# Patient Record
Sex: Female | Born: 1955 | Race: White | Hispanic: No | Marital: Single | Smoking: Never smoker
Health system: Southern US, Community
[De-identification: ages and names within clinical notes are randomized; demographics above are authoritative.]

## PROBLEM LIST (undated history)

## (undated) DIAGNOSIS — E119 Type 2 diabetes mellitus without complications: Secondary | ICD-10-CM

## (undated) DIAGNOSIS — N200 Calculus of kidney: Secondary | ICD-10-CM

## (undated) DIAGNOSIS — K219 Gastro-esophageal reflux disease without esophagitis: Secondary | ICD-10-CM

## (undated) DIAGNOSIS — R251 Tremor, unspecified: Secondary | ICD-10-CM

## (undated) DIAGNOSIS — R413 Other amnesia: Secondary | ICD-10-CM

## (undated) DIAGNOSIS — M810 Age-related osteoporosis without current pathological fracture: Secondary | ICD-10-CM

## (undated) DIAGNOSIS — M1990 Unspecified osteoarthritis, unspecified site: Secondary | ICD-10-CM

## (undated) DIAGNOSIS — K7460 Unspecified cirrhosis of liver: Secondary | ICD-10-CM

## (undated) DIAGNOSIS — T7840XA Allergy, unspecified, initial encounter: Secondary | ICD-10-CM

## (undated) DIAGNOSIS — J449 Chronic obstructive pulmonary disease, unspecified: Secondary | ICD-10-CM

## (undated) DIAGNOSIS — G309 Alzheimer's disease, unspecified: Principal | ICD-10-CM

## (undated) DIAGNOSIS — F0280 Dementia in other diseases classified elsewhere without behavioral disturbance: Principal | ICD-10-CM

## (undated) DIAGNOSIS — R0683 Snoring: Secondary | ICD-10-CM

## (undated) DIAGNOSIS — I639 Cerebral infarction, unspecified: Secondary | ICD-10-CM

## (undated) DIAGNOSIS — Z87442 Personal history of urinary calculi: Secondary | ICD-10-CM

## (undated) DIAGNOSIS — M722 Plantar fascial fibromatosis: Secondary | ICD-10-CM

## (undated) DIAGNOSIS — E049 Nontoxic goiter, unspecified: Secondary | ICD-10-CM

## (undated) DIAGNOSIS — I10 Essential (primary) hypertension: Secondary | ICD-10-CM

## (undated) DIAGNOSIS — R42 Dizziness and giddiness: Secondary | ICD-10-CM

## (undated) DIAGNOSIS — F0150 Vascular dementia without behavioral disturbance: Secondary | ICD-10-CM

## (undated) DIAGNOSIS — G4719 Other hypersomnia: Secondary | ICD-10-CM

## (undated) HISTORY — DX: Dementia in other diseases classified elsewhere without behavioral disturbance: F02.80

## (undated) HISTORY — DX: Vascular dementia without behavioral disturbance: F01.50

## (undated) HISTORY — DX: Age-related osteoporosis without current pathological fracture: M81.0

## (undated) HISTORY — DX: Allergy, unspecified, initial encounter: T78.40XA

## (undated) HISTORY — PX: BLADDER SURGERY: SHX569

## (undated) HISTORY — DX: Other amnesia: R41.3

## (undated) HISTORY — DX: Alzheimer's disease, unspecified: G30.9

## (undated) HISTORY — PX: APPENDECTOMY: SHX54

## (undated) HISTORY — DX: Plantar fascial fibromatosis: M72.2

## (undated) HISTORY — DX: Nontoxic goiter, unspecified: E04.9

## (undated) HISTORY — DX: Unspecified osteoarthritis, unspecified site: M19.90

## (undated) HISTORY — PX: CHOLECYSTECTOMY: SHX55

## (undated) HISTORY — PX: TOTAL ABDOMINAL HYSTERECTOMY: SHX209

## (undated) HISTORY — DX: Other hypersomnia: G47.19

## (undated) HISTORY — PX: FOOT SURGERY: SHX648

## (undated) HISTORY — DX: Gastro-esophageal reflux disease without esophagitis: K21.9

## (undated) HISTORY — DX: Dizziness and giddiness: R42

## (undated) HISTORY — DX: Snoring: R06.83

## (undated) HISTORY — DX: Tremor, unspecified: R25.1

---

## 1998-01-16 ENCOUNTER — Encounter: Admission: RE | Admit: 1998-01-16 | Discharge: 1998-01-16 | Payer: Self-pay | Admitting: Family Medicine

## 1998-01-23 ENCOUNTER — Encounter: Admission: RE | Admit: 1998-01-23 | Discharge: 1998-01-23 | Payer: Self-pay | Admitting: Family Medicine

## 1998-04-03 ENCOUNTER — Other Ambulatory Visit: Admission: RE | Admit: 1998-04-03 | Discharge: 1998-04-03 | Payer: Self-pay | Admitting: Family Medicine

## 1998-04-03 ENCOUNTER — Encounter: Admission: RE | Admit: 1998-04-03 | Discharge: 1998-04-03 | Payer: Self-pay | Admitting: Family Medicine

## 1999-05-21 ENCOUNTER — Encounter: Admission: RE | Admit: 1999-05-21 | Discharge: 1999-05-21 | Payer: Self-pay | Admitting: Sports Medicine

## 1999-05-22 ENCOUNTER — Ambulatory Visit (HOSPITAL_COMMUNITY): Admission: RE | Admit: 1999-05-22 | Discharge: 1999-05-22 | Payer: Self-pay | Admitting: Family Medicine

## 1999-06-04 ENCOUNTER — Encounter: Admission: RE | Admit: 1999-06-04 | Discharge: 1999-06-04 | Payer: Self-pay | Admitting: Family Medicine

## 1999-07-03 ENCOUNTER — Encounter: Admission: RE | Admit: 1999-07-03 | Discharge: 1999-07-03 | Payer: Self-pay | Admitting: Sports Medicine

## 1999-07-03 ENCOUNTER — Encounter: Payer: Self-pay | Admitting: Sports Medicine

## 1999-07-12 ENCOUNTER — Encounter: Payer: Self-pay | Admitting: Sports Medicine

## 1999-07-12 ENCOUNTER — Encounter: Admission: RE | Admit: 1999-07-12 | Discharge: 1999-07-12 | Payer: Self-pay | Admitting: Sports Medicine

## 1999-07-30 ENCOUNTER — Encounter: Admission: RE | Admit: 1999-07-30 | Discharge: 1999-07-30 | Payer: Self-pay | Admitting: Family Medicine

## 1999-09-17 ENCOUNTER — Encounter: Admission: RE | Admit: 1999-09-17 | Discharge: 1999-09-17 | Payer: Self-pay | Admitting: Sports Medicine

## 1999-10-08 ENCOUNTER — Encounter: Admission: RE | Admit: 1999-10-08 | Discharge: 1999-10-08 | Payer: Self-pay | Admitting: Family Medicine

## 1999-10-08 ENCOUNTER — Other Ambulatory Visit: Admission: RE | Admit: 1999-10-08 | Discharge: 1999-10-08 | Payer: Self-pay | Admitting: Family Medicine

## 1999-10-15 ENCOUNTER — Encounter: Admission: RE | Admit: 1999-10-15 | Discharge: 1999-10-15 | Payer: Self-pay | Admitting: Sports Medicine

## 2000-01-27 ENCOUNTER — Emergency Department (HOSPITAL_COMMUNITY): Admission: EM | Admit: 2000-01-27 | Discharge: 2000-01-27 | Payer: Self-pay | Admitting: Emergency Medicine

## 2000-01-27 ENCOUNTER — Encounter: Payer: Self-pay | Admitting: Emergency Medicine

## 2000-01-28 ENCOUNTER — Encounter: Admission: RE | Admit: 2000-01-28 | Discharge: 2000-01-28 | Payer: Self-pay | Admitting: Family Medicine

## 2000-01-28 ENCOUNTER — Ambulatory Visit: Admission: RE | Admit: 2000-01-28 | Discharge: 2000-01-28 | Payer: Self-pay | Admitting: Family Medicine

## 2000-03-03 ENCOUNTER — Encounter: Admission: RE | Admit: 2000-03-03 | Discharge: 2000-03-03 | Payer: Self-pay | Admitting: Family Medicine

## 2000-03-10 ENCOUNTER — Encounter: Admission: RE | Admit: 2000-03-10 | Discharge: 2000-03-10 | Payer: Self-pay | Admitting: Family Medicine

## 2000-03-10 ENCOUNTER — Encounter: Payer: Self-pay | Admitting: Family Medicine

## 2000-04-25 ENCOUNTER — Emergency Department (HOSPITAL_COMMUNITY): Admission: EM | Admit: 2000-04-25 | Discharge: 2000-04-25 | Payer: Self-pay | Admitting: Emergency Medicine

## 2000-07-24 ENCOUNTER — Encounter: Admission: RE | Admit: 2000-07-24 | Discharge: 2000-07-24 | Payer: Self-pay | Admitting: Family Medicine

## 2000-08-23 ENCOUNTER — Emergency Department (HOSPITAL_COMMUNITY): Admission: EM | Admit: 2000-08-23 | Discharge: 2000-08-23 | Payer: Self-pay | Admitting: Emergency Medicine

## 2000-08-25 ENCOUNTER — Encounter: Admission: RE | Admit: 2000-08-25 | Discharge: 2000-08-25 | Payer: Self-pay | Admitting: Sports Medicine

## 2000-09-08 ENCOUNTER — Encounter: Admission: RE | Admit: 2000-09-08 | Discharge: 2000-09-08 | Payer: Self-pay | Admitting: *Deleted

## 2000-09-08 ENCOUNTER — Encounter: Admission: RE | Admit: 2000-09-08 | Discharge: 2000-09-08 | Payer: Self-pay | Admitting: Sports Medicine

## 2001-01-01 ENCOUNTER — Encounter: Admission: RE | Admit: 2001-01-01 | Discharge: 2001-01-01 | Payer: Self-pay | Admitting: Family Medicine

## 2001-02-10 ENCOUNTER — Encounter: Payer: Self-pay | Admitting: General Surgery

## 2001-02-10 ENCOUNTER — Encounter: Admission: RE | Admit: 2001-02-10 | Discharge: 2001-02-10 | Payer: Self-pay | Admitting: General Surgery

## 2001-02-23 ENCOUNTER — Encounter: Payer: Self-pay | Admitting: Family Medicine

## 2001-02-23 ENCOUNTER — Encounter: Admission: RE | Admit: 2001-02-23 | Discharge: 2001-02-23 | Payer: Self-pay | Admitting: Sports Medicine

## 2001-03-01 ENCOUNTER — Encounter: Payer: Self-pay | Admitting: Family Medicine

## 2001-03-01 ENCOUNTER — Encounter: Admission: RE | Admit: 2001-03-01 | Discharge: 2001-03-01 | Payer: Self-pay | Admitting: Family Medicine

## 2001-03-20 ENCOUNTER — Emergency Department (HOSPITAL_COMMUNITY): Admission: EM | Admit: 2001-03-20 | Discharge: 2001-03-20 | Payer: Self-pay | Admitting: *Deleted

## 2001-04-21 ENCOUNTER — Emergency Department (HOSPITAL_COMMUNITY): Admission: EM | Admit: 2001-04-21 | Discharge: 2001-04-21 | Payer: Self-pay | Admitting: *Deleted

## 2001-08-10 ENCOUNTER — Encounter: Admission: RE | Admit: 2001-08-10 | Discharge: 2001-08-10 | Payer: Self-pay | Admitting: Family Medicine

## 2001-09-15 ENCOUNTER — Emergency Department (HOSPITAL_COMMUNITY): Admission: EM | Admit: 2001-09-15 | Discharge: 2001-09-16 | Payer: Self-pay | Admitting: Emergency Medicine

## 2001-09-16 ENCOUNTER — Encounter: Payer: Self-pay | Admitting: Emergency Medicine

## 2001-10-13 ENCOUNTER — Emergency Department (HOSPITAL_COMMUNITY): Admission: EM | Admit: 2001-10-13 | Discharge: 2001-10-13 | Payer: Self-pay | Admitting: Emergency Medicine

## 2001-10-13 ENCOUNTER — Encounter: Payer: Self-pay | Admitting: Emergency Medicine

## 2002-04-20 ENCOUNTER — Emergency Department (HOSPITAL_COMMUNITY): Admission: EM | Admit: 2002-04-20 | Discharge: 2002-04-20 | Payer: Self-pay | Admitting: *Deleted

## 2002-04-20 ENCOUNTER — Encounter: Payer: Self-pay | Admitting: *Deleted

## 2002-04-20 ENCOUNTER — Ambulatory Visit (HOSPITAL_COMMUNITY): Admission: RE | Admit: 2002-04-20 | Discharge: 2002-04-20 | Payer: Self-pay | Admitting: *Deleted

## 2003-01-17 ENCOUNTER — Encounter: Admission: RE | Admit: 2003-01-17 | Discharge: 2003-01-17 | Payer: Self-pay | Admitting: Family Medicine

## 2003-05-18 ENCOUNTER — Encounter: Admission: RE | Admit: 2003-05-18 | Discharge: 2003-05-18 | Payer: Self-pay | Admitting: Family Medicine

## 2003-05-19 ENCOUNTER — Emergency Department (HOSPITAL_COMMUNITY): Admission: EM | Admit: 2003-05-19 | Discharge: 2003-05-20 | Payer: Self-pay | Admitting: Emergency Medicine

## 2004-03-04 ENCOUNTER — Inpatient Hospital Stay (HOSPITAL_COMMUNITY): Admission: EM | Admit: 2004-03-04 | Discharge: 2004-03-06 | Payer: Self-pay | Admitting: Emergency Medicine

## 2004-03-04 ENCOUNTER — Ambulatory Visit: Payer: Self-pay | Admitting: Family Medicine

## 2004-09-20 ENCOUNTER — Inpatient Hospital Stay (HOSPITAL_COMMUNITY): Admission: RE | Admit: 2004-09-20 | Discharge: 2004-09-22 | Payer: Self-pay | Admitting: Gynecology

## 2004-11-19 ENCOUNTER — Ambulatory Visit: Payer: Self-pay | Admitting: Family Medicine

## 2004-12-02 ENCOUNTER — Encounter: Admission: RE | Admit: 2004-12-02 | Discharge: 2004-12-02 | Payer: Self-pay | Admitting: Family Medicine

## 2005-03-18 ENCOUNTER — Emergency Department (HOSPITAL_COMMUNITY): Admission: EM | Admit: 2005-03-18 | Discharge: 2005-03-18 | Payer: Self-pay | Admitting: Emergency Medicine

## 2005-03-26 ENCOUNTER — Encounter (INDEPENDENT_AMBULATORY_CARE_PROVIDER_SITE_OTHER): Payer: Self-pay | Admitting: *Deleted

## 2005-03-26 LAB — CONVERTED CEMR LAB

## 2005-04-10 ENCOUNTER — Ambulatory Visit: Payer: Self-pay | Admitting: Family Medicine

## 2005-08-14 ENCOUNTER — Ambulatory Visit: Payer: Self-pay | Admitting: Family Medicine

## 2005-08-17 ENCOUNTER — Emergency Department (HOSPITAL_COMMUNITY): Admission: EM | Admit: 2005-08-17 | Discharge: 2005-08-17 | Payer: Self-pay | Admitting: Emergency Medicine

## 2005-08-18 ENCOUNTER — Encounter: Admission: RE | Admit: 2005-08-18 | Discharge: 2005-08-18 | Payer: Self-pay | Admitting: Family Medicine

## 2005-08-29 ENCOUNTER — Encounter: Admission: RE | Admit: 2005-08-29 | Discharge: 2005-08-29 | Payer: Self-pay | Admitting: General Surgery

## 2005-09-05 ENCOUNTER — Encounter: Admission: RE | Admit: 2005-09-05 | Discharge: 2005-09-05 | Payer: Self-pay | Admitting: General Surgery

## 2005-09-29 ENCOUNTER — Ambulatory Visit (HOSPITAL_COMMUNITY): Admission: RE | Admit: 2005-09-29 | Discharge: 2005-09-30 | Payer: Self-pay | Admitting: General Surgery

## 2005-09-29 ENCOUNTER — Encounter (INDEPENDENT_AMBULATORY_CARE_PROVIDER_SITE_OTHER): Payer: Self-pay | Admitting: *Deleted

## 2005-11-27 ENCOUNTER — Ambulatory Visit: Payer: Self-pay | Admitting: Family Medicine

## 2005-12-09 ENCOUNTER — Encounter: Admission: RE | Admit: 2005-12-09 | Discharge: 2005-12-12 | Payer: Self-pay | Admitting: Family Medicine

## 2006-06-25 ENCOUNTER — Ambulatory Visit: Payer: Self-pay | Admitting: Family Medicine

## 2006-09-17 DIAGNOSIS — E010 Iodine-deficiency related diffuse (endemic) goiter: Secondary | ICD-10-CM | POA: Insufficient documentation

## 2006-09-17 DIAGNOSIS — K219 Gastro-esophageal reflux disease without esophagitis: Secondary | ICD-10-CM | POA: Insufficient documentation

## 2006-09-17 DIAGNOSIS — E049 Nontoxic goiter, unspecified: Secondary | ICD-10-CM | POA: Insufficient documentation

## 2006-09-18 ENCOUNTER — Encounter (INDEPENDENT_AMBULATORY_CARE_PROVIDER_SITE_OTHER): Payer: Self-pay | Admitting: *Deleted

## 2007-01-01 ENCOUNTER — Encounter: Payer: Self-pay | Admitting: Family Medicine

## 2007-01-01 ENCOUNTER — Ambulatory Visit: Payer: Self-pay | Admitting: Family Medicine

## 2007-07-01 ENCOUNTER — Telehealth: Payer: Self-pay | Admitting: Family Medicine

## 2007-11-26 ENCOUNTER — Encounter: Payer: Self-pay | Admitting: Family Medicine

## 2007-11-26 ENCOUNTER — Ambulatory Visit: Payer: Self-pay | Admitting: Family Medicine

## 2007-11-26 LAB — CONVERTED CEMR LAB
Chloride: 105 meq/L (ref 96–112)
Creatinine, Ser: 0.73 mg/dL (ref 0.40–1.20)

## 2009-02-13 ENCOUNTER — Encounter: Payer: Self-pay | Admitting: Family Medicine

## 2009-04-03 ENCOUNTER — Encounter (INDEPENDENT_AMBULATORY_CARE_PROVIDER_SITE_OTHER): Payer: Self-pay | Admitting: *Deleted

## 2009-08-10 ENCOUNTER — Encounter: Payer: Self-pay | Admitting: Family Medicine

## 2009-08-10 ENCOUNTER — Ambulatory Visit: Payer: Self-pay | Admitting: Family Medicine

## 2009-08-10 LAB — CONVERTED CEMR LAB
ALT: 14 units/L (ref 0–35)
AST: 14 units/L (ref 0–37)
Albumin: 4.3 g/dL (ref 3.5–5.2)
BUN: 14 mg/dL (ref 6–23)
Calcium: 8.8 mg/dL (ref 8.4–10.5)
Chloride: 103 meq/L (ref 96–112)
Potassium: 4 meq/L (ref 3.5–5.3)
Sodium: 141 meq/L (ref 135–145)
Total Protein: 7.4 g/dL (ref 6.0–8.3)

## 2009-08-13 ENCOUNTER — Encounter (INDEPENDENT_AMBULATORY_CARE_PROVIDER_SITE_OTHER): Payer: Self-pay | Admitting: *Deleted

## 2009-08-13 ENCOUNTER — Encounter: Payer: Self-pay | Admitting: Family Medicine

## 2009-11-26 ENCOUNTER — Encounter: Payer: Self-pay | Admitting: Family Medicine

## 2010-01-02 ENCOUNTER — Telehealth: Payer: Self-pay | Admitting: Family Medicine

## 2010-02-17 ENCOUNTER — Emergency Department (HOSPITAL_COMMUNITY): Admission: EM | Admit: 2010-02-17 | Discharge: 2010-02-17 | Payer: Self-pay | Admitting: Emergency Medicine

## 2010-02-25 ENCOUNTER — Telehealth: Payer: Self-pay | Admitting: *Deleted

## 2010-03-04 ENCOUNTER — Telehealth: Payer: Self-pay | Admitting: Family Medicine

## 2010-03-07 ENCOUNTER — Telehealth: Payer: Self-pay | Admitting: Family Medicine

## 2010-04-26 ENCOUNTER — Encounter: Payer: Self-pay | Admitting: *Deleted

## 2010-04-26 ENCOUNTER — Ambulatory Visit: Payer: Self-pay | Admitting: Family Medicine

## 2010-04-26 DIAGNOSIS — G894 Chronic pain syndrome: Secondary | ICD-10-CM | POA: Insufficient documentation

## 2010-04-26 LAB — CONVERTED CEMR LAB
Glucose, Urine, Semiquant: NEGATIVE
Nitrite: NEGATIVE
Protein, U semiquant: NEGATIVE
Urobilinogen, UA: 0.2
WBC Urine, dipstick: NEGATIVE

## 2010-05-07 ENCOUNTER — Encounter: Payer: Self-pay | Admitting: Family Medicine

## 2010-05-07 LAB — CONVERTED CEMR LAB
OCCULT 1: NEGATIVE
OCCULT 2: NEGATIVE
OCCULT 3: NEGATIVE

## 2010-05-17 ENCOUNTER — Ambulatory Visit: Payer: Self-pay | Admitting: Family Medicine

## 2010-05-17 DIAGNOSIS — M47816 Spondylosis without myelopathy or radiculopathy, lumbar region: Secondary | ICD-10-CM | POA: Insufficient documentation

## 2010-05-17 DIAGNOSIS — N952 Postmenopausal atrophic vaginitis: Secondary | ICD-10-CM | POA: Insufficient documentation

## 2010-06-04 ENCOUNTER — Encounter: Payer: Self-pay | Admitting: Family Medicine

## 2010-06-04 ENCOUNTER — Encounter: Admission: RE | Admit: 2010-06-04 | Discharge: 2010-06-04 | Payer: Self-pay | Admitting: Family Medicine

## 2010-06-10 ENCOUNTER — Encounter: Payer: Self-pay | Admitting: Family Medicine

## 2010-06-10 DIAGNOSIS — N812 Incomplete uterovaginal prolapse: Secondary | ICD-10-CM | POA: Insufficient documentation

## 2010-06-10 DIAGNOSIS — N312 Flaccid neuropathic bladder, not elsewhere classified: Secondary | ICD-10-CM | POA: Insufficient documentation

## 2010-06-10 HISTORY — DX: Incomplete uterovaginal prolapse: N81.2

## 2010-06-11 ENCOUNTER — Encounter: Payer: Self-pay | Admitting: Family Medicine

## 2010-08-13 ENCOUNTER — Telehealth: Payer: Self-pay | Admitting: Family Medicine

## 2010-08-20 NOTE — Progress Notes (Signed)
Summary: refill  Phone Note Refill Request Call back at Home Phone [phone] Message from:  Patient  Refills Requested: Medication #1:  NEURONTIN 800 MG TABS Take 1 tablet by mouth three times a day [BMN] [facility] [address] [facility] [place]  Initial call taken by: De Nurse,  January 02, 2010 8:46 AM Caller: Patient Initial call taken by: De Nurse,  January 02, 2010 8:45 AM    Prescriptions: NEURONTIN 800 MG TABS (GABAPENTIN) Take 1 tablet by mouth three times a day  #90 x 6   Entered and Authorized by:   Luretha Murphy NP   Signed by:   Luretha Murphy NP on 01/02/2010   Method used:   Electronically to        [facility]  [address] [address] [phone][facility] (retail)       [address]       [place], [place]  [address]       Ph: [phone] or [phone]       Fax: [phone]   RxID:   [id number]

## 2010-08-20 NOTE — Letter (Signed)
Summary: Generic Letter  [facility]  [address]   [place], [place] [address]   Phone: [phone]  Fax: [phone]    06/11/2010  DENESE MENTINK [address] [place], [place]  [address]  Dear Ms. Heron,  Your bone density scan showed thinning of your hip bone that is considered osteopenia.  We will need to follow this every 2 years.  Please make sure you are taking a calcium and vitamin D supplement, two times per day.  Continue to walk for exerecise.         Sincerely,   Luretha Murphy NP  Appended Document: Generic Letter letter mailed

## 2010-08-20 NOTE — Letter (Signed)
Summary: Generic Letter/referral follow up  [facility]  [address]   [place], [place] [address]   Phone: [phone]  Fax: [phone]    11/26/2009  [name] [address] [place], [place]  [address]  Dear Ms. Vanmaanen,   I was reviewing my referrals and noted that you did not go for the colonoscopy visit.  This is to inform you that I beleive this is an important screening procedure and is recommended at age 52. This test is to look for early colon cancer.  If you chose not to have the procedure then me must at least check your stool for blood every year.  We will discuss at your next visit.        Sincerely,   Luretha Murphy NP  Appended Document: Generic Letter/referral follow up mailed.

## 2010-08-20 NOTE — Assessment & Plan Note (Signed)
Summary: bladder problem,tcb   Vital Signs:  Patient profile:   55 year old female Weight:      215 pounds Temp:     97.8 degrees F oral Pulse rate:   90 / minute Pulse rhythm:   regular BP sitting:   143 / 89  (left arm) Cuff size:   large  Vitals Entered By: Arlyss Repress CMA, (April 26, 2010 4:21 PM)   History of Present Illness: [name] feels that her bladder has fallen again, she had 2 repairs for cystocele.  She describes pain and feeling of pressure.  She is unable to have sex. (TAH with BSO, never on hormone replacement)  She also talks about "chronic pain' that has been a problem for years, we have treated wtih gabapentin and tramadol.  She does not feel that they are effective and would like something else.  Her back hurts, her head hurts, her arms hurt, her legs hurt.  Mostly at the end of a work day.  Her sleep patterns are interrupted.  Bowels move once a week and it is difficult.  Habits & Providers  Alcohol-Tobacco-Diet     Tobacco Status: never  Current Medications (verified): 1)  Flexeril 10 Mg  Tabs (Cyclobenzaprine Hcl) .... Three Times A Day As Needed 2)  Neurontin 800 Mg Tabs (Gabapentin) .... Take 1 Tablet By Mouth Three Times A Day 3)  Omeprazole 40 Mg Cpdr (Omeprazole) .... Take One Tablet Daily At Bedtime 4)  Buffered Aspirin 325 Mg  Tabs (Aspirin Buf(Cacarb-Mgcarb-Mgo)) .... Daily 5)  Tramadol Hcl 50 Mg Tabs (Tramadol Hcl) .... Two Tabs Two Times A Day As Needed Pain 6)  Meloxicam 15 Mg Tabs (Meloxicam) .... One Daily As Needed (Take in Bursts So When The Pain Is Bad Take For 3-5 Days Then Take A Break) 7)  Polyethylene Glycol 3350  Powd (Polyethylene Glycol 3350) .Marland KitchenMarland KitchenMarland Kitchen 17 Grams in Water Daily To Prevent Constipation, Qs  Allergies (verified): No Known Drug Allergies  Review of Systems      See HPI General:  Complains of malaise and sleep disorder; denies fatigue and fever. CV:  Denies chest pain or discomfort and swelling of feet. GI:  Complains of  constipation, gas, and indigestion. GU:  Complains of dysuria. MS:  Denies joint pain, low back pain, and mid back pain.  Physical Exam  General:  Anxious, red in the face, talking rapidly Lungs:  normal respiratory effort and normal breath sounds.   Heart:  normal rate, regular rhythm, and no murmur.   Abdomen:  soft and non-tender.   Genitalia:  recessed pelvic floow, thin friable tissue, able to palpate bladder through vaginal os. Msk:  normal ROM, no joint tenderness, no joint swelling, and no joint warmth.   Skin:  flushed face Psych:  moderately anxious.     Impression & Recommendations:  Problem # 1:  DYSURIA (ICD-788.1) normal UA, 2 cystocele repairs consider pessary, tissue is very thin and weak, do not think she can undergo another surgery Orders: Urinalysis-[facility] (00000) [facility]- Est  Level 4 ([address])  Problem # 2:  CHRONIC PAIN SYNDROME (ICD-338.4) increase tramadol, add NSAID in bursts, add acetaminophen Orders: [facility]- Est  Level 4 ([address])  Problem # 3:  CONSTIPATION (ICD-564.00)  Her updated medication list for this problem includes:    Polyethylene Glycol 3350 Powd (Polyethylene glycol 3350) .Marland KitchenMarland KitchenMarland KitchenMarland Kitchen 17 grams in water daily to prevent constipation, qs  Orders: [facility]- Est  Level 4 ([address])  Problem # 4:  GASTROESOPHAGEAL REFLUX, NO  ESOPHAGITIS (ICD-530.81) Have been having problems with insurance company covering PPI, call and recived PA for one year. Her updated medication list for this problem includes:    Omeprazole 40 Mg Cpdr (Omeprazole) .Marland Kitchen... Take one tablet daily at bedtime  Orders: [facility]- Est  Level 4 ([address])  Complete Medication List: 1)  Flexeril 10 Mg Tabs (Cyclobenzaprine hcl) .... Three times a day as needed 2)  Neurontin 800 Mg Tabs (Gabapentin) .... Take 1 tablet by mouth three times a day 3)  Omeprazole 40 Mg Cpdr (Omeprazole) .... Take one tablet daily at bedtime 4)  Buffered Aspirin 325 Mg Tabs (Aspirin buf(cacarb-mgcarb-mgo)) .... Daily 5)  Tramadol  Hcl 50 Mg Tabs (Tramadol hcl) .... Two tabs two times a day as needed pain 6)  Meloxicam 15 Mg Tabs (Meloxicam) .... One daily as needed (take in bursts so when the pain is bad take for 3-5 days then take a break) 7)  Polyethylene Glycol 3350 Powd (Polyethylene glycol 3350) .Marland KitchenMarland KitchenMarland Kitchen 17 grams in water daily to prevent constipation, qs  Patient Instructions: 1)  Take tyenol 2 tabs with 2 tabs of tramadol two times a day  2)  Take meloxicam for arthritis in bursts 3)  Pessary=GYN tell them you had(surgery twice for bladder prolapse and interested in a pessary as you feel your bladder falling again) 4)  Polyethylene glycol for consitpation 5)  clotrimazole for rash in skin folds 6)  Mammogram and stool slides for blood are due 7)  Call insurance company about colonoscopy payment Prescriptions: POLYETHYLENE GLYCOL 3350  POWD (POLYETHYLENE GLYCOL 3350) 17 grams in water daily to prevent constipation, QS Brand medically necessary #1 x 3   Entered and Authorized by:   Luretha Murphy NP   Signed by:   Luretha Murphy NP on 04/26/2010   Method used:   Print then Give to Patient   RxID:   [id number] NEURONTIN 800 MG TABS (GABAPENTIN) Take 1 tablet by mouth three times a day Brand medically necessary #90 x 6   Entered and Authorized by:   Luretha Murphy NP   Signed by:   Luretha Murphy NP on 04/26/2010   Method used:   Print then Give to Patient   RxID:   [id number] FLEXERIL 10 MG  TABS (CYCLOBENZAPRINE HCL) three times a day as needed Brand medically necessary #90 x 3   Entered and Authorized by:   Luretha Murphy NP   Signed by:   Luretha Murphy NP on 04/26/2010   Method used:   Print then Give to Patient   RxID:   [id number] OMEPRAZOLE 40 MG CPDR (OMEPRAZOLE) Take one tablet daily at bedtime Brand medically necessary #30 x 6   Entered and Authorized by:   Luretha Murphy NP   Signed by:   Luretha Murphy NP on 04/26/2010   Method used:   Print then Give to Patient   RxID:   [id number] MELOXICAM 15 MG  TABS (MELOXICAM) one daily as needed (take in bursts so when the pain is bad take for 3-5 days then take a break) Brand medically necessary #30 x 3   Entered and Authorized by:   Luretha Murphy NP   Signed by:   Luretha Murphy NP on 04/26/2010   Method used:   Print then Give to Patient   RxID:   [id number] TRAMADOL HCL 50 MG TABS (TRAMADOL HCL) two tabs two times a day as needed pain Brand medically necessary #120 x 3   Entered and Authorized by:   Luretha Murphy  NP   Signed by:   Luretha Murphy NP on 04/26/2010   Method used:   Print then Give to Patient   RxID:   [id number]    Prevention & Chronic Care Immunizations   Influenza vaccine: Not documented   Influenza vaccine deferral: Refused  (08/10/2009)    Tetanus booster: 12/20/2002: Done.    Pneumococcal vaccine: Not documented  Colorectal Screening   Hemoccult: Not documented   Hemoccult action/deferral: Not indicated  (08/10/2009)    Colonoscopy: Not documented   Colonoscopy action/deferral: GI referral  (08/10/2009)  Other Screening   Pap smear: Done.  (03/26/2005)   Pap smear action/deferral: Not indicated S/P hysterectomy  (02/13/2009)    Mammogram: Done.  (11/23/2004)   Mammogram action/deferral: Ordered  (08/10/2009)   Smoking status: never  (04/26/2010)  Lipids   Total Cholesterol: Not documented   LDL: Not documented   LDL Direct: 103  (08/10/2009)   HDL: Not documented   Triglycerides: Not documented  Laboratory Results   Urine Tests  Date/Time Received: April 26, 2010 4:24 PM  Date/Time Reported: April 26, 2010 4:44 PM   Routine Urinalysis   Color: yellow Appearance: Clear Glucose: negative   (Normal Range: Negative) Bilirubin: small;  reflex ictotest = negative   (Normal Range: Negative) Ketone: trace (5)   (Normal Range: Negative) Spec. Gravity: >=1.030   (Normal Range: 1.003-1.035) Blood: trace-intact   (Normal Range: Negative) pH: 5.5   (Normal Range: 5.0-8.0) Protein: negative    (Normal Range: Negative) Urobilinogen: 0.2   (Normal Range: 0-1) Nitrite: negative   (Normal Range: Negative) Leukocyte Esterace: negative   (Normal Range: Negative)  Urine Microscopic WBC/HPF: 4-8 Bacteria/HPF: 2+ Mucous/HPF: 3+ Epithelial/HPF: 10-20 Crystals/HPF: calcium oxalate    Comments: ...............test performed by......Marland KitchenBonnie A. [place], MLS (ASCP)cm

## 2010-08-20 NOTE — Progress Notes (Signed)
Summary: re: meds/ts  Phone Note Call from Patient Call back at [facility] Phone [phone]   Caller: Patient Summary of Call: having problems with Omeprizole getting PA   [facility] [place] Initial call taken by: De Nurse,  February 25, 2010 4:29 PM  Follow-up for Phone Call        called pt advised pt to call her insurance co. and find out, which meds would be covered through her plan and then call us back. pt agreed and will do that.   Follow-up by: Arlyss Repress CMA,,  February 25, 2010 4:43 PM

## 2010-08-20 NOTE — Progress Notes (Signed)
Summary: phn msg  Phone Note Call from Patient Call back at [phone]   Caller: Patient Summary of Call: pt states that her ins co stated that the doctor has to call to precertify in order for them to approve  the omeprozol (pt is having a very hard time getting this medicine) pls call [phone]  Initial call taken by: De Nurse,  March 07, 2010 1:59 PM  Follow-up for Phone Call        called [employer]. they require a prior auth on all PPIs. the prior auth form is in pcp chart box Follow-up by: Golden Circle RN,  March 07, 2010 4:49 PM  Additional Follow-up for Phone Call Additional follow up Details #1::        I'll pass this on to [place]. I believe the high dose requires a history of of gastric ulcer which I didn't see. The 20 mg dose is available without an rx Additional Follow-up by: Zachery Dauer MD,  March 07, 2010 6:03 PM     Appended Document: phn msg I have resent the PA form in 3 times and have lowered the dosage to 20 mg as indicated for the treatment of GERD.  If her insurance still refuses she can purchase OTC or switch to an H2 blocker that is less costly.

## 2010-08-20 NOTE — Assessment & Plan Note (Signed)
Summary: cpe,df   Vital Signs:  Patient profile:   55 year old female Height:      65 inches Weight:      228 pounds BMI:     38.08 Temp:     97.8 degrees F oral Pulse rate:   84 / minute BP sitting:   130 / 80  (right arm) Cuff size:   large  Vitals Entered By: Tessie Fass CMA (August 10, 2009 2:50 PM) CC: complete physical Is Patient Diabetic? No   CC:  complete physical.  History of Present Illness: Has not been in for over one year.  Still having ongoing chronic pain involving back, legs and feet.  Feels that the neurotin is helping her to cope with the pain but not take it away.  Often so uncomfortable that it takes several hours for her to fall asleep.  She is still working full time, wonders how long she can keep it up.  Gets real tired, worries about diabetes.  Feels as if her hearing is poor, cannot hear well out of her right ear and her ear is tender.  Has bad indigestion, after eating and at night, has not been taking her PPI.  Discussed prevention PCMH areas.  Habits & Providers  Alcohol-Tobacco-Diet     Tobacco Status: never  Current Medications (verified): 1)  Flexeril 10 Mg  Tabs (Cyclobenzaprine Hcl) .... Three Times A Day As Needed 2)  Neurontin 800 Mg Tabs (Gabapentin) .... Take 1 Tablet By Mouth Three Times A Day 3)  Omeprazole 20 Mg Cpdr (Omeprazole) .... Take 1 Capsule By Mouth Once A Day 4)  Buffered Aspirin 325 Mg  Tabs (Aspirin Buf(Cacarb-Mgcarb-Mgo)) .... Daily 5)  Tramadol Hcl 50 Mg Tabs (Tramadol Hcl) .... One To Two Tabs Two Times A Day As Needed Pain  Allergies (verified): No Known Drug Allergies  Social History: Smoking Status:  never  Review of Systems General:  Denies fatigue, malaise, and sleep disorder. ENT:  Complains of earache. CV:  Denies chest pain or discomfort. GI:  Denies constipation. GU:  Denies discharge, dysuria, and urinary frequency. MS:  Complains of joint pain, low back pain, and cramps.  Physical  Exam  General:  Alert, obese Ears:  Right canal tender with a little crusting, normal TM.  Audiometer: heard all frequencies except 4000 on the left on 25db Mouth:  poor dentition, missing teeth, ones there are in poor condition Lungs:  normal respiratory effort and normal breath sounds.   Heart:  normal rate and regular rhythm.   Abdomen:  soft and normal bowel sounds.   Msk:  normal ROM.   Extremities:  1+ left pedal edema and 1+ right pedal edema.   Callouses at the fifth mt heads bilaterally Psych:  normally interactive.     Impression & Recommendations:  Problem # 1:  SCREENING, COLON CANCER (ICD-V76.51)  Orders: Gastroenterology Referral (GI)  Problem # 2:  SCREENING OTHER&UNSPEC CARDIOVASCULAR CONDITIONS (ICD-V81.2)  Orders: Direct LDL-[facility] ([address]) [facility]- Est  Level 4 ([address])  Problem # 3:  FATIGUE (ICD-780.79) Check hepatic, renal, and glucose.   Orders: Comp Met-[facility] [phone]) TSH-[facility] ([address]) [facility]- Est  Level 4 ([address])  Problem # 4:  PAIN, GENERALIZED (ICD-780.9)  Add tramadol to gabapentin and flexeril  Orders: [facility]- Est  Level 4 ([address])  Problem # 5:  GASTROESOPHAGEAL REFLUX, NO ESOPHAGITIS (ICD-530.81)  Her updated medication list for this problem includes:    Omeprazole 20 Mg Cpdr (Omeprazole) .Marland Kitchen... Take 1 capsule by mouth once  a day  Problem # 6:  OBESITY, NOS (ICD-278.00) Discussed exercise.  Complete Medication List: 1)  Flexeril 10 Mg Tabs (Cyclobenzaprine hcl) .... Three times a day as needed 2)  Neurontin 800 Mg Tabs (Gabapentin) .... Take 1 tablet by mouth three times a day 3)  Omeprazole 20 Mg Cpdr (Omeprazole) .... Take 1 capsule by mouth once a day 4)  Buffered Aspirin 325 Mg Tabs (Aspirin buf(cacarb-mgcarb-mgo)) .... Daily 5)  Tramadol Hcl 50 Mg Tabs (Tramadol hcl) .... One to two tabs two times a day as needed pain  Patient Instructions: 1)  Podiatry for foot pain 2)  Tramadol for generalized aches and pains, may use with  Tylenol or Ibuprofen. 3)  Consider audiology for hearing loss 4)  We will call you with your GI referral 5)  Please make your apt for your mammogram Prescriptions: OMEPRAZOLE 20 MG CPDR (OMEPRAZOLE) Take 1 capsule by mouth once a day Brand medically necessary #30 x 11   Entered and Authorized by:   Luretha Murphy NP   Signed by:   Luretha Murphy NP on 08/10/2009   Method used:   Print then Give to Patient   RxID:   [id number] NEURONTIN 800 MG TABS (GABAPENTIN) Take 1 tablet by mouth three times a day Brand medically necessary #90 x 3   Entered and Authorized by:   Luretha Murphy NP   Signed by:   Luretha Murphy NP on 08/10/2009   Method used:   Print then Give to Patient   RxID:   [id number] FLEXERIL 10 MG  TABS (CYCLOBENZAPRINE HCL) three times a day as needed Brand medically necessary #90 x 4   Entered and Authorized by:   Luretha Murphy NP   Signed by:   Luretha Murphy NP on 08/10/2009   Method used:   Print then Give to Patient   RxID:   [id number] TRAMADOL HCL 50 MG TABS (TRAMADOL HCL) one to two tabs two times a day as needed pain Brand medically necessary #120 x 3   Entered and Authorized by:   Luretha Murphy NP   Signed by:   Luretha Murphy NP on 08/10/2009   Method used:   Print then Give to Patient   RxID:   [id number]    Prevention & Chronic Care Immunizations   Influenza vaccine: Not documented   Influenza vaccine deferral: Refused  (08/10/2009)    Tetanus booster: 12/20/2002: Done.    Pneumococcal vaccine: Not documented  Colorectal Screening   Hemoccult: Not documented   Hemoccult action/deferral: Not indicated  (08/10/2009)    Colonoscopy: Not documented   Colonoscopy action/deferral: GI referral  (08/10/2009)  Other Screening   Pap smear: Done.  (03/26/2005)   Pap smear action/deferral: Not indicated S/P hysterectomy  (02/13/2009)    Mammogram: Done.  (11/23/2004)   Mammogram action/deferral: Ordered  (08/10/2009)   Smoking status: never   (08/10/2009)  Lipids   Total Cholesterol: Not documented   LDL: Not documented   LDL Direct: Not documented   HDL: Not documented   Triglycerides: Not documented

## 2010-08-20 NOTE — Letter (Signed)
Summary: Previsit letter  [facility]  [address] [place], [place] [address]   Phone: [phone]  Fax: [phone]       08/13/2009 MRN: [id number]  [name] [address] [place], [place]  [address]  Dear Ms. Mangano,  Welcome to the Gastroenterology Division at [facility]    You are scheduled to see a nurse for your pre-procedure visit on 08-22-09 at 4:30p.m. on the 3rd floor at [facility], [address]  We ask that you try to arrive at our office 15 minutes prior to your appointment time to allow for check-in.  Your nurse visit will consist of discussing your medical and surgical history, your immediate family medical history, and your medications.    Please bring a complete list of all your medications or, if you prefer, bring the medication bottles and we will list them.  We will need to be aware of both prescribed and over the counter drugs.  We will need to know exact dosage information as well.  If you are on blood thinners (Coumadin, Plavix, Aggrenox, Ticlid, etc.) please call our office today/prior to your appointment, as we need to consult with your physician about holding your medication.   Please be prepared to read and sign documents such as consent forms, a financial agreement, and acknowledgement forms.  If necessary, and with your consent, a friend or relative is welcome to sit-in on the nurse visit with you.  Please bring your insurance card so that we may make a copy of it.  If your insurance requires a referral to see a specialist, please bring your referral form from your primary care physician.  No co-pay is required for this nurse visit.     If you cannot keep your appointment, please call [phone] to cancel or reschedule prior to your appointment date.  This allows [place] the opportunity to schedule an appointment for another patient in need of care.    Thank you for choosing [facility] for your medical needs.  We  appreciate the opportunity to care for you.  Please visit [place] at our website  to learn more about our practice.                     Sincerely.                                                                                                                   The Gastroenterology Division

## 2010-08-20 NOTE — Miscellaneous (Signed)
  Clinical Lists Changes  Problems: Removed problem of CONSTIPATION (ICD-564.00) Removed problem of CEREBROVASCULAR ACCIDENT, HX OF (ICD-V12.50) Added new problem of ATONY OF BLADDER (ICD-596.4) Added new problem of UTEROVAGINAL PROLAPSE, INCOMPLETE (ICD-618.2) Removed problem of POSTMENOPAUSAL STATUS (ICD-V49.81) Removed problem of TOBACCO USER (ICD-305.1) Medications: Added new medication of URECHOLINE 25 MG TABS (BETHANECHOL CHLORIDE) three times a day per uro Added new medication of VESICARE 5 MG TABS (SOLIFENACIN SUCCINATE) daily per uro Observations: Added new observation of PAST MED HX: Pap-CIN1,  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, never on HRT 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 ([facility]) Fatty liver found on GU work-up 05/2010  (06/10/2010 11:22) Added new observation of PAST SURG HX: Appendectomy -, 1991 Bladder tuck - 09/18/2004, BMI 32 - 12/20/2002,  Cholecystectomy - 11/27/2005,  CT - Head negative 10/03 - 03/11/2004,  Echo 9/98 nl -, ,  TAH with oopherectomy - 11/19/2003,  TSH nl - 02/19/2004,  Venous dopplers 11/00 normal - (06/10/2010 11:22)      Past Medical History:    Pap-CIN1,     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

## 2010-08-20 NOTE — Miscellaneous (Signed)
Summary: PA for Omeprazole/ts  Clinical Lists Changes called pt's Ins.[id number]. approved by [place]. #2010-04-26 BRIX. can send RX to pharmacy.Arlyss Repress CMA,  April 26, 2010 5:04 PM

## 2010-08-20 NOTE — Letter (Signed)
Summary: Generic Letter  [facility]  [address]   [place], [place] [address]   Phone: [phone]  Fax: [phone]    08/13/2009  LARRISSA STIVERS [address] [place], [place]  [address]  Dear Ms. Canizalez,   All of your labs are normal.  Your LDL is 103, that is fine for you, just watch your diet and keep walking.  It was nice to see you.   Sincerely,   Luretha Murphy NP  Appended Document: Generic Letter mailed.

## 2010-08-20 NOTE — Assessment & Plan Note (Signed)
Summary: breast pain,tcb   Vital Signs:  Patient profile:   55 year old female Height:      65 inches Weight:      212 pounds BMI:     35.41 Temp:     98.0 degrees F oral Pulse rate:   73 / minute BP sitting:   148 / 76  (left arm) Cuff size:   large  Vitals Entered By: Tessie Fass CMA (May 17, 2010 3:26 PM) Is Patient Diabetic? No   History of Present Illness: Left breast pain, more in ribs that in the tissue of her breast.  She called for her screening mammogram and when asked if she was having problems they told her to return.  She went to see a urologist in [place], and had a cysto and imaging.  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  He also felt that her tissue was very thin and friable. She feels that is she does not have something for the pain that occurs during the day when she is upright and working that she will need to apply for disability.  She has some hydrocodone from when she broke her arm and had been taking one in the morning recently and finds that it helps her get through her day.  Current Medications (verified): 1)  Flexeril 10 Mg  Tabs (Cyclobenzaprine Hcl) .... Three Times A Day As Needed 2)  Neurontin 800 Mg Tabs (Gabapentin) .... Take 1 Tablet By Mouth Three Times A Day 3)  Omeprazole 40 Mg Cpdr (Omeprazole) .... Take One Tablet Daily At Bedtime 4)  Buffered Aspirin 325 Mg  Tabs (Aspirin Buf(Cacarb-Mgcarb-Mgo)) .... Daily 5)  Meloxicam 15 Mg Tabs (Meloxicam) .... One Daily As Needed (Take in Bursts So When The Pain Is Bad Take For 3-5 Days Then Take A Break) 6)  Polyethylene Glycol 3350  Powd (Polyethylene Glycol 3350) .Marland KitchenMarland KitchenMarland Kitchen 17 Grams in Water Daily To Prevent Constipation, Qs 7)  Hydrocodone-Acetaminophen 10-500 Mg Tabs (Hydrocodone-Acetaminophen) .... One Three Times A Day As Needed Pain 8)  Premarin 0.625 Mg/gm Crea (Estrogens, Conjugated) .... 2 Gm Daily For 2 Weeks Then Twice Weekely, Qs  Allergies: No  Known Drug Allergies  Physical Exam  General:  Usuall appearing Breasts:  no masses, discharge, very tender under left breast on chest wall. Heart:  normal rate and regular rhythm.     Impression & Recommendations:  Problem # 1:  BACK PAIN (ICD-724.5) exercises, add one narcotic pill per day to get through her days, will not increase dose The following medications were removed from the medication list:    Tramadol Hcl 50 Mg Tabs (Tramadol hcl) .Marland Kitchen..Marland Kitchen Two tabs two times a day as needed pain Her updated medication list for this problem includes:    Flexeril 10 Mg Tabs (Cyclobenzaprine hcl) .Marland Kitchen... Three times a day as needed    Buffered Aspirin 325 Mg Tabs (Aspirin buf(cacarb-mgcarb-mgo)) .Marland Kitchen... Daily    Meloxicam 15 Mg Tabs (Meloxicam) ..... One daily as needed (take in bursts so when the pain is bad take for 3-5 days then take a break)    Hydrocodone-acetaminophen 10-500 Mg Tabs (Hydrocodone-acetaminophen) .Marland Kitchen... Daily  Orders: [facility]- Est Level  3 (16109)  Problem # 2:  VAGINITIS, ATROPHIC (ICD-627.3) trial of vaginal estrogen Her updated medication list for this problem includes:    Premarin 0.625 Mg/gm Crea (Estrogens, conjugated) .Marland Kitchen... 2 gm daily for 2 weeks then twice weekely, qs  Orders: [facility]- Est Level  3 (60454)  Problem # 3:  BREAST PAIN (ICD-611.71) way overdue for screening, ordered diagnositc but do nto believe that the reported pain is breast but rather chest wall. Orders: Mammogram (Mammogram) [facility]- Est Level  3 (16109)  Complete Medication List: 1)  Flexeril 10 Mg Tabs (Cyclobenzaprine hcl) .... Three times a day as needed 2)  Neurontin 800 Mg Tabs (Gabapentin) .... Take 1 tablet by mouth three times a day 3)  Omeprazole 40 Mg Cpdr (Omeprazole) .... Take one tablet daily at bedtime 4)  Buffered Aspirin 325 Mg Tabs (Aspirin buf(cacarb-mgcarb-mgo)) .... Daily 5)  Meloxicam 15 Mg Tabs (Meloxicam) .... One daily as needed (take in bursts so when the pain is bad take for  3-5 days then take a break) 6)  Polyethylene Glycol 3350 Powd (Polyethylene glycol 3350) .Marland KitchenMarland KitchenMarland Kitchen 17 grams in water daily to prevent constipation, qs 7)  Hydrocodone-acetaminophen 10-500 Mg Tabs (Hydrocodone-acetaminophen) .... Daily 8)  Premarin 0.625 Mg/gm Crea (Estrogens, conjugated) .... 2 gm daily for 2 weeks then twice weekely, qs  Other Orders: Dexa scan (Dexa scan)  Patient Instructions: 1)  You really need to begin an exercise program to stregthen your muscles in your back and abdomen.  Your legs are strong. 2)  Discussed and recommended PT, she does not feel that she could go to PT at this tiem with her work schedule and living in [place]. 3)  Trial of vaginal estrogens to stregthen tissue  Prescriptions: PREMARIN 0.625 MG/GM CREA (ESTROGENS, CONJUGATED) 2 GM daily for 2 weeks then twice weekely, QS Brand medically necessary #1 x 6   Entered and Authorized by:   Luretha Murphy NP   Signed by:   Luretha Murphy NP on 05/17/2010   Method used:   Print then Give to Patient   RxID:   [id number] HYDROCODONE-ACETAMINOPHEN 10-500 MG TABS (HYDROCODONE-ACETAMINOPHEN) one three times a day as needed pain Brand medically necessary #90 x 0   Entered and Authorized by:   Luretha Murphy NP   Signed by:   Luretha Murphy NP on 05/17/2010   Method used:   Print then Give to Patient   RxID:   [id number]    Orders Added: 1)  Mammogram [Mammogram] 2)  Dexa scan [Dexa scan] 3)  [facility]- Est Level  3 [29528]     Prevention & Chronic Care Immunizations   Influenza vaccine: Not documented   Influenza vaccine deferral: Refused  (08/10/2009)    Tetanus booster: 12/20/2002: Done.    Pneumococcal vaccine: Not documented  Colorectal Screening   Hemoccult: Not documented   Hemoccult action/deferral: Not indicated  (08/10/2009)    Colonoscopy: Not documented   Colonoscopy action/deferral: GI referral  (08/10/2009)  Other Screening   Pap smear: Done.  (03/26/2005)   Pap smear action/deferral:  Not indicated S/P hysterectomy  (02/13/2009)    Mammogram: Done.  (11/23/2004)   Mammogram action/deferral: Ordered  (08/10/2009)   Smoking status: never  (04/26/2010)  Lipids   Total Cholesterol: Not documented   LDL: Not documented   LDL Direct: 103  (08/10/2009)   HDL: Not documented   Triglycerides: Not documented

## 2010-08-20 NOTE — Progress Notes (Signed)
Summary: meds prob  Phone Note Call from Patient Call back at Home Phone [phone] Call back at [phone]   Caller: Patient Summary of Call: insurance won't cover the 20mg  they will only approve 10mg   or 40mg  - needs to [facility] in to [facility] [place] - would like 90 supply pls advise Initial call taken by: De Nurse,  March 04, 2010 1:37 PM  Follow-up for Phone Call        omeprazole. to pcp covering md Follow-up by: Golden Circle RN,  March 04, 2010 1:44 PM  Additional Follow-up for Phone Call Additional follow up Details #1::        pt returned call Additional Follow-up by: De Nurse,  March 04, 2010 4:03 PM    Additional Follow-up for Phone Call Additional follow up Details #2::    Let her know I sent the prescription for 40 mg omeprazole. They may ask for prior approval Follow-up by: Zachery Dauer MD,  March 06, 2010 12:58 PM  New/Updated Medications: OMEPRAZOLE 40 MG CPDR (OMEPRAZOLE) Take one tablet daily at bedtime Prescriptions: OMEPRAZOLE 40 MG CPDR (OMEPRAZOLE) Take one tablet daily at bedtime  #90 x 0   Entered and Authorized by:   Zachery Dauer MD   Signed by:   Zachery Dauer MD on 03/06/2010   Method used:   [facility]ronically to        [facility]  [address] [phone][facility] (retail)       [address]       [place], [place]  [address]       Ph: [phone] or [phone]       Fax: [phone]   RxID:   [id number]

## 2010-08-22 NOTE — Progress Notes (Signed)
  Phone Note Refill Request Call back at [phone]   Refills Requested: Medication #1:  HYDROCODONE-ACETAMINOPHEN 10-500 MG TABS daily Call patient when ready to pick up or has been faxed  Initial call taken by: Abundio Miu,  August 13, 2010 4:13 PM Caller: Patient  Follow-up for Phone Call        3 month supply writtten for and placed up from for the staff to call and patient to pick up.  Handwritten Follow-up by: Luretha Murphy NP,  August 14, 2010 12:30 PM    New/Updated Medications: HYDROCODONE-ACETAMINOPHEN 10-500 MG TABS (HYDROCODONE-ACETAMINOPHEN) 1/2 to one daily (3 month supply_ Prescriptions: HYDROCODONE-ACETAMINOPHEN 10-500 MG TABS (HYDROCODONE-ACETAMINOPHEN) 1/2 to one daily (3 month supply_  #90 x 0   Entered and Authorized by:   Luretha Murphy NP   Signed by:   Luretha Murphy NP on 08/14/2010   Method used:   Handwritten   RxID:   [id number]

## 2010-11-19 ENCOUNTER — Telehealth: Payer: Self-pay | Admitting: Family Medicine

## 2010-11-19 ENCOUNTER — Telehealth: Payer: Self-pay | Admitting: *Deleted

## 2010-11-19 MED ORDER — HYDROCODONE-ACETAMINOPHEN 10-500 MG PO TABS: 1.0000 | ORAL_TABLET | Freq: Every day | ORAL | Status: DC

## 2010-11-19 NOTE — Telephone Encounter (Signed)
Called and informed patient to pick up Rx at front desk.Busick, Rodena Medin

## 2010-11-19 NOTE — Telephone Encounter (Signed)
Will have staff call for patient to pick up.

## 2010-11-19 NOTE — Telephone Encounter (Signed)
Requesting rx for pain medication

## 2010-12-06 NOTE — Op Note (Signed)
[name], [name]                ACCOUNT NO.:  [id number]   MEDICAL RECORD NO.:  [id number]          PATIENT TYPE:  INP   LOCATION:  9399                          FACILITY:  [facility]   PHYSICIAN:  Ginger Carne, MD  DATE OF BIRTH:  03-06-1956   DATE OF PROCEDURE:  09/20/2004  DATE OF DISCHARGE:                                 OPERATIVE REPORT   PREOPERATIVE DIAGNOSES:  1.  Third degree cystocele.  2.  Third degree rectocele.   POSTOPERATIVE DIAGNOSES:  1.  Third degree cystocele.  2.  Third degree rectocele.   PROCEDURE:  Anterior and posterior colporrhaphy with posterior Pelvichol  graft.   SURGEON:  Ginger Carne, MD   ASSISTANT:  None.   COMPLICATIONS:  None immediate.   ESTIMATED BLOOD LOSS:  350 mL.   SPECIMENS:  None.   ANESTHESIA:  General.   OPERATIVE FINDINGS:  The patient demonstrates a visible third degree  cystocele, rectocele, with disruption of the perineum. The patient upon  having two Allis clamps placed on the vaginal cuff and the cuff reduced  still demonstrated evidence for a cystocele and rectocele.  There was no  evidence of cuff prolapse.  There was no evidence either for an enterocele,  either anteriorly or posteriorly.  There was good tissue for the pubovesical  cervical fascia to approximate midline to repair the cystocele; however, the  rectovaginal septum demonstrated poor tissue and a graft was necessary for  appropriate repair.  A rectal exam was performed at the end, and there was  no evidence for injury to the rectum.   OPERATIVE PROCEDURE:  Patient prepped and draped in the usual fashion and  placed in the lithotomy position.  Betadine solution used to antiseptic, and  the patient was catheterized prior to the procedure.  After adequate general  anesthesia, two Allis clamps were placed midline anteriorly over the vaginal  epithelium, starting about 1 cm from the external urethral meatus.  Marcaine  with epinephrine was injected  beneath the vaginal epithelium bilaterally  where the incision was made with a knife midline, and the pubovesical  cervical fascia was carefully dissected off said vaginal epithelium.  Using  a series of interrupted 2-0 Prolene sutures, the pubovesical cervical fascia  was approximated in the midline.  There was no evidence of a paravaginal  defect, and the cystocele was appropriately reduced.  The vaginal epithelium  was trimmed and closed with 2-0 Monocryl running interlocking suture.  Attention was then directed to the posterior repair.   The inverted triangle was made on the perineum with the base at the  fourchette, admitting two fingers.  The skin was then removed.  A midline  incision was made on the posterior vaginal epithelium from the fourchette up  to the cuff.  The rectum was dissected off the vaginal epithelium down to  the medial fascia of the levator musculature.  A series of 2-0 Prolene  interrupted sutures were then placed along either side to affix the  Pelvichol graft with the top of the graft affixed to the posterior aspect of  the  cuff vaginal epithelium (cephalad).  The graft was appropriately trimmed  and was snot under tension.  A standard perineorrhaphy was performed by  approximating the perineum with 2-0 Monocryl interrupted suture and the  distal portion the graft affixed with one Prolene suture.  Bleeding points  hemostatically checked.  Closure of the vaginal and posterior cuff was 2-0  Monocryl running interlocking suture and similar suture used for the  perineal skin closure.  Packing was utilized for hemostasis.  Urine was  clear.  The patient tolerated the procedure well and returned to the  postanesthesia recovery room in excellent condition.  Rectal exam revealed  no injury to the rectal mucosa.      SHB/MEDQ  D:  09/20/2004  T:  09/20/2004  Job:  [id number]

## 2010-12-06 NOTE — Discharge Summary (Signed)
NAME:  [name], [name]                          ACCOUNT NO.:  [id number]   MEDICAL RECORD NO.:  [id number]                   PATIENT TYPE:  INP   LOCATION:  5707                                 FACILITY:  [facility]   PHYSICIAN:  Leighton Roach McDiarmid, M.D.             DATE OF BIRTH:  13-Jan-1956   DATE OF ADMISSION:  03/04/2004  DATE OF DISCHARGE:  03/06/2004                                 DISCHARGE SUMMARY   DISCHARGE DIAGNOSES:  1. Viral gastroenteritis.  2. Dehydration.  3. Goiter.  4. Cutaneous candidiasis.  5. Gastroesophageal reflux disease.  6. History of cerebrovascular accident.  7. Status post recent total abdominal hysterectomy.   DISCHARGE MEDICATIONS:  1. Premarin 0.625 mg daily.  2. Nexium 40 mg daily.  3. Aspirin 81 mg daily.  4. Nystatin powder applied to hysterectomy scar b.i.d. x2 weeks.   SPECIAL INSTRUCTIONS:  The patient was educated on slowly advancing her diet  and encouraging fluids while continuing to have diarrhea.   DISPOSITION:  The patient was discharged home in stable condition.   FOLLOWUP:  Friday, August 19 at 1430 with Ms. Saxon at the [facility].  Telephone number [phone].   HISTORY OF PRESENT ILLNESS:  [name] is a 55 year old female who has a  history of remote CVA and a history of gallbladder disease who came to the  emergency department complaining of tachycardia, epigastric pain, and  persistent vomiting and diarrhea.   PHYSICAL EXAMINATION:  VITAL SIGNS:  She was afebrile with a temperature of  98.3.  GENERAL:  Was ill-appearing and uncomfortable.  She was tachycardic with a  heart rate of 120-124.  ABDOMEN:  Tender to palpation in epigastrium.  No rebound or guarding.   LABORATORIES:  Abdominal ultrasound that showed gallbladder filling with  sludge, slight wall thickening, minimal perigallbladder fluid.  Common bile  duct was dilated to 6.6 mm.  Fatty liver infiltration.  White count 19.1.  Sodium 138, potassium 4.4,  creatinine 0.9, AST 25, ALT 25, total bilirubin  0.6, alkaline phosphatase 115, lipase 29.  Urinalysis was significant for  proteinuria and specific gravity of 1.031.  Urine microscopic examination  was negative.  Urine pregnancy was negative.  Hemoccult-positive.   HOSPITAL COURSE:  PROBLEM 1 - DEHYDRATION:  The patient was given IV fluid  rehydration and sips of clears.  At the time of discharge her heart rate had  fallen to within normal limits and clinically she was well hydrated.   PROBLEM 2 - VIRAL GASTROENTERITIS:  Nausea and vomiting resolved.  Diarrhea  continued.  The patient was able to take reliable clear liquids without any  abdominal discomfort or nausea.  She was no longer requiring any antiemetics  at the time of discharge.  She was consulted on the importance of slowly  advancing a diet and maintaining high fluid intake while continuing to have  diarrhea.   PROBLEM 3 - TACHYCARDIA  WITH HISTORY OF GOITER:  This was concerning for  possible thyroid dysfunction.  TSH was checked and was 2.729, within normal  limits.  Recommend continuing to __________ as an outpatient.   PROBLEM 4 - FECAL OCCULT BLOOD POSITIVE:  Likely secondary to diarrhea.  Repeat studies negative.   PROBLEM 5 - CUTANEOUS CANDIDIASIS:  The patient had a pruritic erythematous  patch around her surgical abdominal incision from her total abdominal  hysterectomy.  Felt this was likely Candida.  Provided Nystatin powder to be  applied b.i.d. x2 weeks.   PROBLEM 6 - TACHYCARDIA:  Resolved with IV fluid hydration, likely secondary  to dehydration.   PROBLEM 7 - GASTROESOPHAGEAL REFLUX DISEASE:  Continue outpatient regimen of  Nexium.   PROBLEM 8 - HISTORY OF CEREBROVASCULAR ACCIDENT:  Continue outpatient risk  factor modification and aspirin.   PROBLEM 9 - GALLBLADDER DISEASE:  The patient's clinical course did not  appear to be consistent with acute cholecystitis.  Reassured by her normal   laboratories despite some biliary tract disease as noted on her abdominal  ultrasound.  When compared to previous studies appeared to be stable.  The  patient would benefit from following this closely as an outpatient.  Would  likely have a low threshold for surgical intervention if indicated.      Barney Drain, M.D.                       Etta Grandchild, M.D.    SS/MEDQ  D:  03/06/2004  T:  03/06/2004  Job:  [id number]   cc:   Deniece Portela A. Sheffield Slider, M.D.  Fax: [phone]

## 2010-12-06 NOTE — H&P (Signed)
[name], [name]                ACCOUNT NO.:  [id number]   MEDICAL RECORD NO.:  [id number]          PATIENT TYPE:  INP   LOCATION:  NA                            [facility]   PHYSICIAN:  Ginger Carne, MD  DATE OF BIRTH:  1956/04/22   DATE OF ADMISSION:  DATE OF DISCHARGE:                                HISTORY & PHYSICAL   ADMISSION DIAGNOSIS:  Cystocele, rectocele, and possible enterocele and/or  vaginal vault prolapse.   HISTORY OF PRESENT ILLNESS:  This patient is a 54 year old Caucasian female,  gravida 4, para 4-0-0-4, admitted because of pelvic pain following a total  abdominal hysterectomy, bilateral salpingo-oophorectomy, abdominal sacral  colpopexy, and paravaginal repair with a Burch colposuspension performed on  Dec 01, 2003.  The patient presented to the office approximately three weeks  ago with a two-day history of lower abdominal and back pain.  At the time of  evaluation she was noted to have a well-healed cuff with a high cystocele  with a questionable enterocele and/or partial vaginal vault prolapse and a  rectocele as well.  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  Apparently she had returned to work following her initial  operation for complete uterovaginal vault prolapse about three weeks after  surgery, despite recommendations to wait at least 6-8 weeks and to restrict  her weight bearing capacities.  Apparently this was not carried out by the  patient due to job restrictions.  The patient states that the pain has  worsened over time and she feels that management is necessary to continue  both her job and to not have a negative impact on her personal life as well.  The patient denies symptoms of stress incontinence, either as an overactive  bladder symptomatology or genuine urinary stress incontinence.   OB-GYN HISTORY:  The patient has had four normal vaginal deliveries in the  past and tubal ligation followed by  in May of 2005, total abdominal  hysterectomy, bilateral salpingo-oophorectomy, paravaginal repair, and  abdominal sacral colpopexy with a cystoscopy.   SOCIAL HISTORY:  The patient is a nonsmoker, does not use alcohol or illicit  drugs.   ALLERGIES:  None.   CURRENT MEDICATIONS:  1.  Nexium 40 mg.  2.  Premarin 1.25 mg.  3.  Percocet 5/325 1-2 every 4-6 hours as needed.   REVIEW OF SYSTEMS:  Negative.   PAST MEDICAL HISTORY:  1.  Menopausal syndrome.  2.  Gastroesophageal reflux disease.   PHYSICAL EXAMINATION:  GENERAL:  This is a pleasant female in no acute  distress.  VITAL SIGNS:  Blood pressure is 116/68, height 5 feet 4 inches, weight 219  pounds.  HEENT:  Grossly normal.  BREASTS:  Exam without masses, discharge, thickenings, or tenderness.  CHEST:  Clear to percussion and auscultation.  CARDIOVASCULAR:  Without murmurs, enlargements.  Regular rate and rhythm.  Extremities, lymphatics, skin, neurological, musculoskeletal systems all  within normal limits.  ABDOMEN:  Soft with a well-healed surgical incision site.  PELVIC:  External genitalia, vulva are normal.  The  patient demonstrates  evidence of either a posterior cystocele and/or a displacement cystocele,  which is located high at the cuff level.  The patient has a second degree  rectocele and there is a questionable enterocele as well.  Upon screening  and standing the patient does not appear to have an enterocele; however, it  is difficult to completely determine this due to the high cystocele.  RECTAL:  Exam is Hemoccult negative without masses as well.   LABORATORY DATA:  Urinalysis is normal.  S2/3/4 reflexes are normal.   IMPRESSION:  Displacement and/or posterior cystocele with the possibility of  an anterior enterocele masquerading as the cystocele and the  possibility of  a partial vaginal vault prolapse with a second degree rectocele.   PLAN:  The patient will be reevaluated in the operating room  with placement  of Allis clamps at the level of the vaginal cuff.  The vaginal vault will be  repositioned under general anesthesia to determine whether the cystocele is  reduced or not.  If it is reduced then attention will be directed to a  vaginal vault suspension.  On the other hand if after replacement of the  vault, cystocele is present, careful dissection vaginally will determine  whether this is a high cystocele and/or an enterocele.  The rectocele will  be repaired in a standard manner with the possible use of graft material.  The patient understands that if at any point it is difficult to determine  whether the patient can undergo safe repair vaginally and abdominal approach  will be carried out.  The patient had a __________Bard graft utilized for  the sacral colpopexy and as explained to the patient, it would be in her  best interest not to disrupt or disturb this portion of the procedure as  there is relatively good support of the vaginal cuff.  The risks of said  surgery were discussed in detail with the patient including injuries to  ureter, bowel, and bladder, possible conversion to an open procedure, as  well as vaginal bleeding.  Infection was also discussed with the patient and  the necessity for avoidance of heavy lifting at work and a full 6-8 week  recovery as well.      SHB/MEDQ  D:  09/18/2004  T:  09/18/2004  Job:  [id number]

## 2010-12-06 NOTE — Op Note (Signed)
NAMECARLINA, [name]                ACCOUNT NO.:  [id number]   MEDICAL RECORD NO.:  [id number]            PATIENT TYPE:   LOCATION:                                 FACILITY:   PHYSICIAN:  Sharlet Salina T. Hoxworth, M.D.  DATE OF BIRTH:   DATE OF PROCEDURE:  09/29/2005  DATE OF DISCHARGE:                                 OPERATIVE REPORT   PRE-AND-POSTOPERATIVE DIAGNOSIS:  Cholelithiasis and cholecystitis.   SURGICAL PROCEDURE:  Laparoscopic cholecystectomy with intraoperative  cholangiogram.   SURGEON:  Sharlet Salina T. Hoxworth, M.D.   ASSISTANT:  Rose Phi. Maple Hudson, M.D.   ANESTHESIA:  General.   BRIEF HISTORY:  [name] is a 55 year old female with a fairly long  history of worsening episodic postprandial epigastric abdominal pain.  Abdominal ultrasound has revealed poorly visualized gallbladder with  thickened walls, internal echogenicity, question sludge versus mass. 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 With this constellation of findings, we have  recommended proceeding with laparoscopic cholecystectomy with cholangiogram.  The procedure, indications of bleeding, infection of bile duct, and bile  duct injury were discussed and understood. The patient now brought to the  operating room for this procedure.   DESCRIPTION OF OPERATION:  The patient brought to the operating room and  placed in the supine position on the operating table and general  endotracheal anesthesia was induced. She received preoperative antibiotics.  The abdomen was widely sterilely prepped and draped. Correct patient and  procedure were verified. Local anesthesia was used to infiltrate the trocar  sites prior to the incisions. A 1-cm incision made at the umbilicus and  dissection carried down to the midline fascia which was sharply incised for  1 cm.  The peritoneum entered under direct vision through a mattress suture  of #0 Vicryl. The  Hasson trocar was placed and pneumoperitoneum established.   Under direct vision a 10-mm trocar was placed in the subxiphoid area, and  two 5-mm trocars in the right subcostal margin. There were significant  omental adhesions of the gallbladder which were carefully taken down with  blunt and cautery dissection; and the fundus exposed. It was quite thickened  and chronically inflamed, consistent with a porcelain gallbladder. The  fundus was able to be grasped and elevated up over the liver and further  fibrofatty adhesions were stripped down off the gallbladder until the  infundibulum could be retracted inferolaterally. The Calot's triangle was  then dissected; and the cystic duct identified relatively easily, and  dissected 360 degrees at the junction with the gallbladder. When the anatomy  was clear, the cystic duct was clipped at the gallbladder junction and an  operative cholangiogram obtained.   Through the cystic duct it showed good filling of normal common bile duct,  and intrahepatic ducts with free flow into the duodenum and no filling  defects. Following this the cholangiocath was removed. The cystic duct was  triply clipped proximally and divided. The cystic artery was doubly clipped  proximally, clipped distally, and divided. The gallbladder was dissected  free from its  bed using hook cautery. It was placed in an EndoCatch bag and  removed through the umbilical port site. The right upper quadrant was then  irrigated and complete hemostasis was obtained of the gallbladder bed.  Trocars were removed under direct vision. All CO2 was evacuated. The  mattress sutures  were secured at the umbilicus. Skin incisions were closed with interrupted  subcuticular 4-0 Monocryl and Steri-Strips.  Sponge, needle, and instrument  counts were correct. Dry dressings were applied; and the patient taken to  recovery in good condition.      Lorne Skeens. Hoxworth, M.D.  Electronically  Signed     BTH/MEDQ  D:  09/29/2005  T:  09/30/2005  Job:  [id number]

## 2010-12-06 NOTE — H&P (Signed)
NAME:  [name], [name]                          ACCOUNT NO.:  [id number]   MEDICAL RECORD NO.:  [id number]                   PATIENT TYPE:  INP   LOCATION:  5707                                 FACILITY:  [facility]   PHYSICIAN:  Ursula Beath, MD               DATE OF BIRTH:  1955/08/02   DATE OF ADMISSION:  03/04/2004  DATE OF DISCHARGE:                                HISTORY & PHYSICAL   CHIEF COMPLAINT:  Nausea and vomiting.   HISTORY OF PRESENT ILLNESS:  This is a 55 year old female with past medical  history significant for CVA and gallbladder disease who comes to the ED  after awaking early this morning with a racing heart, epigastric pain and  multiple episodes of emesis and diarrhea.  She has become incontinent of  stool, and she is still very nauseated and has had episodes of emesis in the  emergency department.  She denies hematochezia, but possibly had some blood  tinged emesis, but it is mostly food that she is vomiting.  She denies  shortness of breath.  About 9 weeks ago, she had gynecologic surgery, a TH  and bladder elevation without complications.  She is now voiding normally  with no postop fevers.  She reports poor p.o. intake, occasional dizziness,  racing heart, sweating, chills, all worse with movement.  No shortness of  breath.  No substernal chest pain or radiation.  No feeling of irregular  rhythm, just a racing pulse.   REVIEW OF SYSTEMS:  No change in energy, sleep, weight or fever prior to  admission.        CARDIOVASCULAR:  As in the HPI.  RESPIRATIONS:  No  shortness of breath.  GI:  Constipation for approximately a week until today  with the diarrhea.  SKIN:  No rashes or lesion noted.  NEUROLOGIC:  No  seizures, no syncope.  PSYCH:  No anxiety, no depression.  MUSCULOSKELETAL:  She does complain of leg cramps at night, and though she does complain of  night sweats and fleshing and a goiter.  ENT:  She does complain of  dysphagia that is a problem she  has had for years.  GU:  She denies dysuria  or urinary frequency.  HEMATOLOGY:  No easy bruising or bleeding.   PAST MEDICAL HISTORY:  1. Goiter.  2. Obesity.  3. Cerebrovascular accident 7 years ago.  4. Gastroesophageal reflux disease.  5. Generalized pain.  6. Gallbladder disease.   MEDICATIONS:  1. Premarin 0.625 mg since her hysterectomy.  2. Nexium 40 mg p.o. daily.  3. Aspirin 81 mg p.o. daily.   ALLERGIES:  No known drug allergies.   PROCEDURES:  A recent surgery previously mentioned at [facility].   FAMILY HISTORY:  Mother died in accident.  Father had MI's, first in his  12's.  Heart disease in sister.  Epilepsy in brother.  Also, gallbladder  disease  in her family and mother, two sisters and her daughter.   SOCIAL HISTORY:  She is divorced, lives with her boyfriend and her  boyfriend's 33 year old son.  She does have a daughter that lives in the  area, and she has three other sons that live in the area.  Denies alcohol,  drugs or tobacco use.   PHYSICAL EXAMINATION:  VITAL SIGNS:  Temperature 97.4-98.3, pulse rate 124-  120, blood pressure 133/86-120/74, respirations 20, saturating 96% on room  air.  GENERAL APPEARANCE:  She is ill-appearing and uncomfortable, but in no acute  distress.  She is alert and oriented.  HEENT:  Pupils equal, round and reactive to light.  Extraocular muscles  intact.  Sclerae are clear.  She has no erythema or exudate in her  oropharynx.  She does have poor dentition, however.  She has a nodular  goiter, but no lymphadenopathy.  LUNG:  Clear to auscultation bilaterally with no wheeze, rale or crackle  with good air movement.  CARDIOVASCULAR:  No murmurs, rubs or gallops noted.  She is mildly  tachycardic and has 2+ distal pulses.  MUSCULOSKELETAL:  No cyanosis, clubbing or edema.  ABDOMEN:  Soft, but tender to palpation, especially in the mid epigastric  region.  No hepatosplenomegaly.  SKIN:  Without rash or  lesion.  NEUROLOGICAL:  Cranial nerves II-XII are grossly intact.  RECTAL:  Sphincter tone is grossly intact.  She has heme positive stool.  No  gross blood.   LABORATORY TESTS:  Abdominal ultrasound performed in the emergency  department shows a gallbladder filled with sludge with slight wall  thickening, minimal peri-gallbladder fluid.  Cannot exclude early acute  cholecystitis.  Common bile duct was borderline dilated to 6.6 mm, and she  has a fatty liver.  Point of care:  Cardiac enzymes:  CK MB less than 1.0,  troponin I less than 0.05, myoglobin 79.9.  C-MET:  Sodium 138, potassium  4.4, chloride 107, bicarbonate 20, BUN 17, creatinine 0.9, glucose 133,  calcium 10.1, SGOT 25, SGPT 25, total bilirubin 0.6, alk-phos 115, total  protein 8.6, albumin 3.9, lipase 29.  Urinalysis:  Specific gravity 1.031,  negative for glucose, moderate bilirubin, 15 ketones, small blood, greater  than 300 protein, 0.2 urobilinogen, negative nitrite, small leukocyte  esterase, few epithelial cells.  She has __________ crystals, no Hyland  cath, 0-2 white blood cell count, 0-2 red blood cells, few bacteria and  mucous.  She has a urine HCG which is negative, and her stool is hemoccult  positive.  White count 19.1, hemoglobin 14.1, hematocrit 42.7, platelets  391,000, 92% neutrophils, 5% lymphocytes, 3% monocytes, 0 eos, 0 basos.   ASSESSMENT/PLAN:  This is a 55 year old female with a one day history of  nausea, vomiting and diarrhea.  1. Abdominal pain, likely gastroenteritis, but cannot rule out gallbladder     disease given her findings on her ultrasound, although her liver enzymes     and liver function tests are within normal limits.  Pancreatitis is also     within the differential.  However, her lipase was within normal limits.     We were concerned about dehydration, so we will admit her for IV fluids,     pain and nausea control.  She does have heme positive stool, but we    believe this is  likely secondary to her copious number of bowel movements     as she has no hematochezia or melena.  Will start her on Protonix,  Phenergan and Zofran for nausea control and also Dilaudid p.r.n. to     control her pain.  We will hold her Premarin, aspirin and Nexium for now.     If this appears to be looking more like gallbladder disease in the     morning, we will consult surgery.  2. Proteinuria and microscopic hematuria.  The patient does not report blood     in her urine.  But with abdominal pain, nausea and vomiting, one wonders     if this could be possibly secondary to a renal stone.  Will discuss with     the team possibly obtaining a CT scan in the morning.                                               Ursula Beath, MD   JT/MEDQ  D:  03/04/2004  T:  03/05/2004  Job:  [id number]

## 2010-12-06 NOTE — Discharge Summary (Signed)
NAMEELLEANNA, [name]                ACCOUNT NO.:  [id number]   MEDICAL RECORD NO.:  [id number]          PATIENT TYPE:  INP   LOCATION:  9310                          FACILITY:  [facility]   PHYSICIAN:  Ginger Carne, MD  DATE OF BIRTH:  1956-07-16   DATE OF ADMISSION:  09/20/2004  DATE OF DISCHARGE:  09/22/2004                                 DISCHARGE SUMMARY   DISCHARGE DIAGNOSES:  1.  Third degree pulsing cystocele.  2.  Third degree rectocele.   PROCEDURE:  Anterior and posterior colporrhaphy with posterior pelvico graft  placement and perineorrhaphy.   HOSPITAL COURSE:  This is a 55 year old Caucasian female who underwent the  aforementioned procedures on September 20, 2004.  The patient had a successful  abdominal sacropexy with total abdominal hysterectomy, bilateral salpingo-  oophorectomy, in May of 2005 for complete uterovaginal prolapse.  The  patient had a rectocele and cystocele, but was not repaired at the same  operative time to avoid infection.  She returned to the hospital for said  repair of her rectocele and cystocele.  Postoperative course was uneventful.  She was afebrile.  Hemoglobin was 9.4 postoperative from 12.3 preoperative.  The patient had her Foley catheter removed the day following surgery, but  had to be replaced secondary to an 800 mL residual urine volume.   The patient had minimal flow from her vagina and the vaginal packing was  removed the day after surgery.  She was passing flatus, but did not have a  bowel movement as of the time of discharge.   The patient was given instructions regarding avoiding constipation of the  use of Colace 100 mg twice a day.  In addition, she was also requested to  utilize Percocet 5/325 one to two every four to six hours and Augmentin 500  mg twice a day.  Foley catheter will be left in place and removed in three  days with a leg bag attachment.  In addition, the patient was advised to  contact the office for temperature  elevation of 100.4 degrees Fahrenheit,  avoid heavy lifting, increasing vaginal discharge, bleeding, or difficulty  with bowel movements.  The patient will return in three days for her  postoperative visit in addition to removal of Foley catheter.      SHB/MEDQ  D:  09/22/2004  T:  09/23/2004  Job:  [id number]

## 2010-12-13 ENCOUNTER — Encounter: Payer: Self-pay | Admitting: Family Medicine

## 2010-12-13 ENCOUNTER — Ambulatory Visit (INDEPENDENT_AMBULATORY_CARE_PROVIDER_SITE_OTHER): Payer: Managed Care, Other (non HMO) | Admitting: Family Medicine

## 2010-12-13 DIAGNOSIS — E049 Nontoxic goiter, unspecified: Secondary | ICD-10-CM

## 2010-12-13 DIAGNOSIS — R490 Dysphonia: Secondary | ICD-10-CM

## 2010-12-13 DIAGNOSIS — M549 Dorsalgia, unspecified: Secondary | ICD-10-CM

## 2010-12-13 MED ORDER — CYCLOBENZAPRINE HCL 10 MG PO TABS: 10.0000 mg | ORAL_TABLET | Freq: Three times a day (TID) | ORAL | Status: DC | PRN

## 2010-12-13 MED ORDER — ESTROGENS, CONJUGATED 0.625 MG/GM VA CREA: TOPICAL_CREAM | Freq: Every day | VAGINAL | Status: DC

## 2010-12-13 MED ORDER — GABAPENTIN 800 MG PO TABS: 800.0000 mg | ORAL_TABLET | Freq: Three times a day (TID) | ORAL | Status: DC

## 2010-12-13 MED ORDER — HYDROCODONE-ACETAMINOPHEN 10-500 MG PO TABS: 1.0000 | ORAL_TABLET | Freq: Every day | ORAL | Status: DC

## 2010-12-13 MED ORDER — OMEPRAZOLE 40 MG PO CPDR: 40.0000 mg | DELAYED_RELEASE_CAPSULE | Freq: Every day | ORAL | Status: DC

## 2010-12-13 NOTE — Patient Instructions (Signed)
We will contact your or the ENT office will regarding your apt/ Continue medications as listed TSH results

## 2010-12-13 NOTE — Assessment & Plan Note (Signed)
Developing a hoarse quality to her voice, goiter appears to be enlarging.  Recheck TSH but this has always been normal.  ENT to evaluate.

## 2010-12-13 NOTE — Assessment & Plan Note (Signed)
Continue hydrocodone/APAP one per day for work.  Has kept this steady.  Does not use on the weekends, keeps her in a job.

## 2010-12-13 NOTE — Progress Notes (Signed)
  Subjective:    [name], female    DOB: 31-Jan-1956, 55 y.o.   MRN: [id number]  HPI Increased hoariness of voice, intermittent sore throat, feeling of difficulty swallowing in the throat area.  Known goiter, she believes the right side is getting larger.  She has never smoked.  In the past was on synthroid for suppression of goiter but over the years was taken off, as she had normal thyroid function. Never had biopsy.  GERD treated for years with PPI.  Body pain and back pain treated with hydrocodone/APAP she uses one pill before going to work, she has been able to continue to work in [employer].  She does not take the pain medication on the weekends.    She has stopped her bladder medications as they did not make a difference.  Seen by urology in the past year.   Review of Systems  Constitutional: Negative for activity change and unexpected weight change.  HENT: Positive for sore throat, mouth sores, trouble swallowing and voice change.   Cardiovascular: Negative for chest pain.  Gastrointestinal: Positive for constipation.  Genitourinary: Negative for dysuria and urgency.  Musculoskeletal: Positive for back pain.       Objective:   Physical Exam  Constitutional:       Alert, obese middle aged female, hoarse quality to voice.  HENT:  Mouth/Throat: Oropharynx is clear and moist. No oropharyngeal exudate.       Very poor dentition + goiter, right side > left  Neck: No tracheal deviation present. Thyromegaly present.  Cardiovascular: Normal rate, regular rhythm and normal heart sounds.   Pulmonary/Chest: Effort normal and breath sounds normal. No stridor.  Lymphadenopathy:    She has no cervical adenopathy.          Assessment & Plan:

## 2010-12-17 ENCOUNTER — Telehealth: Payer: Self-pay | Admitting: *Deleted

## 2010-12-17 NOTE — Telephone Encounter (Signed)
Patient informed of appointment with [facility] ENT 12/31/2010 at 10:30.Levonne Carreras, Rodena Medin

## 2010-12-18 ENCOUNTER — Encounter: Payer: Self-pay | Admitting: Family Medicine

## 2011-01-07 ENCOUNTER — Other Ambulatory Visit: Payer: Self-pay | Admitting: Otolaryngology

## 2011-01-07 DIAGNOSIS — E079 Disorder of thyroid, unspecified: Secondary | ICD-10-CM

## 2011-01-08 ENCOUNTER — Telehealth: Payer: Self-pay | Admitting: Family Medicine

## 2011-01-08 MED ORDER — AMOXICILLIN 500 MG PO CAPS: 500.0000 mg | ORAL_CAPSULE | Freq: Three times a day (TID) | ORAL | Status: AC

## 2011-01-08 NOTE — Telephone Encounter (Signed)
Forward to Darl Pikes, does patient need to come in for office visit for this?

## 2011-01-08 NOTE — Telephone Encounter (Signed)
Pt is having swelling and pain in /gumsteeth that need to come out and she is wanting Darl Pikes to call in an antibiotic for her.  She said she has spoken to [place] about this at last visit.  Offered her to be seen and she only wanted Darl Pikes to call this in. [facility] [address] [place]

## 2011-01-08 NOTE — Telephone Encounter (Signed)
In a lot of pain, swelling of gums.  Had previous problem.  Called dentist and they want 250$ and they do not have it now.  Will prescribe amox for 2 weeks.

## 2011-08-06 ENCOUNTER — Ambulatory Visit (INDEPENDENT_AMBULATORY_CARE_PROVIDER_SITE_OTHER): Payer: 59 | Admitting: Family Medicine

## 2011-08-06 ENCOUNTER — Encounter: Payer: Self-pay | Admitting: Family Medicine

## 2011-08-06 DIAGNOSIS — M79609 Pain in unspecified limb: Secondary | ICD-10-CM

## 2011-08-06 DIAGNOSIS — M549 Dorsalgia, unspecified: Secondary | ICD-10-CM

## 2011-08-06 DIAGNOSIS — M79673 Pain in unspecified foot: Secondary | ICD-10-CM

## 2011-08-06 MED ORDER — LIDOCAINE 5 % EX PTCH: 2.0000 | MEDICATED_PATCH | Freq: Two times a day (BID) | CUTANEOUS | Status: DC

## 2011-08-06 MED ORDER — VENLAFAXINE HCL ER 37.5 MG PO CP24: 37.5000 mg | ORAL_CAPSULE | Freq: Every day | ORAL | Status: DC

## 2011-08-06 MED ORDER — GABAPENTIN 800 MG PO TABS: 800.0000 mg | ORAL_TABLET | Freq: Three times a day (TID) | ORAL | Status: DC

## 2011-08-06 MED ORDER — HYDROCODONE-ACETAMINOPHEN 10-500 MG PO TABS: 1.0000 | ORAL_TABLET | Freq: Every day | ORAL | Status: DC

## 2011-08-06 MED ORDER — OMEPRAZOLE 40 MG PO CPDR: 40.0000 mg | DELAYED_RELEASE_CAPSULE | Freq: Every day | ORAL | Status: DC

## 2011-08-06 MED ORDER — CYCLOBENZAPRINE HCL 10 MG PO TABS: 10.0000 mg | ORAL_TABLET | Freq: Three times a day (TID) | ORAL | Status: DC | PRN

## 2011-08-06 MED ORDER — POLYETHYLENE GLYCOL 3350 POWD: 17.0000 g | Freq: Every day | Status: DC

## 2011-08-06 NOTE — Patient Instructions (Signed)
It was good to meet you today  I am starting you on 2 new medications for you back  Use these as directed.  Come back to see me in 2-4 weeks Be sure to set up an appointment with Sports Medicine for your heel pain.  Call with any questions,  God Bless,  Doree Albee MD

## 2011-08-08 NOTE — Progress Notes (Signed)
  Subjective:    [name], female    DOB: Oct 19, 1955, 56 y.o.   MRN: [id number]  HPI Pt is here for new MD visit. Pt previously a pt of Luretha Murphy.   Back Pain: Pt states that she has had longstanding low back pain. This has been present for several years per pt.  Pt is currently on lor tab for chronic pain. 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 Pt states that she usually takes one tab before and after work for pain management. Pt denies any radicular sxs. No bowel or bladder anesthesia.  Pt states that she has not had any recent imaging lumbar back.   Heel Pain: Pt states that she has had heel pain for the last 2 weeks. Pain seems to be present on medial side of heel. Pain seems to be worse in the am and improves throughout the day, though pain seems to recur at the end of her work shift. Pt states that this has not happened before. No recent trauma to the area. Pt has been able to ambulate with pain, with mild-moderate improvement in pain with NSAIDs. Pt states that she has never had this type of heel/foot pain in the past.   Review of Systems See HPI, otherwise ROS negative    Objective:   Physical Exam  Musculoskeletal:       Right shoulder: She exhibits no swelling, no effusion and no crepitus.       Feet:   Gen: up in chair, NAD CV: RRR, no murmurs auscultated PULM: CTAB, no wheezes, rales, rhoncii ABD: S/NT/+ bowel sounds  EXT: 2+ peripheral pulses MSK: +TTP in upper and lower lumbar spine; R heel: + TTP along medial aspect aspect of heel, no pain along distribution of plantar fascia.     Assessment & Plan:

## 2011-08-14 DIAGNOSIS — M79673 Pain in unspecified foot: Secondary | ICD-10-CM | POA: Insufficient documentation

## 2011-08-14 NOTE — Assessment & Plan Note (Signed)
Started on effexor and lidoderm patch for this. Will otherwise continue with regimen.

## 2011-08-14 NOTE — Assessment & Plan Note (Signed)
Relatively broad differential for this including achilles tendinitis, pre achilles bursitis, plantar fasciitis, and tarsal tunnel syndrome. Exam not fully characteristic of plantar fasciitis. Overall case discussed with Dr. Jennette Kettle. Will formally refer to [facility] as pt may benefit from heel ultrasound. Discussed prn NSAID use in the interim.

## 2011-10-24 ENCOUNTER — Telehealth: Payer: Self-pay | Admitting: Family Medicine

## 2011-10-24 NOTE — Telephone Encounter (Signed)
Will forward to Dr Newton. 

## 2011-10-24 NOTE — Telephone Encounter (Signed)
Patient is calling for refill on Hydrocodone and Lidoderm.  She would like to come and pick both Rx's up.  Please call her when they are ready for pick.

## 2011-10-27 ENCOUNTER — Other Ambulatory Visit: Payer: Self-pay | Admitting: Family Medicine

## 2011-10-27 MED ORDER — LIDOCAINE 5 % EX PTCH: 2.0000 | MEDICATED_PATCH | Freq: Two times a day (BID) | CUTANEOUS | Status: DC

## 2011-10-27 NOTE — Telephone Encounter (Signed)
Pt will need appt for vicodin.

## 2012-08-25 ENCOUNTER — Emergency Department (HOSPITAL_COMMUNITY)
Admission: EM | Admit: 2012-08-25 | Discharge: 2012-08-26 | Disposition: A | Discharge status: Home / Self Care | Payer: BC Managed Care – PPO | Attending: Emergency Medicine | Admitting: Emergency Medicine

## 2012-08-25 ENCOUNTER — Encounter (HOSPITAL_COMMUNITY): Payer: Self-pay | Admitting: Emergency Medicine

## 2012-08-25 DIAGNOSIS — Z8673 Personal history of transient ischemic attack (TIA), and cerebral infarction without residual deficits: Secondary | ICD-10-CM | POA: Insufficient documentation

## 2012-08-25 DIAGNOSIS — N390 Urinary tract infection, site not specified: Secondary | ICD-10-CM

## 2012-08-25 DIAGNOSIS — Z79899 Other long term (current) drug therapy: Secondary | ICD-10-CM | POA: Insufficient documentation

## 2012-08-25 DIAGNOSIS — R109 Unspecified abdominal pain: Secondary | ICD-10-CM | POA: Insufficient documentation

## 2012-08-25 DIAGNOSIS — M545 Low back pain, unspecified: Secondary | ICD-10-CM | POA: Insufficient documentation

## 2012-08-25 DIAGNOSIS — Z9089 Acquired absence of other organs: Secondary | ICD-10-CM | POA: Insufficient documentation

## 2012-08-25 DIAGNOSIS — M549 Dorsalgia, unspecified: Secondary | ICD-10-CM

## 2012-08-25 DIAGNOSIS — R110 Nausea: Secondary | ICD-10-CM | POA: Insufficient documentation

## 2012-08-25 DIAGNOSIS — Z7982 Long term (current) use of aspirin: Secondary | ICD-10-CM | POA: Insufficient documentation

## 2012-08-25 DIAGNOSIS — Z87442 Personal history of urinary calculi: Secondary | ICD-10-CM | POA: Insufficient documentation

## 2012-08-25 DIAGNOSIS — Z9889 Other specified postprocedural states: Secondary | ICD-10-CM | POA: Insufficient documentation

## 2012-08-25 HISTORY — DX: Calculus of kidney: N20.0

## 2012-08-25 HISTORY — DX: Cerebral infarction, unspecified: I63.9

## 2012-08-25 LAB — CBC WITH DIFFERENTIAL/PLATELET
Eosinophils Absolute: 0.3 10*3/uL (ref 0.0–0.7)
Lymphocytes Relative: 30 % (ref 12–46)
Lymphs Abs: 3.4 10*3/uL (ref 0.7–4.0)
MCH: 31.2 pg (ref 26.0–34.0)
Neutrophils Relative %: 56 % (ref 43–77)
Platelets: 333 10*3/uL (ref 150–400)
RBC: 4.23 MIL/uL (ref 3.87–5.11)
WBC: 11.3 10*3/uL — ABNORMAL HIGH (ref 4.0–10.5)

## 2012-08-25 LAB — URINALYSIS, ROUTINE W REFLEX MICROSCOPIC
Hgb urine dipstick: NEGATIVE
Nitrite: NEGATIVE
Specific Gravity, Urine: 1.012 (ref 1.005–1.030)
Urobilinogen, UA: 0.2 mg/dL (ref 0.0–1.0)

## 2012-08-25 LAB — BASIC METABOLIC PANEL
GFR calc non Af Amer: >90 mL/min (ref >90)
Glucose, Bld: 99 mg/dL (ref 70–99)
Potassium: 3.7 mEq/L (ref 3.5–5.1)
Sodium: 141 mEq/L (ref 135–145)

## 2012-08-25 LAB — URINE MICROSCOPIC-ADD ON

## 2012-08-25 LAB — WET PREP, GENITAL
Clue Cells Wet Prep HPF POC: NONE SEEN
Yeast Wet Prep HPF POC: NONE SEEN

## 2012-08-25 MED ORDER — IOHEXOL 300 MG/ML  SOLN
50.0000 mL | Freq: Once | INTRAMUSCULAR | Status: AC | PRN
  Administered 2012-08-25: 50 mL via ORAL

## 2012-08-25 MED ORDER — IOHEXOL 300 MG/ML  SOLN: 50.0000 mL | Freq: Once | INTRAMUSCULAR | Status: DC | PRN

## 2012-08-25 NOTE — ED Notes (Signed)
Onset one week ago lower middle back pain radiating to left lower back and now past 3 days lower middle abdominal pain.  Pain currently 8/10 achy dull sharp with nausea.

## 2012-08-25 NOTE — ED Provider Notes (Signed)
History     CSN: [id number]  Arrival date & time 08/25/12  1654   First MD Initiated Contact with Patient 08/25/12 2111      Chief Complaint  Patient presents with  . Back Pain  . Abdominal Pain    (Consider location/radiation/quality/duration/timing/severity/associated sxs/prior treatment) HPI Comments: 57 y/o f h/o multiple pelvic surgeries for bladder prolapse p/w left low back pain x1 week and suprapubic cramping x4 days. Pain is intermittent. Associated with movement and lifting or setting objects down. Denies any radiation down legs. No incontinence. Normal BM's. No dysuria or hematuria. No numbness around groin. Chronic hand and feet numbness (on gabapentin). No fevers   Patient is a 57 y.o. female presenting with back pain and abdominal pain.  Back Pain  This is a new problem. The current episode started more than 1 week ago. Episode frequency: intermittently. The problem has been gradually worsening. The pain is associated with no known injury. Pain location: low left back. The quality of the pain is described as stabbing. The pain does not radiate. The pain is moderate. The symptoms are aggravated by bending, twisting and certain positions. Associated symptoms include abdominal pain. Pertinent negatives include no chest pain, no fever, no headaches, no dysuria and no pelvic pain.  Abdominal Pain The primary symptoms of the illness include abdominal pain and nausea. The primary symptoms of the illness do not include fever, shortness of breath, vomiting, diarrhea, dysuria, vaginal discharge or vaginal bleeding. The current episode started more than 2 days ago. The onset of the illness was gradual. The problem has been gradually worsening.  The abdominal pain began more than 2 days ago. The pain came on gradually. The abdominal pain has been gradually worsening since its onset. The abdominal pain is located in the suprapubic region. The abdominal pain does not radiate. The abdominal  pain is relieved by nothing.  Additional symptoms associated with the illness include back pain. Symptoms associated with the illness do not include chills or hematuria.    Past Medical History  Diagnosis Date  . Kidney stone   . Stroke     Past Surgical History  Procedure Date  . Bladder surgery   . Cholecystectomy     No family history on file.  History  Substance Use Topics  . Smoking status: Never Smoker   . Smokeless tobacco: Not on file  . Alcohol Use: No    OB History    Grav Para Term Preterm Abortions TAB SAB Ect Mult Living                  Review of Systems  Constitutional: Negative for fever and chills.  HENT: Negative for congestion and rhinorrhea.   Eyes: Negative for pain and visual disturbance.  Respiratory: Negative for cough and shortness of breath.   Cardiovascular: Negative for chest pain and leg swelling.  Gastrointestinal: Positive for nausea and abdominal pain. Negative for vomiting and diarrhea.  Genitourinary: Negative for dysuria, hematuria, flank pain, decreased urine volume, vaginal bleeding, vaginal discharge, difficulty urinating, vaginal pain and pelvic pain.  Musculoskeletal: Positive for back pain.  Skin: Negative for color change, rash and wound.  Neurological: Negative for dizziness and headaches.  All other systems reviewed and are negative.    Allergies  Review of patient's allergies indicates no known allergies.  Home Medications   Current Outpatient Rx  Name  Route  Sig  Dispense  Refill  . ASPIRIN EC 81 MG PO TBEC   Oral  Take 81 mg by mouth daily.         . CYCLOBENZAPRINE HCL 10 MG PO TABS   Oral   Take 10 mg by mouth 3 (three) times daily as needed. For muscle spasms         . DOCUSATE SODIUM 100 MG PO CAPS   Oral   Take 100 mg by mouth daily.         Marland Kitchen GABAPENTIN 800 MG PO TABS   Oral   Take 1 tablet (800 mg total) by mouth 3 (three) times daily.   90 tablet   6   . OMEPRAZOLE 40 MG PO CPDR    Oral   Take 1 capsule (40 mg total) by mouth at bedtime.   30 capsule   6   . METAMUCIL PO   Oral   Take 1 scoop by mouth daily.           BP 134/81  Pulse 84  Temp 97.9 F (36.6 C) (Oral)  Resp 16  SpO2 99%  Physical Exam  Nursing note and vitals reviewed. Constitutional: She is oriented to person, place, and time. She appears well-developed and well-nourished. No distress.  HENT:  Head: Normocephalic and atraumatic.  Eyes: Conjunctivae normal are normal. Right eye exhibits no discharge. Left eye exhibits no discharge.  Neck: No tracheal deviation present.  Cardiovascular: Normal rate, regular rhythm, normal heart sounds and intact distal pulses.   Pulmonary/Chest: Effort normal and breath sounds normal. No stridor. No respiratory distress. She has no wheezes. She has no rales.  Abdominal: Soft. She exhibits no distension. There is tenderness (mild suprapubic). There is no guarding.  Genitourinary: There is no rash, tenderness, lesion or injury on the right labia. There is no rash, tenderness, lesion or injury on the left labia. Cervix exhibits no motion tenderness and no friability. Discharge: moderate white discharge. No tenderness around the vagina.  Musculoskeletal: She exhibits no edema and no tenderness.       Cervical back: Normal.       Thoracic back: Normal.       Lumbar back: She exhibits tenderness (left lower back). She exhibits no bony tenderness and no deformity.       Arms:      Good pulses distally  Neurological: She is alert and oriented to person, place, and time.  Skin: Skin is warm and dry.  Psychiatric: She has a normal mood and affect. Her behavior is normal.    ED Course  Procedures (including critical care time)  Labs Reviewed  CBC WITH DIFFERENTIAL - Abnormal; Notable for the following:    WBC 11.3 (*)     Monocytes Absolute 1.3 (*)     All other components within normal limits  URINALYSIS, ROUTINE W REFLEX MICROSCOPIC - Abnormal; Notable  for the following:    APPearance HAZY (*)     Leukocytes, UA MODERATE (*)     All other components within normal limits  WET PREP, GENITAL - Abnormal; Notable for the following:    WBC, Wet Prep HPF POC MANY (*)     All other components within normal limits  URINE MICROSCOPIC-ADD ON - Abnormal; Notable for the following:    Squamous Epithelial / LPF FEW (*)     Bacteria, UA FEW (*)     All other components within normal limits  BASIC METABOLIC PANEL  GC/CHLAMYDIA PROBE AMP  URINE CULTURE   Ct Abdomen Pelvis W Contrast  08/26/2012  *RADIOLOGY REPORT*  Clinical  Data: Back pain and lower abdominal pain.  CT ABDOMEN AND PELVIS WITH CONTRAST  Technique:  Multidetector CT imaging of the abdomen and pelvis was performed following the standard protocol during bolus administration of intravenous contrast.  Contrast: OMNIPAQUE IOHEXOL 300 MG/ML  SOLN  Comparison: 08/29/2005  Findings: Linear atelectasis in the lung bases.  The diffuse low attenuation change throughout the liver consistent with diffuse fatty infiltration.  No focal liver lesions identified.  The gallbladder is surgically absent.  The pancreas, spleen, adrenal glands, abdominal aorta, and retroperitoneal lymph nodes are unremarkable.  10 mm cyst in the upper pole of the left kidney is stable.  No hydronephrosis or solid mass in either kidney.  No pyelocaliectasis or ureterectasis.  The stomach, small bowel, and colon are not abnormally distended.  The colon is stool- filled.  Contrast material flows through to the cecum consistent with no small bowel obstruction.  No free air or free fluid in the abdomen.  Pelvis:  The uterus appears to be surgically absent.  No abnormal adnexal masses.  Bladder wall is not thickened.  No free or loculated pelvic fluid collections.  The appendix is not identified.  No diverticulitis.  Degenerative changes in the lumbar spine with normal alignment.  IMPRESSION: Diffuse fatty infiltration of the liver.  No  acute process demonstrated in the abdomen or pelvis.   Original Report Authenticated By: Burman Nieves, M.D.      1. Abdominal pain   2. Back pain       MDM     56 /yo F p/w suprapubic pain and left low back pain. HDS, af. Exam as above  Uncertain source of pain. UA with possible UTI. CT pending.  Care of pt taken over by Dr. Norlene Campbell at about 01:15. Plan is to f/u CT scan results.   Doubt cauda equina, conus medullaris syndrome or other epidural compression syndrome - no saddle paresthesias, no bowel/bladder incontinence, no UMN signs on physical exam, inconsistent with history and physical, low risk, primary diagnosis much more likely Doubt abscess, epidural mass lesion, fracture, renal colic/infarct, AAA, aortic dissection, pna, pancreatitis, transverse myelitis, herpetic neuralgia, as inconsistent with history and physical, low risk, primary diagnosis much more likely   Do not believe MRI or other further imaging necessary at this time.  Labs and imaging reviewed by myself and considered in medical decision making if ordered. Imaging interpreted by radiology.   Discussed case with Dr. Rubin Payor who is in agreement with assessment and plan.    Stevie Kern, MD 08/26/12 [phone]

## 2012-08-26 ENCOUNTER — Emergency Department (HOSPITAL_COMMUNITY): Payer: BC Managed Care – PPO

## 2012-08-26 LAB — URINE CULTURE: Colony Count: 7000

## 2012-08-26 MED ORDER — OXYCODONE-ACETAMINOPHEN 5-325 MG PO TABS: 2.0000 | ORAL_TABLET | Freq: Once | ORAL | Status: DC

## 2012-08-26 MED ORDER — SULFAMETHOXAZOLE-TRIMETHOPRIM 800-160 MG PO TABS: 1.0000 | ORAL_TABLET | Freq: Two times a day (BID) | ORAL | Status: AC

## 2012-08-26 MED ORDER — IOHEXOL 300 MG/ML  SOLN
100.0000 mL | Freq: Once | INTRAMUSCULAR | Status: AC | PRN
  Administered 2012-08-26: 100 mL via INTRAVENOUS

## 2012-08-26 MED ORDER — OXYCODONE-ACETAMINOPHEN 5-325 MG PO TABS: 2.0000 | ORAL_TABLET | ORAL | Status: DC | PRN

## 2012-08-26 MED ORDER — SULFAMETHOXAZOLE-TMP DS 800-160 MG PO TABS
1.0000 | ORAL_TABLET | Freq: Once | ORAL | Status: AC
  Administered 2012-08-26: 1 via ORAL
  Filled 2012-08-26: qty 1

## 2012-08-26 MED ORDER — ONDANSETRON HCL 4 MG/2ML IJ SOLN
4.0000 mg | Freq: Once | INTRAMUSCULAR | Status: AC
  Administered 2012-08-26: 4 mg via INTRAVENOUS
  Filled 2012-08-26: qty 2

## 2012-08-26 MED ORDER — ONDANSETRON HCL 4 MG PO TABS: 4.0000 mg | ORAL_TABLET | Freq: Four times a day (QID) | ORAL | Status: DC

## 2012-08-26 NOTE — ED Provider Notes (Signed)
I saw and evaluated the patient, reviewed the resident's note and I agree with the findings and plan. Abdominal pain and back pain. Previous bladder tacking. Ct reassuring. D/c home  Juliet Rude. Rubin Payor, MD 08/26/12 1429

## 2012-08-26 NOTE — ED Provider Notes (Signed)
Seem from prior team. Patient with lower abdominal and back pain, history of bladder surgery. She had slight elevation in her white blood cell count. No fever, no vomiting. She has had nausea. Patient was pending CT scan. CT scan unremarkable. Will discharge home with pain and nausea medicine to followup with her primary care Dr.  Results for orders placed during the hospital encounter of 08/25/12  CBC WITH DIFFERENTIAL      Component Value Range   WBC 11.3 (*) 4.0 - 10.5 K/uL   RBC 4.23  3.87 - 5.11 MIL/uL   Hemoglobin 13.2  12.0 - 15.0 g/dL   HCT 16.1  09.6 - 04.5 %   MCV 91.5  78.0 - 100.0 fL   MCH 31.2  26.0 - 34.0 pg   MCHC 34.1  30.0 - 36.0 g/dL   RDW 40.9  81.1 - 91.4 %   Platelets 333  150 - 400 K/uL   Neutrophils Relative 56  43 - 77 %   Neutro Abs 6.3  1.7 - 7.7 K/uL   Lymphocytes Relative 30  12 - 46 %   Lymphs Abs 3.4  0.7 - 4.0 K/uL   Monocytes Relative 11  3 - 12 %   Monocytes Absolute 1.3 (*) 0.1 - 1.0 K/uL   Eosinophils Relative 3  0 - 5 %   Eosinophils Absolute 0.3  0.0 - 0.7 K/uL   Basophils Relative 0  0 - 1 %   Basophils Absolute 0.1  0.0 - 0.1 K/uL  BASIC METABOLIC PANEL      Component Value Range   Sodium 141  135 - 145 mEq/L   Potassium 3.7  3.5 - 5.1 mEq/L   Chloride 102  96 - 112 mEq/L   CO2 28  19 - 32 mEq/L   Glucose, Bld 99  70 - 99 mg/dL   BUN 14  6 - 23 mg/dL   Creatinine, Ser 7.82  0.50 - 1.10 mg/dL   Calcium 9.8  8.4 - 95.6 mg/dL   GFR calc non Af Amer >90  >90 mL/min   GFR calc Af Amer >90  >90 mL/min  URINALYSIS, ROUTINE W REFLEX MICROSCOPIC      Component Value Range   Color, Urine YELLOW  YELLOW   APPearance HAZY (*) CLEAR   Specific Gravity, Urine 1.012  1.005 - 1.030   pH 5.5  5.0 - 8.0   Glucose, UA NEGATIVE  NEGATIVE mg/dL   Hgb urine dipstick NEGATIVE  NEGATIVE   Bilirubin Urine NEGATIVE  NEGATIVE   Ketones, ur NEGATIVE  NEGATIVE mg/dL   Protein, ur NEGATIVE  NEGATIVE mg/dL   Urobilinogen, UA 0.2  0.0 - 1.0 mg/dL   Nitrite  NEGATIVE  NEGATIVE   Leukocytes, UA MODERATE (*) NEGATIVE  WET PREP, GENITAL      Component Value Range   Yeast Wet Prep HPF POC NONE SEEN  NONE SEEN   Trich, Wet Prep NONE SEEN  NONE SEEN   Clue Cells Wet Prep HPF POC NONE SEEN  NONE SEEN   WBC, Wet Prep HPF POC MANY (*) NONE SEEN  URINE MICROSCOPIC-ADD ON      Component Value Range   Squamous Epithelial / LPF FEW (*) RARE   WBC, UA 7-10  <3 WBC/hpf   RBC / HPF 0-2  <3 RBC/hpf   Bacteria, UA FEW (*) RARE   Ct Abdomen Pelvis W Contrast  08/26/2012  *RADIOLOGY REPORT*  Clinical Data: Back pain and lower abdominal pain.  CT ABDOMEN AND PELVIS WITH CONTRAST  Technique:  Multidetector CT imaging of the abdomen and pelvis was performed following the standard protocol during bolus administration of intravenous contrast.  Contrast: OMNIPAQUE IOHEXOL 300 MG/ML  SOLN  Comparison: 08/29/2005  Findings: Linear atelectasis in the lung bases.  The diffuse low attenuation change throughout the liver consistent with diffuse fatty infiltration.  No focal liver lesions identified.  The gallbladder is surgically absent.  The pancreas, spleen, adrenal glands, abdominal aorta, and retroperitoneal lymph nodes are unremarkable.  10 mm cyst in the upper pole of the left kidney is stable.  No hydronephrosis or solid mass in either kidney.  No pyelocaliectasis or ureterectasis.  The stomach, small bowel, and colon are not abnormally distended.  The colon is stool- filled.  Contrast material flows through to the cecum consistent with no small bowel obstruction.  No free air or free fluid in the abdomen.  Pelvis:  The uterus appears to be surgically absent.  No abnormal adnexal masses.  Bladder wall is not thickened.  No free or loculated pelvic fluid collections.  The appendix is not identified.  No diverticulitis.  Degenerative changes in the lumbar spine with normal alignment.  IMPRESSION: Diffuse fatty infiltration of the liver.  No acute process demonstrated in the  abdomen or pelvis.   Original Report Authenticated By: Burman Nieves, M.D.       Olivia Mackie, MD 08/26/12 [phone]

## 2012-08-27 LAB — GC/CHLAMYDIA PROBE AMP
CT Probe RNA: NEGATIVE
GC Probe RNA: NEGATIVE

## 2012-09-30 ENCOUNTER — Encounter: Payer: Self-pay | Admitting: Emergency Medicine

## 2012-09-30 ENCOUNTER — Ambulatory Visit (INDEPENDENT_AMBULATORY_CARE_PROVIDER_SITE_OTHER): Payer: BC Managed Care – PPO | Admitting: Emergency Medicine

## 2012-09-30 ENCOUNTER — Ambulatory Visit (HOSPITAL_COMMUNITY)
Admission: RE | Admit: 2012-09-30 | Discharge: 2012-09-30 | Disposition: A | Discharge status: Home / Self Care | Payer: BC Managed Care – PPO | Source: Ambulatory Visit | Attending: Family Medicine | Admitting: Family Medicine

## 2012-09-30 VITALS — BP 131/83 | HR 77 | Ht 65.0 in | Wt 202.0 lb

## 2012-09-30 DIAGNOSIS — M549 Dorsalgia, unspecified: Secondary | ICD-10-CM

## 2012-09-30 DIAGNOSIS — M25559 Pain in unspecified hip: Secondary | ICD-10-CM | POA: Insufficient documentation

## 2012-09-30 DIAGNOSIS — M545 Low back pain, unspecified: Secondary | ICD-10-CM | POA: Insufficient documentation

## 2012-09-30 MED ORDER — CYCLOBENZAPRINE HCL 10 MG PO TABS: 10.0000 mg | ORAL_TABLET | Freq: Three times a day (TID) | ORAL | Status: DC | PRN

## 2012-09-30 MED ORDER — MELOXICAM 15 MG PO TABS: 15.0000 mg | ORAL_TABLET | Freq: Every day | ORAL | Status: DC

## 2012-09-30 MED ORDER — TRAMADOL HCL 50 MG PO TABS: 50.0000 mg | ORAL_TABLET | Freq: Three times a day (TID) | ORAL | Status: DC | PRN

## 2012-09-30 MED ORDER — GABAPENTIN 800 MG PO TABS: 800.0000 mg | ORAL_TABLET | Freq: Three times a day (TID) | ORAL | Status: DC

## 2012-09-30 NOTE — Patient Instructions (Addendum)
It was nice to meet you! I'm sorry you have back pain. Take tylenol 1g 3 times daily. Take meloxicam daily.  DO NOT TAKE IBUPROFEN, ALEVE, OR ADVIL!! Use tramadol 1-2 tablets every 6-8 hours. Please do the back exercises once a day. Follow up in 2 weeks to go over labs and x-rays.

## 2012-09-30 NOTE — Assessment & Plan Note (Signed)
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  Will check lumbar films and right hip films.  Low back and SI joint exercises given.  Instructed to take tylenol 1g TID and meloxicam 15mg  daily.  Emphasized importance of not taking additional NSAIDs.  Tramadol given for pain as well.  Will check CMP given how much tylenol/ibuprfen she has been taking.  Will also check RF, although I expect this to be negative.  Discussed possibility of steroid injections in the future.  Follow up in 2 weeks to review labs and x-rays.

## 2012-09-30 NOTE — Progress Notes (Signed)
  Subjective:    [name], female    DOB: 1955-10-07, 57 y.o.   MRN: [id number]  HPI [name] is here for back pain.  She reports a long history of low back pain.  Also have some mid-back pain.  Recently the pain has been more persistent, no longer resolving with a heating pad, and seems to be spreading into her buttocks.  No bowel or bladder incontinence.  No saddle anesthesia.  No weakness or numbness.  No radicular symptoms.  Has been on lortab, percocet and lidoderm patches in the past with minimal improvement.  She has done PT previously, and is not willing to return.  Currently taking flexeril for leg cramps and gabapentin (which she says helps her deal with the pain, but does not take it away).  Pain is worse first thing in the morning when she gets up; better once she gets moving.  Taking tylenol 4-5 times a day as well as aleve 3-4 times a day.  Heating pad does help.  I have reviewed and updated the following as appropriate: allergies and current medications SHx: never smoker  Review of Systems See HPI    Objective:   Physical Exam BP 131/83  Pulse 77  Ht 5\' 5"  (1.651 m)  Wt 202 lb (91.627 kg)  BMI 33.61 kg/m2 Gen: alert, cooperative, no distress, some discomfort when getting off the exam table Back: no erythema or vertebral step-offs; mild tenderness over sacrum and SI joints, SLR negative bilaterally, FABERs positive bilaterally (R > L) Right hip: no erythema or edema; no point tenderness; mild pain with external rotation Neuro: 5/5 strength in bilateral lower extremities, gait normal, patellar reflex 2+ and symmetric; sensation grossly intact to light touch     Assessment & Plan:

## 2012-10-01 LAB — COMPREHENSIVE METABOLIC PANEL
ALT: 14 U/L (ref 0–35)
Alkaline Phosphatase: 88 U/L (ref 39–117)
CO2: 30 mEq/L (ref 19–32)
Creat: 0.56 mg/dL (ref 0.50–1.10)
Glucose, Bld: 103 mg/dL — ABNORMAL HIGH (ref 70–99)
Total Bilirubin: 0.3 mg/dL (ref 0.3–1.2)

## 2012-10-01 LAB — RHEUMATOID FACTOR: Rhuematoid fact SerPl-aCnc: <10 IU/mL (ref <=14)

## 2012-10-25 ENCOUNTER — Ambulatory Visit (INDEPENDENT_AMBULATORY_CARE_PROVIDER_SITE_OTHER): Payer: BC Managed Care – PPO | Admitting: Emergency Medicine

## 2012-10-25 ENCOUNTER — Encounter: Payer: Self-pay | Admitting: Emergency Medicine

## 2012-10-25 VITALS — BP 135/85 | HR 78 | Ht 65.0 in | Wt 207.8 lb

## 2012-10-25 DIAGNOSIS — IMO0002 Reserved for concepts with insufficient information to code with codable children: Secondary | ICD-10-CM

## 2012-10-25 DIAGNOSIS — R21 Rash and other nonspecific skin eruption: Secondary | ICD-10-CM

## 2012-10-25 DIAGNOSIS — M549 Dorsalgia, unspecified: Secondary | ICD-10-CM

## 2012-10-25 DIAGNOSIS — Z780 Asymptomatic menopausal state: Secondary | ICD-10-CM

## 2012-10-25 DIAGNOSIS — S32059D Unspecified fracture of fifth lumbar vertebra, subsequent encounter for fracture with routine healing: Secondary | ICD-10-CM

## 2012-10-25 MED ORDER — CYCLOBENZAPRINE HCL 10 MG PO TABS: 10.0000 mg | ORAL_TABLET | Freq: Three times a day (TID) | ORAL | Status: DC | PRN

## 2012-10-25 MED ORDER — TRAMADOL HCL 50 MG PO TABS: 50.0000 mg | ORAL_TABLET | Freq: Three times a day (TID) | ORAL | Status: DC | PRN

## 2012-10-25 MED ORDER — MELOXICAM 15 MG PO TABS: 15.0000 mg | ORAL_TABLET | Freq: Every day | ORAL | Status: DC

## 2012-10-25 NOTE — Assessment & Plan Note (Signed)
Sub acute L5 fracture and arthritis.  Doing well with the medications started previously, except at work where she is on her feet all day.  Will prescribe back brace to wear at work.  Will also check dexa scan as there is no known history of trauma.  Patient already taking Ca+VitD supplement.

## 2012-10-25 NOTE — Patient Instructions (Addendum)
It was nice to see you! You have a fracture in your back.  It looks like it happened 3-6 months ago and is starting to heal. Please continue the medicine we started last time. I have given you a prescription for a back brace.  Please wear this at work. I am going to check a DEXA scan to check the density of your bone. Follow up in 1 month.

## 2012-10-25 NOTE — Progress Notes (Signed)
  Subjective:    [name], female    DOB: 1956/03/09, 57 y.o.   MRN: [id number]  HPI [name] is here for back pain f/u.  She is her to review her labs and x-rays for back pain.  She reports that her back is doing well with teh medications, except with work.  She works in a [employer] and is on her feet all day.  By the end of the day, she is in a lot of pain.  Denies any trauma in the last 3-6 months.  Did have a fall at work 2 years ago, but x-rays at that time just showed arthritis.  No known risk factors for osteoporosis except post-menopausal.  Also developed an itchy rash on her back Saturday after working in the yard.  Has been using OTC hydrocortisone cream on it and the itch has resolved.  I have reviewed and updated the following as appropriate: allergies and current medications SHx: never smoker    Review of Systems See HPI    Objective:   Physical Exam BP 135/85  Pulse 78  Ht 5\' 5"  (1.651 m)  Wt 207 lb 12.8 oz (94.257 kg)  BMI 34.58 kg/m2 Gen: alert, cooperative, NAD Skin: patch of macular papular rash with excoriations on left back     Assessment & Plan:  Rash: exam consistent with a contact dermatitis.  Recommended continued hydrocortisone use and f/u if not improved after 1 week.

## 2012-10-29 ENCOUNTER — Ambulatory Visit
Admission: RE | Admit: 2012-10-29 | Discharge: 2012-10-29 | Disposition: A | Discharge status: Home / Self Care | Payer: BC Managed Care – PPO | Source: Ambulatory Visit | Attending: Family Medicine | Admitting: Family Medicine

## 2012-10-29 DIAGNOSIS — S32059D Unspecified fracture of fifth lumbar vertebra, subsequent encounter for fracture with routine healing: Secondary | ICD-10-CM

## 2012-10-29 DIAGNOSIS — Z780 Asymptomatic menopausal state: Secondary | ICD-10-CM

## 2012-10-29 DIAGNOSIS — M549 Dorsalgia, unspecified: Secondary | ICD-10-CM

## 2012-11-01 ENCOUNTER — Telehealth: Payer: Self-pay | Admitting: Emergency Medicine

## 2012-11-01 NOTE — Telephone Encounter (Signed)
Called and left message for patient to make an appt to discuss further treatment for bone issues.  We may need to get a vitamin D level.  Would also meet criteria for treatment with bisphosphonates.  May need to consider decreasing PPI as well.

## 2012-11-02 ENCOUNTER — Encounter: Payer: Self-pay | Admitting: Emergency Medicine

## 2012-11-02 NOTE — Telephone Encounter (Signed)
error 

## 2012-11-04 ENCOUNTER — Encounter: Payer: Self-pay | Admitting: Emergency Medicine

## 2012-11-04 ENCOUNTER — Ambulatory Visit (INDEPENDENT_AMBULATORY_CARE_PROVIDER_SITE_OTHER): Payer: BC Managed Care – PPO | Admitting: Emergency Medicine

## 2012-11-04 VITALS — BP 120/70 | HR 78 | Ht 65.0 in | Wt 208.7 lb

## 2012-11-04 DIAGNOSIS — M949 Disorder of cartilage, unspecified: Secondary | ICD-10-CM

## 2012-11-04 DIAGNOSIS — M549 Dorsalgia, unspecified: Secondary | ICD-10-CM

## 2012-11-04 DIAGNOSIS — M8580 Other specified disorders of bone density and structure, unspecified site: Secondary | ICD-10-CM | POA: Insufficient documentation

## 2012-11-04 DIAGNOSIS — E049 Nontoxic goiter, unspecified: Secondary | ICD-10-CM

## 2012-11-04 DIAGNOSIS — M899 Disorder of bone, unspecified: Secondary | ICD-10-CM

## 2012-11-04 MED ORDER — GABAPENTIN 800 MG PO TABS: 800.0000 mg | ORAL_TABLET | Freq: Three times a day (TID) | ORAL | Status: DC

## 2012-11-04 MED ORDER — ALENDRONATE SODIUM 70 MG PO TABS: 70.0000 mg | ORAL_TABLET | ORAL | Status: DC

## 2012-11-04 NOTE — Progress Notes (Signed)
  Subjective:    [name], female    DOB: August 20, 1955, 57 y.o.   MRN: [id number]  HPI [name] is here for f/u of DEXA scan.  I reviewed the results of her dexa scan with her and discussed management as in the problem list.  She also has a concern about her goiter.  She feels like it has gotten larger, particularly on the right side.  Denies any heat or cold intolerance, no skin or nail changes, no palpitations.  Does have some jitteriness, but no tremors.  I have reviewed and updated the following as appropriate: allergies, current medications and problem list SHx: never smoker  Review of Systems See HPI    Objective:   Physical Exam BP 120/70  Pulse 78  Ht 5\' 5"  (1.651 m)  Wt 208 lb 11.2 oz (94.666 kg)  BMI 34.73 kg/m2 Gen: alert, cooperative, NAD Neck: erythema just above the medial right clavicle; goiter palpated, no nodules appreciated     Assessment & Plan:

## 2012-11-04 NOTE — Assessment & Plan Note (Signed)
Subjective enlargement per patient.  Skin over goiter is erythematous.  Some vague hyperthyroid symptoms.  Will check TSH and thyroid ultrasound.

## 2012-11-04 NOTE — Assessment & Plan Note (Addendum)
Post-menopausal, but without other risk factors.  However, she has had a non-traumatic vertebral fracture and her T scores have decreased from several years ago.  Will check vitamin D level and start Fosamax 70mg  once a week. Reviewed side effects and complications of fosamax including how to take it, osteonecrosis of the jaw and femur fracture.  While waiting for labs to come back, she will continue her OTC vitamin D/Ca supplements.  Will give vitamin D replacement therapy if needed.

## 2012-11-04 NOTE — Patient Instructions (Addendum)
It was nice to see you! Please start Fosamax 1 tablet once a week. Continue your calcium supplement. I will call you with the results of your blood work and ultrasound when I get them back. Follow up in 3 months or sooner as needed.

## 2012-11-05 LAB — VITAMIN D 25 HYDROXY (VIT D DEFICIENCY, FRACTURES): Vit D, 25-Hydroxy: 23 ng/mL — ABNORMAL LOW (ref 30–89)

## 2012-11-09 ENCOUNTER — Telehealth: Payer: Self-pay | Admitting: Emergency Medicine

## 2012-11-09 DIAGNOSIS — IMO0002 Reserved for concepts with insufficient information to code with codable children: Secondary | ICD-10-CM

## 2012-11-09 DIAGNOSIS — M8580 Other specified disorders of bone density and structure, unspecified site: Secondary | ICD-10-CM

## 2012-11-09 DIAGNOSIS — E559 Vitamin D deficiency, unspecified: Secondary | ICD-10-CM

## 2012-11-09 MED ORDER — CHOLECALCIFEROL 1.25 MG (50000 UT) PO CAPS: 50000.0000 [IU] | ORAL_CAPSULE | ORAL | Status: DC

## 2012-11-09 NOTE — Telephone Encounter (Signed)
Called and spoke with patient.  Vitamin D level is slightly low.  Will vertebral fracture, will go ahead and replace with cholecalciferal 50,000units once a week x6 weeks with repeat lab in 7-8 weeks.  She will stop her other vitamin d supplement while taking the prescription.  She understands and agrees with plan.

## 2012-11-12 ENCOUNTER — Ambulatory Visit (HOSPITAL_COMMUNITY)
Admission: RE | Admit: 2012-11-12 | Discharge: 2012-11-12 | Disposition: A | Discharge status: Home / Self Care | Payer: BC Managed Care – PPO | Source: Ambulatory Visit | Attending: Family Medicine | Admitting: Family Medicine

## 2012-11-12 DIAGNOSIS — E049 Nontoxic goiter, unspecified: Secondary | ICD-10-CM

## 2012-11-16 ENCOUNTER — Telehealth: Payer: Self-pay | Admitting: Emergency Medicine

## 2012-11-16 NOTE — Telephone Encounter (Signed)
Called and left message for patient regarding thyroid ultrasound results.  Thyroid is enlarged, but there are no concerning nodules.  TSH is normal.  Will continue to monitor with yearly labs.

## 2013-04-27 ENCOUNTER — Telehealth: Payer: Self-pay | Admitting: Emergency Medicine

## 2013-04-27 NOTE — Telephone Encounter (Signed)
Pt called and would like a refill on tramadol to be left up front for pick up. JW

## 2013-04-27 NOTE — Telephone Encounter (Signed)
Will forward to Dr Honig 

## 2013-04-28 NOTE — Telephone Encounter (Signed)
Called pt.informed. Pt agreed. .Camillia Marcy  

## 2013-04-28 NOTE — Telephone Encounter (Signed)
She needs an appointment with me before I will refill the Tramadol.  I have not reassessed her since she started the medicine, and I am not comfortable continuing the medicine until I have seen her in the office.

## 2013-06-20 ENCOUNTER — Encounter: Payer: Self-pay | Admitting: Emergency Medicine

## 2013-06-20 ENCOUNTER — Ambulatory Visit (INDEPENDENT_AMBULATORY_CARE_PROVIDER_SITE_OTHER): Payer: BC Managed Care – PPO | Admitting: Emergency Medicine

## 2013-06-20 VITALS — BP 132/81 | HR 87 | Temp 98.1°F | Ht 65.0 in | Wt 210.0 lb

## 2013-06-20 DIAGNOSIS — M549 Dorsalgia, unspecified: Secondary | ICD-10-CM

## 2013-06-20 DIAGNOSIS — M8580 Other specified disorders of bone density and structure, unspecified site: Secondary | ICD-10-CM

## 2013-06-20 DIAGNOSIS — G894 Chronic pain syndrome: Secondary | ICD-10-CM

## 2013-06-20 DIAGNOSIS — M899 Disorder of bone, unspecified: Secondary | ICD-10-CM

## 2013-06-20 MED ORDER — ALENDRONATE SODIUM 70 MG PO TABS: 70.0000 mg | ORAL_TABLET | ORAL | Status: DC

## 2013-06-20 MED ORDER — DULOXETINE HCL 30 MG PO CPEP: 30.0000 mg | ORAL_CAPSULE | Freq: Every day | ORAL | Status: DC

## 2013-06-20 MED ORDER — CYCLOBENZAPRINE HCL 10 MG PO TABS: 10.0000 mg | ORAL_TABLET | Freq: Three times a day (TID) | ORAL | Status: DC | PRN

## 2013-06-20 MED ORDER — MELOXICAM 15 MG PO TABS: 15.0000 mg | ORAL_TABLET | Freq: Every day | ORAL | Status: DC

## 2013-06-20 MED ORDER — OMEPRAZOLE 40 MG PO CPDR: 40.0000 mg | DELAYED_RELEASE_CAPSULE | Freq: Every day | ORAL | Status: DC

## 2013-06-20 MED ORDER — GABAPENTIN 800 MG PO TABS: 800.0000 mg | ORAL_TABLET | Freq: Three times a day (TID) | ORAL | Status: DC

## 2013-06-20 NOTE — Assessment & Plan Note (Addendum)
Today mostly with left SI joint pain. Continue flexeril, gabapentin, and meloxicam. Stop tramadol. Start cymbalta 30mg  daily. She will contact social security about disability. Letter provided to keep her out of work for 4 weeks. Follow up in 2 weeks.

## 2013-06-20 NOTE — Progress Notes (Signed)
   Subjective:    [name], female    DOB: Dec 15, 1955, 57 y.o.   MRN: [id number]  HPI STAISHA WINIARSKI is here for f/u chronic pain and osteopenia.  Osteopenia She states that she completed the 50,000 units of vitamin D and is back on her daily supplement.  She is taking fosamax weekly without any trouble.    Chronic pain Multiple pain complaints today.  Mostly complaining of left low back pain.  Also with pain in bilateral arms and hands.  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  She is wearing her back brace and states it helps a little.  Heating pad helps the most.  No radiation, numbness, tingling, or weakness in the legs.  No changes in bowel or bladder.  She reports coming home and being in tears because of the pain.  She would like to apply for disability.  States the meloxicam and gabapentin and flexeril help some.  Tramadol does not help.  She has done PT with limited improvement.  She is unable to tolerate the home exercises secondary to pain.  I have reviewed and updated the following as appropriate: allergies and current medications SHx: never smoker   Review of Systems See HPI    Objective:   Physical Exam BP 132/81  Pulse 87  Temp(Src) 98.1 F (36.7 C) (Oral)  Ht 5\' 5"  (1.651 m)  Wt 210 lb (95.255 kg)  BMI 34.95 kg/m2 Gen: alert, cooperative, sitting a little stiffly Back: no erythema or swelling; no vertebral body tenderness; tender over left SI joint; negative SLR bilaterally; Faber positive on left Neuro: 5/5 strength in grip, dorsi-flexion and plantar-flexion; sensation grossly intact to light touch in all extremities      Assessment & Plan:

## 2013-06-20 NOTE — Assessment & Plan Note (Signed)
Will recheck vitamin D level. Continue with weekly fosamax and daily Ca-Vit D supplement. Will recheck dexa scan in 10/2013 and if improved, can consider stopping fosamax.

## 2013-06-20 NOTE — Patient Instructions (Signed)
It was nice to see you!  Please contact Social Security to start the disability process.  Stop the tramadol. Start cymbalta 1 pill once a day.  If you start having thoughts of hurting yourself, please stop the medicine.  Follow up in 2 weeks.

## 2013-06-21 LAB — VITAMIN D 25 HYDROXY (VIT D DEFICIENCY, FRACTURES): Vit D, 25-Hydroxy: 28 ng/mL — ABNORMAL LOW (ref 30–89)

## 2013-06-27 IMAGING — CR DG LUMBAR SPINE COMPLETE 4+V
5 series · 5 of 5 positions shown · non-contrast
Comparison: 11/09/2006, CT 08/26/2012

CLINICAL DATA: Low back pain

LUMBAR SPINE - COMPLETE 4+ VIEW

[t l-spine a.p.]
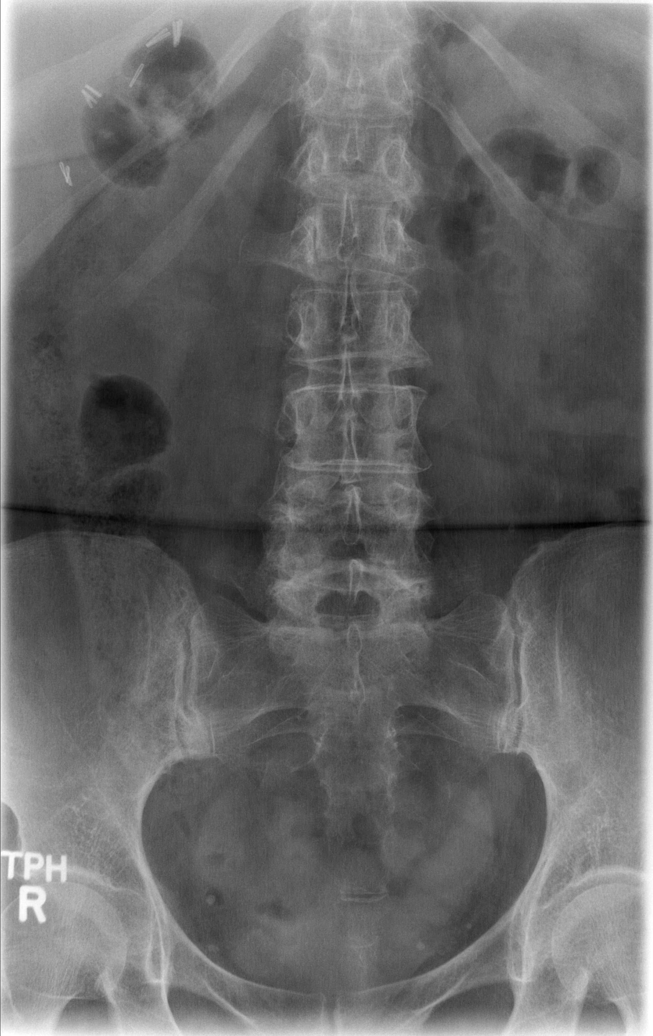

[t l-spine oblique exposure (1 of 2)]
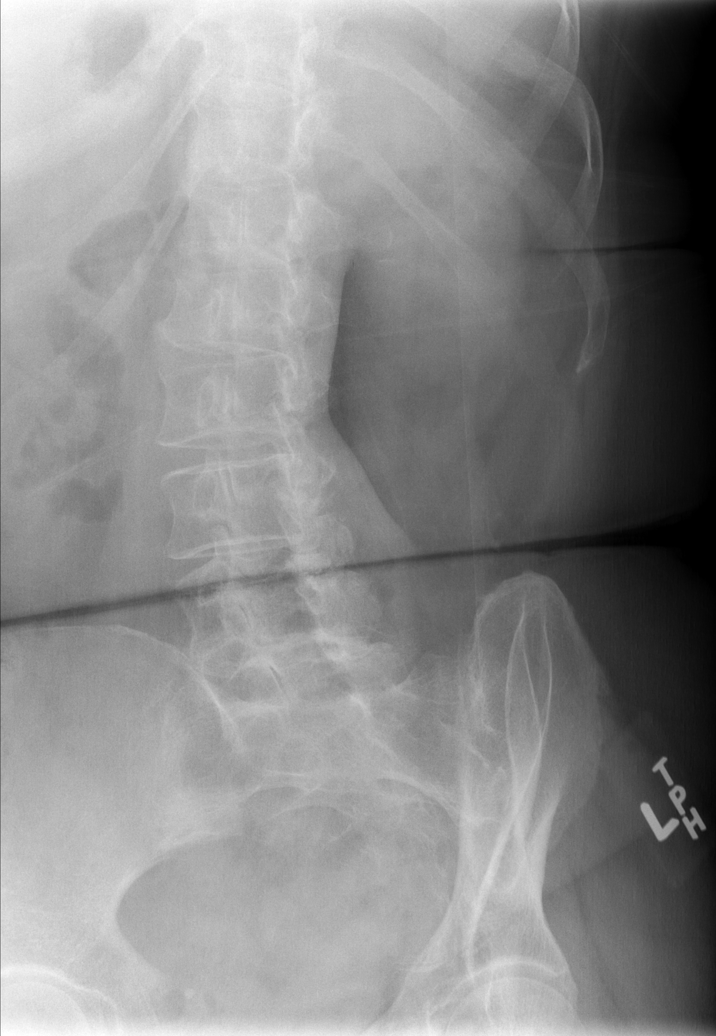

[t l-spine oblique exposure (2 of 2)]
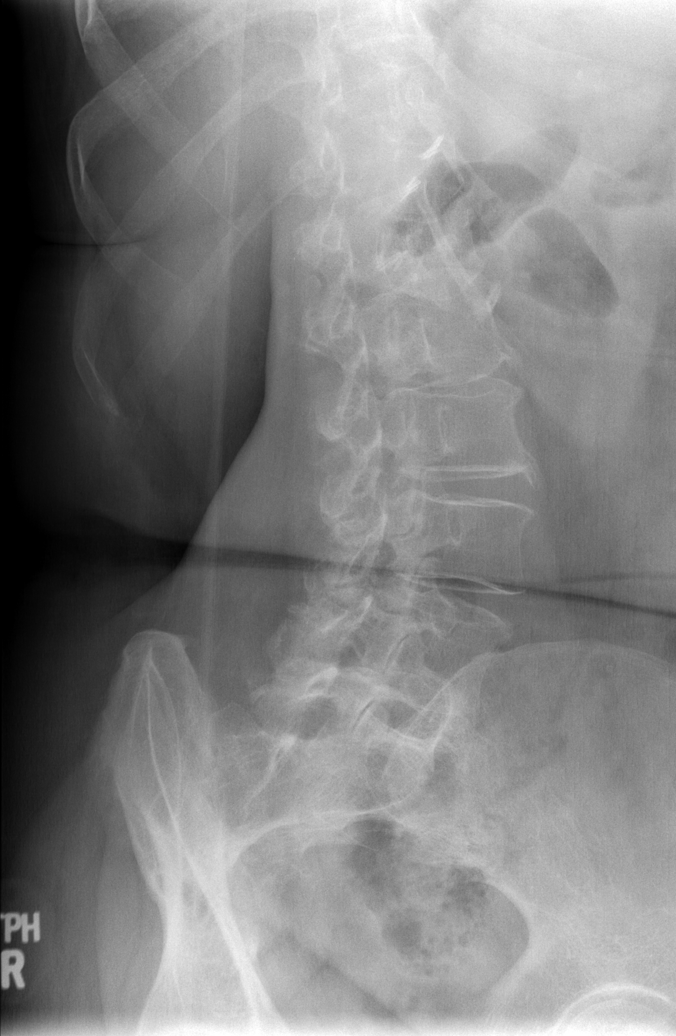

[t l-spine lat]
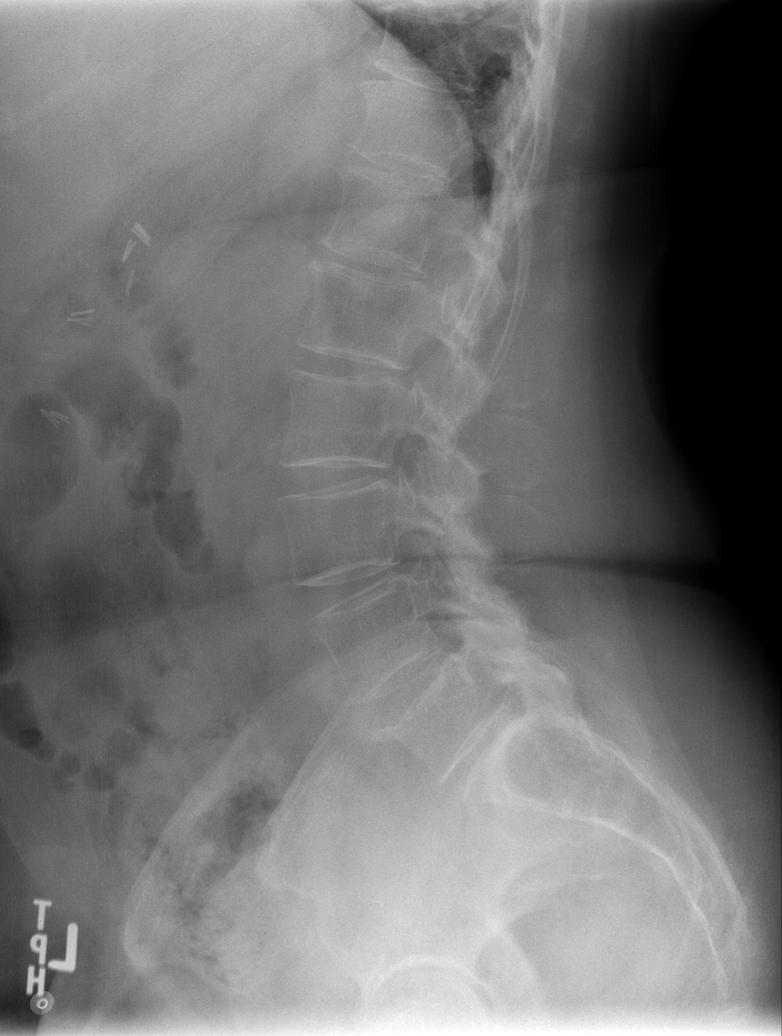

[t l-spine l5-s1 spot]
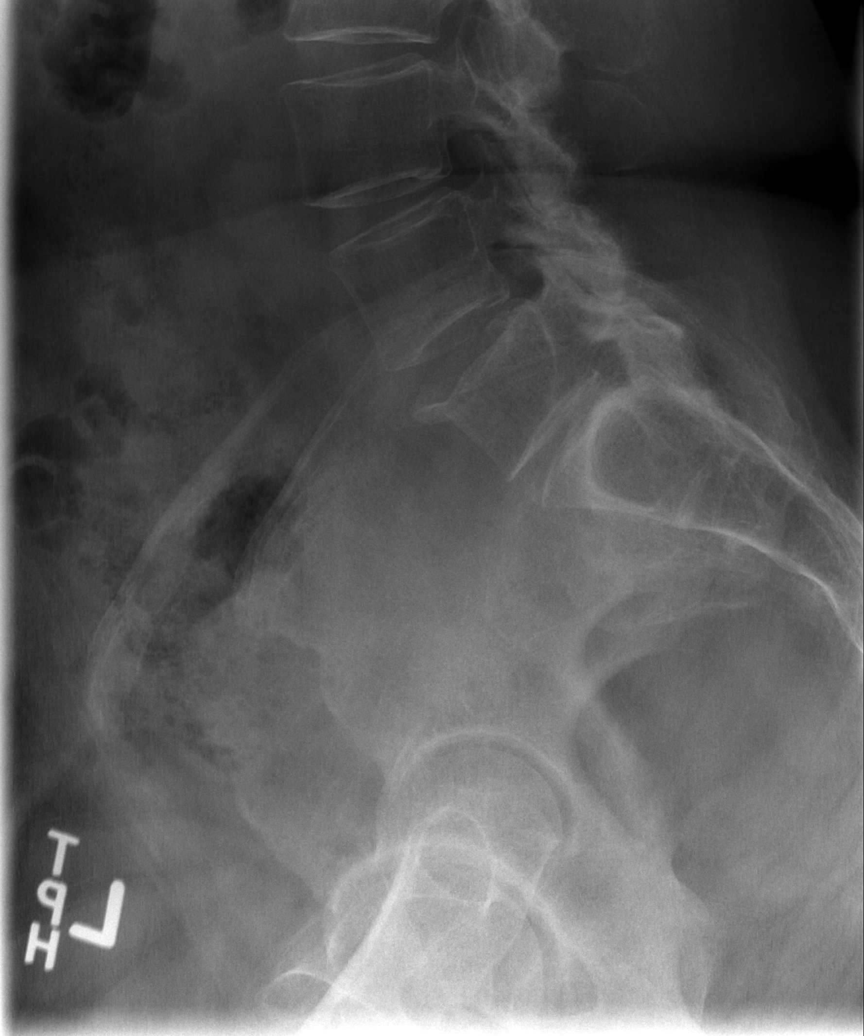

[5 of 5 positions shown; findings below may reference images not displayed]

FINDINGS: Mild fracture of the superior endplate of L5.  This was
not present in 6003 but was present on 08/26/2012.  This may be a
healing fracture and correlation with pain location is suggested.

No other fracture.  Normal alignment.  Facet degeneration at L3-4
and L4-5.
IMPRESSION: Mild fracture of L5 which may be a subacute healing  fracture.
This was present on the CT of 08/26/2012.

## 2013-07-11 ENCOUNTER — Ambulatory Visit (INDEPENDENT_AMBULATORY_CARE_PROVIDER_SITE_OTHER): Payer: BC Managed Care – PPO | Admitting: Emergency Medicine

## 2013-07-11 ENCOUNTER — Encounter: Payer: Self-pay | Admitting: Emergency Medicine

## 2013-07-11 VITALS — BP 118/77 | HR 81 | Temp 98.0°F | Ht 65.0 in | Wt 216.0 lb

## 2013-07-11 DIAGNOSIS — G894 Chronic pain syndrome: Secondary | ICD-10-CM

## 2013-07-11 DIAGNOSIS — E559 Vitamin D deficiency, unspecified: Secondary | ICD-10-CM | POA: Insufficient documentation

## 2013-07-11 DIAGNOSIS — F39 Unspecified mood [affective] disorder: Secondary | ICD-10-CM

## 2013-07-11 MED ORDER — VITAMIN D (ERGOCALCIFEROL) 1.25 MG (50000 UNIT) PO CAPS: 50000.0000 [IU] | ORAL_CAPSULE | ORAL | Status: DC

## 2013-07-11 MED ORDER — DULOXETINE HCL 60 MG PO CPEP: 60.0000 mg | ORAL_CAPSULE | Freq: Every day | ORAL | Status: DC

## 2013-07-11 NOTE — Patient Instructions (Signed)
It was nice to see you!  We are increasing the cymbalta to 60mg  daily.  For now you can take 2 tablets.  I sent in a new prescription for 60mg  capsules.  We are going to do the big dose vitamin D supplements again.  Take 1 tablet (50,000 units) once a week for 8 weeks.  Then resume your normal Calcium-Vit D supplement.  I will see you back in a month.

## 2013-07-11 NOTE — Assessment & Plan Note (Signed)
Still primarily left SI joint pain. No improvement on cymbalta yet. Increase cymbalta to 60mg  daily. Work note provided. F/u in 1 month.

## 2013-07-11 NOTE — Assessment & Plan Note (Signed)
Has noticed an improvement in her mood since starting cymbalta.  Less irritable.

## 2013-07-11 NOTE — Assessment & Plan Note (Signed)
Will replete with Vit D 50,000 units weekly x8 weeks again. Recheck level in 3 months.

## 2013-07-11 NOTE — Progress Notes (Signed)
   Subjective:    [name], female    DOB: [birth year], 56 y.o.   MRN: [id number]  HPI [name] is here for f/u of back pain.  Back pain She reports that her back pain is stable.  Present in left lower back.  Will spread across lower back.  No radiation down the legs.  No numbness, tingling or weakness in the legs.  Using meloxicam, gabapentin, and flexeril.  No improvement with cymbalta.  States heat helps the most.  Wearing back brace which also helps.  Mood Patient and daughter in law both state that her mood has improved on the cymbalta.  Mostly saying she is less irritable.  Osteopenia S/p Vit D repletion once.  Taking OTC Ca-Vit D supplement daily.  Rechecked level was still low.  I have reviewed and updated the following as appropriate: allergies and current medications SHx: non smoker  Health Maintenance: declined flu shot  Review of Systems See HPI    Objective:   Physical Exam BP 118/77  Pulse 81  Temp(Src) 98 F (36.7 C) (Oral)  Ht 5\' 5"  (1.651 m)  Wt 216 lb (97.977 kg)  BMI 35.94 kg/m2 Gen: alert, cooperative, NAD Back: tender over left SI joint; SLR negative bilaterally; FABER positive on left; no vertebral tenderness Neuro: 5/5 plantar and dorsi-flexion; 2+ and symmetric patellar reflexes     Assessment & Plan:

## 2013-08-01 ENCOUNTER — Telehealth: Payer: Self-pay | Admitting: Emergency Medicine

## 2013-08-01 NOTE — Telephone Encounter (Signed)
Completed form for disability.  Placed in to be faxed box.

## 2013-08-05 ENCOUNTER — Telehealth: Payer: Self-pay | Admitting: Emergency Medicine

## 2013-08-05 NOTE — Telephone Encounter (Signed)
Will fwd to MD for answers to questions.  Jameria Bradway, Loralyn Freshwater, [place]

## 2013-08-05 NOTE — Telephone Encounter (Signed)
[employer] Falling is needing more information regarding disability form for patient.  They need her limitations and diagnostic tests.  Please call them for more information.  They left a fax number [phone].

## 2013-08-08 NOTE — Telephone Encounter (Signed)
Attempted to call the disability office, but there office is closed today.  I will attempt to call them again later this week.

## 2013-08-10 NOTE — Telephone Encounter (Signed)
Spoke with representative.  They are currently waiting on the medical records.  I will forward to Juliann Pulse, who is working on this.

## 2013-08-15 ENCOUNTER — Telehealth: Payer: Self-pay | Admitting: Emergency Medicine

## 2013-08-15 NOTE — Telephone Encounter (Signed)
Pt called to let Dr. Bridgett Larsson know that all paperwork must be completed and sent in before 08/22/13 or she will be denied. jw

## 2013-08-15 NOTE — Telephone Encounter (Signed)
I will fwd to Dr. Bridgett Larsson.  I am not aware of any paperwork.  Jaymi Tinner, Loralyn Freshwater, [place]

## 2013-08-16 NOTE — Telephone Encounter (Signed)
Attempted to reach patient but phone number is disconnected.  [name], [name], [name]

## 2013-08-16 NOTE — Telephone Encounter (Signed)
I have completed the physician portion of the paperwork.  When I contacted the disability people, they are waiting on records from our office including progress notes and any imaging reports.

## 2013-08-16 NOTE — Telephone Encounter (Signed)
Spoke with Juliann Pulse.  She is aware of request having been made for records. They come today to copy and send off.  Damon Hargrove, Loralyn Freshwater, [place]

## 2013-08-18 ENCOUNTER — Encounter: Payer: Self-pay | Admitting: Emergency Medicine

## 2013-08-18 ENCOUNTER — Ambulatory Visit (INDEPENDENT_AMBULATORY_CARE_PROVIDER_SITE_OTHER): Payer: BC Managed Care – PPO | Admitting: Emergency Medicine

## 2013-08-18 VITALS — BP 119/75 | HR 89 | Temp 97.8°F | Ht 65.0 in | Wt 221.0 lb

## 2013-08-18 DIAGNOSIS — K047 Periapical abscess without sinus: Secondary | ICD-10-CM

## 2013-08-18 DIAGNOSIS — G894 Chronic pain syndrome: Secondary | ICD-10-CM

## 2013-08-18 MED ORDER — HYDROCODONE-ACETAMINOPHEN 5-325 MG PO TABS
1.0000 | ORAL_TABLET | Freq: Four times a day (QID) | ORAL | Status: DC | PRN
Start: 2013-08-18 — End: 2013-09-20

## 2013-08-18 MED ORDER — CAPSAICIN 0.075 % EX CREA: 1.0000 "application " | TOPICAL_CREAM | Freq: Four times a day (QID) | CUTANEOUS | Status: DC

## 2013-08-18 MED ORDER — PENICILLIN V POTASSIUM 500 MG PO TABS: 500.0000 mg | ORAL_TABLET | Freq: Four times a day (QID) | ORAL | Status: DC

## 2013-08-18 NOTE — Progress Notes (Signed)
   Subjective:    [name], female    DOB: [birth year], 57 y.o.   MRN: [id number]  HPI [name] is here for f/u chronic pain and dental pain.  1. Chronic pain: Reports that the pain is a little improved with the Cymbalta.  She also states that her mood is much better and she can deal with the pain better.  Pain remains primarily in the left lower back.  No radiation.  No weakness/numbness.  Worse with prolonged standing.  2. Dental pain: Reports several broken teeth that she is planning on having removed.  Started having some pain and swelling on right lower jaw and concerned that it is getting infected.  No fevers or vomiting.  Current Outpatient Prescriptions on File Prior to Visit  Medication Sig Dispense Refill  . alendronate (FOSAMAX) 70 MG tablet Take 1 tablet (70 mg total) by mouth every 7 (seven) days. Take with a full glass of water on an empty stomach.  4 tablet  11  . aspirin EC 81 MG tablet Take 81 mg by mouth daily.      . cyclobenzaprine (FLEXERIL) 10 MG tablet Take 1 tablet (10 mg total) by mouth 3 (three) times daily as needed. For muscle spasms  180 tablet  5  . docusate sodium (COLACE) 100 MG capsule Take 100 mg by mouth daily.      . DULoxetine (CYMBALTA) 60 MG capsule Take 1 capsule (60 mg total) by mouth daily.  30 capsule  6  . gabapentin (NEURONTIN) 800 MG tablet Take 1 tablet (800 mg total) by mouth 3 (three) times daily.  270 tablet  5  . meloxicam (MOBIC) 15 MG tablet Take 1 tablet (15 mg total) by mouth daily.  90 tablet  3  . omeprazole (PRILOSEC) 40 MG capsule Take 1 capsule (40 mg total) by mouth at bedtime.  30 capsule  6  . Psyllium (METAMUCIL PO) Take 1 scoop by mouth daily.      . Vitamin D, Ergocalciferol, (DRISDOL) 50000 UNITS CAPS capsule Take 1 capsule (50,000 Units total) by mouth every 7 (seven) days.  8 capsule  0   No current facility-administered medications on file prior to visit.    I have reviewed and updated the following  as appropriate: allergies and current medications SHx: never smoker   Review of Systems See HPI    Objective:   Physical Exam BP 119/75  Pulse 89  Temp(Src) 97.8 F (36.6 C) (Oral)  Ht 5\' 5"  (1.651 m)  Wt 221 lb (100.245 kg)  BMI 36.78 kg/m2 Gen: alert, cooperative, NAD Psych: normal mood and affect Mouth: broken 2nd molar on right lower jaw; surrounding erythema; tender to touch; swelling over right lower jaw     Assessment & Plan:

## 2013-08-18 NOTE — Patient Instructions (Signed)
It was nice to see you!  I'm glad you are doing better. I sent a prescription for a cream to use on your back - it's available over the counter as well.  Apply it 4 times a day. I will follow up on the records getting sent to the disability people.  Your broken tooth is getting infected. Take penicillin 1 pill 4 times a day for 10 days. I gave you a prescription for some pain medicine to use as needed.  Follow up in 3 months or sooner if needed.

## 2013-08-18 NOTE — Assessment & Plan Note (Signed)
Broken tooth as nidus. Planning on seeing her dentist. Penicillin 500mg  QID x10 days. Norco 5-325mg  #10 provided for prn use. F/u if not improved after antibiotics.

## 2013-08-18 NOTE — Assessment & Plan Note (Signed)
Some improved on the cymbalta. Will f/u on the paperwork for her disability. Start capsaicin cream to left SI area QID. F/u in 3 months or sooner as needed.

## 2013-08-19 NOTE — Telephone Encounter (Signed)
Pt told records were faxed.  Ralphael Southgate, Loralyn Freshwater, [place]

## 2013-08-19 NOTE — Telephone Encounter (Signed)
Pt is calling again to see if the records were faxed to [facility]. jw

## 2013-09-14 ENCOUNTER — Ambulatory Visit: Payer: BC Managed Care – PPO | Admitting: Emergency Medicine

## 2013-09-20 ENCOUNTER — Ambulatory Visit (INDEPENDENT_AMBULATORY_CARE_PROVIDER_SITE_OTHER): Payer: BC Managed Care – PPO | Admitting: Emergency Medicine

## 2013-09-20 ENCOUNTER — Encounter: Payer: Self-pay | Admitting: Emergency Medicine

## 2013-09-20 VITALS — BP 126/80 | HR 87 | Temp 98.2°F | Ht 65.0 in | Wt 231.0 lb

## 2013-09-20 DIAGNOSIS — H9201 Otalgia, right ear: Secondary | ICD-10-CM

## 2013-09-20 DIAGNOSIS — R21 Rash and other nonspecific skin eruption: Secondary | ICD-10-CM

## 2013-09-20 DIAGNOSIS — L309 Dermatitis, unspecified: Secondary | ICD-10-CM | POA: Insufficient documentation

## 2013-09-20 DIAGNOSIS — H9209 Otalgia, unspecified ear: Secondary | ICD-10-CM

## 2013-09-20 DIAGNOSIS — G894 Chronic pain syndrome: Secondary | ICD-10-CM

## 2013-09-20 MED ORDER — LORAZEPAM 2 MG PO TABS: 2.0000 mg | ORAL_TABLET | Freq: Four times a day (QID) | ORAL | Status: DC | PRN

## 2013-09-20 MED ORDER — NEOMYCIN-POLYMYXIN-HC 3.5-10000-1 OT SOLN: 3.0000 [drp] | Freq: Four times a day (QID) | OTIC | Status: DC

## 2013-09-20 MED ORDER — PREDNISONE 50 MG PO TABS: 50.0000 mg | ORAL_TABLET | Freq: Every day | ORAL | Status: DC

## 2013-09-20 MED ORDER — TRIAMCINOLONE ACETONIDE 0.5 % EX OINT: 1.0000 "application " | TOPICAL_OINTMENT | Freq: Two times a day (BID) | CUTANEOUS | Status: DC

## 2013-09-20 NOTE — Patient Instructions (Signed)
It was nice to see you!  We are going to work on getting you set up for an MRI and a referral to pain specialist who would be able to do injections.  The rash is some kind of inflammatory reaction - I don't know to what.  It does not look like anything bad. Take prednisone 50mg  once a day for 5 days. Use triamcinolone ointment twice a day for the next 1-2 weeks. Use the ear drops in your right ear.  If the rash is not getting better in the next week, please come back.  Otherwise, I will see you in 1 month.

## 2013-09-20 NOTE — Assessment & Plan Note (Signed)
Rash seems to be involving the external ear canal. Corticosporin drops given to use for the next week.

## 2013-09-20 NOTE — Assessment & Plan Note (Signed)
Appears to be inflammatory, contact type rash. No signs of infection or other red flags. Will treat with steroid burst given amount of skin involvement. Also provided triamcinolone ointment to use. F/u if not improving in 1 week.

## 2013-09-20 NOTE — Progress Notes (Signed)
Subjective:    [name], female    DOB: [birth year], 58 y.o.   MRN: [id number]  HPI [name] is here for f/u back pain and rash and right ear pain.  Back pain She reports no change in her pain.  Her mood is better on the cymbalta, but it is not helping the pain.  She has been using the capsaicin cream without improvement.  She states she can stand for about 10 minutes before the pain is unbearable and sit for about 1 hour.  Rash Reports a diffuse, itchy, painful rash that started about 2 weeks ago.  It started on her right hip, and is now present on upper arms, thighs, pannus, back and sides of breasts.  She denies changing any medications, detergents, lotions, soaps.  She did move about 5 weeks ago, but is living with her sister who does not have a rash.  No fevers, chills, drainage.    Right ear pain States her right ear is kind of "sore."  Started about 2 days ago.  No decrease in hearing or tinnitus.  Current Outpatient Prescriptions on File Prior to Visit  Medication Sig Dispense Refill  . alendronate (FOSAMAX) 70 MG tablet Take 1 tablet (70 mg total) by mouth every 7 (seven) days. Take with a full glass of water on an empty stomach.  4 tablet  11  . aspirin EC 81 MG tablet Take 81 mg by mouth daily.      . cyclobenzaprine (FLEXERIL) 10 MG tablet Take 1 tablet (10 mg total) by mouth 3 (three) times daily as needed. For muscle spasms  180 tablet  5  . docusate sodium (COLACE) 100 MG capsule Take 100 mg by mouth daily.      . DULoxetine (CYMBALTA) 60 MG capsule Take 1 capsule (60 mg total) by mouth daily.  30 capsule  6  . gabapentin (NEURONTIN) 800 MG tablet Take 1 tablet (800 mg total) by mouth 3 (three) times daily.  270 tablet  5  . HYDROcodone-acetaminophen (NORCO) 5-325 MG per tablet Take 1 tablet by mouth every 6 (six) hours as needed for moderate pain.  10 tablet  0  . meloxicam (MOBIC) 15 MG tablet Take 1 tablet (15 mg total) by mouth daily.  90 tablet  3    . omeprazole (PRILOSEC) 40 MG capsule Take 1 capsule (40 mg total) by mouth at bedtime.  30 capsule  6  . penicillin v potassium (VEETID) 500 MG tablet Take 1 tablet (500 mg total) by mouth 4 (four) times daily.  40 tablet  0  . Psyllium (METAMUCIL PO) Take 1 scoop by mouth daily.      . Vitamin D, Ergocalciferol, (DRISDOL) 50000 UNITS CAPS capsule Take 1 capsule (50,000 Units total) by mouth every 7 (seven) days.  8 capsule  0   No current facility-administered medications on file prior to visit.    I have reviewed and updated the following as appropriate: allergies and current medications SHx: never smoker   Review of Systems See HPI    Objective:   Physical Exam BP 126/80  Pulse 87  Temp(Src) 98.2 F (36.8 C) (Oral)  Ht 5\' 5"  (1.651 m)  Wt 231 lb (104.781 kg)  BMI 38.44 kg/m2 Gen: alert, cooperative, NAD HEENT: AT/[place], sclera white, MMM Neck: supple Skin: erythematous, eczematous rash with overlying excoriations over right hip, bilateral upper arms, right shoulder, inner thighs, pannus; no drainage or blisters  Assessment & Plan:

## 2013-09-20 NOTE — Assessment & Plan Note (Signed)
Pain remains the same.  Across low back, primarily over left SI joint. Will obtain MRI of the lumbar spine. Will also place referral to pain management for possible SI joint injection. Work note provided. F/u in 1 month.

## 2013-09-24 ENCOUNTER — Ambulatory Visit
Admission: RE | Admit: 2013-09-24 | Discharge: 2013-09-24 | Disposition: A | Discharge status: Home / Self Care | Payer: BC Managed Care – PPO | Source: Ambulatory Visit | Attending: Family Medicine | Admitting: Family Medicine

## 2013-09-24 DIAGNOSIS — G894 Chronic pain syndrome: Secondary | ICD-10-CM

## 2013-10-12 ENCOUNTER — Telehealth: Payer: Self-pay | Admitting: Emergency Medicine

## 2013-10-12 NOTE — Telephone Encounter (Signed)
Will fwd to MD.  Francene Mcerlean L, CMA  

## 2013-10-12 NOTE — Telephone Encounter (Signed)
Pt called and would like the results of her MRI. jw

## 2013-10-13 ENCOUNTER — Telehealth: Payer: Self-pay | Admitting: *Deleted

## 2013-10-13 NOTE — Telephone Encounter (Signed)
[employer] had calling on behalf on Dr. Truddie Coco. He is needing to talk to Dr. Bridgett Larsson about disability through a peer to peer conversation concerning patient's disability.

## 2013-10-13 NOTE — Telephone Encounter (Signed)
Left voicemail for patient with results of MRI.  Instructed her to call or make an appointment if she had additional questions.

## 2013-10-14 NOTE — Telephone Encounter (Signed)
Left voicemail for Dr. Truddie Coco.  If unable to get in touch today, I will try again next week.

## 2013-10-18 NOTE — Telephone Encounter (Signed)
Spoke with Dr. Julien Girt staff.  Call scheduled for this afternoon around 4:30.

## 2013-10-18 NOTE — Telephone Encounter (Signed)
Completed peer-to-peer with Dr. Truddie Coco.

## 2013-12-09 ENCOUNTER — Telehealth: Payer: Self-pay | Admitting: Emergency Medicine

## 2013-12-09 NOTE — Telephone Encounter (Signed)
Pt called and was approved for short term disability but needs her MRI results and any visits after March 3 faxed to the same person we faxed previous records too. Pt said we had the information in her charts jw

## 2013-12-13 NOTE — Telephone Encounter (Signed)
Faxed office note from 09/20/13 and results of MRI to [phone] per patient's request for her short term disability.Jaymes Graff Endia Moncur

## 2014-04-25 ENCOUNTER — Ambulatory Visit (INDEPENDENT_AMBULATORY_CARE_PROVIDER_SITE_OTHER): Payer: Self-pay | Admitting: Family Medicine

## 2014-04-25 ENCOUNTER — Encounter: Payer: Self-pay | Admitting: Family Medicine

## 2014-04-25 VITALS — BP 139/64 | HR 92 | Temp 97.9°F | Ht 65.0 in | Wt 243.8 lb

## 2014-04-25 DIAGNOSIS — M8580 Other specified disorders of bone density and structure, unspecified site: Secondary | ICD-10-CM

## 2014-04-25 DIAGNOSIS — M47896 Other spondylosis, lumbar region: Secondary | ICD-10-CM

## 2014-04-25 DIAGNOSIS — F39 Unspecified mood [affective] disorder: Secondary | ICD-10-CM

## 2014-04-25 DIAGNOSIS — E559 Vitamin D deficiency, unspecified: Secondary | ICD-10-CM

## 2014-04-25 DIAGNOSIS — K219 Gastro-esophageal reflux disease without esophagitis: Secondary | ICD-10-CM

## 2014-04-25 DIAGNOSIS — R251 Tremor, unspecified: Secondary | ICD-10-CM | POA: Insufficient documentation

## 2014-04-25 DIAGNOSIS — G894 Chronic pain syndrome: Secondary | ICD-10-CM

## 2014-04-25 LAB — BASIC METABOLIC PANEL
BUN: 13 mg/dL (ref 6–23)
CHLORIDE: 103 meq/L (ref 96–112)
CO2: 25 meq/L (ref 19–32)
CREATININE: 0.64 mg/dL (ref 0.50–1.10)
Calcium: 9 mg/dL (ref 8.4–10.5)
Glucose, Bld: 172 mg/dL — ABNORMAL HIGH (ref 70–99)
Potassium: 4 mEq/L (ref 3.5–5.3)
Sodium: 138 mEq/L (ref 135–145)

## 2014-04-25 MED ORDER — DULOXETINE HCL 60 MG PO CPEP: 60.0000 mg | ORAL_CAPSULE | Freq: Every day | ORAL | Status: DC

## 2014-04-25 MED ORDER — ALENDRONATE SODIUM 70 MG PO TABS: 70.0000 mg | ORAL_TABLET | ORAL | Status: DC

## 2014-04-25 MED ORDER — CYCLOBENZAPRINE HCL 10 MG PO TABS: 10.0000 mg | ORAL_TABLET | Freq: Three times a day (TID) | ORAL | Status: DC | PRN

## 2014-04-25 MED ORDER — GABAPENTIN 800 MG PO TABS: 800.0000 mg | ORAL_TABLET | Freq: Three times a day (TID) | ORAL | Status: DC

## 2014-04-25 MED ORDER — MELOXICAM 15 MG PO TABS: 15.0000 mg | ORAL_TABLET | Freq: Every day | ORAL | Status: DC

## 2014-04-25 MED ORDER — OMEPRAZOLE 40 MG PO CPDR: 40.0000 mg | DELAYED_RELEASE_CAPSULE | Freq: Every day | ORAL | Status: DC

## 2014-04-25 NOTE — Assessment & Plan Note (Addendum)
Stable, remains on bisphosphonate, Ca-Vit D supplement - last dexa 2014 with osteopenia - recent MRI with identified known compression fracture  Plan: 1. Continue Fosamax - printed 1 year rx 2. Check BMET 3. Consider re-check Vit D at next visit, consider prolonged course Ca-Vit D

## 2014-04-25 NOTE — Assessment & Plan Note (Signed)
Continue Omeprazole ?

## 2014-04-25 NOTE — Assessment & Plan Note (Signed)
Stable, chronic LBP L>R SI pain without radicular symptoms - Lumbar MRI (09/24/13) with Lumbar spondylosis and DJD with mild impingement L4-5, 35% chronic superior endplate L5 compression frx, mild grade 1 degen anterolisthesis L3-4 and L4-5 - Currently accepted for long term disability due to chronic LBP, up to 5-7 years  Plan: 1. Previously referred to Pain Management, has not received appointment / followed up yet 2. Reviewed results of MRI 3. Continue Mobic, Neurontin, Cymbalta, Flexeril - provided printed refills x 1 year 4. Advised starting regular Tylenol use, recommend extra strength 500mg  1-2 tabs up to TID (max 6 daily), conservative therapies with heating pad, stretching, regular exercise 5. RTC PRN

## 2014-04-25 NOTE — Assessment & Plan Note (Signed)
Stable, remains improved on Cymbalta

## 2014-04-25 NOTE — Assessment & Plan Note (Signed)
Will plan to re-check Vit D at next follow-up

## 2014-04-25 NOTE — Patient Instructions (Addendum)
Dear [name], Thank you for coming in to clinic today.  Today we discussed your Med Refills, Chronic Pain, Tremors. 1. For your Back Pain - it sounds like this is caused by your Chronic Arthritis and known Lower Back Degenerative Disease (on MRI), I recommend continuing your current pain regimen, as prescribed. - Also please start taking Tylenol regularly (Extra Strength 500mg  tablets - take 1-2 about 3 times daily, do not take more than 6 tablets in 24 hours) 2. 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 Continue to keep an eye on your tremors, we will discuss it again in detail next time, and consider a trial of medication. Return if you have significant worsening symptoms or any new symptoms  Some important numbers from today's visit: BP - 139/64  Checked basic labs today - we will mail you a letter with results.  Due for Mammogram and Colonoscopy screening - review these handouts and please schedule these appointments. Contact us if you have questions.  Please schedule a follow-up appointment with me (Dr. Parks Ranger) in 1 year for annual physical otherwise, please return sooner if you have any concerns, you can set up an appointment in 6 months to discuss tremor if needed.  If you have any other questions or concerns, please feel free to call the clinic to contact me. You may also schedule an earlier appointment if necessary.  However, if your symptoms get significantly worse, please go to the Emergency Department to seek immediate medical attention.  [name], DO [facility]   Tremor Tremor is a rhythmic, involuntary muscular contraction characterized by oscillations (to-and-fro movements) of a part of the body. The most common of all involuntary movements, tremor can affect various  body parts such as the hands, head, facial structures, vocal cords, trunk, and legs; most tremors, however, occur in the hands. Tremor often accompanies neurological disorders associated with aging. Although the disorder is not life-threatening, it can be responsible for functional disability and social embarrassment. TREATMENT  There are many types of tremor and several ways in which tremor is classified. The most common classification is by behavioral context or position. There are five categories of tremor within this classification: resting, postural, kinetic, task-specific, and psychogenic. Resting or static tremor occurs when the muscle is at rest, for example when the hands are lying on the lap. This type of tremor is often seen in patients with Parkinson's disease. Postural tremor occurs when a patient attempts to maintain posture, such as holding the hands outstretched. Postural tremors include physiological tremor, essential tremor, tremor with basal ganglia disease (also seen in patients with Parkinson's disease), cerebellar postural tremor, tremor with peripheral neuropathy, post-traumatic tremor, and alcoholic tremor. Kinetic or intention (action) tremor occurs during purposeful movement, for example during finger-to-nose testing. Task-specific tremor appears when performing goal-oriented tasks such as handwriting, speaking, or standing. This group consists of primary writing tremor, vocal tremor, and orthostatic tremor. Psychogenic tremor occurs in both older and younger patients. The key feature of this tremor is that it dramatically lessens or disappears when the patient is distracted. PROGNOSIS There are some treatment options available for tremor; the appropriate treatment depends on accurate diagnosis of the cause. Some tremors respond to treatment of the underlying condition, for example in some cases of psychogenic tremor treating the patient's underlying mental  problem may cause the tremor  to disappear. Also, patients with tremor due to Parkinson's disease may be treated with Levodopa drug therapy. Symptomatic drug therapy is available for several other tremors as well. For those cases of tremor in which there is no effective drug treatment, physical measures such as teaching the patient to brace the affected limb during the tremor are sometimes useful. Surgical intervention such as thalamotomy or deep brain stimulation may be useful in certain cases. Document Released: 06/27/2002 Document Revised: 09/29/2011 Document Reviewed: 07/07/2005 [facility] Patient Information 2015 [place], [place]. This information is not intended to replace advice given to you by your health care provider. Make sure you discuss any questions you have with your health care provider.

## 2014-04-25 NOTE — Progress Notes (Signed)
   Subjective:    [name], female    DOB: Oct 10, 1955, 58 y.o.   MRN: [id number]  Patient presents for follow-up requesting comprehensive medication refills, requested printed rx's good for 1 year. Also, patient discussed potentially changing PCP office to alternate [place] location closer to home in [place].  HPI  Tremors / Shakiness: - Reports new symptoms with bilateral hand tremors (Right worse Left), gradual onset over past 1 month, states tremors occur both at rest and with activity, does not seem to be getting worse and is about the same as when they started. She was initially concerned about medication side-effect such as Cymbalta however started this previously 6-7 months ago without problems. - Hx Stroke >10 years ago, residual symptoms of paresthesias and numbness on left - No caffeine intake. Denies EtOH and no smoking. Not sure of family history of tremors. - Denies weakness, numbness, tingling, pain in Right arm, headache, vision changes, leg weakness, urinary incontinence,   Chronic Pain Syndrome:  - Chronic LBP worsening over last 2-3 years, Lumbar MRI (March 2015) with significant L4-L5 DJD, prior compression fracture - Recently just received long-term disability 5-7 years - Chronic pain over left lower back and across lower back, described as sharp pain, worse with certain movements and positions, worse after prolonged sitting or standing, increased activity - Currently taking Mobic, Gabapentin, Cymbalta, Flexeril all with improvement. Not taking any regular Tylenol or other conservative therapies - Denies any numbness, weakness, radiating down legs, urinary incontinence or retention  Mood Disorder: - History of chronic mood disorder with prior depression - Currently reports improved mood, "feels mellow all the time" while on Cymbalta - Denies sadness, depression  GERD: - Reports controlled on Omeprazole - Denies any significant heartburn,  abdominal pain, cough  I have reviewed and updated the following as appropriate: allergies and current medications  Social Hx: - Denies smoking, EtOH  Review of Systems  See above HPI    Objective:   Physical Exam  BP 139/64  Pulse 92  Temp(Src) 97.9 F (36.6 C) (Oral)  Ht 5\' 5"  (1.651 m)  Wt 243 lb 12.8 oz (110.587 kg)  BMI 40.57 kg/m2  Gen - well-appearing, NAD HEENT - NCAT, PERRL, EOMI, oropharynx clear, MMM Neck - supple, non-tender Heart - RRR, no murmurs heard Abd - soft, NTND, no masses, +active BS MSK - back non-tender to palpation over spinous processes, mild tenderness to palpation left lower SI region with mild inc paraspinal / muscular hypertrophy compared to right. No significant pain with Left facetogenic loading. Seated SLR slump test negative for radicular symptoms. Ext - non-tender, no edema, peripheral pulses intact +2 b/l Skin - warm, dry, no rashes  Neuro - awake, alert, oriented, grossly non-focal, intact muscle strength 5/5 b/l, intact distal sensation to light touch, bilateral R>L hand shaking tremors does not appear to be constant at rest or intention, seems increased when patient is focused on it, gait normal     Assessment & Plan:   See specific A&P problem list for details.

## 2014-04-25 NOTE — Assessment & Plan Note (Signed)
Bilateral, R>L hand/forearm tremors over past 1 month without worsening or other associated symptoms. - Clinically not consistent with resting tremor or intention tremor - Suspect either medication side-effect (high dose Gabapentin or Cymbalta), possible Essential Tremor  Plan: 1. Monitor for progression or improvement 2. Consider titrating down Gabapentin at next visit if no improvement, if still not improved can consider empiric treatment for essential tremor with CCB such as topiramate or verapamil

## 2014-04-25 NOTE — Assessment & Plan Note (Signed)
Stable - See overview, chronic pain syndrome, osteopenia for details

## 2014-04-26 ENCOUNTER — Telehealth: Payer: Self-pay | Admitting: Family Medicine

## 2014-04-26 DIAGNOSIS — E559 Vitamin D deficiency, unspecified: Secondary | ICD-10-CM

## 2014-04-26 DIAGNOSIS — E1140 Type 2 diabetes mellitus with diabetic neuropathy, unspecified: Secondary | ICD-10-CM | POA: Insufficient documentation

## 2014-04-26 DIAGNOSIS — R739 Hyperglycemia, unspecified: Secondary | ICD-10-CM

## 2014-04-26 DIAGNOSIS — E119 Type 2 diabetes mellitus without complications: Secondary | ICD-10-CM | POA: Insufficient documentation

## 2014-04-26 NOTE — Telephone Encounter (Signed)
Called patient to discuss recent lab results from [place] 04/25/14. BMET checked for annual lab work while currently taking bisphosphonate, and hx vitamin D deficiency. Results unremarkable except significant elevated glucose 172 (chart review prior glucose values on BMET and CBG all 90 to 100), no prior A1c on record, no fasting lipid panel (last direct LDL 2011), also last Vit D checked 06/2013 (low at 28).  Discussed concern with elevated glucose and possibility of diabetes. Patient stated she did eat cereal prior (4-5am) on day of lab draw and was not fasting. Agreed to current plan to call and schedule Lab Only appointment within next 1-3 weeks to get more blood work (future orders placed for A1c, fasting lipid panel, and Vitamin D level), will call with results and determine if pt needs to come in to discuss further.  [name], [place], PGY-2

## 2014-05-02 ENCOUNTER — Other Ambulatory Visit (INDEPENDENT_AMBULATORY_CARE_PROVIDER_SITE_OTHER): Payer: Self-pay

## 2014-05-02 DIAGNOSIS — E559 Vitamin D deficiency, unspecified: Secondary | ICD-10-CM

## 2014-05-02 DIAGNOSIS — R739 Hyperglycemia, unspecified: Secondary | ICD-10-CM

## 2014-05-02 DIAGNOSIS — R7309 Other abnormal glucose: Secondary | ICD-10-CM

## 2014-05-02 LAB — POCT GLYCOSYLATED HEMOGLOBIN (HGB A1C): Hemoglobin A1C: 6.7

## 2014-05-02 NOTE — Progress Notes (Signed)
Drew FLP, Vit D, and A1c = 6.7%

## 2014-05-03 ENCOUNTER — Encounter: Payer: Self-pay | Admitting: Family Medicine

## 2014-05-03 ENCOUNTER — Telehealth: Payer: Self-pay | Admitting: Family Medicine

## 2014-05-03 DIAGNOSIS — E559 Vitamin D deficiency, unspecified: Secondary | ICD-10-CM

## 2014-05-03 LAB — LIPID PANEL
CHOL/HDL RATIO: 4.6 ratio
Cholesterol: 147 mg/dL (ref 0–200)
HDL: 32 mg/dL — ABNORMAL LOW (ref >39)
LDL Cholesterol: 55 mg/dL (ref 0–99)
Triglycerides: 301 mg/dL — ABNORMAL HIGH (ref <150)
VLDL: 60 mg/dL — AB (ref 0–40)

## 2014-05-03 LAB — VITAMIN D 25 HYDROXY (VIT D DEFICIENCY, FRACTURES): Vit D, 25-Hydroxy: 27 ng/mL — ABNORMAL LOW (ref 30–89)

## 2014-05-03 MED ORDER — CHOLECALCIFEROL 1.25 MG (50000 UT) PO CAPS: 50000.0000 [IU] | ORAL_CAPSULE | Freq: Every day | ORAL | Status: DC

## 2014-05-03 NOTE — Telephone Encounter (Signed)
Called patient to discuss recent lab work from 05/02/14 (lab only apt), last [place] 04/25/14, see results below. Significant for elevated A1c 6.7, recently with elevated random CBG 170, with A1c >6.5 considered diagnosis for diabetes mellitus, type 2, previously patient likely had been considered impaired fasting glucose. Fasting lipid panel with normal TC, elevated TG 301 (Hypertriglyceridemia), low HDL 32, normal low LDL 55. And Vitamin D level at 27, low (previously 28 06/2013) in known Vitamin D deficiency.  Discussed results with patient and answered questions. Advised her to start significantly improving her lifestyle modifications with healthy diet (reduce carbs, sugars, inc vegetables), stressed importance of regular walking exercise (plans to walk every other day) to help lower cholesterol and improve blood sugar. Hold on DM medications at this time, will discuss at next visit. Recommend starting Fish Oil OTC daily for triglycerides. Sent prescription for repeat course of Vitamin D deficiency treatment with Cholecalciferol D3 50,000 units weekly x 8 weeks, continue taking daily Calcium supplement. Recommended to schedule f/u appointment with me in 3 months for repeat Hemoglobin A1c, re-check Vitamin D level, and consider starting maintenance Vitamin D supplement.  Mailed letter with lab results and these recommendations to patient as well.  [name], [place], PGY-2  Results for orders placed in visit on 05/02/14 (from the past 24 hour(s))  POCT GLYCOSYLATED HEMOGLOBIN (HGB A1C)     Status: None   Collection Time    05/02/14  3:18 PM      Result Value Ref Range   Hemoglobin A1C 6.7    VITAMIN D 25 HYDROXY     Status: Abnormal   Collection Time    05/02/14  3:27 PM      Result Value Ref Range   Vit D, 25-Hydroxy 27 (*) 30 - 89 ng/mL   Narrative:    Performed at:  [address], [address]                [place],  [place] [address]  LIPID PANEL     Status: Abnormal   Collection Time    05/02/14  3:27 PM      Result Value Ref Range   Cholesterol 147  0 - 200 mg/dL   Triglycerides 301 (*) <150 mg/dL   HDL 32 (*) >39 mg/dL   Total CHOL/HDL Ratio 4.6     VLDL 60 (*) 0 - 40 mg/dL   LDL Cholesterol 55  0 - 99 mg/dL   Narrative:    Performed at:  [address], [address]                [place],  [address]

## 2014-05-19 ENCOUNTER — Other Ambulatory Visit: Payer: Self-pay | Admitting: *Deleted

## 2014-05-19 DIAGNOSIS — G894 Chronic pain syndrome: Secondary | ICD-10-CM

## 2014-05-21 NOTE — Telephone Encounter (Signed)
This refill request came to me but I thought you'd be better since your her PCP.

## 2014-05-22 MED ORDER — DULOXETINE HCL 60 MG PO CPEP: 60.0000 mg | ORAL_CAPSULE | Freq: Every day | ORAL | Status: DC

## 2014-08-04 ENCOUNTER — Telehealth: Payer: Self-pay | Admitting: Family Medicine

## 2014-08-04 NOTE — Telephone Encounter (Signed)
Patient is taking neurontin, flexeril, omeprazole, duloxetine, alendronate, meloxicam, vitamin d, asa, calcium 1200. Patient has [place]. Appointment given for 3/16 with Dr. Livia Snellen, [place].

## 2014-08-23 ENCOUNTER — Other Ambulatory Visit: Payer: Self-pay | Admitting: Family Medicine

## 2014-08-23 DIAGNOSIS — M8580 Other specified disorders of bone density and structure, unspecified site: Secondary | ICD-10-CM

## 2014-09-08 ENCOUNTER — Encounter: Payer: Self-pay | Admitting: Family Medicine

## 2014-10-04 ENCOUNTER — Ambulatory Visit: Payer: Self-pay | Admitting: Family Medicine

## 2014-10-21 ENCOUNTER — Emergency Department
Admit: 2014-10-21 | Disposition: A | Discharge status: Home / Self Care | Payer: Self-pay | Admitting: Emergency Medicine

## 2014-10-21 LAB — CBC WITH DIFFERENTIAL/PLATELET
Basophil #: 0.1 10*3/uL (ref 0.0–0.1)
Basophil %: 0.7 %
EOS PCT: 3 %
Eosinophil #: 0.3 10*3/uL (ref 0.0–0.7)
HCT: 40.3 % (ref 35.0–47.0)
HGB: 13.2 g/dL (ref 12.0–16.0)
LYMPHS ABS: 3.8 10*3/uL — AB (ref 1.0–3.6)
Lymphocyte %: 35 %
MCH: 31.5 pg (ref 26.0–34.0)
MCHC: 32.6 g/dL (ref 32.0–36.0)
MCV: 97 fL (ref 80–100)
MONOS PCT: 11.8 %
Monocyte #: 1.3 x10 3/mm — ABNORMAL HIGH (ref 0.2–0.9)
Neutrophil #: 5.3 10*3/uL (ref 1.4–6.5)
Neutrophil %: 49.5 %
PLATELETS: 254 10*3/uL (ref 150–440)
RBC: 4.18 10*6/uL (ref 3.80–5.20)
RDW: 15.4 % — ABNORMAL HIGH (ref 11.5–14.5)
WBC: 10.8 10*3/uL (ref 3.6–11.0)

## 2014-10-21 LAB — BASIC METABOLIC PANEL
ANION GAP: 4 — AB (ref 7–16)
BUN: 18 mg/dL
CALCIUM: 9 mg/dL
CHLORIDE: 107 mmol/L
CO2: 26 mmol/L
Creatinine: 0.54 mg/dL
EGFR (African American): >60
EGFR (Non-African Amer.): >60
GLUCOSE: 92 mg/dL
Potassium: 3.7 mmol/L
Sodium: 137 mmol/L

## 2014-10-21 LAB — URINALYSIS, COMPLETE
BLOOD: NEGATIVE
Bilirubin,UR: NEGATIVE
Glucose,UR: NEGATIVE mg/dL (ref 0–75)
KETONE: NEGATIVE
Leukocyte Esterase: NEGATIVE
Nitrite: NEGATIVE
PH: 5 (ref 4.5–8.0)
PROTEIN: NEGATIVE
SPECIFIC GRAVITY: 1.015 (ref 1.003–1.030)
WBC UR: 3 /HPF (ref 0–5)

## 2014-10-21 LAB — TROPONIN I

## 2015-01-24 ENCOUNTER — Ambulatory Visit (INDEPENDENT_AMBULATORY_CARE_PROVIDER_SITE_OTHER): Payer: 59 | Admitting: Family Medicine

## 2015-01-24 ENCOUNTER — Encounter: Payer: Self-pay | Admitting: Family Medicine

## 2015-01-24 VITALS — BP 129/75 | HR 92 | Temp 97.9°F | Ht 65.0 in | Wt 255.2 lb

## 2015-01-24 DIAGNOSIS — H8110 Benign paroxysmal vertigo, unspecified ear: Secondary | ICD-10-CM

## 2015-01-24 DIAGNOSIS — R251 Tremor, unspecified: Secondary | ICD-10-CM

## 2015-01-24 DIAGNOSIS — E119 Type 2 diabetes mellitus without complications: Secondary | ICD-10-CM

## 2015-01-24 DIAGNOSIS — G894 Chronic pain syndrome: Secondary | ICD-10-CM

## 2015-01-24 DIAGNOSIS — L309 Dermatitis, unspecified: Secondary | ICD-10-CM | POA: Diagnosis not present

## 2015-01-24 DIAGNOSIS — M47896 Other spondylosis, lumbar region: Secondary | ICD-10-CM | POA: Diagnosis not present

## 2015-01-24 DIAGNOSIS — Z23 Encounter for immunization: Secondary | ICD-10-CM | POA: Diagnosis not present

## 2015-01-24 LAB — POCT GLYCOSYLATED HEMOGLOBIN (HGB A1C): Hemoglobin A1C: 8

## 2015-01-24 MED ORDER — GABAPENTIN 800 MG PO TABS: 800.0000 mg | ORAL_TABLET | Freq: Three times a day (TID) | ORAL | Status: DC

## 2015-01-24 MED ORDER — TRIAMCINOLONE ACETONIDE 0.5 % EX CREA: 1.0000 "application " | TOPICAL_CREAM | Freq: Three times a day (TID) | CUTANEOUS | Status: DC

## 2015-01-24 MED ORDER — ONDANSETRON HCL 4 MG PO TABS: 4.0000 mg | ORAL_TABLET | Freq: Three times a day (TID) | ORAL | Status: DC | PRN

## 2015-01-24 MED ORDER — METFORMIN HCL 500 MG PO TABS: 500.0000 mg | ORAL_TABLET | Freq: Two times a day (BID) | ORAL | Status: DC

## 2015-01-24 MED ORDER — CYCLOBENZAPRINE HCL 10 MG PO TABS: 10.0000 mg | ORAL_TABLET | Freq: Three times a day (TID) | ORAL | Status: DC | PRN

## 2015-01-24 MED ORDER — MECLIZINE HCL 25 MG PO TABS: 25.0000 mg | ORAL_TABLET | Freq: Three times a day (TID) | ORAL | Status: DC | PRN

## 2015-01-24 MED ORDER — MECLIZINE HCL 25 MG PO TABS
25.0000 mg | ORAL_TABLET | Freq: Three times a day (TID) | ORAL | Status: DC | PRN
Start: 2015-01-24 — End: 2015-05-15

## 2015-01-24 NOTE — Progress Notes (Signed)
Subjective:    [name], female    DOB: November 09, 1955, 59 y.o.   MRN: [id number]  Patient presents with daughter to visit today for multiple concerns.  HPI  TREMORS, CHRONIC: - Last follow-up on 04/2014 with same complaint, initial visit at that time with b/l hand/arm tremors R>L started approx 03/2014 with gradual worsening, present at rest and activity. Considered to be possible side-effect from polypharmacy, pt opted to continue current med regimen. - Today here for follow-up, now continued worsening tremors in hands/arms R>L but bilaterally problematic, continues to be present at rest and with activity, feels some occasional associated "generalized body tremors" without actual whole body shaking. 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 No caffeine. Denies alcohol. No family h/o tremors - Recent new symptoms with vertigo and nausea/vomiting x 3 months - Never established with Neurologist - Admits memory loss (per daughter, gradual worsening) - Denies any fevers/chills, pain in upper ext, HAs, vision change, focal weakness, urinary incontinence, seizure activity  VERTIGO / NAUSEA: - Recently new diagnosis of Vertigo in 10/2014 at [facility], had episodes of nausea and vomiting, associated with reported room spinning. In ED reportedly had Head CT scan (negative per report, unable to view in chart review). Symptoms resolved and controlled with Meclizine. - Continues to take OTC Meclizine 25mg  up to TID PRN, also Zofran PRN nausea  CHRONIC PAIN SYNDROME: - Chronic LBP worsening >3 years, h/o Lumbar MRI (March 2015) with significant L4-L5 DJD, prior compression fracture. Currently on long-term disability >5 years. - Today complains of stable LBP across back, limiting her ambulation and daily activities. Requires assistance and unable to walk >200 ft without rest. Uses cane / walker PRN. - Requesting application for permanent handicap placard - Currently taking Mobic, Gabapentin, Cymbalta,  Flexeril all with improvement. Not taking any regular Tylenol or other conservative therapies - Limited exercise / ambulation due to chronic pain - Denies any numbness, weakness, radiating down legs, urinary incontinence or retention  CHRONIC DM, Type 2: - New diagnosis, advised in 04/2014, however did not follow-up for repeat labs. CBGs: Was using family members glucometer to check CBG, reportedly ranging from 150 to 14, requesting rx now for glucometer and DM testing supplies Meds: No prior medications or insulin therapy Not on ACEi / ARB yet Lifestyle: Diet (trying to reduce carbs, portion size, has not had formal DM diet education) / Exercise (limited due to chronic pain and poor ambulation) Denies hypoglycemia, polyuria, visual changes, numbness or tingling.  HM: - Previously declined mammogram and colonoscopy - willing to review resources and consider arranging these today - Due for PNA vax and TDap to receive today  I have reviewed and updated the following as appropriate: allergies and current medications  Social Hx: - Denies smoking, EtOH  Review of Systems  See above HPI    Objective:   Physical Exam  BP 129/75 mmHg  Pulse 92  Temp(Src) 97.9 F (36.6 C) (Oral)  Ht 5\' 5"  (1.651 m)  Wt 255 lb 3.2 oz (115.758 kg)  BMI 42.47 kg/m2  Gen - obese, chronically ill appearing but currently well, cooperative, NAD HEENT - NCAT, PERRL, EOMI, patent nares w/o congestion, oropharynx clear, MMM Neck - supple, non-tender, no LAD, no thyromegaly Heart - RRR, no murmurs heard Lungs - CTAB, no wheezing, crackles, or rhonchi. Normal work of breathing. Abd - soft, NTND, no masses, +active BS MSK - Back - no obvious deformity, non-tender over spine, mild +TTP b/l lower back L>R Lumbar  paraspinal and SI region. Negative seated SLR for radiculopathy. Ext - non-tender, no edema, peripheral pulses intact +2 b/l Skin - warm, dry, no rashes Neuro - awake, alert, orientedx3, grossly  non-focal, intact muscle strength 5/5 b/l grip, biceps, knee flex, ankle dorsiflex, intact distal sensation to light touch, bilateral R>L hand shaking tremors worsened from last exam 04/2014 now regular at rest and with intention activity, slight improvement with distraction but persistent     Assessment & Plan:   See specific A&P problem list for details.

## 2015-01-24 NOTE — Assessment & Plan Note (Signed)
Refilled Triamcinolone per request today

## 2015-01-24 NOTE — Assessment & Plan Note (Signed)
Worsening progression x 8-9 months of bilateral hand/arm fine tremors at rest and activity. No other focal deficits. Known h/o CVA 10 yr ago with residual L weakness. Not clearly characteristic of resting or essential tremors. However concern with new symptoms vertigo/nausea x 3 months and reported memory loss. Differential broad, includes possible Parkinsonian symptoms, Lewy body dementia, still consider polypharmacy (high dose neurontin, cymbalta) - Recent imaging Head CT at [facility] (not available in chart)  Plan: 1. Referral to Neurology today (for tremors, vertigo, memory loss) 2. Advised recommendation to titrate down on meds, specifically Gabapentin, however pt opts to see Neurology first prior to adjusting meds.

## 2015-01-24 NOTE — Assessment & Plan Note (Addendum)
New diagnosis DM2 04/2014 with elevated A1c >6.5, did not follow-up as instructed. No prior treatment for DM. Using family member CBG supplies occasional checks ranging 150-250. Current HgbA1c 8.0 (01/19/15), last HgbA1c 6.7 (04/2014). No complications or hypoglycemia.  Plan:  1. Start Metformin therapy today - 500mg  BID wc, anticipate titrate up to 1000mg  BID as tolerated 2. Encouraged start lifestyle Mods - counseling on DM diet (dec carbs, inc vegs), concern with limited exercise due to chronic pain / limited ambulation. 3. Received PNA vax today 4. Phone in glucometer and TID testing supplies to [facility] - OneTouch Ultra Meter, Lancets #100 and Test Strips #100, both with +12 refills for 1 year supply. Advised should be covered for [facility] ins. 5. RTC 3 months, will need nutrition/DM counseling referral, review CBG logs, adjust metformin, DM foot exam, ophtho referral, discussed anticipated plan to start low dose ACEi, mod intensity statin - defer to next DM f/u

## 2015-01-24 NOTE — Assessment & Plan Note (Signed)
Stable, chronic LBP without radicular symptoms or complication. - Known long-term disability  Plan: 1. Referral placed to Pain Management 2. Continue current med regimen with Mobic, Neurontin, Cymbalta, Flexeril - provided refill for Flexeril #180 and Neurontin #270, advised pt that high quantity rx may not be covered by insurance. 3. Add tylenol PRN (has not started) 4. Completed application for permanent handicap placard as requested, returned to patient today

## 2015-01-24 NOTE — Patient Instructions (Addendum)
Dear [name], Thank you for coming in to clinic today.  1. Vertigo / Tremors - Referral placed to Neurology specialist to follow-up on tremors and vertigo, they may do repeat Head Imaging - please remember to request records from [facility] to send to their office once you are established. - Refilled Zofran and Meclizine 2. For your Chronic Back Pain - it sounds like this is caused by your Chronic Arthritis and known Lower Back Degenerative Disease (on MRI), I recommend continuing your current pain regimen, as prescribed. - Refilled Flexeril and Neurontin as requested - Referral placed to Pain Management - you will be called with appointment - Also may try Tylenol regularly (Extra Strength 500mg  tablets - take 1-2 about 3 times daily, do not take more than 6 tablets in 24 hours)  3. We are considering you to have Diabetes, Type 2 - from previous results - Check 3 month sugar today again - Start Metformin 500mg  twice daily (with food, may cause upset stomach for few weeks) - Will send DM test supplies and meter to pharmacy by phone, call if any issues picking these up - keep detailed log of blood sugars, check fasting in morning (first thing before eating), and then 2 hours after lunch and 2 hours after dinner / bed time   Some important numbers from today's visit: BP - 129/75  Checked 3 month blood sugar today  Due for Mammogram and Colonoscopy screening - review these handouts and please schedule these appointments. Contact us if you have questions.  Tetatnus TDap Shot today and Pneumonia Vaccine  Please schedule a follow-up appointment with me (Dr. Parks Ranger) in 3 months for Diabetes Follow-up.  Consider Geriatrics Clinic Follow-up to further discuss Memory Loss in future (if not addressed by Neurologist)  If you have any other questions or concerns, please feel free to call the clinic to contact me. You may also schedule an earlier appointment if necessary.  However,  if your symptoms get significantly worse, please go to the Emergency Department to seek immediate medical attention.  [name], [place]

## 2015-01-24 NOTE — Assessment & Plan Note (Signed)
Recently diagnosed 10/2014 at [facility], vertigo with associated n/v. Controlled on Meclizine and Zofran. - Reported Head CT 10/2014 at [facility] negative  Plan: 1. Refilled Meclizine 25mg  TID PRN, Zofran 4mg  PO PRN - printed rx given refills 2. Referral to Neurology - see details under tremor, concerned may need further work-up including MRI

## 2015-02-22 ENCOUNTER — Ambulatory Visit (INDEPENDENT_AMBULATORY_CARE_PROVIDER_SITE_OTHER): Payer: 59 | Admitting: Neurology

## 2015-02-22 ENCOUNTER — Encounter: Payer: Self-pay | Admitting: Neurology

## 2015-02-22 VITALS — BP 130/82 | HR 99 | Ht 65.0 in | Wt 258.0 lb

## 2015-02-22 DIAGNOSIS — R413 Other amnesia: Secondary | ICD-10-CM | POA: Diagnosis not present

## 2015-02-22 DIAGNOSIS — R269 Unspecified abnormalities of gait and mobility: Secondary | ICD-10-CM

## 2015-02-22 DIAGNOSIS — G43709 Chronic migraine without aura, not intractable, without status migrainosus: Secondary | ICD-10-CM | POA: Diagnosis not present

## 2015-02-22 DIAGNOSIS — G4710 Hypersomnia, unspecified: Secondary | ICD-10-CM

## 2015-02-22 DIAGNOSIS — R251 Tremor, unspecified: Secondary | ICD-10-CM | POA: Diagnosis not present

## 2015-02-22 MED ORDER — PROPRANOLOL HCL 60 MG PO TABS: 60.0000 mg | ORAL_TABLET | Freq: Three times a day (TID) | ORAL | Status: DC

## 2015-02-22 NOTE — Progress Notes (Addendum)
PATIENT: [name] DOB: [birth year]  Chief Complaint  Patient presents with  . Dizziness    Orthostatic BP:  Lying: 132/80, 93, Sitting: 130/82, 99, Standing: 130/75, 95. She is here with her daugher-in-law, Estill Bamberg, to have her dizzy spells evaluated.  The spells are often accompanied with headaches and nausea. She started having the symptoms in March 2016.  It is worse with positional changes.  . Tremors    Reports a worsening tremor in her right hand present for at least six months.  She is having problems with gripping items and it causes her difficulty when driving.  . Memory Loss    MMSE 28/30 - 7 animals.  She has increasingly worse memory loss.  She gives the example of  getting lost while driving or forgetting where she parked her car.     HISTORICAL  [name] is a 59 years old right-handed female, accompanied by her daughter-in-law, seen in refer by Dr. Olin Hauser for evaluation of tremor, dizziness, and memory loss  I have reviewed her most recent office evaluation in January 24 2015, she had a past medical history of hypertension, diabetes, hyperlipidemia, stroke, affecting the left side of her body, chronic pain, kidney stone  She had a history of migraine headaches in the past, but has not had her typical migraine for many years, since February 2016, she began to has frequent headaches, with associated dizziness, nausea, sometimes wake her up from sleep, with severe pounding headaches at bilateral region, lasting for hours, with associated dizziness, nausea, light noise sensitivity, she has been taking meclizine as needed, only limited help, light on sleeping resting has been helpful  She also complains of gradual onset memory trouble, got lost while driving, tends to repeat herself,  She has long-standing history of right hand tremor, getting worse since 2015, cause social embarrassment, drop things from her right hand, spill her cup of water.  She denies  loss sense of smell, no REM sleep disorder,  She does endorse loud snoring, excessive daytime sleepiness, today's PSA score is 17   I have personally reviewed CAT scan in April second 2016 that was normal MRI lumbar in March 2015, mild degenerative disc disease, no significant foraminal or canal stenosis. I reviewed laboratory evaluation in 2016, A1c was elevated 8.0, normal CBC, elevated triglycerides 301, normal TSH   REVIEW OF SYSTEMS: Full 14 system review of systems performed and notable only for weight gain, fatigue, spinning sensation, trouble swallowing, eye pain, incontinence, diarrhea, constipation, urination problems, incontinence, easy bruising, increased thirst, flushing, joint pain, cramps, achy muscles, memory loss, confusion, headaches, numbness, weakness, difficulty swallowing, dizziness, snoring, restless leg, tremor, depression, anxiety, decreased energy, racing thoughts.   ALLERGIES: No Known Allergies  HOME MEDICATIONS: Current Outpatient Prescriptions  Medication Sig Dispense Refill  . alendronate (FOSAMAX) 70 MG tablet TAKE 1 TABLET EVERY 7 DAYS 4 tablet 5  . aspirin EC 81 MG tablet Take 81 mg by mouth daily.    . Cholecalciferol 50000 UNITS capsule Take 1 capsule (50,000 Units total) by mouth daily. 8 capsule 0  . cyclobenzaprine (FLEXERIL) 10 MG tablet Take 1 tablet (10 mg total) by mouth 3 (three) times daily as needed for muscle spasms. 180 tablet 5  . docusate sodium (COLACE) 100 MG capsule Take 100 mg by mouth daily.    . DULoxetine (CYMBALTA) 60 MG capsule Take 1 capsule (60 mg total) by mouth daily. 30 capsule 11  . gabapentin (NEURONTIN) 800 MG tablet  Take 1 tablet (800 mg total) by mouth 3 (three) times daily. 270 tablet 5  . meclizine (ANTIVERT) 25 MG tablet Take 1 tablet (25 mg total) by mouth 3 (three) times daily as needed for dizziness. 90 tablet 2  . meloxicam (MOBIC) 15 MG tablet Take 1 tablet (15 mg total) by mouth daily. 30 tablet 11  . metFORMIN  (GLUCOPHAGE) 500 MG tablet Take 1 tablet (500 mg total) by mouth 2 (two) times daily with a meal. 60 tablet 5  . omeprazole (PRILOSEC) 40 MG capsule Take 1 capsule (40 mg total) by mouth at bedtime. 30 capsule 11  . ondansetron (ZOFRAN) 4 MG tablet Take 1 tablet (4 mg total) by mouth every 8 (eight) hours as needed for nausea or vomiting. 30 tablet 2  . Psyllium (METAMUCIL PO) Take 1 scoop by mouth daily.    Marland Kitchen triamcinolone cream (KENALOG) 0.5 % Apply 1 application topically 3 (three) times daily. 30 g 2   No current facility-administered medications for this visit.    PAST MEDICAL HISTORY: Past Medical History  Diagnosis Date  . Kidney stone   . Stroke   . Memory loss   . Tremors of nervous system   . Vertigo   . Plantar fasciitis   . Arthritis     PAST SURGICAL HISTORY: Past Surgical History  Procedure Laterality Date  . Bladder surgery    . Cholecystectomy    . Appendectomy    . Total abdominal hysterectomy    . Foot surgery      FAMILY HISTORY: Family History  Problem Relation Age of Onset  . Healthy Mother   . Heart disease Father     SOCIAL HISTORY:  History   Social History  . Marital Status: Single    Spouse Name: N/A  . Number of Children: 4  . Years of Education: HS   Occupational History  . Unemployed    Social History Main Topics  . Smoking status: Never Smoker   . Smokeless tobacco: Not on file  . Alcohol Use: No  . Drug Use: No  . Sexual Activity: Not on file   Other Topics Concern  . Not on file   Social History Narrative   Lives at home alone.   Right-handed.   Rarely uses caffeine.     PHYSICAL EXAM   Filed Vitals:   02/22/15 0956  BP: 130/82  Pulse: 99  Height: 5\' 5"  (1.651 m)  Weight: 258 lb (117.028 kg)    Not recorded      Body mass index is 42.93 kg/(m^2).  PHYSICAL EXAMNIATION:  Gen: NAD, conversant, well nourised, obese, well groomed                     Cardiovascular: Regular rate rhythm, no peripheral  edema, warm, nontender. Eyes: Conjunctivae clear without exudates or hemorrhage Neck: Supple, no carotid bruise. Pulmonary: Clear to auscultation bilaterally   NEUROLOGICAL EXAM:  MENTAL STATUS: Speech:    Speech is normal; fluent and spontaneous with normal comprehension.  Cognition: MMSE 28/30, animal naming 7     Orientation to time ,  Person, she is not oriented to office     Recent and remote memory, missed one out of 3 recalls     Normal Attention span and concentration     Normal Language, naming, repeating,spontaneous speech     Fund of knowledge   CRANIAL NERVES: CN II: Visual fields are full to confrontation. Fundoscopic exam is normal with  sharp discs and no vascular changes. Pupils are round equal and briskly reactive to light. CN III, IV, VI: extraocular movement are normal. No ptosis. CN V: Facial sensation is intact to pinprick in all 3 divisions bilaterally. Corneal responses are intact.  CN VII: Face is symmetric with normal eye closure and smile. CN VIII: Hearing is normal to rubbing fingers CN IX, X: Palate elevates symmetrically. Phonation is normal. CN XI: Head turning and shoulder shrug are intact CN XII: Tongue is midline with normal movements and no atrophy.  MOTOR: She has right more than left posturing tremor, no rigidity, no muscle weakness,   REFLEXES: Reflexes are 2+ and symmetric at the biceps, triceps, knees, and ankles. Plantar responses are flexor.  SENSORY: Mildly dense dependent decreased  light touch, pinprick  COORDINATION: Rapid alternating movements and fine finger movements are intact. There is no dysmetria on finger-to-nose and heel-knee-shin.    GAIT/STANCE: Posture is normal. Gait is steady with normal steps, base, arm swing, and turning. Heel and toe walking are normal. Tandem gait is normal.  Romberg is absent.   DIAGNOSTIC DATA (LABS, IMAGING, TESTING) - I reviewed patient records, labs, notes, testing and imaging myself where  available.   ASSESSMENT AND PLAN  KAMEAH RAWL is a 59 y.o. female    headaches with associated dizziness, nausea,   most consistent with migraine headaches,  She has a history of kidney stone, will try Inderal 60 mg twice a day as preventive medications  Tremor, right worse than left,  Exaggerated physiological tremor, hope beta blocker will be helpful  Obstructive sleep apnea   she has obesity, narrow oropharyngeal, ESS score is 17, may consider sleep study refer Memory loss:   Mini-Mental Status Examination is 28 out of 30  MRI of brain   Laboratory evaluations     Marcial Pacas, M.D. Ph.D.  [facility] [address], [place], [place] [address] Ph: [phone] Fax: [phone]  CC: To Dr. Olin Hauser

## 2015-02-23 ENCOUNTER — Ambulatory Visit (INDEPENDENT_AMBULATORY_CARE_PROVIDER_SITE_OTHER): Payer: Self-pay

## 2015-02-23 DIAGNOSIS — G43709 Chronic migraine without aura, not intractable, without status migrainosus: Secondary | ICD-10-CM

## 2015-02-23 DIAGNOSIS — Z0289 Encounter for other administrative examinations: Secondary | ICD-10-CM

## 2015-02-23 DIAGNOSIS — R269 Unspecified abnormalities of gait and mobility: Secondary | ICD-10-CM

## 2015-02-23 DIAGNOSIS — R413 Other amnesia: Secondary | ICD-10-CM

## 2015-02-23 DIAGNOSIS — R251 Tremor, unspecified: Secondary | ICD-10-CM

## 2015-02-23 LAB — ANA W/REFLEX IF POSITIVE: ANA: NEGATIVE

## 2015-02-23 LAB — SEDIMENTATION RATE: Sed Rate: 13 mm/hr (ref 0–40)

## 2015-02-23 LAB — VITAMIN B12: Vitamin B-12: 415 pg/mL (ref 211–946)

## 2015-02-23 LAB — CK: Total CK: 49 U/L (ref 24–173)

## 2015-02-26 ENCOUNTER — Telehealth: Payer: Self-pay | Admitting: *Deleted

## 2015-02-26 ENCOUNTER — Telehealth: Payer: Self-pay | Admitting: Neurology

## 2015-02-26 NOTE — Telephone Encounter (Signed)
Spoke to patient - she is aware of results and will keep her follow up appt to further discuss.

## 2015-02-26 NOTE — Telephone Encounter (Signed)
[name],  Please call patient, MRI of the brain showed mild small vessel disease

## 2015-02-26 NOTE — Telephone Encounter (Signed)
-----   Message from Marcial Pacas, MD sent at 02/23/2015  2:35 PM EDT ----- Please call patient for normal laboratory result

## 2015-02-26 NOTE — Telephone Encounter (Signed)
Spoke to patient - aware of normal labs.

## 2015-03-14 ENCOUNTER — Other Ambulatory Visit: Payer: Self-pay | Admitting: Family Medicine

## 2015-03-14 DIAGNOSIS — R110 Nausea: Secondary | ICD-10-CM

## 2015-03-14 NOTE — Telephone Encounter (Signed)
Pt calling to make sure MD knows the pharmacy is [facility] on [address]

## 2015-03-29 ENCOUNTER — Other Ambulatory Visit: Payer: Self-pay | Admitting: Family Medicine

## 2015-03-29 ENCOUNTER — Ambulatory Visit (INDEPENDENT_AMBULATORY_CARE_PROVIDER_SITE_OTHER): Payer: 59 | Admitting: Neurology

## 2015-03-29 ENCOUNTER — Encounter: Payer: Self-pay | Admitting: Neurology

## 2015-03-29 VITALS — BP 123/74 | HR 67 | Ht 65.0 in | Wt 265.0 lb

## 2015-03-29 DIAGNOSIS — G4733 Obstructive sleep apnea (adult) (pediatric): Secondary | ICD-10-CM

## 2015-03-29 DIAGNOSIS — R251 Tremor, unspecified: Secondary | ICD-10-CM | POA: Diagnosis not present

## 2015-03-29 DIAGNOSIS — H8110 Benign paroxysmal vertigo, unspecified ear: Secondary | ICD-10-CM

## 2015-03-29 DIAGNOSIS — R413 Other amnesia: Secondary | ICD-10-CM | POA: Diagnosis not present

## 2015-03-29 DIAGNOSIS — R112 Nausea with vomiting, unspecified: Secondary | ICD-10-CM

## 2015-03-29 NOTE — Progress Notes (Signed)
Chief Complaint  Patient presents with  . Tremors    Her tremors have not improved with propranolol 60mg , TID.  She does feel like her headaches have been better since starting the medication.  . Dizziness    Says her dizziness is controlled only if she takes meclizine 25mg , TID.  . Memory Loss    MMSE - animals.  She is here with her daughter, [name].  They would like to discuss her MRI results.      PATIENT: [name] DOB: 11-Feb-1956  Chief Complaint  Patient presents with  . Tremors    Her tremors have not improved with propranolol 60mg , TID.  She does feel like her headaches have been better since starting the medication.  . Dizziness    Says her dizziness is controlled only if she takes meclizine 25mg , TID.  . Memory Loss    MMSE - animals.  She is here with her daughter, [name].  They would like to discuss her MRI results.     HISTORICAL  [name] is a 59 years old right-handed female, accompanied by her daughter-in-law, seen in refer by Dr. Olin Hauser for evaluation of tremor, dizziness, and memory loss  I have reviewed her most recent office evaluation in January 24 2015, she had a past medical history of hypertension, diabetes, hyperlipidemia, stroke, affecting the left side of her body, chronic pain, kidney stone  She had a history of migraine headaches in the past, but has not had her typical migraine for many years, since February 2016, she began to has frequent headaches, with associated dizziness, nausea, sometimes wake her up from sleep, with severe pounding headaches at bilateral region, lasting for hours, with associated dizziness, nausea, light noise sensitivity, she has been taking meclizine as needed, only limited help, light on sleeping resting has been helpful  She also complains of gradual onset memory trouble, got lost while driving, tends to repeat herself,  She has long-standing history of right hand tremor, getting worse since 2015, cause  social embarrassment, drop things from her right hand, spill her cup of water.  She denies loss sense of smell, no REM sleep disorder,  She does endorse loud snoring, excessive daytime sleepiness, today's PSA score is 17   I have personally reviewed CAT scan in April second 2016 that was normal MRI lumbar in March 2015, mild degenerative disc disease, no significant foraminal or canal stenosis. I reviewed laboratory evaluation in 2016, A1c was elevated 8.0, normal CBC, elevated triglycerides 301, normal TSH   UPDATE Sept 8th 2016: Propanolol works well for her, she has no headaches since she started beta blocker, but it did not help her tremor, she continue has intermittent right more than left hand tremor, no limitation on her function,  She also concerned about her frequent dizziness, lightheadedness, forgetful, Has been on polypharmacy treatment, this including Flexeril, gabapentin 800 mg 3 times a day, meclizine 25 mg 3 times daily  She has snoring, frequent, awakening at night time, excessive daytime sleepiness.  REVIEW OF SYSTEMS: Full 14 system review of systems performed and notable only for weight change, excessive sweating, ringing ears, trouble swallowing, wheezing, shortness of breath, choking, leg swelling, excessive eating, constipation, nausea, insomnia, frequent awakening, daytime sleepiness, snoring, sleep talking, difficulty urinating, back pain, muscle cramps, walking difficulty, memory loss, numbness, weakness, tremor, agitation, decreased concentration, anxiety  ALLERGIES: No Known Allergies  HOME MEDICATIONS: Current Outpatient Prescriptions  Medication Sig Dispense Refill  . alendronate (FOSAMAX) 70 MG  tablet TAKE 1 TABLET EVERY 7 DAYS 4 tablet 5  . aspirin EC 81 MG tablet Take 81 mg by mouth daily.    . Cholecalciferol 50000 UNITS capsule Take 1 capsule (50,000 Units total) by mouth daily. 8 capsule 0  . cyclobenzaprine (FLEXERIL) 10 MG tablet Take 1 tablet (10 mg  total) by mouth 3 (three) times daily as needed for muscle spasms. 180 tablet 5  . docusate sodium (COLACE) 100 MG capsule Take 100 mg by mouth daily.    . DULoxetine (CYMBALTA) 60 MG capsule Take 1 capsule (60 mg total) by mouth daily. 30 capsule 11  . gabapentin (NEURONTIN) 800 MG tablet Take 1 tablet (800 mg total) by mouth 3 (three) times daily. 270 tablet 5  . meclizine (ANTIVERT) 25 MG tablet Take 1 tablet (25 mg total) by mouth 3 (three) times daily as needed for dizziness. 90 tablet 2  . meloxicam (MOBIC) 15 MG tablet Take 1 tablet (15 mg total) by mouth daily. 30 tablet 11  . metFORMIN (GLUCOPHAGE) 500 MG tablet Take 1 tablet (500 mg total) by mouth 2 (two) times daily with a meal. 60 tablet 5  . omeprazole (PRILOSEC) 40 MG capsule Take 1 capsule (40 mg total) by mouth at bedtime. 30 capsule 11  . ondansetron (ZOFRAN) 4 MG tablet TAKE ONE TABLET BY MOUTH EVERY 8 HOURS AS NEEDED FOR NAUSEA AND VOMITING 30 tablet 0  . propranolol (INDERAL) 60 MG tablet Take 1 tablet (60 mg total) by mouth 3 (three) times daily. 60 tablet 6  . Psyllium (METAMUCIL PO) Take 1 scoop by mouth daily.    Marland Kitchen triamcinolone cream (KENALOG) 0.5 % Apply 1 application topically 3 (three) times daily. 30 g 2   No current facility-administered medications for this visit.    PAST MEDICAL HISTORY: Past Medical History  Diagnosis Date  . Kidney stone   . Stroke   . Memory loss   . Tremors of nervous system   . Vertigo   . Plantar fasciitis   . Arthritis     PAST SURGICAL HISTORY: Past Surgical History  Procedure Laterality Date  . Bladder surgery    . Cholecystectomy    . Appendectomy    . Total abdominal hysterectomy    . Foot surgery      FAMILY HISTORY: Family History  Problem Relation Age of Onset  . Healthy Mother   . Heart disease Father     SOCIAL HISTORY:  Social History   Social History  . Marital Status: Single    Spouse Name: N/A  . Number of Children: 4  . Years of Education: HS     Occupational History  . Unemployed    Social History Main Topics  . Smoking status: Never Smoker   . Smokeless tobacco: Not on file  . Alcohol Use: No  . Drug Use: No  . Sexual Activity: Not on file   Other Topics Concern  . Not on file   Social History Narrative   Lives at home alone.   Right-handed.   Rarely uses caffeine.     PHYSICAL EXAM   Filed Vitals:   03/29/15 1125  BP: 123/74  Pulse: 67  Height: 5\' 5"  (1.651 m)  Weight: 265 lb (120.203 kg)    Not recorded      Body mass index is 44.1 kg/(m^2).  PHYSICAL EXAMNIATION:  Gen: NAD, conversant, well nourised, obese, well groomed  Cardiovascular: Regular rate rhythm, no peripheral edema, warm, nontender. Eyes: Conjunctivae clear without exudates or hemorrhage Neck: Supple, no carotid bruise. Pulmonary: Clear to auscultation bilaterally   NEUROLOGICAL EXAM:  MENTAL STATUS: Speech:    Speech is normal; fluent and spontaneous with normal comprehension.  Cognition: MMSE 29/30, animal naming 10     She is not oriented to date other wise knows year, month, place.     Recent and remote memory, missed one out of 3 recalls     Normal Attention span and concentration     Normal Language, naming, repeating,spontaneous speech     Fund of knowledge   CRANIAL NERVES: CN II: Visual fields are full to confrontation. Fundoscopic exam is normal with sharp discs and no vascular changes. Pupils are round equal and briskly reactive to light. CN III, IV, VI: extraocular movement are normal. No ptosis. CN V: Facial sensation is intact to pinprick in all 3 divisions bilaterally. Corneal responses are intact.  CN VII: Face is symmetric with normal eye closure and smile. CN VIII: Hearing is normal to rubbing fingers CN IX, X: Palate elevates symmetrically. Phonation is normal. CN XI: Head turning and shoulder shrug are intact CN XII: Tongue is midline with normal movements and no atrophy.narrow oral  pharyngeal space.  MOTOR: She has right more than left posturing tremor, no rigidity, no muscle weakness,   REFLEXES: Reflexes are 2+ and symmetric at the biceps, triceps, knees, and ankles. Plantar responses are flexor.  SENSORY: Mildly dense dependent decreased  light touch, pinprick  COORDINATION: Rapid alternating movements and fine finger movements are intact. There is no dysmetria on finger-to-nose and heel-knee-shin.    GAIT/STANCE: Posture is normal. Gait is steady with normal steps, base, arm swing, and turning. Heel and toe walking are normal. .  Romberg is absent.  DIAGNOSTIC DATA (LABS, IMAGING, TESTING) - I reviewed patient records, labs, notes, testing and imaging myself where available.  ASSESSMENT AND PLAN  KARRISSA PARCHMENT is a 59 y.o. female    headaches with associated dizziness, nausea,   most consistent with migraine headaches,  Propanolol has been helpful, 60 mg 3 times a day, no side effect noticed  Tremor, right worse than left,  Exaggerated physiological tremor, keep her beta blocker Obstructive sleep apnea   she has obesity, narrow oropharyngeal, ESS score was17, I will refer her to sleep study, she perform home sleep study Memory loss:   Mini-Mental Status Examination is 29 out of 30  Medicine side effect could play a role, I have suggested her decrease gabapentin, Flexeril, stop meclizine  Moderate exercise     Marcial Pacas, M.D. Ph.D.  [facility] [address] [place], [place] [address] Ph: [phone] Fax: [phone]  CC: To Dr. Olin Hauser

## 2015-04-12 ENCOUNTER — Encounter: Payer: Self-pay | Admitting: Physical Medicine & Rehabilitation

## 2015-04-19 ENCOUNTER — Other Ambulatory Visit: Payer: Self-pay | Admitting: Family Medicine

## 2015-04-23 ENCOUNTER — Other Ambulatory Visit: Payer: Self-pay | Admitting: *Deleted

## 2015-04-23 DIAGNOSIS — H8110 Benign paroxysmal vertigo, unspecified ear: Secondary | ICD-10-CM

## 2015-04-23 DIAGNOSIS — R112 Nausea with vomiting, unspecified: Secondary | ICD-10-CM

## 2015-04-23 MED ORDER — ONDANSETRON HCL 4 MG PO TABS: ORAL_TABLET | ORAL | Status: DC

## 2015-04-26 ENCOUNTER — Encounter: Payer: Self-pay | Admitting: Family Medicine

## 2015-04-26 ENCOUNTER — Ambulatory Visit (INDEPENDENT_AMBULATORY_CARE_PROVIDER_SITE_OTHER): Payer: 59 | Admitting: Family Medicine

## 2015-04-26 VITALS — BP 129/67 | HR 72 | Temp 98.2°F | Ht 65.0 in | Wt 265.4 lb

## 2015-04-26 DIAGNOSIS — E1169 Type 2 diabetes mellitus with other specified complication: Secondary | ICD-10-CM | POA: Insufficient documentation

## 2015-04-26 DIAGNOSIS — Z23 Encounter for immunization: Secondary | ICD-10-CM | POA: Diagnosis not present

## 2015-04-26 DIAGNOSIS — E785 Hyperlipidemia, unspecified: Secondary | ICD-10-CM

## 2015-04-26 DIAGNOSIS — G894 Chronic pain syndrome: Secondary | ICD-10-CM

## 2015-04-26 DIAGNOSIS — E669 Obesity, unspecified: Secondary | ICD-10-CM | POA: Diagnosis not present

## 2015-04-26 DIAGNOSIS — K219 Gastro-esophageal reflux disease without esophagitis: Secondary | ICD-10-CM | POA: Diagnosis not present

## 2015-04-26 DIAGNOSIS — R251 Tremor, unspecified: Secondary | ICD-10-CM

## 2015-04-26 DIAGNOSIS — Z1159 Encounter for screening for other viral diseases: Secondary | ICD-10-CM

## 2015-04-26 DIAGNOSIS — E119 Type 2 diabetes mellitus without complications: Secondary | ICD-10-CM

## 2015-04-26 LAB — POCT GLYCOSYLATED HEMOGLOBIN (HGB A1C): HEMOGLOBIN A1C: 8.2

## 2015-04-26 LAB — LIPID PANEL
Cholesterol: 144 mg/dL (ref 125–200)
HDL: 21 mg/dL — AB (ref >=46)
LDL CALC: 61 mg/dL (ref <130)
Total CHOL/HDL Ratio: 6.9 Ratio — ABNORMAL HIGH (ref <=5.0)
Triglycerides: 311 mg/dL — ABNORMAL HIGH (ref <150)
VLDL: 62 mg/dL — ABNORMAL HIGH (ref <30)

## 2015-04-26 MED ORDER — DULOXETINE HCL 60 MG PO CPEP: 60.0000 mg | ORAL_CAPSULE | Freq: Every day | ORAL | Status: DC

## 2015-04-26 MED ORDER — ATORVASTATIN CALCIUM 10 MG PO TABS: 10.0000 mg | ORAL_TABLET | Freq: Every day | ORAL | Status: DC

## 2015-04-26 MED ORDER — METFORMIN HCL 1000 MG PO TABS: 1000.0000 mg | ORAL_TABLET | Freq: Two times a day (BID) | ORAL | Status: DC

## 2015-04-26 MED ORDER — OMEPRAZOLE 20 MG PO CPDR: 20.0000 mg | DELAYED_RELEASE_CAPSULE | Freq: Every day | ORAL | Status: DC

## 2015-04-26 NOTE — Progress Notes (Signed)
Subjective:    [name], female    DOB: February 23, 1956, 59 y.o.   MRN: [id number]  [name] is a 59 y.o. female presenting on 04/26/2015 for Follow-up  Patient presents with daughter [name]) today.  HPI  CHRONIC DM, Type 2: Reports no concerns, has "good days and bad days", newly diagnosed T2DM 04/2014 but did not follow-up until within past 3 months CBGs: Avg 150-170, Low 118, High 322. Checks CBGs 3x daily (fasting in AM, before or 2 hr after lunch, bedtime) brought detailed log in  Meds: Metformin 500mg  BID. Never been on insulin Reports good compliance. Tolerating well w/o side-effects Currently not on ACEi/ARB, screening initially with microalbumin Lifestyle: Diet (trying to improve DM diet, reduced portions and carbs) / Exercise (limited due to chronic pain) - Taking ASA 81 daily, not on statin therapy - No annual ophtho exam yet, referral placed Denies hypoglycemia, polyuria, visual changes, numbness or tingling.  HLD / OBESITY: - Today wt 265 lb, up +10 lbs in 3 months, overall continued wt gain trend of about +50 lbs in 2 years - Last lipid panel 04/2014 with elevated TGs, 301, low HDL 32, and appropriate low LDL 55 - Not on statin therapy, never tried before  TREMORS / DIZZINESS / MIGRAINE HA: - Known chronic problem for years with R > L tremor worsening since 2015, last seen by me 01/2015, referred to Neurology for tremor and also HAs, dizziness. Now followed by Dr. Krista Blue ([place]) diagnosed with migraine headaches causing dizziness and tremor thought to be benign exaggerated physiological tremor. Started on Propanolol 60mg  TID, last follow-up 03/29/15. - additionally h/o known CVA > 10 years ago. Does not consume caffeine or alcohol, no family h/o similar tremor. Next f/u 1-2 months - last imaging MRI brain w/o contrast 02/22/15, abnormal results with scatterd tiny periventricular, subcortical and central brainstem non-specific white matter hyperintensities - Today  reports that tremor seems to be unchanged but not worsening. Headaches are resolved on propanolol. Had been taking Meclizine for dizziness, but has stopped this. Also advised to reduce Gabapentin, advised by neurology tremors may be medication induced. Now taking Gabapentin at night with flexeril, but still taking AM / afternoon doses most days. - Admits some lower ankle edema since on propanolol  CHRONIC PAIN SYNDROME: - Chronic LBP worsening >3 years, h/o Lumbar MRI (March 2015) with significant L4-L5 DJD, prior compression fracture. Currently on long-term disability >5 years, permanent handicap placard - Today no new complaints of LBP, currently stable. Limited ability to ambulate for daily activities, requires assistance if walking >200 ft, uses cane, walker as needed - Currently on Mobic, Gabapentin, Cymbalta, Flexeril - Awaiting referral to chronic pain management with PM&R Dr. Letta Pate upcoming end of October 2016 - Denies any numbness, weakness, radiating down legs, urinary incontinence or retention  HM: - Again reminded patient due for mammogram and colonoscopy, has resources, will consider scheduling - UTD on TDap and PNA vaccine - Due for flu shot today, will receive - Agrees to Hep C screening   Past Medical History  Diagnosis Date  . Kidney stone   . Stroke ([place])   . Memory loss   . Tremors of nervous system   . Vertigo   . Plantar fasciitis   . Arthritis     Social History   Social History  . Marital Status: Single    Spouse Name: N/A  . Number of Children: 4  . Years of Education: HS   Occupational  History  . Unemployed    Social History Main Topics  . Smoking status: Never Smoker   . Smokeless tobacco: Not on file  . Alcohol Use: No  . Drug Use: No  . Sexual Activity: Not on file   Other Topics Concern  . Not on file   Social History Narrative   Lives at home alone.   Right-handed.   Rarely uses caffeine.    Current Outpatient Prescriptions on  File Prior to Visit  Medication Sig  . alendronate (FOSAMAX) 70 MG tablet TAKE 1 TABLET EVERY 7 DAYS  . aspirin EC 81 MG tablet Take 81 mg by mouth daily.  . Cholecalciferol 50000 UNITS capsule Take 1 capsule (50,000 Units total) by mouth daily.  . cyclobenzaprine (FLEXERIL) 10 MG tablet Take 1 tablet (10 mg total) by mouth 3 (three) times daily as needed for muscle spasms.  Marland Kitchen docusate sodium (COLACE) 100 MG capsule Take 100 mg by mouth daily.  Marland Kitchen gabapentin (NEURONTIN) 800 MG tablet Take 1 tablet (800 mg total) by mouth 3 (three) times daily.  . meclizine (ANTIVERT) 25 MG tablet Take 1 tablet (25 mg total) by mouth 3 (three) times daily as needed for dizziness.  . meloxicam (MOBIC) 15 MG tablet Take 1 tablet (15 mg total) by mouth daily.  . ondansetron (ZOFRAN) 4 MG tablet TAKE ONE TABLET BY MOUTH EVERY 8 HOURS AS NEEDED FOR NAUSEA AND VOMITING  . propranolol (INDERAL) 60 MG tablet Take 1 tablet (60 mg total) by mouth 3 (three) times daily.  . Psyllium (METAMUCIL PO) Take 1 scoop by mouth daily.  Marland Kitchen triamcinolone cream (KENALOG) 0.5 % Apply 1 application topically 3 (three) times daily.   No current facility-administered medications on file prior to visit.    Review of Systems  Constitutional: Negative for fever, chills, diaphoresis, activity change, appetite change and fatigue.  HENT: Negative for congestion and hearing loss.   Eyes: Negative for visual disturbance.  Respiratory: Negative for cough, chest tightness, shortness of breath and wheezing.   Cardiovascular: Negative for chest pain, palpitations and leg swelling.  Gastrointestinal: Negative for nausea, vomiting, abdominal pain, diarrhea and constipation.  Genitourinary: Negative for dysuria, frequency and hematuria.  Musculoskeletal: Positive for back pain, joint swelling (bilateral lower ankles, mild), arthralgias and gait problem. Negative for neck pain.  Skin: Negative for rash.  Neurological: Positive for tremors (R arm >  left). Negative for dizziness, syncope, weakness, light-headedness, numbness and headaches (resolved).  Hematological: Negative for adenopathy.  Psychiatric/Behavioral: Negative for suicidal ideas, behavioral problems, confusion, sleep disturbance, self-injury, dysphoric mood and decreased concentration. The patient is not nervous/anxious.    Per HPI unless specifically indicated above     Objective:    BP 129/67 mmHg  Pulse 72  Temp(Src) 98.2 F (36.8 C) (Oral)  Ht 5\' 5"  (1.651 m)  Wt 265 lb 7 oz (120.402 kg)  BMI 44.17 kg/m2  Wt Readings from Last 3 Encounters:  04/26/15 265 lb 7 oz (120.402 kg)  03/29/15 265 lb (120.203 kg)  02/23/15 255 lb (115.667 kg)    Physical Exam  Constitutional: She is oriented to person, place, and time. She appears well-developed and well-nourished. No distress.  Chronically ill but currently well appearing  HENT:  Head: Normocephalic and atraumatic.  Mouth/Throat: Oropharynx is clear and moist.  Eyes: Conjunctivae and EOM are normal. Pupils are equal, round, and reactive to light.  Neck: Normal range of motion. Neck supple. No thyromegaly present.  Cardiovascular: Normal rate, regular rhythm, normal heart  sounds and intact distal pulses.   No murmur heard. Pulmonary/Chest: Effort normal and breath sounds normal. No respiratory distress. She has no wheezes. She has no rales.  Musculoskeletal: Normal range of motion. She exhibits edema (mild non pitting edema around b/l ankles). She exhibits no tenderness.  Bilateral lower lumbar mild +TTP with some mild paraspinal hypertonicity, reduced ROM  Lymphadenopathy:    She has no cervical adenopathy.  Neurological: She is alert and oriented to person, place, and time. She has normal reflexes. No cranial nerve deficit. Coordination normal.  Exhibits only Right hand / forearm tremors with activity and focusing on hands, while distracted or during history taking no evidence of tremor. Left arm without sign of  tremor today.  Skin: Skin is warm and dry. No rash noted. She is not diaphoretic.  Psychiatric: She has a normal mood and affect. Her behavior is normal. Judgment and thought content normal.  Nursing note and vitals reviewed.  Results for orders placed or performed in visit on 04/26/15  Lipid panel  Result Value Ref Range   Cholesterol 144 125 - 200 mg/dL   Triglycerides 311 (H) <150 mg/dL   HDL 21 (L) >=46 mg/dL   Total CHOL/HDL Ratio 6.9 (H) <=5.0 Ratio   VLDL 62 (H) <30 mg/dL   LDL Cholesterol 61 <130 mg/dL  Microalbumin, urine  Result Value Ref Range   Microalb, Ur 0.4 <2.0 mg/dL  Hepatitis C antibody  Result Value Ref Range   HCV Ab NEGATIVE NEGATIVE  POCT glycosylated hemoglobin (Hb A1C)  Result Value Ref Range   Hemoglobin A1C 8.2       Assessment & Plan:   Problem List Items Addressed This Visit      Digestive   GASTROESOPHAGEAL REFLUX, NO ESOPHAGITIS    Controlled but now with some breakthrough non-specific complaints of upset stomach  Plan: 1. Reduce dose Omeprazole 40mg  daily to 20mg  daily      Relevant Medications   omeprazole (PRILOSEC) 20 MG capsule     Endocrine   Diabetes mellitus type 2, uncomplicated (HCC) - Primary    Stable moderately controlled DM2, after recent new diagnosis and start therapy in 01/2015. Seems adherent to treatment, tolerating metformin, and keeps detailed CBG logs Current HgbA1c 8.2 (04/26/15) last A1c 8.0 (03/23/89) No complications or hypoglycemia. - DM foot exam (10/6, normal), UTD PNA vax  Plan:  1. Increase Metformin to 1000mg  BID (tolerated 500 BID well) 2. Encouraged improve lifestyle Mods - counseling on DM diet (dec carbs, inc vegs), concern with limited exercise due to chronic pain / limited ambulation. 3. Received Flu shot today 4. Referral to Ophtho for annual DM eye exam 5. Cont ASA 81 daily 6. Check fasting lipid panel - *results 10/7 with continued elevated TGs 311, lower HDL 21, LDL 61. Will start statin therapy  today with atorv 10mg  daily 7. Check urine microalbumin - *results 10/7 with negative 0.4. Not on ACEi/ARB yet 8. RTC 3 months, A1c, anticipate start low dose ACEi, consider nutrition referral      Relevant Medications   metFORMIN (GLUCOPHAGE) 1000 MG tablet   atorvastatin (LIPITOR) 10 MG tablet   Other Relevant Orders   POCT glycosylated hemoglobin (Hb A1C) (Completed)   Microalbumin, urine (Completed)   Ambulatory referral to Ophthalmology   Dyslipidemia associated with type 2 diabetes mellitus ([place])    Previously with dyslipidemia high TG, low HDL, but low normal LDL, continues to gain wt, and now dx DM2 concerning for inc risk ASCVD 10 yr  calc to 8.2%  Plan: 1. Check fasting lipid panel - *results 10/7 with continued elevated TGs 311, lower HDL 21, LDL 61. Will start statin therapy today with atorv 10mg  daily, counseled on side-effects, myalgias      Relevant Medications   metFORMIN (GLUCOPHAGE) 1000 MG tablet   atorvastatin (LIPITOR) 10 MG tablet   Other Relevant Orders   Lipid panel (Completed)     Other   Chronic pain syndrome    Stable, chronic LBP without radicular symptoms or complication. - On long-term disability, permanent handicap placard  Plan: 1. Establish with upcoming PMR Pain Mgnt - Dr. Letta Pate 05/15/15 2. Continue current med regimen with Mobic, Neurontin, Cymbalta, Flexeril       Relevant Medications   DULoxetine (CYMBALTA) 60 MG capsule   OBESITY, NOS    Wt gain +10 lbs in 3 months, overall up +50 lbs in 2 years, limited exercise - Counseled on healthy lifestyle changes, important to improve diet with limited mobility      Relevant Medications   metFORMIN (GLUCOPHAGE) 1000 MG tablet   Tremor of both hands    Stable, chronic R > L hand/forearm. Clinically today with reduced tremor, only seems to be present or worse with movement or intention, focusing on arm, otherwise distractible. - Followed by Neurology Dr. Krista Blue ([place]) - last visit 03/2015, on  Propanolol 60mg  TID, thought exaggerated physiologic tremor  Plan: 1. Continue Propanolol, as controlling Migraine HAs 2. Follow-up with Neuro as scheduled       Other Visit Diagnoses    Need for hepatitis C screening test        Relevant Orders    Hepatitis C antibody (Completed)    Encounter for immunization           Meds ordered this encounter  Medications  . metFORMIN (GLUCOPHAGE) 1000 MG tablet    Sig: Take 1 tablet (1,000 mg total) by mouth 2 (two) times daily with a meal.    Dispense:  180 tablet    Refill:  3  . omeprazole (PRILOSEC) 20 MG capsule    Sig: Take 1 capsule (20 mg total) by mouth daily.    Dispense:  30 capsule    Refill:  5  . DULoxetine (CYMBALTA) 60 MG capsule    Sig: Take 1 capsule (60 mg total) by mouth daily.    Dispense:  30 capsule    Refill:  11  . atorvastatin (LIPITOR) 10 MG tablet    Sig: Take 1 tablet (10 mg total) by mouth daily.    Dispense:  30 tablet    Refill:  5      Follow up plan: Return in about 3 months (around 07/27/2015) for diabetes.  A total of 25 minutes was spent face-to-face with this patient. Over half this time was spent on counseling patient on the diagnosis and different diagnostic and therapeutic options available.  [name], [place], PGY-3

## 2015-04-26 NOTE — Patient Instructions (Signed)
Dear [name], Thank you for coming in to clinic today.  1. Vertigo / Tremors - keep following with Neurology, may try to reduce Gabapentin as recommended, twice daily or cut morning and day time pills in half, keep full dose at night  3. For Diabetes - you are doing well, blood sugars stable, thank you for bringing in sugar log - Increase Metformin to 1000mg  tabs twice daily (with food, may cause upset stomach) - you can finish your other pills, take 2 of the 500s twice daily  Start cholesterol medicine today, atorvastatin 10mg  (generic lipitor), take one daily, if you get any significant muscle aches or cramps, this may improve over few weeks but if persistent please let me know  Due for Mammogram and Colonoscopy screening - review these handouts and please schedule these appointments. Contact us if you have questions.  Flu shot today  Labs - cholesterol,. Hep c screening, urine test for protein  Please schedule a follow-up appointment with me (Dr. Parks Ranger) in 3 months for Diabetes Follow-up.  Eye doctor appointment should be scheduled soon  If you have any other questions or concerns, please feel free to call the clinic to contact me. You may also schedule an earlier appointment if necessary.  However, if your symptoms get significantly worse, please go to the Emergency Department to seek immediate medical attention.  [name], [place]

## 2015-04-27 LAB — MICROALBUMIN, URINE: Microalb, Ur: 0.4 mg/dL (ref <2.0)

## 2015-04-27 LAB — HEPATITIS C ANTIBODY: HCV AB: NEGATIVE

## 2015-04-27 NOTE — Assessment & Plan Note (Signed)
Stable, chronic R > L hand/forearm. Clinically today with reduced tremor, only seems to be present or worse with movement or intention, focusing on arm, otherwise distractible. - Followed by Neurology Dr. Krista Blue ([place]) - last visit 03/2015, on Propanolol 60mg  TID, thought exaggerated physiologic tremor  Plan: 1. Continue Propanolol, as controlling Migraine HAs 2. Follow-up with Neuro as scheduled

## 2015-04-27 NOTE — Assessment & Plan Note (Signed)
Stable, chronic LBP without radicular symptoms or complication. - On long-term disability, permanent handicap placard  Plan: 1. Establish with upcoming [facility] Pain Mgnt - Dr. Letta Pate 05/15/15 2. Continue current med regimen with Mobic, Neurontin, Cymbalta, Flexeril

## 2015-04-27 NOTE — Assessment & Plan Note (Addendum)
Stable moderately controlled DM2, after recent new diagnosis and start therapy in 01/2015. Seems adherent to treatment, tolerating metformin, and keeps detailed CBG logs Current HgbA1c 8.2 (04/26/15) last A1c 8.0 (08/24/80) No complications or hypoglycemia. - DM foot exam (10/6, normal), UTD PNA vax  Plan:  1. Increase Metformin to 1000mg  BID (tolerated 500 BID well) 2. Encouraged improve lifestyle Mods - counseling on DM diet (dec carbs, inc vegs), concern with limited exercise due to chronic pain / limited ambulation. 3. Received Flu shot today 4. Referral to Ophtho for annual DM eye exam 5. Cont ASA 81 daily 6. Check fasting lipid panel - *results 10/7 with continued elevated TGs 311, lower HDL 21, LDL 61. Will start statin therapy today with atorv 10mg  daily 7. Check urine microalbumin - *results 10/7 with negative 0.4. Not on ACEi/ARB yet 8. RTC 3 months, A1c, anticipate start low dose ACEi, consider nutrition referral

## 2015-04-27 NOTE — Assessment & Plan Note (Signed)
Wt gain +10 lbs in 3 months, overall up +50 lbs in 2 years, limited exercise - Counseled on healthy lifestyle changes, important to improve diet with limited mobility

## 2015-04-27 NOTE — Assessment & Plan Note (Signed)
Previously with dyslipidemia high TG, low HDL, but low normal LDL, continues to gain wt, and now dx DM2 concerning for inc risk ASCVD 10 yr calc to 8.2%  Plan: 1. Check fasting lipid panel - *results 10/7 with continued elevated TGs 311, lower HDL 21, LDL 61. Will start statin therapy today with atorv 10mg  daily, counseled on side-effects, myalgias

## 2015-04-27 NOTE — Assessment & Plan Note (Signed)
Controlled but now with some breakthrough non-specific complaints of upset stomach  Plan: 1. Reduce dose Omeprazole 40mg  daily to 20mg  daily

## 2015-04-30 ENCOUNTER — Telehealth: Payer: Self-pay | Admitting: Family Medicine

## 2015-04-30 NOTE — Telephone Encounter (Signed)
Last OV 04/26/15 for DM2, HLD. At that visit checked A1c at 8.2 (from 8.0) relatively stable, Urine Microalbumin negative 0.4 (has not started ACE/ARB yet will likely start next visit), increased Metformin to 1000mg  BID. Hep C screening negative. Also checked fasting lipid panel with elevated TG 311 (stable from 2015), low HDL 21 (worse from 2015, at 32), and appropriately low LDL at 61 (stable since 2015). She was not on statin therapy previously. She is taking daily ASA. I started her on atorvastatin 10mg  daily on 04/26/15. No change to this plan based on these results, her ASCVD 10 yr risk score is 8.2%, so she remains on moderate intensity statin therapy, and LDL is at goal, will be difficult to raise HDL and can consider alternative medicines in future.  Attempted to call patient with these lab results, unable to reach. Did leave voice mail stating all labs stable with only concerns on the lipid panel, but recommend to keep taking the atorvastatin as prescribed, will follow-up in 3 months.  [name], [place], PGY-3

## 2015-05-15 ENCOUNTER — Ambulatory Visit (HOSPITAL_BASED_OUTPATIENT_CLINIC_OR_DEPARTMENT_OTHER): Payer: 59 | Admitting: Physical Medicine & Rehabilitation

## 2015-05-15 ENCOUNTER — Other Ambulatory Visit: Payer: Self-pay | Admitting: Physical Medicine & Rehabilitation

## 2015-05-15 ENCOUNTER — Encounter: Payer: Self-pay | Admitting: Physical Medicine & Rehabilitation

## 2015-05-15 ENCOUNTER — Encounter: Payer: 59 | Attending: Physical Medicine & Rehabilitation

## 2015-05-15 VITALS — BP 105/58 | HR 70 | Resp 15

## 2015-05-15 DIAGNOSIS — M47816 Spondylosis without myelopathy or radiculopathy, lumbar region: Secondary | ICD-10-CM | POA: Insufficient documentation

## 2015-05-15 DIAGNOSIS — G894 Chronic pain syndrome: Secondary | ICD-10-CM

## 2015-05-15 DIAGNOSIS — G252 Other specified forms of tremor: Secondary | ICD-10-CM | POA: Insufficient documentation

## 2015-05-15 DIAGNOSIS — I639 Cerebral infarction, unspecified: Secondary | ICD-10-CM | POA: Diagnosis not present

## 2015-05-15 DIAGNOSIS — M533 Sacrococcygeal disorders, not elsewhere classified: Secondary | ICD-10-CM

## 2015-05-15 DIAGNOSIS — R413 Other amnesia: Secondary | ICD-10-CM | POA: Diagnosis not present

## 2015-05-15 DIAGNOSIS — Z5181 Encounter for therapeutic drug level monitoring: Secondary | ICD-10-CM | POA: Insufficient documentation

## 2015-05-15 DIAGNOSIS — R42 Dizziness and giddiness: Secondary | ICD-10-CM | POA: Insufficient documentation

## 2015-05-15 NOTE — Patient Instructions (Signed)
We discussed the treatment plan which includes  Injection of the sacroiliac joint and perhaps other joints Medication management Weight loss Physical therapy

## 2015-05-15 NOTE — Progress Notes (Signed)
Subjective:    [name], female    DOB: 10-09-1955, 59 y.o.   MRN: [id number]  HPI CC:  Left lower back pain 59 year old female with history of primarily left-sided low back pain since 2006. Patient states that her pain has worsened over time. No history of lumbar surgery. There is no radiating pain. Patient has history of bladder problems has undergone bladder sling and has had some incontinence related to this. Patient has no problems with bowel continence.  Patient states that her pain has interfered with her driving most recently. She is no longer working she's been on disability for 2 years  For multiple conditions including her low back as well as her chronic bladder problems.  Patient states she underwent physical therapy about 5 years ago. She did some type of exercises on the table lifting her legs as well as some from a standing position. She states that the therapy seemed to aggravate her pain.  Patient has not had any back injections in the past.  Patient has been on Flexeril 10 mg 3 times a day which has helped with leg spasms and cramps. She has been on Neurontin 800 mg 3 times a day following one of her strokes which cause some left-sided pain. She also has been started on Cymbalta 60 mg a day this is mainly for depression.  Patient feels this is helping with anxiety and depression but has not helped the pain. Patient is also on meloxicam. Patient states it does not help much with her pain. Patient has tried tramadol in the past states it was not helpful.  Past medical history significant for cerebrovascular accident 2, has some residual left-sided weakness. Recent MRI of the brain did not show any new strokes. Pain Inventory Average Pain 10 Pain Right Now 9 My pain is sharp and stabbing  In the last 24 hours, has pain interfered with the following? General activity 10 Relation with others 10 Enjoyment of life 10 What TIME of day is your pain at its  worst? Morning, Daytime, Evening and Night Sleep (in general) Poor  Pain is worse with: walking, bending, sitting, inactivity and standing Pain improves with: NA Relief from Meds: 0  Mobility walk without assistance walk with assistance how many minutes can you walk? 5 ability to climb steps?  no do you drive?  yes needs help with transfers  Function Do you have any goals in this area?  no  Neuro/Psych weakness numbness tremor tingling trouble walking spasms dizziness confusion depression  Prior Studies Any changes since last visit?  no CLINICAL DATA: Left low back pain.  EXAM: MRI LUMBAR SPINE WITHOUT CONTRAST  TECHNIQUE: Multiplanar, multisequence MR imaging was performed. No intravenous contrast was administered.  COMPARISON: DG LUMBAR SPINE COMPLETE dated 09/30/2012; CT ABD/PELVIS W CM dated 08/26/2012; LS SPINE - COMPLETE dated 11/09/2006  FINDINGS: The lowest lumbar type non-rib-bearing vertebra is labeled as L5. The conus medullaris appears normal. Conus level: L1.  35% compression fracture of the superior endplate of L5 noted without current vertebral body edema. Subtle marrow edema in the right-sided L4-5 facets attributed to degenerative facet arthropathy. There is 4 mm of grade 1 anterolisthesis at L4-5 probably secondary to the degenerative facet arthropathy. Mild disc desiccation is noted throughout the lumbar spine. There are no MR indicators that the L5 compression fracture is due to an underlying lesion or malignancy.  There is 3 mm degenerative anterolisthesis at L3-4.  Small central disc protrusion at T11 noted  without impingement. Additional findings at individual levels are as follows:  L1-2: No impingement. Minimal disc bulge.  L2-3: No impingement. Mild disc bulge and mild facet arthropathy.  L3-4: No impingement. Moderate facet arthropathy and mild disc bulge.  L4-5: Mild bilateral subarticular lateral recess  stenosis and borderline central narrowing of the thecal sac due to disc bulge and facet arthropathy along with disc uncovering.  L5-S1: No impingement. Bilateral facet arthropathy.  IMPRESSION: 1. Lumbar spondylosis and degenerative disc disease, causing mild impingement at L4-5. 2. 35% chronic superior endplate compression fracture at L5. No vertebral edema. 3. Mild grade 1 degenerative anterolisthesis at L3-4 and L4-5.   Electronically Signed  By: Sherryl Barters M.D.  On: 09/24/2013 11:12 Physicians involved in your care Any changes since last visit?  no   Family History  Problem Relation Age of Onset  . Healthy Mother   . Heart disease Father   . Alcohol abuse Father   . Alcohol abuse Brother    Social History   Social History  . Marital Status: Single    Spouse Name: N/A  . Number of Children: 4  . Years of Education: HS   Occupational History  . Unemployed    Social History Main Topics  . Smoking status: Never Smoker   . Smokeless tobacco: None  . Alcohol Use: No  . Drug Use: No  . Sexual Activity: Not Asked   Other Topics Concern  . None   Social History Narrative   Lives at home alone.   Right-handed.   Rarely uses caffeine.   Past Surgical History  Procedure Laterality Date  . Bladder surgery    . Cholecystectomy    . Appendectomy    . Total abdominal hysterectomy    . Foot surgery     Past Medical History  Diagnosis Date  . Kidney stone   . Stroke ([place])   . Memory loss   . Tremors of nervous system   . Vertigo   . Plantar fasciitis   . Arthritis    BP 105/58 mmHg  Pulse 70  Resp 15  SpO2 98%  Opioid Risk Score:   Fall Risk Score:  `1  Depression screen PHQ 2/9  Depression screen [facility] 2/9 05/15/2015 04/26/2015 01/24/2015 04/25/2014 07/11/2013 06/20/2013  Decreased Interest 2 0 0 0 0 1  Down, Depressed, Hopeless 2 0 0 0 0 1  PHQ - 2 Score 4 0 0 0 0 2  Altered sleeping 3 - - - - -  Tired, decreased energy 3 - - - - -    Change in appetite 0 - - - - -  Feeling bad or failure about yourself  1 - - - - -  Trouble concentrating 3 - - - - -  Moving slowly or fidgety/restless 1 - - - - -  Suicidal thoughts 3 - - - - -  PHQ-9 Score 18 - - - - -  Difficult doing work/chores Very difficult - - - - -      Review of Systems  Constitutional: Positive for unexpected weight change.  Respiratory: Positive for wheezing.   Gastrointestinal: Positive for nausea and constipation.  Neurological: Positive for dizziness, tremors, weakness and numbness.       Tingling Gait instability  Psychiatric/Behavioral: Positive for confusion.       Depression  All other systems reviewed and are negative.      Objective:   Physical Exam  Constitutional: She is oriented to person, place, and time.  She appears well-developed and well-nourished.  HENT:  Head: Normocephalic and atraumatic.  Right Ear: External ear normal.  Left Ear: External ear normal.  Mouth/Throat: Oropharynx is clear and moist.  Eyes: Conjunctivae are normal. Pupils are equal, round, and reactive to light.  Neck: Normal range of motion.  Cardiovascular: Normal rate.   Pulmonary/Chest: Effort normal and breath sounds normal. No respiratory distress.  Abdominal: Soft. Bowel sounds are normal. She exhibits no distension. There is no tenderness.  Neurological: She is alert and oriented to person, place, and time.  Psychiatric: She has a normal mood and affect.  Nursing note and vitals reviewed.  Neuro:  Eyes without evidence of nystagmus  Tone is normal without evidence of spasticity Cerebellar exam shows no evidence of ataxia on finger nose finger or heel to shin testing No evidence of trunkal ataxia  Motor strength is 5/5 in bilateral deltoid, biceps, triceps, finger flexors and extensors, wrist flexors and extensors, hip flexors, knee flexors and extensors, ankle dorsiflexors, plantar flexors, invertors and evertors, toe flexors and  extensors  Sensory exam is normal to pinprick, proprioception and light touch in the upper and lower limbs   Lumbar spine has pain with extension greater than with flexion. She also has reduced range of motion with 25-50% forward flexion 0-25% extension 25-50% lateral bending and rotation.  Left PSIS tenderness to palpation Pain at the left PSIS area with Faber's test left greater than right       Assessment & Plan:  1.Chronic low back pain, axial, worse on the left side. Exam is most consistent with sacroiliac pain. Imaging studies were reviewed personally and in addition to the radiology report my Impression includes  Lumbar spondylosis, severe at L45 and L5-S1 levels, Moderate at L3-L4 levels.  We discussed the treatment plan which includes  Injection of the sacroiliac joint On the left side Medication management, Schedule 3 pain medications most appropriate in this situation Weight loss, This was discussed with the patient Physical therapy, Once pain is under better control and pain generator is determined  If UDS is okay would start with Tylenol No. 4 one tablet 3 times a day, #90 no refills, may be called in

## 2015-05-16 LAB — PRESCRIPTION MONITORING PROFILE (SOLSTAS)
AMPHETAMINE/METH: NEGATIVE ng/mL
BARBITURATE SCREEN, URINE: NEGATIVE ng/mL
BUPRENORPHINE, URINE: NEGATIVE ng/mL
Benzodiazepine Screen, Urine: NEGATIVE ng/mL
CARISOPRODOL, URINE: NEGATIVE ng/mL
CREATININE, URINE: 57.58 mg/dL (ref >20.0)
Cannabinoid Scrn, Ur: NEGATIVE ng/mL
Cocaine Metabolites: NEGATIVE ng/mL
Fentanyl, Ur: NEGATIVE ng/mL
MDMA URINE: NEGATIVE ng/mL
Meperidine, Ur: NEGATIVE ng/mL
Methadone Screen, Urine: NEGATIVE ng/mL
NITRITES URINE, INITIAL: NEGATIVE ug/mL
OPIATE SCREEN, URINE: NEGATIVE ng/mL
OXYCODONE SCRN UR: NEGATIVE ng/mL
PH URINE, INITIAL: 4.8 pH (ref 4.5–8.9)
PROPOXYPHENE: NEGATIVE ng/mL
TRAMADOL UR: NEGATIVE ng/mL
Tapentadol, urine: NEGATIVE ng/mL
Zolpidem, Urine: NEGATIVE ng/mL

## 2015-05-16 LAB — PMP ALCOHOL METABOLITE (ETG): Ethyl Glucuronide (EtG): NEGATIVE ng/mL

## 2015-05-31 NOTE — Progress Notes (Signed)
Urine drug screen for this encounter is consistent for prescribed medication 

## 2015-06-18 ENCOUNTER — Encounter: Payer: Self-pay | Admitting: Physical Medicine & Rehabilitation

## 2015-06-18 ENCOUNTER — Encounter: Payer: 59 | Attending: Physical Medicine & Rehabilitation

## 2015-06-18 ENCOUNTER — Ambulatory Visit (HOSPITAL_BASED_OUTPATIENT_CLINIC_OR_DEPARTMENT_OTHER): Payer: 59 | Admitting: Physical Medicine & Rehabilitation

## 2015-06-18 VITALS — BP 116/51 | HR 75 | Resp 14

## 2015-06-18 DIAGNOSIS — G252 Other specified forms of tremor: Secondary | ICD-10-CM | POA: Diagnosis not present

## 2015-06-18 DIAGNOSIS — I639 Cerebral infarction, unspecified: Secondary | ICD-10-CM | POA: Insufficient documentation

## 2015-06-18 DIAGNOSIS — M533 Sacrococcygeal disorders, not elsewhere classified: Secondary | ICD-10-CM | POA: Insufficient documentation

## 2015-06-18 DIAGNOSIS — R413 Other amnesia: Secondary | ICD-10-CM | POA: Insufficient documentation

## 2015-06-18 DIAGNOSIS — M47816 Spondylosis without myelopathy or radiculopathy, lumbar region: Secondary | ICD-10-CM | POA: Diagnosis not present

## 2015-06-18 DIAGNOSIS — R42 Dizziness and giddiness: Secondary | ICD-10-CM | POA: Insufficient documentation

## 2015-06-18 DIAGNOSIS — G894 Chronic pain syndrome: Secondary | ICD-10-CM | POA: Diagnosis present

## 2015-06-18 DIAGNOSIS — Z5181 Encounter for therapeutic drug level monitoring: Secondary | ICD-10-CM | POA: Insufficient documentation

## 2015-06-18 NOTE — Progress Notes (Signed)
  [facility]   Name: [name] DOB:Apr 04, 1956 MRN: [id number]  Date:06/18/2015  Physician: Alysia Penna, MD    Nurse/CMA: Mancel Parsons  Allergies: No Known Allergies  Consent Signed: Yes.    Is patient diabetic? Yes.    CBG today? 148  Pregnant: No. LMP: No LMP recorded. Patient has had a hysterectomy. (age [age])  Anticoagulants: no Anti-inflammatory: no Antibiotics: no  Procedure: left sacroiliac steroid injection Position: Prone Start Time: 9:55am  End Time: 9:58 am  Fluoro Time: 8  RN/CMA Rolan Bucco Dawson Hollman    Time 9:35 am 10:00am    BP 116/51 102/56    Pulse 75 70    Respirations 14 14    O2 Sat 96 94    S/S 6 6    Pain Level 8/10 6/10     D/C home with daughter in law patient A & O X 3, D/C instructions reviewed, and sits independently.

## 2015-06-18 NOTE — Patient Instructions (Signed)
Sacroiliac injection was performed today. A combination of a naming medicine plus a cortisone medicine was injected. The injection was done under x-ray guidance. This procedure has been performed to help reduce low back and buttocks pain as well as potentially hip pain. The duration of this injection is variable lasting from hours to  Months. It may repeated if needed. 

## 2015-06-28 ENCOUNTER — Ambulatory Visit: Payer: 59 | Admitting: Adult Health

## 2015-07-10 ENCOUNTER — Other Ambulatory Visit: Payer: Self-pay | Admitting: Neurology

## 2015-07-20 ENCOUNTER — Ambulatory Visit: Payer: 59 | Admitting: Physical Medicine & Rehabilitation

## 2015-08-08 ENCOUNTER — Other Ambulatory Visit: Payer: Self-pay | Admitting: Family Medicine

## 2015-08-08 DIAGNOSIS — R112 Nausea with vomiting, unspecified: Secondary | ICD-10-CM

## 2015-08-08 DIAGNOSIS — H8110 Benign paroxysmal vertigo, unspecified ear: Secondary | ICD-10-CM

## 2015-08-08 MED ORDER — ONDANSETRON HCL 4 MG PO TABS: ORAL_TABLET | ORAL | Status: DC

## 2015-08-08 NOTE — Telephone Encounter (Signed)
Daughter called and her mother needs a new prescription sent to her mother's NEW pharmacy. She needs a refill on her Zofran and [facility] t [facility] [address] [place]. jw

## 2015-08-08 NOTE — Telephone Encounter (Signed)
Pharmacy changed in [place], will forward to PCP.

## 2015-08-31 ENCOUNTER — Ambulatory Visit: Payer: Self-pay | Admitting: Family Medicine

## 2015-09-05 ENCOUNTER — Encounter: Payer: Self-pay | Admitting: Family Medicine

## 2015-09-05 ENCOUNTER — Ambulatory Visit (INDEPENDENT_AMBULATORY_CARE_PROVIDER_SITE_OTHER): Payer: BLUE CROSS/BLUE SHIELD | Admitting: Family Medicine

## 2015-09-05 VITALS — BP 128/63 | HR 101 | Temp 98.1°F | Ht 65.0 in | Wt 245.1 lb

## 2015-09-05 DIAGNOSIS — E669 Obesity, unspecified: Secondary | ICD-10-CM | POA: Diagnosis not present

## 2015-09-05 DIAGNOSIS — E785 Hyperlipidemia, unspecified: Secondary | ICD-10-CM

## 2015-09-05 DIAGNOSIS — E1169 Type 2 diabetes mellitus with other specified complication: Secondary | ICD-10-CM | POA: Diagnosis not present

## 2015-09-05 DIAGNOSIS — J0110 Acute frontal sinusitis, unspecified: Secondary | ICD-10-CM | POA: Insufficient documentation

## 2015-09-05 DIAGNOSIS — E119 Type 2 diabetes mellitus without complications: Secondary | ICD-10-CM

## 2015-09-05 LAB — POCT GLYCOSYLATED HEMOGLOBIN (HGB A1C): HEMOGLOBIN A1C: 7.4

## 2015-09-05 MED ORDER — BENZONATATE 100 MG PO CAPS: 100.0000 mg | ORAL_CAPSULE | Freq: Two times a day (BID) | ORAL | Status: DC | PRN

## 2015-09-05 MED ORDER — LEVOFLOXACIN 750 MG PO TABS: 750.0000 mg | ORAL_TABLET | Freq: Every day | ORAL | Status: DC

## 2015-09-05 NOTE — Patient Instructions (Signed)
Thank you for coming in to clinic today.  1. It sounds like you have a Sinusitis (Bacterial Infection) - this most likely started as an Upper Respiratory Virus that has settled into an infection. Additionally there is a chance you may have Pneumonia, however the treatment is the same. - Start Levaquin 1 pill with food and plenty of water) for 7 days, complete entire course, do not stop early even if feeling better - Use Zofran 4mg  tabs for nausea - take TWO pills for 8mg  dose every 8 hours as needed, if this is not helping, contact us and we can try rx for Phenergan. - Try to drink a meal supplement (ensure, boost) once daily for next few days to week to help reduce your nausea, and stabilize your weight. This may help your appetite, if helping you can do twice daily for a short period of time. - Recommend to start keep using Nasal Saline spray multiple times a day to help flush out congestion and clear sinuses - Improve hydration by drinking plenty of clear fluids (water, gatorade) to reduce secretions and thin congestion - Congestion draining down throat can cause irritation. May try warm herbal tea with honey, cough drops - Can take Tylenol or Ibuprofen as needed for fevers  If you develop persistent fever >101F for at least 3 consecutive days, headaches with sinus pain or pressure or productive cough that is worsening, after 10 days then return for re-evaluation  2. Diabetes is doing well, A1c 8.2 down to 7.4, great job. - Keep tracking blood sugars  Recommend Colonscopy and Mammogram.  Please schedule the Eye Doctor appointment for Diabetes exam.  Please schedule a follow-up appointment with Dr Parks Ranger in 4 to 6 months for Diabetes follow-up  If you have any other questions or concerns, please feel free to call the clinic to contact me. You may also schedule an earlier appointment if necessary.  However, if your symptoms get significantly worse, please go to the Emergency Department  to seek immediate medical attention.  [name], [place]

## 2015-09-05 NOTE — Progress Notes (Signed)
Subjective:    [name], female    DOB: November 11, 1955, 60 y.o.   MRN: [id number]  [name] is a 60 y.o. female presenting on 09/05/2015 for chest congestion  Patient presents with daughter today.  HPI  URI / SINUSITIS: - Patient reports symptoms with nasal and sinus congestion, productive cough (thick sputum) worse at night, associated with sinus pressure headache bilateral, started about 4 weeks ago, seemed to have waxing and waning course, at times, but symptoms still persistent and associated with generalized weakness and fatigue. Also describes episodes of fevers (>100F) and chills. - Admits some chronic nausea with reduced appetite, x 1 small vomit (some dry heaves), diarrhea x 1 day (09/04/15) since resolved - Weight down 20 lbs (265 to 245 lbs in 4 months), reduced appetite recently - Tried Zofran for nausea with mild relief - Admits sick contacts at home with daughter having URI and sinusitis symptoms - Tried OTC meds DayQuil, NyQuil, cough syrup  CHRONIC DM, Type 2: CBGs: Avg 150-220, Low 135, High 270 - Checks CBGs 3x daily, has log today Meds: Metformin 1000mg  BID. Never been on insulin Reports good compliance. Tolerating well w/o side-effects Currently not on ACEi/ARB Lifestyle: Diet (trying to improve DM diet, reduced portions and carbs) / Exercise (limited due to chronic pain) - Taking ASA 81 daily, on atorvastatin tolerating - She rescheduled ophtho apt Denies hypoglycemia, polyuria, visual changes, numbness or tingling.  HLD / OBESITY: - Down 20 lbs in 4 months, recently very poor appetite with illness x 1 month - On atorvastatin, tolerating well   Social History  Substance Use Topics  . Smoking status: Never Smoker   . Smokeless tobacco: None  . Alcohol Use: No   Review of Systems Per HPI unless specifically indicated above     Objective:    BP 128/63 mmHg  Pulse 101  Temp(Src) 98.1 F (36.7 C) (Oral)  Ht 5\' 5"  (1.651 m)  Wt 245  lb 1.6 oz (111.177 kg)  BMI 40.79 kg/m2  Wt Readings from Last 3 Encounters:  09/05/15 245 lb 1.6 oz (111.177 kg)  04/26/15 265 lb 7 oz (120.402 kg)  03/29/15 265 lb (120.203 kg)    Physical Exam  Constitutional: She appears well-developed and well-nourished. No distress.  Chronically ill but currently well appearing  HENT:  Head: Normocephalic and atraumatic.  Mouth/Throat: Oropharynx is clear and moist. No oropharyngeal exudate.  Bilateral frontal sinus tenderness. Nares with edematous turbinates and some congestion. Oropharynx clear. Bilateral TMs mild fullness with minimal effusion, no erythema. Dry mucus mem  Eyes: Conjunctivae are normal. Pupils are equal, round, and reactive to light.  Neck: Neck supple.  Cardiovascular: Regular rhythm, normal heart sounds and intact distal pulses.   No murmur heard. Tachycardic  Pulmonary/Chest: Effort normal and breath sounds normal. No respiratory distress. She has no wheezes. She has no rales.  Good air movement, speaks full sentences.  Abdominal: Soft. Bowel sounds are normal. She exhibits no distension and no mass. There is no tenderness. There is no rebound and no guarding.  Obese  Musculoskeletal: She exhibits no edema.  Neurological: She is alert.  Skin: Skin is warm and dry. She is not diaphoretic.  Nursing note and vitals reviewed.  Results for orders placed or performed in visit on 09/05/15  HgB A1c  Result Value Ref Range   Hemoglobin A1C 7.4       Assessment & Plan:   Problem List Items Addressed This Visit  Diabetes mellitus type 2, uncomplicated (HCC) - Primary    Improved control A1c 8.2 to 7.4. Suspect some due to recent 4 month 20 lb wt loss, some due to poor appetite with illness.  Plan: 1. Continue Metformin 1000mg  BID 2. Re-schedule ophtho 3. Cont ASA 81, Atorvastatin 10 4. Discuss adding ACEi next visit, when well and no longer URI/sinusitis 5. Check A1c 3-6 mo      Relevant Orders   HgB A1c (Completed)    Dyslipidemia associated with type 2 diabetes mellitus (HCC)    Tolerating atorvastatin 10 well. Continue      OBESITY, NOS   Subacute frontal sinusitis    Concern bacterial with fevers, persistent sinus pressure, congestion and worsening after initial improve. Differential includes possible CAP after viral illness, however lungs clear.  Plan: 1. Start Levaquin 750 daily x 7 days 2. Inc Zofran to 8mg  q 8 hr PRN, likely reduced appetite with acute illness 3. Supportive care with nasal saline OTC, hydration 4. Return criteria reviewed      Relevant Medications   levofloxacin (LEVAQUIN) 750 MG tablet   benzonatate (TESSALON) 100 MG capsule      Meds ordered this encounter  Medications  . levofloxacin (LEVAQUIN) 750 MG tablet    Sig: Take 1 tablet (750 mg total) by mouth daily. For 7 days    Dispense:  7 tablet    Refill:  0  . benzonatate (TESSALON) 100 MG capsule    Sig: Take 1 capsule (100 mg total) by mouth 2 (two) times daily as needed for cough.    Dispense:  20 capsule    Refill:  0      Follow up plan: Return in about 4 months (around 01/03/2016) for diabetes.  [name], [place], PGY-3

## 2015-09-06 NOTE — Assessment & Plan Note (Signed)
Improved control A1c 8.2 to 7.4. Suspect some due to recent 4 month 20 lb wt loss, some due to poor appetite with illness.  Plan: 1. Continue Metformin 1000mg  BID 2. Re-schedule ophtho 3. Cont ASA 81, Atorvastatin 10 4. Discuss adding ACEi next visit, when well and no longer URI/sinusitis 5. Check A1c 3-6 mo

## 2015-09-06 NOTE — Assessment & Plan Note (Signed)
Concern bacterial with fevers, persistent sinus pressure, congestion and worsening after initial improve. Differential includes possible CAP after viral illness, however lungs clear.  Plan: 1. Start Levaquin 750 daily x 7 days 2. Inc Zofran to 8mg  q 8 hr PRN, likely reduced appetite with acute illness 3. Supportive care with nasal saline OTC, hydration 4. Return criteria reviewed

## 2015-09-06 NOTE — Assessment & Plan Note (Signed)
Tolerating atorvastatin 10 well. Continue

## 2015-09-13 ENCOUNTER — Telehealth: Payer: Self-pay | Admitting: Family Medicine

## 2015-09-13 DIAGNOSIS — E119 Type 2 diabetes mellitus without complications: Secondary | ICD-10-CM

## 2015-09-13 MED ORDER — ONETOUCH ULTRASOFT LANCETS MISC: Status: DC

## 2015-09-13 MED ORDER — GLUCOSE BLOOD VI STRP: ORAL_STRIP | Status: DC

## 2015-09-13 NOTE — Telephone Encounter (Addendum)
Patient's new insurance is [employer] active 07/22/2015.  Patient is a Type 2 Diabetic, non-insulin dependent (never been on insulin), only takes metformin for diabetes.  I re-sent rx refills e-script for both OneTouch Test strips and OneTouch Ultrasoft Lancets, instructions to check glucose once daily. #100, +5 refills. Comments to pharmacy, quantity sufficient for once daily glucose testing for 3 month supply in patient not dependent on insulin. [id number], uncomplicated DM2.  [facility] [place], spoke with pharmacist, they stated that after re-checking the OneTouch strips they are "not covered" and found that the Freestyle Meter, Test strips and lancets were covered with additional co-pay for her. Pharmacist took verbal order for new rx Freestyle Glucometer device, Freestyle Test Strips and Lancets each up to max 300 dispensed, +5 refills with instructions to "check glucose up to TID as directed", she may choose to check once daily or up to TID. Pharmacist will contact her to see which she prefers, she may fill as low as 100 strips/lancets if prefers.  [name], [place], PGY-3

## 2015-09-13 NOTE — Telephone Encounter (Signed)
Pt called because she now has new insurance and they will not cover her test strips for the One Touch Ultra. She needs [place] to see what meter they will cover and order that and all the lancets, strips that go with it. jw

## 2015-10-12 ENCOUNTER — Other Ambulatory Visit: Payer: Self-pay | Admitting: Family Medicine

## 2015-10-12 DIAGNOSIS — F39 Unspecified mood [affective] disorder: Secondary | ICD-10-CM

## 2015-10-12 DIAGNOSIS — G894 Chronic pain syndrome: Secondary | ICD-10-CM

## 2015-10-12 DIAGNOSIS — M47816 Spondylosis without myelopathy or radiculopathy, lumbar region: Secondary | ICD-10-CM

## 2015-10-19 LAB — GLUCOSE, POCT (MANUAL RESULT ENTRY): POC Glucose: 151 mg/dl — AB (ref 70–99)

## 2015-11-07 ENCOUNTER — Other Ambulatory Visit: Payer: Self-pay | Admitting: Family Medicine

## 2015-11-07 DIAGNOSIS — K219 Gastro-esophageal reflux disease without esophagitis: Secondary | ICD-10-CM

## 2015-11-07 DIAGNOSIS — E1169 Type 2 diabetes mellitus with other specified complication: Secondary | ICD-10-CM

## 2015-11-07 DIAGNOSIS — E785 Hyperlipidemia, unspecified: Secondary | ICD-10-CM

## 2015-11-19 ENCOUNTER — Other Ambulatory Visit: Payer: Self-pay | Admitting: Neurology

## 2015-11-19 ENCOUNTER — Other Ambulatory Visit: Payer: Self-pay | Admitting: *Deleted

## 2015-11-19 DIAGNOSIS — M47816 Spondylosis without myelopathy or radiculopathy, lumbar region: Secondary | ICD-10-CM

## 2015-11-19 MED ORDER — MELOXICAM 15 MG PO TABS: 15.0000 mg | ORAL_TABLET | Freq: Every day | ORAL | Status: DC

## 2015-11-21 ENCOUNTER — Other Ambulatory Visit: Payer: Self-pay | Admitting: Family Medicine

## 2015-11-21 DIAGNOSIS — G894 Chronic pain syndrome: Secondary | ICD-10-CM

## 2016-01-18 ENCOUNTER — Telehealth: Payer: Self-pay | Admitting: Family Medicine

## 2016-01-18 NOTE — Telephone Encounter (Signed)
Pt is calling because her dentist was faxing some forms for her PCP to fill out. The needed the okay from her PCP because of her medications. She is having several teeth removed and needed that information. Please call patient and let her know. jw

## 2016-01-18 NOTE — Telephone Encounter (Signed)
Called patient back, spoke with [name], she reports that she saw her dentist and they are planning future dental extractions for tooth decay. Due to her chronic conditions and medications, she states that the dentist is requesting medical clearance and agreement with proceeding with anesthesia. She states that they should be faxing over a document requiring her physician to sign off and fax back.  I checked mail box at end of day today 6/30 at 5:00pm without any sign of this fax at this time. I notified patient that it still may be in process, and once arrives it should be forwarded to new PCP Dr Avon Gully.  [name], [place], PGY-3

## 2016-01-19 ENCOUNTER — Other Ambulatory Visit: Payer: Self-pay | Admitting: Neurology

## 2016-02-13 ENCOUNTER — Other Ambulatory Visit: Payer: Self-pay | Admitting: *Deleted

## 2016-02-13 DIAGNOSIS — G894 Chronic pain syndrome: Secondary | ICD-10-CM

## 2016-02-13 DIAGNOSIS — F39 Unspecified mood [affective] disorder: Secondary | ICD-10-CM

## 2016-02-14 ENCOUNTER — Other Ambulatory Visit: Payer: Self-pay | Admitting: *Deleted

## 2016-02-14 DIAGNOSIS — G894 Chronic pain syndrome: Secondary | ICD-10-CM

## 2016-02-14 DIAGNOSIS — H8110 Benign paroxysmal vertigo, unspecified ear: Secondary | ICD-10-CM

## 2016-02-14 DIAGNOSIS — R112 Nausea with vomiting, unspecified: Secondary | ICD-10-CM

## 2016-02-14 DIAGNOSIS — L309 Dermatitis, unspecified: Secondary | ICD-10-CM

## 2016-02-14 MED ORDER — DULOXETINE HCL 60 MG PO CPEP: 60.0000 mg | ORAL_CAPSULE | Freq: Every day | ORAL | 5 refills | Status: DC

## 2016-02-15 ENCOUNTER — Other Ambulatory Visit: Payer: Self-pay | Admitting: Family Medicine

## 2016-02-15 DIAGNOSIS — M47816 Spondylosis without myelopathy or radiculopathy, lumbar region: Secondary | ICD-10-CM

## 2016-02-16 ENCOUNTER — Other Ambulatory Visit: Payer: Self-pay | Admitting: Family Medicine

## 2016-02-16 DIAGNOSIS — G894 Chronic pain syndrome: Secondary | ICD-10-CM

## 2016-02-16 DIAGNOSIS — M47896 Other spondylosis, lumbar region: Secondary | ICD-10-CM

## 2016-02-18 MED ORDER — GABAPENTIN 800 MG PO TABS: 800.0000 mg | ORAL_TABLET | Freq: Three times a day (TID) | ORAL | 0 refills | Status: DC

## 2016-02-18 NOTE — Telephone Encounter (Signed)
Patient has appointment with PCP on 8/4.

## 2016-02-22 ENCOUNTER — Encounter: Payer: Self-pay | Admitting: Internal Medicine

## 2016-02-22 ENCOUNTER — Ambulatory Visit (INDEPENDENT_AMBULATORY_CARE_PROVIDER_SITE_OTHER): Payer: Commercial Managed Care - HMO | Admitting: Internal Medicine

## 2016-02-22 VITALS — BP 130/80 | HR 64 | Temp 98.8°F | Ht 65.0 in | Wt 237.0 lb

## 2016-02-22 DIAGNOSIS — L309 Dermatitis, unspecified: Secondary | ICD-10-CM

## 2016-02-22 DIAGNOSIS — K089 Disorder of teeth and supporting structures, unspecified: Secondary | ICD-10-CM

## 2016-02-22 DIAGNOSIS — M8580 Other specified disorders of bone density and structure, unspecified site: Secondary | ICD-10-CM

## 2016-02-22 DIAGNOSIS — M47896 Other spondylosis, lumbar region: Secondary | ICD-10-CM

## 2016-02-22 DIAGNOSIS — E1169 Type 2 diabetes mellitus with other specified complication: Secondary | ICD-10-CM

## 2016-02-22 DIAGNOSIS — F39 Unspecified mood [affective] disorder: Secondary | ICD-10-CM

## 2016-02-22 DIAGNOSIS — H8110 Benign paroxysmal vertigo, unspecified ear: Secondary | ICD-10-CM | POA: Diagnosis not present

## 2016-02-22 DIAGNOSIS — E119 Type 2 diabetes mellitus without complications: Secondary | ICD-10-CM | POA: Diagnosis not present

## 2016-02-22 DIAGNOSIS — R112 Nausea with vomiting, unspecified: Secondary | ICD-10-CM

## 2016-02-22 DIAGNOSIS — E785 Hyperlipidemia, unspecified: Secondary | ICD-10-CM

## 2016-02-22 DIAGNOSIS — M47816 Spondylosis without myelopathy or radiculopathy, lumbar region: Secondary | ICD-10-CM

## 2016-02-22 DIAGNOSIS — G894 Chronic pain syndrome: Secondary | ICD-10-CM

## 2016-02-22 DIAGNOSIS — G8929 Other chronic pain: Secondary | ICD-10-CM

## 2016-02-22 DIAGNOSIS — K219 Gastro-esophageal reflux disease without esophagitis: Secondary | ICD-10-CM

## 2016-02-22 LAB — POCT GLYCOSYLATED HEMOGLOBIN (HGB A1C): HEMOGLOBIN A1C: 6.6

## 2016-02-22 MED ORDER — LISINOPRIL 10 MG PO TABS: 10.0000 mg | ORAL_TABLET | Freq: Every day | ORAL | 2 refills | Status: DC

## 2016-02-22 MED ORDER — ALENDRONATE SODIUM 70 MG PO TABS: ORAL_TABLET | ORAL | 2 refills | Status: DC

## 2016-02-22 MED ORDER — METFORMIN HCL 1000 MG PO TABS: 1000.0000 mg | ORAL_TABLET | Freq: Two times a day (BID) | ORAL | 3 refills | Status: DC

## 2016-02-22 MED ORDER — TRIAMCINOLONE ACETONIDE 0.5 % EX CREA: 1.0000 "application " | TOPICAL_CREAM | Freq: Three times a day (TID) | CUTANEOUS | 2 refills | Status: DC

## 2016-02-22 MED ORDER — OMEPRAZOLE 20 MG PO CPDR: 20.0000 mg | DELAYED_RELEASE_CAPSULE | Freq: Every day | ORAL | 2 refills | Status: DC

## 2016-02-22 MED ORDER — GABAPENTIN 800 MG PO TABS: 800.0000 mg | ORAL_TABLET | Freq: Three times a day (TID) | ORAL | 2 refills | Status: DC

## 2016-02-22 MED ORDER — ONDANSETRON HCL 4 MG PO TABS: ORAL_TABLET | ORAL | 2 refills | Status: DC

## 2016-02-22 MED ORDER — ATORVASTATIN CALCIUM 10 MG PO TABS: 10.0000 mg | ORAL_TABLET | Freq: Every day | ORAL | 2 refills | Status: DC

## 2016-02-22 MED ORDER — CYCLOBENZAPRINE HCL 10 MG PO TABS
10.0000 mg | ORAL_TABLET | Freq: Three times a day (TID) | ORAL | 2 refills | Status: DC | PRN
Start: 2016-02-22 — End: 2016-02-22

## 2016-02-22 MED ORDER — MELOXICAM 15 MG PO TABS: 15.0000 mg | ORAL_TABLET | Freq: Every day | ORAL | 2 refills | Status: DC

## 2016-02-22 MED ORDER — DULOXETINE HCL 60 MG PO CPEP: 60.0000 mg | ORAL_CAPSULE | Freq: Every day | ORAL | 2 refills | Status: DC

## 2016-02-22 MED ORDER — PROPRANOLOL HCL 60 MG PO TABS: 60.0000 mg | ORAL_TABLET | Freq: Three times a day (TID) | ORAL | 2 refills | Status: DC

## 2016-02-22 MED ORDER — CYCLOBENZAPRINE HCL 10 MG PO TABS: 10.0000 mg | ORAL_TABLET | Freq: Three times a day (TID) | ORAL | 2 refills | Status: DC | PRN

## 2016-02-22 NOTE — Progress Notes (Signed)
   Subjective:    [name], female    DOB: 16-Dec-1955, 60 y.o.   MRN: [id number]  HPI  Patient presents for DM f/u and dental pain.   Type II DM Currently measuring blood sugar twice a day. Was measuring three times daily, but then insurance stopped covering her test strips, so she is now testing only twice daily to save money. Reports fasting around 170s and post-prandial in low 200s. Has not seen an ophthalmologist in a while but says she keeps meaning to schedule an appointment. Does endorse some blurry vision. Endorses tingling in her feet that is worse when sitting down. Does not wish to begin gabapentin or any other new medications at this time.   Dental pain Patient reporting dental pain for the past few months. Said she has chipped multiple teeth while eating recently. Excedrin helps with the pain. She has an appointment scheduled with her dentist to have the affected teeth removed, but needs medical clearance by PCP before her dentist will proceed. Has form to be filled out today.   Review of Systems See HPI.     Objective:   Physical Exam  Constitutional: She is oriented to person, place, and time. She appears well-developed and well-nourished. No distress.  HENT:  Head: Normocephalic and atraumatic.  Cardiovascular: Normal rate, regular rhythm and normal heart sounds.   No murmur heard. Pulmonary/Chest: Effort normal and breath sounds normal. No respiratory distress. She has no wheezes.  Neurological: She is alert and oriented to person, place, and time.  Psychiatric: She has a normal mood and affect. Her behavior is normal.  Vitals reviewed.     Assessment & Plan:  Diabetes mellitus type 2, uncomplicated Well-controlled. A1C 6.6 today (improved from 7.4 at last visit in February).  - Continue metformin 1000mg  BID - Will not begin gabapentin at this time for neuropathy per patient preference  - F/u in three months with repeat A1C at that time  Chronic  dental pain Currently followed by dentist with plans to have affected teeth removed pending medical clearance.  - Completed medical clearance forms today and faxed to dentist's office  Adin Hector, MD, MPH PGY-2 [name] Pager [phone]

## 2016-02-22 NOTE — Assessment & Plan Note (Signed)
Currently followed by dentist with plans to have affected teeth removed pending medical clearance.  - Completed medical clearance forms today and faxed to dentist's office

## 2016-02-22 NOTE — Patient Instructions (Addendum)
It was nice meeting you today Ms. Eisenhuth!  We have started a new medication for you today called lisinopril. Please begin one 10 mg tablet a day. Please continue to take your other medications as you have been.   We will see you back in three months.   If you have any questions or concerns, please feel free to call the clinic.   Be well,  Dr. Avon Gully

## 2016-02-22 NOTE — Assessment & Plan Note (Signed)
Well-controlled. A1C 6.6 today (improved from 7.4 at last visit in February).  - Continue metformin 1000mg  BID - Will not begin gabapentin at this time for neuropathy per patient preference  - F/u in three months with repeat A1C at that time

## 2016-02-25 ENCOUNTER — Encounter: Payer: Self-pay | Admitting: Gastroenterology

## 2016-02-25 ENCOUNTER — Telehealth: Payer: Self-pay | Admitting: *Deleted

## 2016-02-25 NOTE — Telephone Encounter (Signed)
Received a call from Dr. Tandy Gaw, DDS, [facility] office stating they received medical clearance form for patient but the Medical Services Request part was not complete.  They are asking if the provider could complete that part and fax it back.  Form placed in provider box for review.  Derl Barrow, RN

## 2016-03-03 ENCOUNTER — Ambulatory Visit (AMBULATORY_SURGERY_CENTER): Payer: Self-pay | Admitting: *Deleted

## 2016-03-03 ENCOUNTER — Encounter (INDEPENDENT_AMBULATORY_CARE_PROVIDER_SITE_OTHER): Payer: Self-pay

## 2016-03-03 VITALS — Ht 65.0 in | Wt 241.0 lb

## 2016-03-03 DIAGNOSIS — Z1211 Encounter for screening for malignant neoplasm of colon: Secondary | ICD-10-CM

## 2016-03-03 MED ORDER — NA SULFATE-K SULFATE-MG SULF 17.5-3.13-1.6 GM/177ML PO SOLN: 1.0000 | Freq: Once | ORAL | 0 refills | Status: AC

## 2016-03-03 NOTE — Progress Notes (Signed)
No egg or soy allergy known to patient  No issues with past sedation with any surgeries  or procedures except posy op N/V , no intubation problems  No diet pills per patient No home 02 use per patient  No blood thinners per patient  Pt denies issues with constipation

## 2016-03-14 ENCOUNTER — Ambulatory Visit (AMBULATORY_SURGERY_CENTER): Payer: Commercial Managed Care - HMO | Admitting: Gastroenterology

## 2016-03-14 ENCOUNTER — Encounter: Payer: Self-pay | Admitting: Gastroenterology

## 2016-03-14 VITALS — BP 96/61 | HR 65 | Temp 96.9°F | Resp 17 | Ht 65.0 in | Wt 241.0 lb

## 2016-03-14 DIAGNOSIS — D122 Benign neoplasm of ascending colon: Secondary | ICD-10-CM | POA: Diagnosis not present

## 2016-03-14 DIAGNOSIS — Z1211 Encounter for screening for malignant neoplasm of colon: Secondary | ICD-10-CM

## 2016-03-14 DIAGNOSIS — D120 Benign neoplasm of cecum: Secondary | ICD-10-CM

## 2016-03-14 DIAGNOSIS — D125 Benign neoplasm of sigmoid colon: Secondary | ICD-10-CM

## 2016-03-14 HISTORY — PX: COLONOSCOPY: SHX174

## 2016-03-14 LAB — GLUCOSE, CAPILLARY
GLUCOSE-CAPILLARY: 118 mg/dL — AB (ref 65–99)
GLUCOSE-CAPILLARY: 96 mg/dL (ref 65–99)

## 2016-03-14 MED ORDER — SODIUM CHLORIDE 0.9 % IV SOLN: 500.0000 mL | INTRAVENOUS | Status: DC

## 2016-03-14 NOTE — Progress Notes (Signed)
Called to room to assist during endoscopic procedure.  Patient ID and intended procedure confirmed with present staff. Received instructions for my participation in the procedure from the performing physician.  

## 2016-03-14 NOTE — Patient Instructions (Signed)
YOU HAD AN ENDOSCOPIC PROCEDURE TODAY AT [facility]:   Refer to the procedure report that was given to you for any specific questions about what was found during the examination.  If the procedure report does not answer your questions, please call your gastroenterologist to clarify.  If you requested that your care partner not be given the details of your procedure findings, then the procedure report has been included in a sealed envelope for you to review at your convenience later.  YOU SHOULD EXPECT: Some feelings of bloating in the abdomen. Passage of more gas than usual.  Walking can help get rid of the air that was put into your GI tract during the procedure and reduce the bloating. If you had a lower endoscopy (such as a colonoscopy or flexible sigmoidoscopy) you may notice spotting of blood in your stool or on the toilet paper. If you underwent a bowel prep for your procedure, you may not have a normal bowel movement for a few days.  Please Note:  You might notice some irritation and congestion in your nose or some drainage.  This is from the oxygen used during your procedure.  There is no need for concern and it should clear up in a day or so.  SYMPTOMS TO REPORT IMMEDIATELY:   Following lower endoscopy (colonoscopy or flexible sigmoidoscopy):  Excessive amounts of blood in the stool  Significant tenderness or worsening of abdominal pains  Swelling of the abdomen that is new, acute  Fever of 100F or higher   For urgent or emergent issues, a gastroenterologist can be reached at any hour by calling [phone].   DIET:  We do recommend a small meal at first, but then you may proceed to your regular diet.  Drink plenty of fluids but you should avoid alcoholic beverages for 24 hours.  ACTIVITY:  You should plan to take it easy for the rest of today and you should NOT DRIVE or use heavy machinery until tomorrow (because of the sedation medicines used during the test).     FOLLOW UP: Our staff will call the number listed on your records the next business day following your procedure to check on you and address any questions or concerns that you may have regarding the information given to you following your procedure. If we do not reach you, we will leave a message.  However, if you are feeling well and you are not experiencing any problems, there is no need to return our call.  We will assume that you have returned to your regular daily activities without incident.  If any biopsies were taken you will be contacted by phone or by letter within the next 1-3 weeks.  Please call us at [phone] if you have not heard about the biopsies in 3 weeks.    SIGNATURES/CONFIDENTIALITY: You and/or your care partner have signed paperwork which will be entered into your electronic medical record.  These signatures attest to the fact that that the information above on your After Visit Summary has been reviewed and is understood.  Full responsibility of the confidentiality of this discharge information lies with you and/or your care-partner.  No Aspirin nor NSAIDS for two weeks to prevent bleeding.  Thank-you for choosing [place] for your healthcare needs today.

## 2016-03-14 NOTE — Op Note (Signed)
[place] Patient Name: [name] Procedure Date: 03/14/2016 8:52 AM MRN: [id number] Endoscopist: Remo Lipps P. Havery Moros , MD Age: 60 Referring MD:  Date of Birth: 1956/07/11 Gender: Female Account #: [id number] Procedure:                Colonoscopy Indications:              Screening for malignant neoplasm in the colon, This                            is the patient's first colonoscopy Medicines:                Monitored Anesthesia Care Procedure:                Pre-Anesthesia Assessment:                           - Prior to the procedure, a History and Physical                            was performed, and patient medications and                            allergies were reviewed. The patient's tolerance of                            previous anesthesia was also reviewed. The risks                            and benefits of the procedure and the sedation                            options and risks were discussed with the patient.                            All questions were answered, and informed consent                            was obtained. Prior Anticoagulants: The patient has                            taken aspirin, last dose was 1 day prior to                            procedure. ASA Grade Assessment: II - A patient                            with mild systemic disease. After reviewing the                            risks and benefits, the patient was deemed in                            satisfactory condition to undergo the procedure.  After obtaining informed consent, the colonoscope                            was passed under direct vision. Throughout the                            procedure, the patient's blood pressure, pulse, and                            oxygen saturations were monitored continuously. The                            Model CF-HQ190L [id number]) scope was introduced                            through the anus and  advanced to the the cecum,                            identified by appendiceal orifice and ileocecal                            valve. The patient tolerated the procedure well.                            The colonoscopy was technically difficult and                            complex due to restricted mobility of the colon.                            The ileocecal valve, appendiceal orifice, and                            rectum were photographed. The quality of the bowel                            preparation was good. The ileocecal valve,                            appendiceal orifice, and rectum were photographed. Scope In: 8:55:42 AM Scope Out: 9:22:33 AM Scope Withdrawal Time: 0 hours 16 minutes 24 seconds  Total Procedure Duration: 0 hours 26 minutes 51 seconds  Findings:                 The perianal and digital rectal examinations were                            normal.                           A 4 mm polyp was found in the cecum. The polyp was                            sessile. The polyp was removed with a cold snare.  Resection and retrieval were complete.                           A 4 mm polyp was found in the ascending colon. The                            polyp was flat. The polyp was removed with a cold                            biopsy forceps. Resection and retrieval were                            complete.                           A 6 mm polyp was found in the sigmoid colon. The                            polyp was sessile. The polyp was removed with a                            cold snare. Resection and retrieval were complete.                           The colon was tortuous with restricted mobility of                            the left colon, which led to a prolonged cecal                            intubation.                           Non-bleeding internal hemorrhoids were found during                            retroflexion.                            A single small angiodysplastic lesion was found in                            the descending colon.                           The exam was otherwise without abnormality. Complications:            No immediate complications. Estimated blood loss:                            Minimal. Estimated Blood Loss:     Estimated blood loss was minimal. Impression:               - One 4 mm polyp in the cecum, removed with a cold  snare. Resected and retrieved.                           - One 4 mm polyp in the ascending colon, removed                            with a cold biopsy forceps. Resected and retrieved.                           - One 6 mm polyp in the sigmoid colon, removed with                            a cold snare. Resected and retrieved.                           - Tortuous colon, restricted mobility of the left                            colon.                           - Non-bleeding internal hemorrhoids.                           - A single colonic angiodysplastic lesion.                           - The examination was otherwise normal. Recommendation:           - Patient has a contact number available for                            emergencies. The signs and symptoms of potential                            delayed complications were discussed with the                            patient. Return to normal activities tomorrow.                            Written discharge instructions were provided to the                            patient.                           - Resume previous diet.                           - Continue present medications.                           - No aspirin, ibuprofen, naproxen, or other  non-steroidal anti-inflammatory drugs for 2 weeks                            after polyp removal.                           - Await pathology results.                           - Repeat colonoscopy is  recommended for                            surveillance. The colonoscopy date will be                            determined after pathology results from today's                            exam become available for review. Remo Lipps P. Havery Moros, MD 03/14/2016 9:28:32 AM This report has been signed electronically.

## 2016-03-14 NOTE — Progress Notes (Signed)
A and O x3. Report to RN. Tolerated MAC anesthesia well. 

## 2016-03-17 ENCOUNTER — Telehealth: Payer: Self-pay

## 2016-03-17 NOTE — Telephone Encounter (Signed)
  Follow up Call-  Call back number 03/14/2016  Post procedure Call Back phone  # [phone]  Permission to leave phone message Yes  Some recent data might be hidden     Patient was called for follow up after her procedure on 03/14/2016. No answer at the number given for follow up phone call. A message was left on the answering machine.

## 2016-03-18 ENCOUNTER — Encounter: Payer: Self-pay | Admitting: Gastroenterology

## 2016-05-23 ENCOUNTER — Ambulatory Visit (INDEPENDENT_AMBULATORY_CARE_PROVIDER_SITE_OTHER): Payer: Commercial Managed Care - HMO | Admitting: Internal Medicine

## 2016-05-23 ENCOUNTER — Encounter: Payer: Self-pay | Admitting: Internal Medicine

## 2016-05-23 VITALS — BP 112/61 | HR 72 | Temp 98.2°F | Ht 65.0 in | Wt 240.0 lb

## 2016-05-23 DIAGNOSIS — M79671 Pain in right foot: Secondary | ICD-10-CM | POA: Diagnosis not present

## 2016-05-23 DIAGNOSIS — H8110 Benign paroxysmal vertigo, unspecified ear: Secondary | ICD-10-CM

## 2016-05-23 DIAGNOSIS — Z23 Encounter for immunization: Secondary | ICD-10-CM

## 2016-05-23 DIAGNOSIS — E119 Type 2 diabetes mellitus without complications: Secondary | ICD-10-CM

## 2016-05-23 DIAGNOSIS — M47896 Other spondylosis, lumbar region: Secondary | ICD-10-CM

## 2016-05-23 DIAGNOSIS — B078 Other viral warts: Secondary | ICD-10-CM

## 2016-05-23 DIAGNOSIS — W19XXXA Unspecified fall, initial encounter: Secondary | ICD-10-CM | POA: Diagnosis not present

## 2016-05-23 DIAGNOSIS — M79672 Pain in left foot: Secondary | ICD-10-CM

## 2016-05-23 DIAGNOSIS — B079 Viral wart, unspecified: Secondary | ICD-10-CM | POA: Insufficient documentation

## 2016-05-23 LAB — POCT GLYCOSYLATED HEMOGLOBIN (HGB A1C): Hemoglobin A1C: 6.7

## 2016-05-23 MED ORDER — MECLIZINE HCL 12.5 MG PO TABS: 12.5000 mg | ORAL_TABLET | Freq: Three times a day (TID) | ORAL | 2 refills | Status: DC | PRN

## 2016-05-23 NOTE — Assessment & Plan Note (Signed)
Provided prescription for new heel insoles today.

## 2016-05-23 NOTE — Patient Instructions (Addendum)
It was nice seeing you again today Mrs. Bensman!  Please begin taking meclizine instead of dramamine for your dizziness.   It is also important to schedule a mammogram at your earliest convenience.   I will see you back in three months, and you will also have your appointment in [facility] with Dr. McDiarmid.   If you have any questions or concerns, please feel free to call the clinic.   Be well,  Dr. Avon Gully

## 2016-05-23 NOTE — Assessment & Plan Note (Signed)
Wrote prescription for new back brace today.

## 2016-05-23 NOTE — Assessment & Plan Note (Signed)
Patient thinks that current dramamine is causing her to fall, however she would like to continue a medication for dizziness. Will try meclizine instead of dramamine.  - D/c dramamine - Begin meclizine TID PRN - F/u with Dr. McDiarmid in geri clinic for falls to rule out causes other than vertigo

## 2016-05-23 NOTE — Progress Notes (Signed)
Subjective:    [name], female    DOB: [birth year], 60 y.o.   MRN: [id number]  HPI  Patient presents for diabetes f/u as well as to discuss other concerns.   Type II DM Patient thinks is well-controlled. She is still taking metformin 1000mg  BID as prescribed, as well as gabapentin 800mg  TID. Does report that her feet are tingling more now and are achy at times, though she thinks the aching may be due in part to degeneration of her insoles. Checks blood sugar twice a day. Usually has readings in the mid 100s first thing in the morning, with lowest of 113. Measurements after dinner around 170-180. Reports she has never gotten a reading higher than 200. Still has not seen ophthalmology. Reports blurry vision but says this is not new and she thinks her glasses need to be updated. Denies any foot ulcers or lesions.   Recent falls Patient's daughter is with her at appointment today, and is concerned because patient has had four falls in the past three months since her last appointment. One month ago she fell and hit her forehead and subsequently developed a black eye. At that time she also fell onto her knees, and reports knee pain on the R since that time. She has been taking meloxicam with some pain relief. Last fall occurred about a week ago. Patient thinks that the dramamine she is taking for BPPV contributes to her falls. Says that falls typically occur as a combination of dizziness which she attributes to BPPV as well as worsening balance. She has a shower chair now which prevents her from falling when showering. Has also started using a rolling walker with a chair at home, which she says helps a lot. 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   Skin lesions First noticed about a year ago. Previously evaluated by Dr. Parks Ranger, who told her they were  warts. Has never tried any treatment for them. Denies pain or itching. Began on her hands, and have now spread up her arms.   Back and heel pain Patient prescribed back brace for degeneration disc disease multiple years ago, and heel inserts for plantar fasciitis about two years ago. She reports good pain relief of both her back and her heels with brace and insoles respectively. Brace has now stretched out and is difficult to keep on. Insoles are starting to wear down and are not as effective. Patient is asking for prescription for new brace and new insoles.   Smoking status reviewed.  Social: Patient lives at home with her daughter, who is her primary caretaker.   Review of Systems See HPI.     Objective:   Physical Exam  Constitutional: She is oriented to person, place, and time. She appears well-developed and well-nourished. No distress.  HENT:  Head: Normocephalic and atraumatic.  Nose: Nose normal.  Mouth/Throat: Oropharynx is clear and moist. No oropharyngeal exudate.  Eyes: Conjunctivae and EOM are normal. Pupils are equal, round, and reactive to light. Right eye exhibits no discharge. Left eye exhibits no discharge. No scleral icterus.  Pulmonary/Chest: Effort normal. No respiratory distress.  Neurological: She is alert and oriented to person, place, and time.  Skin:  Multiple flesh-colored raised lesions on both hands and arms. Non-tender to palpation. Appear most consistent with warts.   Psychiatric: She has a normal mood  and affect. Her behavior is normal.   Diabetic Foot Exam - Simple   Simple Foot Form Diabetic Foot exam was performed with the following findings:  Yes 05/23/2016 11:28 AM  Visual Inspection No deformities, no ulcerations, no other skin breakdown bilaterally:  Yes Sensation Testing See comments:  Yes Pulse Check Posterior Tibialis and Dorsalis pulse intact bilaterally:  Yes Comments Diminished sensation on R foot, primarily R heel, when compared to L  foot.         Assessment & Plan:  Diabetes mellitus type 2, uncomplicated Well-controlled. A1C 6.7 today (6.4 at last visit, but improved from >7 prior). Reports no side effects from metformin, including GI. Denies hypoglycemia episodes. Endorses worsening neuropathy and does have slightly diminished sensation on R foot on foot exam today, however is already on max dose gabapentin. Still has not seen ophtho.  - Continue metforming 1000mg  BID - Continue gabapentin 800mg  TID - Schedule ophtho appt - F/u in three months  Falls Has fallen four times in past three months. Hit forehead on one occasion, but has not hit head otherwise. Patient with hx of BPPV, and thinks that falls typically occur due to combination of dizziness from this as well as worsening balance. Has recently started using a walker in house which has helped decrease frequency of falls. No neuro deficits on exam today. Patient able to walk without walker with no gait abnormalities. Thinks dramamine makes her fall, but she wants to continue a medication for BPPV.  - D/c dramamine and begin meclizine PRN instead for BPPV - Will schedule appointment with Dr. McDiarmid at [facility] for full geri eval - Continue meloxicam for knee pain  Heel pain Provided prescription for new heel insoles today.   Degenerative joint disease (DJD) of lumbar spine Wrote prescription for new back brace today.   Benign paroxysmal positional vertigo Patient thinks that current dramamine is causing her to fall, however she would like to continue a medication for dizziness. Will try meclizine instead of dramamine.  - D/c dramamine - Begin meclizine TID PRN - F/u with Dr. McDiarmid in geri clinic for falls to rule out causes other than vertigo  Warts Of upper extremities bilaterally. Appearance most consistent with warts. As not bothering patient and as they are plentiful, will not treat at this time. Told patient that if some of the larger lesions  start to bother her, we can consider cryotherapy.   Adin Hector, MD, MPH PGY-2 [facility] Pager [phone]

## 2016-05-23 NOTE — Assessment & Plan Note (Signed)
Has fallen four times in past three months. Hit forehead on one occasion, but has not hit head otherwise. Patient with hx of BPPV, and thinks that falls typically occur due to combination of dizziness from this as well as worsening balance. Has recently started using a walker in house which has helped decrease frequency of falls. No neuro deficits on exam today. Patient able to walk without walker with no gait abnormalities. Thinks dramamine makes her fall, but she wants to continue a medication for BPPV.  - D/c dramamine and begin meclizine PRN instead for BPPV - Will schedule appointment with Dr. McDiarmid at [facility] clinic for full geri eval - Continue meloxicam for knee pain

## 2016-05-23 NOTE — Assessment & Plan Note (Signed)
Well-controlled. A1C 6.7 today (6.4 at last visit, but improved from >7 prior). Reports no side effects from metformin, including GI. Denies hypoglycemia episodes. Endorses worsening neuropathy and does have slightly diminished sensation on R foot on foot exam today, however is already on max dose gabapentin. Still has not seen ophtho.  - Continue metforming 1000mg  BID - Continue gabapentin 800mg  TID - Schedule ophtho appt - F/u in three months

## 2016-05-23 NOTE — Assessment & Plan Note (Signed)
Of upper extremities bilaterally. Appearance most consistent with warts. As not bothering patient and as they are plentiful, will not treat at this time. Told patient that if some of the larger lesions start to bother her, we can consider cryotherapy.

## 2016-07-24 ENCOUNTER — Ambulatory Visit (INDEPENDENT_AMBULATORY_CARE_PROVIDER_SITE_OTHER): Payer: Commercial Managed Care - HMO | Admitting: Family Medicine

## 2016-07-24 VITALS — BP 118/60 | HR 71 | Temp 97.9°F | Ht 65.0 in | Wt 250.0 lb

## 2016-07-24 DIAGNOSIS — W19XXXA Unspecified fall, initial encounter: Secondary | ICD-10-CM

## 2016-07-24 DIAGNOSIS — R296 Repeated falls: Secondary | ICD-10-CM

## 2016-07-24 DIAGNOSIS — Z7409 Other reduced mobility: Secondary | ICD-10-CM | POA: Insufficient documentation

## 2016-07-24 DIAGNOSIS — N3941 Urge incontinence: Secondary | ICD-10-CM

## 2016-07-24 DIAGNOSIS — H8110 Benign paroxysmal vertigo, unspecified ear: Secondary | ICD-10-CM

## 2016-07-24 NOTE — Patient Instructions (Signed)
Take the Vitamin D 1000 IU every day.  Dr Roderick Calo let Dr Avon Gully know about the possible contribution of falls from cyyclobenzaprine with suggestions for safer muscle relaxant medications.    We will set up a consultation with the [facility].  [facility] will contact you to set up an appointment date.   It is a good idea to go see you eye doctor for possible new prescription.  Good Vision is vital to balance.

## 2016-07-24 NOTE — Progress Notes (Signed)
[name] is accompanied by daughter [name] is/are patient, relative(s) and past medical records. Nursing assessment for this office visit was reviewed with the patient for accuracy and revision.  Referring physician: Dr Loni Muse. [place]  Locations: In shower that did not have a grab bar then; Kitchen while walking, in grocery store Activity prior to fall: getting out of shower slipped fell back against faucet with her back, dresser.  Slipped while walking isle at grocery store going down on her knees with development of "knot" on right knee. Fell while walking across kitchen walking getting a right black eye from fall in [place] with forehead against kitchen counter edge. Falls frequently back onto bed while getting up from bed.   Dgt, Dgt's Husband, and dgt's son live with patient in patient's home.     Change in position prior to fall: yes Dizziness prior to fall:  yes Syncope prior to fall:  no Vertigo:  yes Chest Pain/Palpitations:  no Loss of balance:  yes Slip: yes Trip: yes Slide:  no What Body parts struck object(s) or ground: forehead, thoracic back, bilateral knee caps What object or surface was struck: floors, grocery isle, kitchen counter Injury: yes, black eye, "knot on right knee cap", bruising of back Associated acute Illness no Medications (new or dose changes):  yes, when takes multiple pills together TID Prior Work-up of fall or fall complications: no  Worry about falling: yes Pt needs help getting out chair and car    Vision Difficulty:  Yes 2 years since last eye exam, difference in seeing out of each eye Independent in ADLs: yes, but needs supervision with showering  Independent in I-ADLs: no.    Hx of problems with legs (strength, pain):  yes, knee osteoarthritis with replacemnt Hx of problems with feet: yes, left foot pain Hx of peripheral neuropathy: yes  Hx of arthritis:  Nonspecific arthritis  Hx of urgency  incontinence:  yes, stumbles Hx of problems with balance: yes Hx of problems with Gait: yes   Hx of Parkinsons Disease: no  Hx of Strokes: yes Hx of Cardiac Disease:  no Hx of Cogntive Impairment: yes  Hx problems with transfers:  yes Hx of WC dependence:  no  Hx of Vitamin D insufficiency: yes   Patient does fear falling or worries about falling She use a  Walker or cane She does hold onto furtniture with walking in home.  She does push up with her hands when standing up from chair She does trouble stepping up on curbs She does has to rush to the toilet to urinate  She has decreased  feeling in her feet  Taking multiple meds at one time makes her lightheaded or tired She does not take medications to fall asleep She no prolonged sadness or loss of pleasure in life She no difficulty seeing in day She yes difficulty seeing at night  Outpatient Encounter Prescriptions as of 07/24/2016  Medication Sig  . alendronate (FOSAMAX) 70 MG tablet TAKE 1 TABLET EVERY 7 DAYS  . Ascorbic Acid (VITAMIN C) 1000 MG tablet Take 1,000 mg by mouth daily.  Marland Kitchen aspirin EC 81 MG tablet Take 81 mg by mouth daily.  Marland Kitchen atorvastatin (LIPITOR) 10 MG tablet Take 1 tablet (10 mg total) by mouth daily.  . Cholecalciferol 50000 UNITS capsule Take 1 capsule (50,000 Units total) by mouth daily.  . cyclobenzaprine (FLEXERIL) 10 MG tablet Take 1 tablet (10 mg total) by mouth 3 (three)  times daily as needed for muscle spasms.  Marland Kitchen docusate sodium (COLACE) 100 MG capsule Take 100 mg by mouth daily.  . DULoxetine (CYMBALTA) 60 MG capsule Take 1 capsule (60 mg total) by mouth daily.  Marland Kitchen gabapentin (NEURONTIN) 800 MG tablet Take 1 tablet (800 mg total) by mouth 3 (three) times daily.  Marland Kitchen lisinopril (PRINIVIL,ZESTRIL) 10 MG tablet Take 1 tablet (10 mg total) by mouth daily.  . meclizine (ANTIVERT) 12.5 MG tablet Take 1 tablet (12.5 mg total) by mouth 3 (three) times daily as needed for dizziness.  . meloxicam (MOBIC) 15 MG  tablet Take 1 tablet (15 mg total) by mouth daily.  . metFORMIN (GLUCOPHAGE) 1000 MG tablet Take 1 tablet (1,000 mg total) by mouth 2 (two) times daily with a meal.  . omeprazole (PRILOSEC) 20 MG capsule Take 1 capsule (20 mg total) by mouth daily.  . ondansetron (ZOFRAN) 4 MG tablet TAKE ONE TABLET BY MOUTH EVERY 8 HOURS AS NEEDED FOR NAUSEA AND VOMITING  . propranolol (INDERAL) 60 MG tablet Take 1 tablet (60 mg total) by mouth 3 (three) times daily.  . Psyllium (METAMUCIL PO) Take 1 scoop by mouth daily.  Marland Kitchen triamcinolone cream (KENALOG) 0.5 % Apply 1 application topically 3 (three) times daily.     History Patient Active Problem List   Diagnosis Date Noted  . Falls 05/23/2016  . 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 Disorder of sacroiliac joint 05/15/2015  . Benign paroxysmal positional vertigo 01/24/2015  . Diabetes mellitus type 2, uncomplicated ([place]) 99991111  . Tremor of both hands 04/25/2014  . Vitamin D deficiency 07/11/2013  . Mood disorder ([place]) 07/11/2013  . Osteopenia 11/04/2012  . Heel pain 08/14/2011  . Atony of bladder 06/10/2010  . 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 Chronic pain syndrome 04/26/2010  . OBESITY, NOS 09/17/2006   Past Medical History:  Diagnosis Date  . Allergy   . Arthritis   . GERD (gastroesophageal reflux disease)   . Goiter   . Kidney stone   . Memory loss   . Osteoporosis    in lower back per pt   . Plantar fasciitis   . Stroke ([place])   . Tremors of nervous system   . Vertigo    Past Surgical History:  Procedure Laterality Date  . APPENDECTOMY    . BLADDER SURGERY    . CHOLECYSTECTOMY    . FOOT SURGERY    . TOTAL ABDOMINAL HYSTERECTOMY     Family History  Problem Relation Age of Onset  . Healthy Mother   . Heart disease Father   . Alcohol abuse Father   . Alcohol abuse Brother   . Colon polyps Neg Hx   . Colon cancer Neg Hx   . Esophageal cancer Neg Hx    . Rectal cancer Neg Hx   . Stomach cancer Neg Hx    Social History   Social History  . Marital status: Single    Spouse name: N/A  . Number of children: 4  . Years of education: HS   Occupational History  . Unemployed    Social History Main Topics  . Smoking status: Never Smoker  . Smokeless tobacco: Never Used  . Alcohol use No  . Drug use: No  . Sexual activity: Not on file   Other Topics Concern  . Not on file   Social History Narrative   Lives at home alone.   Right-handed.  Rarely uses caffeine.    Cardiovascular Risk Factors: CVA, Hypertension and Obesity  Personal History of Seizures: No -  Personal History of Stroke: Yes -  Personal History of Head Trauma: Yes - fall in kitchen where struck head        Health Maintenance reviewed: Immunization History  Administered Date(s) Administered  . Influenza,inj,Quad PF,36+ Mos 04/26/2015, 05/23/2016  . Pneumococcal Polysaccharide-23 01/24/2015  . Td 12/20/2002  . Tdap 01/24/2015   Health Maintenance Topics with due status: Overdue     Topic Date Due   OPHTHALMOLOGY EXAM 12/26/1966   HIV Screening 12/26/1971   MAMMOGRAM 06/04/2012       Vital Signs   There is no height or weight on file to calculate BMI. CrCl cannot be calculated (Patient's most recent lab result is older than the maximum 21 days allowed.). There is no height or weight on file to calculate BSA. There were no vitals filed for this visit. Wt Readings from Last 3 Encounters:  05/23/16 240 lb (108.9 kg)  03/14/16 241 lb (109.3 kg)  03/03/16 241 lb (109.3 kg)     Physical Examination:  VS reviewed GEN: Alert, Cooperative, Groomed, NAD HEENT: PERRL; EAC bilaterally not occluded, TM's translucent with normal LM, (+) LR;                No cervical LAN, No thyromegaly, No palpable masses COR: RRR, No M/G/R, No JVD, Normal PMI size and location LUNGS: BCTA, No Acc mm use, speaking in full sentences EXT: No peripheral leg edema.  Feet without deformity or lesions. Palpable bilateral pedal pulses.  SKIN: No lesion nor rashes of face/trunk/extremities Neuro: Oriented to person, place, and time; Strength: 5/5 Bil. UE and LE symmetric; Sensation: Intact grossly to touch all four extremities; Cerebellar: Finger-to-Nose intact, Rhomberg negative; Muscle Tone normal; Tremor nin right hand that is present at rest and in postural position with testing but not present when patient distracted   Sit-to-Stand Test: 4 / 30-seconds 4-Stage Stand Test:  Feet Side-by-side: Yes.       Feet Semi-tandem: Yes.       Feet Tandem: No.       Gait: No significant path deviation, Step-through present  Psych: Normal affect/thought/speech/language    MMSE - Mini Mental State Exam 03/29/2015 02/22/2015  Orientation to time 4 5  Orientation to Place 5 4  Registration 3 3  Attention/ Calculation 5 5  Recall 3 2  Language- name 2 objects 2 2  Language- repeat 1 1  Language- follow 3 step command 3 3  Language- read & follow direction 1 1  Write a sentence 1 1  Copy design 1 1  Total score 29 28          Labs  Lab Results  Component Value Date   VITAMINB12 415 02/22/2015    No results found for: FOLATE  Lab Results  Component Value Date   TSH 3.547 11/04/2012    No results found for: RPR    Chemistry      Component Value Date/Time   NA 137 10/21/2014 1320   K 3.7 10/21/2014 1320   CL 107 10/21/2014 1320   CO2 26 10/21/2014 1320   BUN 18 10/21/2014 1320   CREATININE 0.54 10/21/2014 1320   CREATININE 0.64 04/25/2014 0919      Component Value Date/Time   CALCIUM 9.0 10/21/2014 1320   ALKPHOS 88 09/30/2012 1652   AST 15 09/30/2012 1652   ALT 14 09/30/2012 1652  BILITOT 0.3 09/30/2012 1652       Lab Results  Component Value Date   HGBA1C 6.7 05/23/2016      Lab Results  Component Value Date   WBC 10.8 10/21/2014   HGB 13.2 10/21/2014   HCT 40.3 10/21/2014   MCV 97 10/21/2014   PLT 254 10/21/2014    No  results found for this or any previous visit (from the past 24 hour(s)).  Imaging   Brain MRI: February 23, 2015: periventricular small vessel disease, no evidence of old infarctions nor encephalomalacia.

## 2016-07-25 ENCOUNTER — Encounter: Payer: Self-pay | Admitting: Family Medicine

## 2016-07-25 DIAGNOSIS — N3941 Urge incontinence: Secondary | ICD-10-CM | POA: Insufficient documentation

## 2016-07-25 NOTE — Assessment & Plan Note (Signed)
Recommendations: Referred to [facility]/ Vestibular Rehab clinic for proximal muscle weakness of legs and impaired balance  and gait training Recommend stopping Meclizine as it is anticholinergic and potentially inappropriate medication on [place].  Recommend firm soled foot wear Supplement vitamin D 1000 IU daily Treat vision impairment: Patient recommended to see her ophthalmologist Modify the home environment: Dgt given CDC pamphlet on Safety Check for Falls in home.  She is working on removing clutter from home.  Patient's urgency incontinence may place her at risk when she is rushing to [place].  Recommend that patient been seen for Pelvic Muscle Rehab at [facility] at [facility].  Would wait until patient completes Balance Rehab before sending for Pelvic Rehab.. Can try trial of Mirabegron or solifenacin if inadequate response of urge incontinence to Pelvic Rehab.

## 2016-07-30 ENCOUNTER — Ambulatory Visit: Payer: Medicare HMO | Attending: Family Medicine | Admitting: Physical Therapy

## 2016-07-30 ENCOUNTER — Encounter: Payer: Self-pay | Admitting: Physical Therapy

## 2016-07-30 DIAGNOSIS — R2681 Unsteadiness on feet: Secondary | ICD-10-CM | POA: Insufficient documentation

## 2016-07-30 DIAGNOSIS — R296 Repeated falls: Secondary | ICD-10-CM | POA: Diagnosis present

## 2016-07-30 DIAGNOSIS — R2689 Other abnormalities of gait and mobility: Secondary | ICD-10-CM | POA: Diagnosis present

## 2016-07-30 DIAGNOSIS — M6281 Muscle weakness (generalized): Secondary | ICD-10-CM | POA: Diagnosis present

## 2016-07-30 DIAGNOSIS — R42 Dizziness and giddiness: Secondary | ICD-10-CM | POA: Insufficient documentation

## 2016-07-30 NOTE — Therapy (Signed)
[facility] [address] [place], [place], [address] Phone: [phone]   Fax:  [phone]  Physical Therapy Evaluation  Patient Details  Name: [name] MRN: [id number] Date of Birth: 1956/03/15 Referring Provider: Sherren Mocha McDiarmid, MD  Encounter Date: 07/30/2016      PT End of Session - 07/30/16 0910    Visit Number 1   Number of Visits 18   Date for PT Re-Evaluation 09/26/16   Authorization Type [employer] primary (G-code & progress note every 10 visits) & [employer] 2nd   PT Start Time 0804   PT Stop Time 0848   PT Time Calculation (min) 44 min   Activity Tolerance Patient tolerated treatment well;Patient limited by pain  onset of headache with testing   Behavior During Therapy [facility] for tasks assessed/performed      Past Medical History:  Diagnosis Date  . Allergy   . Arthritis   . GERD (gastroesophageal reflux disease)   . Goiter   . Kidney stone   . Memory loss   . Osteoporosis    in lower back per pt   . Plantar fasciitis   . Stroke ([place])   . Tremors of nervous system   . Vertigo     Past Surgical History:  Procedure Laterality Date  . APPENDECTOMY    . BLADDER SURGERY    . CHOLECYSTECTOMY    . FOOT SURGERY    . TOTAL ABDOMINAL HYSTERECTOMY      There were no vitals filed for this visit.       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 She reports ~8 months ago, she rolled over in her sleep and had severe dizziness & nausea. Since that episode she has had higher frequency of dizziness & balance issues with multiple falls. Patient was referred for PT evaluation to "get my marbles back in place."    Patient is accompained by: --  friend   Pertinent History BPPV, CVA, Lumbar spondylosis / DJD, arthritis (reports back, hips, knees & hands worst), DM2, osteoporosis, chronic pain syndrome, obesity    Limitations Standing;Walking;House hold activities   Patient Stated Goals To stop dizziness, go up steps safely, walking without falling including to daughter's 2 blocks away   Currently in Pain? Yes   Pain Score 5   worst 8-9/10, best 3/10   Pain Location Back   Pain Orientation Lower;Mid   Pain Descriptors / Indicators Sharp   Pain Type Chronic pain   Pain Onset More than a month ago   Pain Frequency Constant   Aggravating Factors  standing & walking   Pain Relieving Factors sitting with back support, meds   Effect of Pain on Daily Activities limits standing & walking   Multiple Pain Sites Yes   Pain Score 0  up stairs, 8/10   Pain Location Hip   Pain Orientation Left;Right   Pain Descriptors / Indicators Sharp   Pain Type Chronic pain   Pain Onset More than a month ago   Pain Frequency Intermittent   Aggravating Factors  go up stairs,    Pain Relieving Factors sitting down   Pain Score 2  ranges 0-5/10   Pain Location Knee   Pain Orientation Right;Left   Pain Descriptors / Indicators Aching   Pain Type Chronic pain   Pain Onset More than a month ago   Pain Frequency Intermittent  Aggravating Factors  staying in one position   Pain Relieving Factors sitting            [facility] PT Assessment - 07/30/16 0800      Assessment   Medical Diagnosis Falls, abnormality of mobility   Referring Provider Lissa Morales, MD   Onset Date/Surgical Date 07/24/16   Hand Dominance Right   Prior Therapy no     Precautions   Precautions Fall  no lifting over 10#     Balance Screen   Has the patient fallen in the past 6 months Yes   How many times? 6-7  bruises, dizziness with movements   Has the patient had a decrease in activity level because of a fear of falling?  Yes   Is the patient reluctant to leave their home because of a fear of falling?  Yes  has friends or family with her     [place] to enter   Entrance Stairs-Number of Steps 3   Entrance Stairs-Rails Right;Left;Cannot reach both   [place]land One level   [place] - 4 wheels;Cane - single point;Bedside commode;Shower seat;Grab bars - tub/shower     Prior Function   Level of Independence Independent;Independent with household mobility without device;Independent with community mobility without device;Independent with gait   Vocation On disability   Leisure go to park, yard Film/video editor   Posture/Postural Control Postural limitations   Postural Limitations Rounded Shoulders;Forward head;Flexed trunk     ROM / Strength   AROM / PROM / Strength AROM;Strength     AROM   Overall AROM  Within functional limits for tasks performed     Strength   Overall Strength Comments LUE & LLE gross testing seated 4/5     Ambulation/Gait   Ambulation/Gait Yes   Ambulation/Gait Assistance 5: Supervision   Ambulation/Gait Assistance Details got rollator walker for Christmas but does not know how to use it.    Ambulation Distance (Feet) 100 Feet   Assistive device None   Gait Pattern Scissoring;Step-through pattern;Decreased arm swing - left;Decreased step length - right;Decreased stance time - left;Decreased stride length;Left circumduction;Left foot flat;Right foot flat;Left flexed knee in stance;Lateral hip instability  LLE external rotation   Ambulation Surface Indoor;Level   Gait velocity 2.35 ft/sec  indicates limited community ambulator     Standardized Balance Assessment   Standardized Balance Assessment Berg Balance Test;Timed Up and Go Test     Berg Balance Test   Sit to Stand Able to stand  independently using hands   Standing Unsupported Able to stand safely 2 minutes   Sitting with Back Unsupported but Feet Supported on Floor or Stool Able to sit safely and securely 2 minutes   Stand to Sit Controls descent by using hands    Transfers Able to transfer safely, definite need of hands   Standing Unsupported with Eyes Closed Able to stand 3 seconds   Standing Ubsupported with Feet Together Able to place feet together independently but unable to hold for 30 seconds   From Standing, Reach Forward with Outstretched Arm Can reach forward >5 cm safely (2")   From Standing Position, Pick up Object from Floor Able to pick up shoe, needs supervision   From Standing Position, Turn to Look Behind Over each Shoulder Turn sideways only but maintains balance  Turn 360 Degrees Able to turn 360 degrees safely but slowly   Standing Unsupported, Alternately Place Feet on Step/Stool Able to complete >2 steps/needs minimal assist   Standing Unsupported, One Foot in Front Needs help to step but can hold 15 seconds   Standing on One Leg Tries to lift leg/unable to hold 3 seconds but remains standing independently   Total Score 33     Timed Up and Go Test   Normal TUG (seconds) 14.99   Cognitive TUG (seconds) 18.83  simple listing task 25.6% increase from std TUG            Vestibular Assessment - 07/30/16 0800      Vestibular Assessment   General Observation left eye lower than right eye at rest.      Symptom Behavior   Type of Dizziness Imbalance   Frequency of Dizziness 3-4 times per day   Duration of Dizziness 5 minutes   Aggravating Factors Looking up to the ceiling;Lying supine;Moving eyes;Turning body quickly;Turning head quickly;Walking in a crowd;Supine to sit;Sit to stand;Rolling to right;Rolling to left;Forward bending   Relieving Factors Rest;Avoiding busy/distracting areas     Occulomotor Exam   Occulomotor Alignment Abnormal   Spontaneous Absent   Gaze-induced Absent   Comment convergence left eye slow & decreased mvmt     Positional Testing   Sidelying Test Sidelying Right;Sidelying Left  Return to sit provokes "lightheaded" sensation     Sidelying Right   Sidelying Right Duration has mild dizziness  that remains in right sidelying and improves with eyes closed   Sidelying Right Symptoms No nystagmus     Sidelying Left   Sidelying Left Duration Reports mild subjective dizziness (worse on the right side) while in left sidelying   Sidelying Left Symptoms No nystagmus                         PT Short Term Goals - 07/30/16 [id number]      PT SHORT TERM GOAL #1   Title Patient demonstrates understanding of initial HEP. (Target Date: 08/29/2016)   Time 1   Period Months   Status New     PT SHORT TERM GOAL #2   Title Patient reports 25% improvement in dizziness with movements. (Target Date: 08/29/2016)   Time 1   Period Months   Status New     PT SHORT TERM GOAL #3   Title Timed Up-Go <13.5 seconds (Target Date: 08/29/2016)   Time 1   Period Months   Status New     PT SHORT TERM GOAL #4   Title Patient ambulates 300' with rollator walker safely including neg ramps & curbs modified independent. (Target Date: 08/29/2016)   Time 1   Period Months   Status New           PT Long Term Goals - 07/30/16 [id number]      PT LONG TERM GOAL #1   Title Patient demonstrates & verbalizes understanding of ongoing fitness plan / HEP. (Target Date: 09/26/2016)   Time 2   Period Months     PT LONG TERM GOAL #2   Title Patient reports 50% improvement in dizziness with movements. (Target Date: 09/26/2016)   Time 2   Period Months   Status New     PT LONG TERM GOAL #3   Title Cognitive TUG < 15 seconds with simple naming task to indicate lower fall risk. (Target Date: 09/26/2016)   Time 2  Period Months   Status New     PT LONG TERM GOAL #4   Title Berg Balance >45/56 to indicate lower fall risk. (Target Date: 09/26/2016)   Time 2   Period Months   Status New     PT LONG TERM GOAL #5   Title Patient ambulates with LRAD 500' outdoor surfaces including ramps, curbs & stairs (1 rail) modified independent for safe community mobility. (Target Date: 09/26/2016)   Time 2   Period Months    Status New               Plan - 06-Aug-2016 0912    Clinical Impression Statement This 61yo female was active at community level without falls or significant balance issues. She has increased dizziness, balance issues & decresased mobility causing increased deconditioning over the last 8 months limiting her ability for ADLs, household chores & community outings including shopping. She has weakness of LUE & LLE but unable to determine difference to strength prior to 8 month change. Her balance is impaired with Berg Balance score of 33/56 (<45/56 indicates fall risk) & Timed Up-Go 14.99sec (>13.5sec indicates fall risk). Also cognitive TUG of 18.83 sec (25.6% increase from std TUG) indicates fall risk especially when distracted. Initial vestibular screen indicates central issues with some peripheral involvement. Further testing to be performed at follow-up visit. Patient's condition is evolving & plan of care is moderate complexity. Pt would benefit from skilled PT to improve mobility & safety.    Rehab Potential Good   Clinical Impairments Affecting Rehab Potential BPPV, CVA, Lumbar spondylosis / DJD, arthritis (reports back, hips, knees & hands worst), DM2, osteoporosis, chronic pain syndrome, obesity   PT Frequency 2x / week   PT Duration Other (comment)  9 weeks (60 days)   PT Treatment/Interventions ADLs/Self Care Home Management;DME Instruction;Gait training;Stair training;Functional mobility training;Therapeutic activities;Therapeutic exercise;Balance training;Neuromuscular re-education;Patient/family education;Vestibular   PT Next Visit Plan assess vestibular system further, initiate HEP, instruct in safe use of rollator walker   Consulted and Agree with Plan of Care Patient      Patient will benefit from skilled therapeutic intervention in order to improve the following deficits and impairments:  Abnormal gait, Decreased activity tolerance, Decreased balance, Decreased endurance,  Decreased knowledge of precautions, Decreased knowledge of use of DME, Decreased mobility, Decreased strength, Dizziness, Postural dysfunction, Obesity, Pain  Visit Diagnosis: Dizziness and giddiness  Unsteadiness on feet  Other abnormalities of gait and mobility  Repeated falls  Muscle weakness (generalized)      G-Codes - 08-06-16 0929    Functional Assessment Tool Used Timed Up-Go 14.99sec & cognitive TUG with simple naming task 18.83sec   Functional Limitation Mobility: Walking and moving around   Mobility: Walking and Moving Around Current Status [phone]) At least 40 percent but less than 60 percent impaired, limited or restricted   Mobility: Walking and Moving Around Goal Status [phone]) At least 20 percent but less than 40 percent impaired, limited or restricted       Problem List Patient Active Problem List   Diagnosis Date Noted  . Urge incontinence 07/25/2016  . Impaired functional mobility, balance, and endurance 07/24/2016  . Falls 05/23/2016  . Warts 05/23/2016  . Chronic dental pain 02/22/2016  . Subacute frontal sinusitis 09/05/2015  . 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 Disorder of sacroiliac joint 05/15/2015  . Dyslipidemia associated with type 2 diabetes mellitus ([place]) 04/26/2015  . Benign paroxysmal positional vertigo 01/24/2015  . Diabetes mellitus  type 2, uncomplicated ([place]) [id number]  . Tremor of both hands 04/25/2014  . Eczema 09/20/2013  . Otalgia of right ear 09/20/2013  . Dental abscess 08/18/2013  . Vitamin D deficiency 07/11/2013  . Mood disorder ([place]) 07/11/2013  . Osteopenia 11/04/2012  . Heel pain 08/14/2011  . [place]y of bladder 06/10/2010  . Uterovaginal prolapse, incomplete 06/10/2010  . VAGINITIS, ATROPHIC 05/17/2010  . 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 Chronic pain syndrome 04/26/2010  . GOITER NOS 09/17/2006  . OBESITY, NOS 09/17/2006  . GASTROESOPHAGEAL  REFLUX, NO ESOPHAGITIS 09/17/2006    Jamey Reas PT, DPT 07/30/2016, 9:31 AM  [facility] [address] [place], [place], [address] Phone: [phone]   Fax:  [phone]  Name: [name] MRN: [id number] Date of Birth: 14-Mar-1956

## 2016-08-04 ENCOUNTER — Ambulatory Visit: Payer: Commercial Managed Care - HMO | Admitting: Physical Therapy

## 2016-08-06 ENCOUNTER — Ambulatory Visit: Payer: Medicare HMO | Admitting: Rehabilitative and Restorative Service Providers"

## 2016-08-07 ENCOUNTER — Ambulatory Visit: Payer: Commercial Managed Care - HMO | Admitting: Physical Therapy

## 2016-08-08 ENCOUNTER — Ambulatory Visit: Payer: Medicare HMO | Admitting: Rehabilitative and Restorative Service Providers"

## 2016-08-11 ENCOUNTER — Ambulatory Visit: Payer: Medicare HMO | Admitting: Physical Therapy

## 2016-08-11 ENCOUNTER — Encounter: Payer: Self-pay | Admitting: Physical Therapy

## 2016-08-11 DIAGNOSIS — M6281 Muscle weakness (generalized): Secondary | ICD-10-CM

## 2016-08-11 DIAGNOSIS — R2681 Unsteadiness on feet: Secondary | ICD-10-CM

## 2016-08-11 DIAGNOSIS — R42 Dizziness and giddiness: Secondary | ICD-10-CM

## 2016-08-11 DIAGNOSIS — R296 Repeated falls: Secondary | ICD-10-CM

## 2016-08-11 DIAGNOSIS — R2689 Other abnormalities of gait and mobility: Secondary | ICD-10-CM

## 2016-08-11 NOTE — Patient Instructions (Signed)
Fall Prevention in the Home Falls can cause injuries and can affect people from all age groups. There are many simple things that you can do to make your home safe and to help prevent falls. What can I do on the outside of my home?  Regularly repair the edges of walkways and driveways and fix any cracks.  Remove high doorway thresholds.  Trim any shrubbery on the main path into your home.  Use bright outdoor lighting.  Clear walkways of debris and clutter, including tools and rocks.  Regularly check that handrails are securely fastened and in good repair. Both sides of any steps should have handrails.  Install guardrails along the edges of any raised decks or porches.  Have leaves, snow, and ice cleared regularly.  Use sand or salt on walkways during winter months.  In the garage, clean up any spills right away, including grease or oil spills. What can I do in the bathroom?  Use night lights.  Install grab bars by the toilet and in the tub and shower. Do not use towel bars as grab bars.  Use non-skid mats or decals on the floor of the tub or shower.  If you need to sit down while you are in the shower, use a plastic, non-slip stool.  Keep the floor dry. Immediately clean up any water that spills on the floor.  Remove soap buildup in the tub or shower on a regular basis.  Attach bath mats securely with double-sided non-slip rug tape.  Remove throw rugs and other tripping hazards from the floor. What can I do in the bedroom?  Use night lights.  Make sure that a bedside light is easy to reach.  Do not use oversized bedding that drapes onto the floor.  Have a firm chair that has side arms to use for getting dressed.  Remove throw rugs and other tripping hazards from the floor. What can I do in the kitchen?  Clean up any spills right away.  Avoid walking on wet floors.  Place frequently used items in easy-to-reach places.  If you need to reach for something above  you, use a sturdy step stool that has a grab bar.  Keep electrical cables out of the way.  Do not use floor polish or wax that makes floors slippery. If you have to use wax, make sure that it is non-skid floor wax.  Remove throw rugs and other tripping hazards from the floor. What can I do in the stairways?  Do not leave any items on the stairs.  Make sure that there are handrails on both sides of the stairs. Fix handrails that are broken or loose. Make sure that handrails are as long as the stairways.  Check any carpeting to make sure that it is firmly attached to the stairs. Fix any carpet that is loose or worn.  Avoid having throw rugs at the top or bottom of stairways, or secure the rugs with carpet tape to prevent them from moving.  Make sure that you have a light switch at the top of the stairs and the bottom of the stairs. If you do not have them, have them installed. What are some other fall prevention tips?  Wear closed-toe shoes that fit well and support your feet. Wear shoes that have rubber soles or low heels.  When you use a stepladder, make sure that it is completely opened and that the sides are firmly locked. Have someone hold the ladder while you are using   it. Do not climb a closed stepladder.  Add color or contrast paint or tape to grab bars and handrails in your home. Place contrasting color strips on the first and last steps.  Use mobility aids as needed, such as canes, walkers, scooters, and crutches.  Turn on lights if it is dark. Replace any light bulbs that burn out.  Set up furniture so that there are clear paths. Keep the furniture in the same spot.  Fix any uneven floor surfaces.  Choose a carpet design that does not hide the edge of steps of a stairway.  Be aware of any and all pets.  Review your medicines with your healthcare provider. Some medicines can cause dizziness or changes in blood pressure, which increase your risk of falling. Talk with  your health care provider about other ways that you can decrease your risk of falls. This may include working with a physical therapist or trainer to improve your strength, balance, and endurance. This information is not intended to replace advice given to you by your health care provider. Make sure you discuss any questions you have with your health care provider. Document Released: 06/27/2002 Document Revised: 12/04/2015 Document Reviewed: 08/11/2014 Elsevier Interactive Patient Education  2017 [employer]  

## 2016-08-11 NOTE — Therapy (Signed)
[facility] [address] [place], [place], [address] Phone: [phone]   Fax:  [phone]  Physical Therapy Treatment  Patient Details  Name: [name] MRN: [id number] Date of Birth: 11/10/1955 Referring Provider: Sherren Mocha McDiarmid, MD  Encounter Date: 08/11/2016      PT End of Session - 08/11/16 1015    Visit Number 2   Number of Visits 18   Date for PT Re-Evaluation 09/26/16   Authorization Type [employer] Medicare primary (G-code & progress note every 10 visits) & [employer] 2nd   PT Start Time [phone]   PT Stop Time 0930   PT Time Calculation (min) 44 min   Activity Tolerance Patient tolerated treatment well;Patient limited by pain   Behavior During Therapy [facility] for tasks assessed/performed      Past Medical History:  Diagnosis Date  . Allergy   . Arthritis   . GERD (gastroesophageal reflux disease)   . Goiter   . Kidney stone   . Memory loss   . Osteoporosis    in lower back per pt   . Plantar fasciitis   . Stroke ([place])   . Tremors of nervous system   . Vertigo     Past Surgical History:  Procedure Laterality Date  . APPENDECTOMY    . BLADDER SURGERY    . CHOLECYSTECTOMY    . FOOT SURGERY    . TOTAL ABDOMINAL HYSTERECTOMY      There were no vitals filed for this visit.      Subjective Assessment - 08/11/16 0852    Subjective Pt reports fall this past week where she hit her night stand with bruising on RUE and breast.  Reports dizziness and nausea when first getting up out of bed followed by a headache; has to take medication to relieve headache.  Pt did not bring her rollator today but plans to bring on Thursday.   Pertinent History BPPV, CVA, Lumbar spondylosis / DJD, arthritis (reports back, hips, knees & hands worst), DM2, osteoporosis, chronic pain syndrome, obesity   Limitations Standing;Walking;House hold activities   Patient Stated Goals To stop dizziness, go up steps safely, walking without  falling including to daughter's 2 blocks away   Currently in Pain? No/denies                Vestibular Assessment - 08/11/16 0912      Vestibular Assessment   General Observation head tilt to L, L eye lower     Symptom Behavior   Type of Dizziness Lightheadedness  also reports diplopia   Frequency of Dizziness 3-4 times per day   Duration of Dizziness 5 minutes   Aggravating Factors Spontaneous onset;Lying supine;Moving eyes;Supine to sit;Sit to stand;Rolling to right;Rolling to left   Relieving Factors Medication;Rest     Occulomotor Exam   Occulomotor Alignment Abnormal   Spontaneous Absent   Gaze-induced Absent   Smooth Pursuits Intact  but reports feeling lightheaded   Saccades Slow;Poor trajectory  reports feeling lightheaded   Comment Convergence impaired     Vestibulo-Occular Reflex   VOR to Slow Head Movement Normal  but reports lightheadedness   VOR Cancellation Normal  but reports lightheadedness   Comment HIT: negative bilat  reports headache afterwards     Positional Testing   Sidelying Test Sidelying Right;Sidelying Left     Sidelying Right   Sidelying Right Duration mild lightheadedness and nausea   Sidelying Right Symptoms No nystagmus     Sidelying Left  Sidelying Left Duration mild lightheadedness, nausea and headache   Sidelying Left Symptoms No nystagmus     Positional Sensitivities   Sit to Supine Mild dizziness   Supine to Sitting Moderate dizziness   Rolling Right Mild dizziness   Rolling Left Mild dizziness     Orthostatics   Orthostatics Comment see vitals section                         PT Education - 08/11/16 1528    Education provided Yes   Education Details Educated on multi-factorial fall risk including weakness, orthostatic BP, urge incontinence, polypharmacy/side effects of medications.  Will continue to educate pt on safety recommendations at home due to continued repeated falls as well as education  on proper use of rollator.   Person(s) Educated Patient   Methods Explanation   Comprehension Verbalized understanding          PT Short Term Goals - 07/30/16 [id number]      PT SHORT TERM GOAL #1   Title Patient demonstrates understanding of initial HEP. (Target Date: 08/29/2016)   Time 1   Period Months   Status New     PT SHORT TERM GOAL #2   Title Patient reports 25% improvement in dizziness with movements. (Target Date: 08/29/2016)   Time 1   Period Months   Status New     PT SHORT TERM GOAL #3   Title Timed Up-Go <13.5 seconds (Target Date: 08/29/2016)   Time 1   Period Months   Status New     PT SHORT TERM GOAL #4   Title Patient ambulates 300' with rollator walker safely including neg ramps & curbs modified independent. (Target Date: 08/29/2016)   Time 1   Period Months   Status New           PT Long Term Goals - 07/30/16 [id number]      PT LONG TERM GOAL #1   Title Patient demonstrates & verbalizes understanding of ongoing fitness plan / HEP. (Target Date: 09/26/2016)   Time 2   Period Months     PT LONG TERM GOAL #2   Title Patient reports 50% improvement in dizziness with movements. (Target Date: 09/26/2016)   Time 2   Period Months   Status New     PT LONG TERM GOAL #3   Title Cognitive TUG < 15 seconds with simple naming task to indicate lower fall risk. (Target Date: 09/26/2016)   Time 2   Period Months   Status New     PT LONG TERM GOAL #4   Title Berg Balance >45/56 to indicate lower fall risk. (Target Date: 09/26/2016)   Time 2   Period Months   Status New     PT LONG TERM GOAL #5   Title Patient ambulates with LRAD 500' outdoor surfaces including ramps, curbs & stairs (1 rail) modified independent for safe community mobility. (Target Date: 09/26/2016)   Time 2   Period Months   Status New               Plan - 08/11/16 1646    Clinical Impression Statement Focus of treatment session on further assessment of vestibular system.  Pt signs and symptoms  are not consistent with peripheral or positional vestibular dysfunction.  Pt has 2-3 history of migraines with associated dizziness and nausea + light sensitivitiy.  Pt's symptoms today are consistent with sensitivity to head and eye motion and diplopia with  impaired convergence.  Pt also presents with multi-factorial falls risk including orthostasis, polypharmacy, urge incontinence, impaired vision, strength, balance, and dizziness.  PT to address motion sensitivity with habituation and will continue to provide pt with strengthening and balance training, training with AD and recommendations for home safety.  Pt may benefit from discussion with MD regarding possible medication side effects and recommendations.     Clinical Impairments Affecting Rehab Potential BPPV, CVA, Lumbar spondylosis / DJD, arthritis (reports back, hips, knees & hands worst), DM2, osteoporosis, chronic pain syndrome, obesity, h/o migraines with dizziness/nausea/light sensitivity   PT Treatment/Interventions ADLs/Self Care Home Management;DME Instruction;Gait training;Stair training;Functional mobility training;Therapeutic activities;Therapeutic exercise;Balance training;Neuromuscular re-education;Patient/family education;Vestibular   PT Next Visit Plan Continued to assess vestibular motion sensitivity; instruct in x 1 viewing for habituation.  Discuss OT referral for ADL, oculomotor training; rollator training   Consulted and Agree with Plan of Care Patient      Patient will benefit from skilled therapeutic intervention in order to improve the following deficits and impairments:  Abnormal gait, Decreased activity tolerance, Decreased balance, Decreased endurance, Decreased knowledge of precautions, Decreased knowledge of use of DME, Decreased mobility, Decreased strength, Dizziness, Postural dysfunction, Obesity, Pain  Visit Diagnosis: Dizziness and giddiness  Unsteadiness on feet  Other abnormalities of gait and  mobility  Repeated falls  Muscle weakness (generalized)     Problem List Patient Active Problem List   Diagnosis Date Noted  . Urge incontinence 07/25/2016  . Impaired functional mobility, balance, and endurance 07/24/2016  . Falls 05/23/2016  . Warts 05/23/2016  . Chronic dental pain 02/22/2016  . Subacute frontal sinusitis 09/05/2015  . 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 Disorder of sacroiliac joint 05/15/2015  . Dyslipidemia associated with type 2 diabetes mellitus ([place]) 04/26/2015  . Benign paroxysmal positional vertigo 01/24/2015  . Diabetes mellitus type 2, uncomplicated ([place]) [id number]  . Tremor of both hands 04/25/2014  . Eczema 09/20/2013  . Otalgia of right ear 09/20/2013  . Dental abscess 08/18/2013  . Vitamin D deficiency 07/11/2013  . Mood disorder ([place]) 07/11/2013  . Osteopenia 11/04/2012  . Heel pain 08/14/2011  . Atony of bladder 06/10/2010  . Uterovaginal prolapse, incomplete 06/10/2010  . VAGINITIS, ATROPHIC 05/17/2010  . 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 Chronic pain syndrome 04/26/2010  . GOITER NOS 09/17/2006  . OBESITY, NOS 09/17/2006  . GASTROESOPHAGEAL REFLUX, NO ESOPHAGITIS 09/17/2006   Raylene Everts, PT, DPT 08/11/16    4:57 PM   [facility] [address] [place], [place], [address] Phone: [phone]   Fax:  [phone]  Name: CERIAH GUIBORD MRN: [id number] Date of Birth: 15-Sep-1955

## 2016-08-14 ENCOUNTER — Encounter: Payer: Self-pay | Admitting: Physical Therapy

## 2016-08-14 ENCOUNTER — Ambulatory Visit: Payer: Medicare HMO | Admitting: Physical Therapy

## 2016-08-14 DIAGNOSIS — R2681 Unsteadiness on feet: Secondary | ICD-10-CM

## 2016-08-14 DIAGNOSIS — R42 Dizziness and giddiness: Secondary | ICD-10-CM

## 2016-08-14 DIAGNOSIS — M6281 Muscle weakness (generalized): Secondary | ICD-10-CM

## 2016-08-14 DIAGNOSIS — R296 Repeated falls: Secondary | ICD-10-CM

## 2016-08-14 DIAGNOSIS — R2689 Other abnormalities of gait and mobility: Secondary | ICD-10-CM

## 2016-08-14 NOTE — Patient Instructions (Signed)
Gaze Stabilization - Tip Card  1.Target must remain in focus, not blurry, and appear stationary while head is in motion. 2.Perform exercises with small head movements (45 to either side of midline). 3.Increase speed of head motion so long as target is in focus. 4.If you wear eyeglasses, be sure you can see target through lens (therapist will give specific instructions for bifocal / progressive lenses). 5.These exercises may provoke dizziness or nausea. Work through these symptoms. If too dizzy, slow head movement slightly. Rest between each exercise. 6.Exercises demand concentration; avoid distractions. 7.For safety, perform standing exercises close to a counter, wall, corner, or next to someone.  Copyright  VHI. All rights reserved.   Gaze Stabilization - Standing Feet Apart   Feet shoulder width apart, using chair in front for support, keeping eyes on target on wall 3 feet away, tilt head down slightly and move head side to side for 10--30 seconds. Repeat while moving head up and down for 10--30 seconds. *Work up to tolerating 60 seconds, as able.   Do not let symptoms go higher than a 4.  Do 2-3 sessions per day.   Copyright  VHI. All rights reserved.

## 2016-08-14 NOTE — Therapy (Signed)
[facility] [address] [place], [place], [address] Phone: [phone]   Fax:  [phone]  Physical Therapy Treatment  Patient Details  Name: [name] MRN: [id number] Date of Birth: May 09, 1956 Referring Provider: Sherren Mocha McDiarmid, MD  Encounter Date: 08/14/2016      PT End of Session - 08/14/16 0939    Visit Number 3   Number of Visits 18   Date for PT Re-Evaluation 09/26/16   Authorization Type [employer] Medicare primary (G-code & progress note every 10 visits) & [employer] 2nd   PT Start Time 0845   PT Stop Time 0933   PT Time Calculation (min) 48 min   Activity Tolerance Patient tolerated treatment well   Behavior During Therapy [facility] for tasks assessed/performed      Past Medical History:  Diagnosis Date  . Allergy   . Arthritis   . GERD (gastroesophageal reflux disease)   . Goiter   . Kidney stone   . Memory loss   . Osteoporosis    in lower back per pt   . Plantar fasciitis   . Stroke ([place])   . Tremors of nervous system   . Vertigo     Past Surgical History:  Procedure Laterality Date  . APPENDECTOMY    . BLADDER SURGERY    . CHOLECYSTECTOMY    . FOOT SURGERY    . TOTAL ABDOMINAL HYSTERECTOMY      There were no vitals filed for this visit.      Subjective Assessment - 08/14/16 0848    Subjective No falls since Monday; still reporting lightheadedness when first awaking.  Brought in rollator and cane from home to practice with.     Pertinent History BPPV, CVA, Lumbar spondylosis / DJD, arthritis (reports back, hips, knees & hands worst), DM2, osteoporosis, chronic pain syndrome, obesity   Limitations Standing;Walking;House hold activities   Currently in Pain? No/denies          [facility] Adult PT Treatment/Exercise - 08/14/16 0913      Ambulation/Gait   Ambulation/Gait Yes   Ambulation/Gait Assistance 5: Supervision   Ambulation Distance (Feet) 300 Feet   Assistive device 4-wheeled walker   Ambulation Surface Level;Unlevel;Indoor;Other (comment)  ramp and curb   Stairs Yes   Stairs Assistance 6: Modified independent (Device/Increase time)   Stair Management Technique One rail Right;Step to pattern;Forwards;With cane   Number of Stairs 4   Height of Stairs 6   Ramp 5: Supervision   Ramp Details (indicate cue type and reason) x 2 reps with 4 wheeled walker with verbal cues for safety and use of brakes to slow momentum   Curb 5: Supervision   Curb Details (indicate cue type and reason) x 2 reps with 4 wheeled walker with verbal cues for safety, sequence and use of brakes to stabilize   Gait Comments Also performed gait with 4 wheeled walker with 10 retro steps, vertical and horizontal head turns; practiced safe parking of 4 wheeled walker for seated rest break with supervision         Vestibular Treatment/Exercise - 08/14/16 0925      Vestibular Treatment/Exercise   Vestibular Treatment Provided Habituation   Habituation Exercises Standing Vertical Head Turns;Standing Horizontal Head Turns     Standing Horizontal Head Turns   Symptom Description  30 seconds with use of x 1 viewing for habituation with bilat UE support; 8/10     Standing Vertical Head Turns   Symptom Description  30 seconds with  use of x 1 viewing for habituation with bilat UE support, 8/10 and diplopia.            PT Education - 08/14/16 0855    Education provided Yes   Education Details Continued education on multi-factorial risk factors and continuing to discuss with MD medication side effects, orthostasis-possibility of use of compression stockings and other medical issues (sleep study).  Adjusted height of 4 wheeled walker and canes for home and community use.  Educated on safe, proper use of 4 wheeled walker during more dynamic gait and use of cane on stairs.  Educated on x 1 viewing for habituation HEP.   Person(s) Educated Patient   Methods Explanation;Demonstration;Handout   Comprehension  Verbalized understanding;Returned demonstration          PT Short Term Goals - 07/30/16 0922      PT SHORT TERM GOAL #1   Title Patient demonstrates understanding of initial HEP. (Target Date: 08/29/2016)   Time 1   Period Months   Status New     PT SHORT TERM GOAL #2   Title Patient reports 25% improvement in dizziness with movements. (Target Date: 08/29/2016)   Time 1   Period Months   Status New     PT SHORT TERM GOAL #3   Title Timed Up-Go <13.5 seconds (Target Date: 08/29/2016)   Time 1   Period Months   Status New     PT SHORT TERM GOAL #4   Title Patient ambulates 300' with rollator walker safely including neg ramps & curbs modified independent. (Target Date: 08/29/2016)   Time 1   Period Months   Status New           PT Long Term Goals - 07/30/16 [id number]      PT LONG TERM GOAL #1   Title Patient demonstrates & verbalizes understanding of ongoing fitness plan / HEP. (Target Date: 09/26/2016)   Time 2   Period Months     PT LONG TERM GOAL #2   Title Patient reports 50% improvement in dizziness with movements. (Target Date: 09/26/2016)   Time 2   Period Months   Status New     PT LONG TERM GOAL #3   Title Cognitive TUG < 15 seconds with simple naming task to indicate lower fall risk. (Target Date: 09/26/2016)   Time 2   Period Months   Status New     PT LONG TERM GOAL #4   Title Berg Balance >45/56 to indicate lower fall risk. (Target Date: 09/26/2016)   Time 2   Period Months   Status New     PT LONG TERM GOAL #5   Title Patient ambulates with LRAD 500' outdoor surfaces including ramps, curbs & stairs (1 rail) modified independent for safe community mobility. (Target Date: 09/26/2016)   Time 2   Period Months   Status New               Plan - 08/14/16 0940    Clinical Impression Statement Treatment session focus on safety/gait training with 4 wheeled walker and cane for home and community mobility to minimize falls risk.  Pt demonstrated safe Mod I use  of SPC for stair negotiation for home entry/exit when going to get mail.  Required supervision and verbal cues for safety/sequencing with 4 wheeled walker and continued to demonstrate some imbalance with head turns during gait.  Educated on use of x 1 viewing in standing for habituation and to stabilize gaze during head  movement.  Tolerated well but did report 8/10 dizziness and headache after performing 30 seconds each; advised to start with 10 seconds, to monitor symptoms and cease if symptoms >4/10.  Will continue to address and will continue to assess need for OT referral for visual deficits.   Clinical Impairments Affecting Rehab Potential BPPV, CVA, Lumbar spondylosis / DJD, arthritis (reports back, hips, knees & hands worst), DM2, osteoporosis, chronic pain syndrome, obesity, h/o migraines with dizziness/nausea/light sensitivity   PT Treatment/Interventions ADLs/Self Care Home Management;DME Instruction;Gait training;Stair training;Functional mobility training;Therapeutic activities;Therapeutic exercise;Balance training;Neuromuscular re-education;Patient/family education;Vestibular   PT Next Visit Plan review x 1 viewing HEP, addition of standing balance and proximal mm strengthening to HEP   Consulted and Agree with Plan of Care Patient      Patient will benefit from skilled therapeutic intervention in order to improve the following deficits and impairments:  Abnormal gait, Decreased activity tolerance, Decreased balance, Decreased endurance, Decreased knowledge of precautions, Decreased knowledge of use of DME, Decreased mobility, Decreased strength, Dizziness, Postural dysfunction, Obesity, Pain  Visit Diagnosis: Dizziness and giddiness  Unsteadiness on feet  Other abnormalities of gait and mobility  Repeated falls  Muscle weakness (generalized)     Problem List Patient Active Problem List   Diagnosis Date Noted  . Urge incontinence 07/25/2016  . Impaired functional mobility,  balance, and endurance 07/24/2016  . Falls 05/23/2016  . Warts 05/23/2016  . Chronic dental pain 02/22/2016  . Subacute frontal sinusitis 09/05/2015  . 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 Disorder of sacroiliac joint 05/15/2015  . Dyslipidemia associated with type 2 diabetes mellitus ([place]) 04/26/2015  . Benign paroxysmal positional vertigo 01/24/2015  . Diabetes mellitus type 2, uncomplicated ([place]) [id number]  . Tremor of both hands 04/25/2014  . Eczema 09/20/2013  . Otalgia of right ear 09/20/2013  . Dental abscess 08/18/2013  . Vitamin D deficiency 07/11/2013  . Mood disorder ([place]) 07/11/2013  . Osteopenia 11/04/2012  . Heel pain 08/14/2011  . Atony of bladder 06/10/2010  . Uterovaginal prolapse, incomplete 06/10/2010  . VAGINITIS, ATROPHIC 05/17/2010  . 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 Chronic pain syndrome 04/26/2010  . GOITER NOS 09/17/2006  . OBESITY, NOS 09/17/2006  . GASTROESOPHAGEAL REFLUX, NO ESOPHAGITIS 09/17/2006   Raylene Everts, PT, DPT 08/14/16    9:48 AM    [facility] [address] [place], [place], [address] Phone: [phone]   Fax:  [phone]  Name: [name] MRN: [id number] Date of Birth: April 23, 1956

## 2016-08-18 ENCOUNTER — Telehealth: Payer: Self-pay | Admitting: Rehabilitative and Restorative Service Providers"

## 2016-08-18 ENCOUNTER — Ambulatory Visit: Payer: Medicare HMO | Admitting: Rehabilitative and Restorative Service Providers"

## 2016-08-18 DIAGNOSIS — R2681 Unsteadiness on feet: Secondary | ICD-10-CM

## 2016-08-18 DIAGNOSIS — R42 Dizziness and giddiness: Secondary | ICD-10-CM

## 2016-08-18 DIAGNOSIS — R296 Repeated falls: Secondary | ICD-10-CM

## 2016-08-18 DIAGNOSIS — R2689 Other abnormalities of gait and mobility: Secondary | ICD-10-CM

## 2016-08-18 DIAGNOSIS — M6281 Muscle weakness (generalized): Secondary | ICD-10-CM

## 2016-08-18 NOTE — Telephone Encounter (Addendum)
Ms. [name] was seen for PT visit today.  She continues to have lightheadedness and "whoozy" sensation when moving into standing.  She has positive orthostatic BP measurements today (see PT note).   Would she benefit from compression stockings?  Please advise if you would recommend.  Thanks, Rudell Cobb, [place]

## 2016-08-18 NOTE — Patient Instructions (Signed)
Weight Shift: Anterior / Posterior (Righting / Equilibrium)    Slowly shift weight forward, arms back and hips forward over toes (DO NOT HAVE HEELS RAISE OFF FLOOR).  Shift weight backward, arms forward and hips back over heels (BUMPING THE WALL BEHIND YOU). Hold each position __5__ seconds. Repeat __10__ times per session. Do ___2_ sessions per day.   Ankle Plantar Flexion / Dorsiflexion, Standing    Stand while holding a stable object. Rise up on toes. Then rock back on heels. Hold each position 3 seconds. Repeat 10 times per session. Do 2 sessions per day.  Copyright  VHI. All rights reserved.    AMBULATION: Side Step    Step sideways. Repeat in opposite direction.  HOLD ONTO COUNTERTOP FOR SUPPORT.  8 STEPS TO THE RIGHT AND THEN 8 STEPS TO THE LEFT.  Do 2 x/day.  Copyright  VHI. All rights reserved.   Seated Hamstring Stretch    1. Sit in a chair with the involved leg in another chair.  2. Keep your knee straight and lean forward until you feel a stretch in the back of your leg.  3. Hold the stretch for about 20 seconds. Repeat 2 times 2 times per day.   Calf Stretch on Wall    Start by standing in front of a wall or other sturdy object. Step forward with one foot and keep your toes on both feet pointed straight forward. Keep the leg behind you with a straight knee during the stretch.   Lean forward towards the wall and support yourself with your arms as you allow your front knee to bend until a gentle stretch is felt along the back of your leg that is most behind you.   Move closer or further away from the wall to control the stretch of the back leg. Also you can adjust the bend of the front knee to control the stretch as well.   Hold 20 seconds. Perform 2 times per set 2 sessions per day.

## 2016-08-18 NOTE — Therapy (Signed)
[facility] [address] [place], [place], [address] Phone: [phone]   Fax:  [phone]  Physical Therapy Treatment  Patient Details  Name: [name] MRN: [id number] Date of Birth: 02-08-1956 Referring Provider: Sherren Mocha McDiarmid, MD  Encounter Date: 08/18/2016      PT End of Session - 08/18/16 1028    Visit Number 4   Number of Visits 18   Date for PT Re-Evaluation 09/26/16   Authorization Type [employer] Medicare primary (G-code & progress note every 10 visits) & BCBS 2nd   PT Start Time 0850   PT Stop Time 0934   PT Time Calculation (min) 44 min   Equipment Utilized During Treatment Gait belt   Activity Tolerance Patient tolerated treatment well   Behavior During Therapy [facility] for tasks assessed/performed      Past Medical History:  Diagnosis Date  . Allergy   . Arthritis   . GERD (gastroesophageal reflux disease)   . Goiter   . Kidney stone   . Memory loss   . Osteoporosis    in lower back per pt   . Plantar fasciitis   . Stroke ([place])   . Tremors of nervous system   . Vertigo     Past Surgical History:  Procedure Laterality Date  . APPENDECTOMY    . BLADDER SURGERY    . CHOLECYSTECTOMY    . FOOT SURGERY    . TOTAL ABDOMINAL HYSTERECTOMY      There were no vitals filed for this visit.      Subjective Assessment - 08/18/16 0852    Subjective The patient no falls since last visit.  The patient is not dizzy at this time.    PT inquired about blood sugar and it is 237 this morning.  The patient does not note dizziness getting into bed, but reports it when getting out of bed.    Pertinent History BPPV, CVA, Lumbar spondylosis / DJD, arthritis (reports back, hips, knees & hands worst), DM2, osteoporosis, chronic pain syndrome, obesity   Patient Stated Goals To stop dizziness, go up steps safely, walking without falling including to daughter's 2 blocks away   Currently in Pain? Yes   Pain Score 4     Pain Location Head   Pain Orientation --  frontal and into temples   Pain Descriptors / Indicators Headache   Pain Type Acute pain   Pain Onset More than a month ago   Pain Frequency Intermittent   Aggravating Factors  notes headache daily and worse with moving around   Pain Relieving Factors rest, meds                Vestibular Assessment - 08/18/16 0855      Vestibular Assessment   General Observation Patient walks in without device today indep on level surfaces with slowed pace.  She notes headache at rest.      Orthostatics   BP supine (x 5 minutes) 120/66  L arm   HR supine (x 5 minutes) 72   BP standing (after 1 minute) 98/57  The patient reports 7/10 lightheadedness-described "whoozy"   HR standing (after 1 minute) 73   BP standing (after 3 minutes) 101/57   HR standing (after 3 minutes) 70   Orthostatics Comment The patient also notes increasing headache during orthostatic measurements.  She deies true sensations of spinning and reports more of a lightheaded, whoozy sensation that leads to imbalance/falls after rising in her home.  [facility] Adult PT Treatment/Exercise - 08/18/16 0910      Ambulation/Gait   Ambulation/Gait Yes   Ambulation/Gait Assistance 5: Supervision   Ambulation Distance (Feet) 200 Feet   Assistive device None   Gait Pattern Scissoring;Step-through pattern;Decreased arm swing - left;Decreased step length - right;Decreased stance time - left;Decreased stride length;Left circumduction;Left foot flat;Right foot flat;Left flexed knee in stance;Lateral hip instability;Narrow base of support   Ambulation Surface Level   Gait Comments Provided cues for patient to emphasize wider base of support for improved balance.  *used sidestepping for HEP to help improve hip stability for gait activities.      High Level Balance   High Level Balance Activities Side stepping   High Level Balance Comments sidestepping at countertop  with UE support x 10 feet x 6 reps.   Wall bumps for anterior/posterior weight shifting with eyes open and eyes closed (requires min A due to loss of balance with eyes closed).       Self-Care   Self-Care Other Self-Care Comments   Other Self-Care Comments  Steadi took handout on postural hypotension with emphasis on hydration, avoiding hot showers, moving slowly (especially in the morning) and sitting before standing.  Discussed prevention of falls by holding onto nightstand when getting up and avoiding walking until lightheadedness settles.       Neuro Re-ed    Neuro Re-ed Details  VOR x 1 reviewed with cues to avoid stopping head motion in middle.  Performed with tactile cues on speed/amplitude of head motion and verbal cues to maintain gaze fixation.      Exercises   Exercises Knee/Hip;Ankle     Knee/Hip Exercises: Stretches   Active Hamstring Stretch Right;Left;20 seconds;2 reps   Active Hamstring Stretch Limitations Patient has foot ER and needs tactile cues for positioning.      Ankle Exercises: Stretches   Gastroc Stretch 2 reps;20 seconds   Gastroc Stretch Limitations cues for foot placement to avoid ER     Ankle Exercises: Standing   Heel Raises 10 reps   Toe Raise 10 reps   Toe Raise Limitations Performed with fingertip touch at countertop.  Patient c/o fatigue after 10 reps for both heel and toe raises.                 PT Education - 08/18/16 0928    Education provided Yes   Education Details HEP: added heel cord stretch/hamstring stretch, heel/toe raises, wall bumps, side stepping.  Educated using [employer] tool[employer] postural hypotension handout from [employer].  Discussed PT plan to notify MD regarding BP measurements.    Person(s) Educated Patient   Methods Explanation;Demonstration;Handout   Comprehension Verbalized understanding;Returned demonstration;Verbal cues required;Need further instruction          PT Short Term Goals - 07/30/16 [id number]      PT SHORT TERM GOAL  #1   Title Patient demonstrates understanding of initial HEP. (Target Date: 08/29/2016)   Time 1   Period Months   Status New     PT SHORT TERM GOAL #2   Title Patient reports 25% improvement in dizziness with movements. (Target Date: 08/29/2016)   Time 1   Period Months   Status New     PT SHORT TERM GOAL #3   Title Timed Up-Go <13.5 seconds (Target Date: 08/29/2016)   Time 1   Period Months   Status New     PT SHORT TERM GOAL #4   Title Patient ambulates 300' with rollator walker safely including  neg ramps & curbs modified independent. (Target Date: 08/29/2016)   Time 1   Period Months   Status New           PT Long Term Goals - 07/30/16 [id number]      PT LONG TERM GOAL #1   Title Patient demonstrates & verbalizes understanding of ongoing fitness plan / HEP. (Target Date: 09/26/2016)   Time 2   Period Months     PT LONG TERM GOAL #2   Title Patient reports 50% improvement in dizziness with movements. (Target Date: 09/26/2016)   Time 2   Period Months   Status New     PT LONG TERM GOAL #3   Title Cognitive TUG < 15 seconds with simple naming task to indicate lower fall risk. (Target Date: 09/26/2016)   Time 2   Period Months   Status New     PT LONG TERM GOAL #4   Title Berg Balance >45/56 to indicate lower fall risk. (Target Date: 09/26/2016)   Time 2   Period Months   Status New     PT LONG TERM GOAL #5   Title Patient ambulates with LRAD 500' outdoor surfaces including ramps, curbs & stairs (1 rail) modified independent for safe community mobility. (Target Date: 09/26/2016)   Time 2   Period Months   Status New               Plan - 08/18/16 1029    Clinical Impression Statement The patient arrived today without assistive device for ambulation.  She has significant change in blood pressure during orthostatic BP measurements and may benefit from compression stockings if MD agrees.  Patient has multiple factors contributing to clinical presentation with positive  postural hypotension testing, h/o chronic back pain, dec'd control of blood sugar, h/o CVA, and multiple falls.  PT added LE strengthening/stretching activities to home -- patient was able to perform safely in the clinic today.     PT Treatment/Interventions ADLs/Self Care Home Management;DME Instruction;Gait training;Stair training;Functional mobility training;Therapeutic activities;Therapeutic exercise;Balance training;Neuromuscular re-education;Patient/family education;Vestibular   PT Next Visit Plan Review HEP provided and progress LE strengthening and mobility to patient tolerance.  Monitor vitals as indicated.     Consulted and Agree with Plan of Care Patient      Patient will benefit from skilled therapeutic intervention in order to improve the following deficits and impairments:  Abnormal gait, Decreased activity tolerance, Decreased balance, Decreased endurance, Decreased knowledge of precautions, Decreased knowledge of use of DME, Decreased mobility, Decreased strength, Dizziness, Postural dysfunction, Obesity, Pain  Visit Diagnosis: Dizziness and giddiness  Unsteadiness on feet  Other abnormalities of gait and mobility  Repeated falls  Muscle weakness (generalized)     Problem List Patient Active Problem List   Diagnosis Date Noted  . Urge incontinence 07/25/2016  . Impaired functional mobility, balance, and endurance 07/24/2016  . Falls 05/23/2016  . Warts 05/23/2016  . Chronic dental pain 02/22/2016  . Subacute frontal sinusitis 09/05/2015  . 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 Disorder of sacroiliac joint 05/15/2015  . Dyslipidemia associated with type 2 diabetes mellitus ([place]) 04/26/2015  . Benign paroxysmal positional vertigo 01/24/2015  . Diabetes mellitus type 2, uncomplicated ([place]) 99991111  . Tremor of both hands 04/25/2014  . Eczema 09/20/2013  . Otalgia of right ear 09/20/2013  . Dental abscess 08/18/2013  .  Vitamin D deficiency 07/11/2013  . Mood disorder ([place]) 07/11/2013  . Osteopenia 11/04/2012  . Heel pain  08/14/2011  . Atony of bladder 06/10/2010  . Uterovaginal prolapse, incomplete 06/10/2010  . VAGINITIS, ATROPHIC 05/17/2010  . 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 Chronic pain syndrome 04/26/2010  . GOITER NOS 09/17/2006  . OBESITY, NOS 09/17/2006  . GASTROESOPHAGEAL REFLUX, NO ESOPHAGITIS 09/17/2006    Donato Studley, PT 08/18/2016, 10:38 AM  [facility] [address] [place], [place], [address] Phone: [phone]   Fax:  [phone]  Name: [name] MRN: [id number] Date of Birth: 07-17-1956

## 2016-08-19 NOTE — Telephone Encounter (Signed)
If the patient has any pitting edema in her legs or feet she may benefit from compression stockings. Otherwise I don't think they will be very helpful. Thanks!   Adin Hector, MD, MPH PGY-2 [facility] Pager [phone]

## 2016-08-21 ENCOUNTER — Ambulatory Visit: Payer: Medicare HMO | Attending: Family Medicine | Admitting: Rehabilitative and Restorative Service Providers"

## 2016-08-21 DIAGNOSIS — R2689 Other abnormalities of gait and mobility: Secondary | ICD-10-CM | POA: Diagnosis present

## 2016-08-21 DIAGNOSIS — M6281 Muscle weakness (generalized): Secondary | ICD-10-CM | POA: Diagnosis present

## 2016-08-21 DIAGNOSIS — R2681 Unsteadiness on feet: Secondary | ICD-10-CM

## 2016-08-21 DIAGNOSIS — R296 Repeated falls: Secondary | ICD-10-CM | POA: Diagnosis present

## 2016-08-21 DIAGNOSIS — R42 Dizziness and giddiness: Secondary | ICD-10-CM | POA: Diagnosis present

## 2016-08-21 NOTE — Therapy (Signed)
[facility] [address] [place], [place], [address] Phone: [phone]   Fax:  [phone]  Physical Therapy Treatment  Patient Details  Name: [name] MRN: [id number] Date of Birth: 01-May-1956 Referring Provider: Sherren Mocha McDiarmid, MD  Encounter Date: 08/21/2016      PT End of Session - 08/21/16 0927    Visit Number 5   Number of Visits 18   Date for PT Re-Evaluation 09/26/16   Authorization Type [employer] Medicare primary (G-code & progress note every 10 visits) & BCBS 2nd   PT Start Time 0845   PT Stop Time 0930   PT Time Calculation (min) 45 min   Equipment Utilized During Treatment Gait belt   Activity Tolerance Patient tolerated treatment well   Behavior During Therapy [facility] for tasks assessed/performed      Past Medical History:  Diagnosis Date  . Allergy   . Arthritis   . GERD (gastroesophageal reflux disease)   . Goiter   . Kidney stone   . Memory loss   . Osteoporosis    in lower back per pt   . Plantar fasciitis   . Stroke ([place])   . Tremors of nervous system   . Vertigo     Past Surgical History:  Procedure Laterality Date  . APPENDECTOMY    . BLADDER SURGERY    . CHOLECYSTECTOMY    . FOOT SURGERY    . TOTAL ABDOMINAL HYSTERECTOMY      There were no vitals filed for this visit.      Subjective Assessment - 08/21/16 0852    Subjective The patient notes she tried exercises, but noticed increased cramping in her legs.  She feels exercise made muscles cramp more.   The patient notes headache at rest, not dizzy, but feeling unsteady.  She walks into clinic with large based quad cane used incorrectly with occasional tripping on legs of device.     Pertinent History BPPV, CVA, Lumbar spondylosis / DJD, arthritis (reports back, hips, knees & hands worst), DM2, osteoporosis, chronic pain syndrome, obesity   Patient Stated Goals To stop dizziness, go up steps safely, walking without falling  including to daughter's 2 blocks away   Currently in Pain? Yes   Pain Score 5    Pain Location Head  and low back "that always hurts me"   Pain Descriptors / Indicators Headache   Pain Type Chronic pain   Pain Onset More than a month ago   Pain Frequency Intermittent   Aggravating Factors  notes headache daily   Pain Relieving Factors rest and meds                         [facility] Adult PT Treatment/Exercise - 08/21/16 0854      Ambulation/Gait   Ambulation/Gait Yes   Ambulation/Gait Assistance 4: Min guard   Ambulation Distance (Feet) 345 Feet  230 feet   Assistive device None   Gait Pattern Scissoring;Step-through pattern;Decreased arm swing - left;Decreased step length - right;Decreased stance time - left;Decreased stride length;Left circumduction;Left foot flat;Right foot flat;Left flexed knee in stance;Lateral hip instability;Narrow base of support   Ambulation Surface Level   Gait Comments Patient c/o low back pain with gait.  She has ER of R hip and narrow base of support leading to intermittent tripping on R foot.      High Level Balance   High Level Balance Activities Side stepping;Marching forwards   High Level  Balance Comments With CGA to min A emphasizing trunk stability.      Self-Care   Self-Care Other Self-Care Comments   Other Self-Care Comments  Discussed changing assistive device use each day (we have seen patient with no device, with quad cane and with rollater).  Discussed the benefit of consistency due to fall risk to improve mobility.  Recommended patient stay between rollater and possibly SPC (if PT can ensure safe over time).  Also discussed muscle soreness and educated patient that we expect mild soreness (different from pain) for first couple of weeks of therapy.  Educated on need to remain compliant to improve mobility.       Neuro Re-ed    Neuro Re-ed Details  Seated physioball marching with core contraction x 4 reps each, seated reaching  and posture re-ed on physioball for awareness.       Exercises   Exercises Neck;Shoulder;Lumbar;Other Exercises   Other Exercises  Patient performed trunk flexion/extension at countertop; trunk rotation leaning on countertop within tolerable range of motion.      Neck Exercises: Seated   Shoulder Rolls 10 reps   Other Seated Exercise seated shoulder abduction with red band for scapular retraction/depression x 10 reps.      Lumbar Exercises: Machines for Strengthening   Other Lumbar Machine Exercise ci-fit x 5 minutes with UE/LEs for endurance and strengthening.  Patient stopped using UEs after 3 minutes due to arm fatigue.                   PT Short Term Goals - 07/30/16 [id number]      PT SHORT TERM GOAL #1   Title Patient demonstrates understanding of initial HEP. (Target Date: 08/29/2016)   Time 1   Period Months   Status New     PT SHORT TERM GOAL #2   Title Patient reports 25% improvement in dizziness with movements. (Target Date: 08/29/2016)   Time 1   Period Months   Status New     PT SHORT TERM GOAL #3   Title Timed Up-Go <13.5 seconds (Target Date: 08/29/2016)   Time 1   Period Months   Status New     PT SHORT TERM GOAL #4   Title Patient ambulates 300' with rollator walker safely including neg ramps & curbs modified independent. (Target Date: 08/29/2016)   Time 1   Period Months   Status New           PT Long Term Goals - 07/30/16 [id number]      PT LONG TERM GOAL #1   Title Patient demonstrates & verbalizes understanding of ongoing fitness plan / HEP. (Target Date: 09/26/2016)   Time 2   Period Months     PT LONG TERM GOAL #2   Title Patient reports 50% improvement in dizziness with movements. (Target Date: 09/26/2016)   Time 2   Period Months   Status New     PT LONG TERM GOAL #3   Title Cognitive TUG < 15 seconds with simple naming task to indicate lower fall risk. (Target Date: 09/26/2016)   Time 2   Period Months   Status New     PT LONG TERM GOAL #4    Title Berg Balance >45/56 to indicate lower fall risk. (Target Date: 09/26/2016)   Time 2   Period Months   Status New     PT LONG TERM GOAL #5   Title Patient ambulates with LRAD 500' outdoor surfaces including ramps, curbs &  stairs (1 rail) modified independent for safe community mobility. (Target Date: 09/26/2016)   Time 2   Period Months   Status New               Plan - 08/21/16 1201    Clinical Impression Statement The patient is tolerating increased activities/exercise today in therapy.  She c/o lightheadedness at times, however no reports of true vertigo sensations.  PT emphasizing strengthening, balance activities to improve functional mobility.    Clinical Impairments Affecting Rehab Potential BPPV, CVA, Lumbar spondylosis / DJD, arthritis (reports back, hips, knees & hands worst), DM2, osteoporosis, chronic pain syndrome, obesity, h/o migraines with dizziness/nausea/light sensitivity   PT Treatment/Interventions ADLs/Self Care Home Management;DME Instruction;Gait training;Stair training;Functional mobility training;Therapeutic activities;Therapeutic exercise;Balance training;Neuromuscular re-education;Patient/family education;Vestibular   PT Next Visit Plan Monitor vitals as indicated; LE strengthening, balance, gait, endurance for improving mobility.  Discuss safe use of devices.   Consulted and Agree with Plan of Care Patient      Patient will benefit from skilled therapeutic intervention in order to improve the following deficits and impairments:  Abnormal gait, Decreased activity tolerance, Decreased balance, Decreased endurance, Decreased knowledge of precautions, Decreased knowledge of use of DME, Decreased mobility, Decreased strength, Dizziness, Postural dysfunction, Obesity, Pain  Visit Diagnosis: Dizziness and giddiness  Unsteadiness on feet  Other abnormalities of gait and mobility  Muscle weakness (generalized)     Problem List Patient Active Problem  List   Diagnosis Date Noted  . Urge incontinence 07/25/2016  . Impaired functional mobility, balance, and endurance 07/24/2016  . Falls 05/23/2016  . Warts 05/23/2016  . Chronic dental pain 02/22/2016  . Subacute frontal sinusitis 09/05/2015  . 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 Disorder of sacroiliac joint 05/15/2015  . Dyslipidemia associated with type 2 diabetes mellitus ([place]) 04/26/2015  . Benign paroxysmal positional vertigo 01/24/2015  . Diabetes mellitus type 2, uncomplicated ([place]) 99991111  . Tremor of both hands 04/25/2014  . Eczema 09/20/2013  . Otalgia of right ear 09/20/2013  . Dental abscess 08/18/2013  . Vitamin D deficiency 07/11/2013  . Mood disorder ([place]) 07/11/2013  . Osteopenia 11/04/2012  . Heel pain 08/14/2011  . Atony of bladder 06/10/2010  . Uterovaginal prolapse, incomplete 06/10/2010  . VAGINITIS, ATROPHIC 05/17/2010  . 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 Chronic pain syndrome 04/26/2010  . GOITER NOS 09/17/2006  . OBESITY, NOS 09/17/2006  . GASTROESOPHAGEAL REFLUX, NO ESOPHAGITIS 09/17/2006    Breeze Berringer, PT 08/21/2016, 12:09 PM  [facility] [address] [place], [place], [address] Phone: [phone]   Fax:  [phone]  Name: ZERLINA FLAMER MRN: [id number] Date of Birth: 10/28/1955

## 2016-08-25 ENCOUNTER — Ambulatory Visit: Payer: Medicare HMO | Admitting: Rehabilitative and Restorative Service Providers"

## 2016-08-28 ENCOUNTER — Ambulatory Visit: Payer: Medicare HMO | Admitting: Physical Therapy

## 2016-08-28 ENCOUNTER — Encounter: Payer: Self-pay | Admitting: Physical Therapy

## 2016-08-28 DIAGNOSIS — R2681 Unsteadiness on feet: Secondary | ICD-10-CM

## 2016-08-28 DIAGNOSIS — R296 Repeated falls: Secondary | ICD-10-CM

## 2016-08-28 DIAGNOSIS — R2689 Other abnormalities of gait and mobility: Secondary | ICD-10-CM

## 2016-08-28 DIAGNOSIS — R42 Dizziness and giddiness: Secondary | ICD-10-CM | POA: Diagnosis not present

## 2016-08-28 DIAGNOSIS — M6281 Muscle weakness (generalized): Secondary | ICD-10-CM

## 2016-08-28 NOTE — Therapy (Signed)
[facility] [address] [place], [place], [address] Phone: [phone]   Fax:  [phone]  Physical Therapy Treatment  Patient Details  Name: [name] MRN: [id number] Date of Birth: 10-30-1955 Referring Provider: Sherren Mocha McDiarmid, MD  Encounter Date: 08/28/2016      PT End of Session - 08/28/16 1041    Visit Number 6   Number of Visits 18   Date for PT Re-Evaluation 09/26/16   Authorization Type [employer] Medicare primary (G-code & progress note every 10 visits) & [employer] 2nd   PT Start Time [phone]   PT Stop Time 0930   PT Time Calculation (min) 38 min   Activity Tolerance Patient tolerated treatment well   Behavior During Therapy [facility] for tasks assessed/performed      Past Medical History:  Diagnosis Date  . Allergy   . Arthritis   . GERD (gastroesophageal reflux disease)   . Goiter   . Kidney stone   . Memory loss   . Osteoporosis    in lower back per pt   . Plantar fasciitis   . Stroke ([place])   . Tremors of nervous system   . Vertigo     Past Surgical History:  Procedure Laterality Date  . APPENDECTOMY    . BLADDER SURGERY    . CHOLECYSTECTOMY    . FOOT SURGERY    . TOTAL ABDOMINAL HYSTERECTOMY      There were no vitals filed for this visit.      Subjective Assessment - 08/28/16 0900    Subjective Pt reporting significant back pain on Monday; had to take medication and rest in bed for relief; feels she over did it at home.  Has back brace on today for support.  Still reporting dizziness with sudden turns at home and sit <> stand; continued discussion about wearing compression stockings.  Pt has stockings at home-will have daughter in law assist with donning one morning.  Overall reporting no change in dizziness.     Pertinent History BPPV, CVA, Lumbar spondylosis / DJD, arthritis (reports back, hips, knees & hands worst), DM2, osteoporosis, chronic pain syndrome, obesity   Patient Stated Goals To  stop dizziness, go up steps safely, walking without falling including to daughter's 2 blocks away   Currently in Pain? No/denies  wearing back brace today           [facility] Adult PT Treatment/Exercise - 08/28/16 0921      Ambulation/Gait   Stairs Yes   Stairs Assistance 6: Modified independent (Device/Increase time)   Stair Management Technique One rail Right;Step to pattern;Forwards  step to due to pain in hip, trendelenberg hip drop noted   Number of Stairs 8   Height of Stairs 6   Ramp 5: Supervision   Ramp Details (indicate cue type and reason) with rollator   Curb 4: Min assist   Curb Details (indicate cue type and reason) with rollator and verbal cues for sequencing     Standardized Balance Assessment   Standardized Balance Assessment Timed Up and Go Test     Timed Up and Go Test   TUG Normal TUG   Normal TUG (seconds) 17.91  without AD; 19.56 with rollator     Knee/Hip Exercises: Standing   Hip ADduction Strengthening;Right;Left;10 reps   Hip ADduction Limitations closed chain hip drops on 6" step           PT Education - 08/28/16 0932    Education provided Yes  Education Details Stockings for edema and BP, goals met, continued focus of PT-balance-especially with turns, safety with Rollator   Person(s) Educated Patient   Methods Explanation;Demonstration   Comprehension Verbalized understanding          PT Short Term Goals - 08/28/16 0902      PT SHORT TERM GOAL #1   Title Patient demonstrates understanding of initial HEP. (Target Date: 08/29/2016)   Baseline met 08/28/16   Time 1   Period Months   Status Achieved     PT SHORT TERM GOAL #2   Title Patient reports 25% improvement in dizziness with movements. (Target Date: 08/29/2016)   Baseline Reports dizziness is same as eval-08/28/16   Time 1   Period Months   Status Not Met     PT SHORT TERM GOAL #3   Title Timed Up-Go <13.5 seconds (Target Date: 08/29/2016)   Baseline 08/28/16: 17.91 without AD; 28.85  sec with cane, 19.56 sec with rollator   Time 1   Period Months   Status Not Met     PT SHORT TERM GOAL #4   Title Patient ambulates 300' with rollator walker safely including neg ramps & curbs modified independent. (Target Date: 08/29/2016)   Baseline initiated training on 08/28/16   Time 1   Period Months   Status On-going           PT Long Term Goals - 07/30/16 1610      PT LONG TERM GOAL #1   Title Patient demonstrates & verbalizes understanding of ongoing fitness plan / HEP. (Target Date: 09/26/2016)   Time 2   Period Months     PT LONG TERM GOAL #2   Title Patient reports 50% improvement in dizziness with movements. (Target Date: 09/26/2016)   Time 2   Period Months   Status New     PT LONG TERM GOAL #3   Title Cognitive TUG < 15 seconds with simple naming task to indicate lower fall risk. (Target Date: 09/26/2016)   Time 2   Period Months   Status New     PT LONG TERM GOAL #4   Title Berg Balance >45/56 to indicate lower fall risk. (Target Date: 09/26/2016)   Time 2   Period Months   Status New     PT LONG TERM GOAL #5   Title Patient ambulates with LRAD 500' outdoor surfaces including ramps, curbs & stairs (1 rail) modified independent for safe community mobility. (Target Date: 09/26/2016)   Time 2   Period Months   Status New               Plan - 08/28/16 1041    Clinical Impression Statement Treatment session focused on assessment of progress and re-assessment of STG; pt has met 1/4 STG.  Pt is independent with HEP but continue to report same intensity of dizziness with sit <> stand and sudden turns.  Discussed with pt response of MD regarding compression stockings; continued to advise pt to don stockings in the am (with assistance of daughter in law) to assist with BP management and orthostasis during positional changes.  Will continue to address gaze stability and balance with head turns and changes in direction.  Pt did not meet TUG goal but is progressing.   Initiated hip ABD strengthening for stair negotiation and community gait training with rollator over ramp and curb; will continue to address.  Pt does demonstrate improved safety at home with no falls over past few weeks.  Rehab Potential Good   Clinical Impairments Affecting Rehab Potential BPPV, CVA, Lumbar spondylosis / DJD, arthritis (reports back, hips, knees & hands worst), DM2, osteoporosis, chronic pain syndrome, obesity, h/o migraines with dizziness/nausea/light sensitivity   PT Treatment/Interventions ADLs/Self Care Home Management;DME Instruction;Gait training;Stair training;Functional mobility training;Therapeutic activities;Therapeutic exercise;Balance training;Neuromuscular re-education;Patient/family education;Vestibular   PT Next Visit Plan Monitor vitals as indicated; Continue safe community gait training with rollator, continue hip ABD strengthening, balance and gaze stabilization training with sudden head turns/changes in direction   Consulted and Agree with Plan of Care Patient      Patient will benefit from skilled therapeutic intervention in order to improve the following deficits and impairments:  Abnormal gait, Decreased activity tolerance, Decreased balance, Decreased endurance, Decreased knowledge of precautions, Decreased knowledge of use of DME, Decreased mobility, Decreased strength, Dizziness, Postural dysfunction, Obesity, Pain  Visit Diagnosis: Dizziness and giddiness  Unsteadiness on feet  Other abnormalities of gait and mobility  Muscle weakness (generalized)  Repeated falls     Problem List Patient Active Problem List   Diagnosis Date Noted  . Urge incontinence 07/25/2016  . Impaired functional mobility, balance, and endurance 07/24/2016  . Falls 05/23/2016  . Warts 05/23/2016  . Chronic dental pain 02/22/2016  . Subacute frontal sinusitis 09/05/2015  . 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 Disorder of  sacroiliac joint 05/15/2015  . Dyslipidemia associated with type 2 diabetes mellitus ([place]) 04/26/2015  . Benign paroxysmal positional vertigo 01/24/2015  . Diabetes mellitus type 2, uncomplicated ([place]) 95/39/6728  . Tremor of both hands 04/25/2014  . Eczema 09/20/2013  . Otalgia of right ear 09/20/2013  . Dental abscess 08/18/2013  . Vitamin D deficiency 07/11/2013  . Mood disorder ([place]) 07/11/2013  . Osteopenia 11/04/2012  . Heel pain 08/14/2011  . Atony of bladder 06/10/2010  . Uterovaginal prolapse, incomplete 06/10/2010  . VAGINITIS, ATROPHIC 05/17/2010  . 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 Chronic pain syndrome 04/26/2010  . GOITER NOS 09/17/2006  . OBESITY, NOS 09/17/2006  . GASTROESOPHAGEAL REFLUX, NO ESOPHAGITIS 09/17/2006    Raylene Everts, PT, DPT 08/28/16    10:48 AM    [facility] [address] [place], [place], [address] Phone: [phone]   Fax:  [phone]  Name: [name] MRN: [id number] Date of Birth: 1955-12-07

## 2016-09-01 ENCOUNTER — Ambulatory Visit: Payer: Medicare HMO | Admitting: Rehabilitative and Restorative Service Providers"

## 2016-09-01 DIAGNOSIS — R2681 Unsteadiness on feet: Secondary | ICD-10-CM

## 2016-09-01 DIAGNOSIS — R2689 Other abnormalities of gait and mobility: Secondary | ICD-10-CM

## 2016-09-01 DIAGNOSIS — R42 Dizziness and giddiness: Secondary | ICD-10-CM | POA: Diagnosis not present

## 2016-09-01 DIAGNOSIS — M6281 Muscle weakness (generalized): Secondary | ICD-10-CM

## 2016-09-01 NOTE — Patient Instructions (Signed)
Upward Diagonal (Resistive Band)    Using other hand as anchor, pull band up and out to side with palm facing up. Hold __3-5__ seconds. Repeat __10__ times. Do __2__ sessions per day.  Copyright  VHI. All rights reserved.

## 2016-09-01 NOTE — Therapy (Signed)
[facility] [address] [place], [place], [address] Phone: [phone]   Fax:  [phone]  Physical Therapy Treatment  Patient Details  Name: [name] MRN: [id number] Date of Birth: [birth year] Referring Provider: Sherren Mocha McDiarmid, MD  Encounter Date: 09/01/2016      PT End of Session - 09/01/16 0903    Visit Number 7   Number of Visits 18   Date for PT Re-Evaluation 09/26/16   Authorization Type [employer] Medicare primary (G-code & progress note every 10 visits) & BC[employer] 2nd   PT Start Time 0855   PT Stop Time 0927   PT Time Calculation (min) 32 min   Equipment Utilized During Treatment Gait belt   Activity Tolerance Patient tolerated treatment well   Behavior During Therapy [facility] for tasks assessed/performed      Past Medical History:  Diagnosis Date  . Allergy   . Arthritis   . GERD (gastroesophageal reflux disease)   . Goiter   . Kidney stone   . Memory loss   . Osteoporosis    in lower back per pt   . Plantar fasciitis   . Stroke ([place])   . Tremors of nervous system   . Vertigo     Past Surgical History:  Procedure Laterality Date  . APPENDECTOMY    . BLADDER SURGERY    . CHOLECYSTECTOMY    . FOOT SURGERY    . TOTAL ABDOMINAL HYSTERECTOMY      There were no vitals filed for this visit.      Subjective Assessment - 09/01/16 0854    Subjective The patient is running behind this morning per report (arrived 10 min late).  She brought the rollater RW with seat and her quad cane.  She estimates using this rollater RW 50% of the time in the community.  She decides whether to use it based on back pain--she feels R sided back pain is getting worse.    Patient Stated Goals To stop dizziness, go up steps safely, walking without falling including to daughter's 2 blocks away   Currently in Pain? Yes   Pain Score 5    Pain Location Back   Pain Orientation Right   Pain Descriptors / Indicators Aching    Pain Type Chronic pain   Pain Onset More than a month ago   Pain Frequency Intermittent   Aggravating Factors  R back pain worse when sitting   Pain Relieving Factors unsure   Effect of Pain on Daily Activities `                Vestibular Assessment - 09/01/16 0909      Vestibular Assessment   General Observation The patient describes a "blurriness" even after finishing head motion.  She notes that day to day once she gets increased dizziness + headache, blurriness continues at rest.  Her blood sugar is 150 today.      Symptom Behavior   Type of Dizziness Blurred vision  pounding headache   Frequency of Dizziness 3-4 times/day                 [facility] Adult PT Treatment/Exercise - 09/01/16 0904      Ambulation/Gait   Ambulation/Gait Yes   Ambulation/Gait Assistance 4: Min guard;5: Supervision   Ambulation/Gait Assistance Details requires supervision to CGA   Ambulation Distance (Feet) 345 Feet  x 3 reps with rollater RW   Assistive device 4-wheeled walker   Ambulation Surface Level  Gait Comments Forward/backwards walking near countertop with intermittent UE support x 4 reps x 10 feet.  Gait with wider base of support near countertop.       Self-Care   Self-Care Other Self-Care Comments   Other Self-Care Comments  patient and PT recommended continued use of rollater RW as this is safest gait pattern observed in PT to this point.       Neck Exercises: Seated   Shoulder Rolls 5 reps   Other Seated Exercise Seated shoulder abduction wide/narrow x 10 reps without resistance, seated yellow band diagonal pattern x 10 reps R and then L.      Knee/Hip Exercises: Stretches   Other Knee/Hip Stretches seated piriformis stretch R and L x 30 second hold.      Knee/Hip Exercises: Standing   Hip Abduction Stengthening;Right;Left;10 reps  with UE support         Vestibular Treatment/Exercise - 09/01/16 0904      Vestibular Treatment/Exercise   Vestibular Treatment  Provided Habituation;Gaze   Habituation Exercises Seated Horizontal Head Turns;Seated Vertical Head Turns;Standing Horizontal Head Turns;Standing Vertical Head Turns   Gaze Exercises X1 Viewing Horizontal;X1 Viewing Vertical     Seated Horizontal Head Turns   Number of Reps  5   Symptom Description  Patient notes increased dizziness from 5/10 baseline up to 7/10 + headache + nausea.  Switched to UE/postural exercises while allowing dizziness/motion sensitivity to settle.     Seated Vertical Head Turns   Number of Reps  3   Symptom Description  with cues to move slower- does not increase dizziness, but headache and nausea remain the same.                 PT Education - 09/01/16 [phone]    Education provided Yes   Education Details HEP: added yellow band UE diagonal  x 10 reps (patient requested to work on strenthening arms while sitting for home.  PT emphasized need for other exercises, especially gaze activities. Described working through mild to moderate symptoms.   Person(s) Educated Patient   Methods Explanation;Demonstration;Handout   Comprehension Verbalized understanding;Returned demonstration          PT Short Term Goals - 08/28/16 0902      PT SHORT TERM GOAL #1   Title Patient demonstrates understanding of initial HEP. (Target Date: 08/29/2016)   Baseline met 08/28/16   Time 1   Period Months   Status Achieved     PT SHORT TERM GOAL #2   Title Patient reports 25% improvement in dizziness with movements. (Target Date: 08/29/2016)   Baseline Reports dizziness is same as eval-08/28/16   Time 1   Period Months   Status Not Met     PT SHORT TERM GOAL #3   Title Timed Up-Go <13.5 seconds (Target Date: 08/29/2016)   Baseline 08/28/16: 17.91 without AD; 28.85 sec with cane, 19.56 sec with rollator   Time 1   Period Months   Status Not Met     PT SHORT TERM GOAL #4   Title Patient ambulates 300' with rollator walker safely including neg ramps & curbs modified independent.  (Target Date: 08/29/2016)   Baseline initiated training on 08/28/16   Time 1   Period Months   Status On-going           PT Long Term Goals - 07/30/16 8811      PT LONG TERM GOAL #1   Title Patient demonstrates & verbalizes understanding of ongoing fitness plan /  HEP. (Target Date: 09/26/2016)   Time 2   Period Months     PT LONG TERM GOAL #2   Title Patient reports 50% improvement in dizziness with movements. (Target Date: 09/26/2016)   Time 2   Period Months   Status New     PT LONG TERM GOAL #3   Title Cognitive TUG < 15 seconds with simple naming task to indicate lower fall risk. (Target Date: 09/26/2016)   Time 2   Period Months   Status New     PT LONG TERM GOAL #4   Title Berg Balance >45/56 to indicate lower fall risk. (Target Date: 09/26/2016)   Time 2   Period Months   Status New     PT LONG TERM GOAL #5   Title Patient ambulates with LRAD 500' outdoor surfaces including ramps, curbs & stairs (1 rail) modified independent for safe community mobility. (Target Date: 09/26/2016)   Time 2   Period Months   Status New               Plan - 09/01/16 0932    Clinical Impression Statement The patient demonstrates safer ambulation with RW with less evidence of scissoring steps.  Patient tolerance to activity limited with continued dizziness worse with head motion further described as    PT Treatment/Interventions ADLs/Self Care Home Management;DME Instruction;Gait training;Stair training;Functional mobility training;Therapeutic activities;Therapeutic exercise;Balance training;Neuromuscular re-education;Patient/family education;Vestibular   PT Next Visit Plan Monitor vitals as indicated; Continue safe community gait training with rollator, continue hip ABD strengthening, balance and gaze stabilization training with sudden head turns/changes in direction   Consulted and Agree with Plan of Care Patient      Patient will benefit from skilled therapeutic intervention in order  to improve the following deficits and impairments:  Abnormal gait, Decreased activity tolerance, Decreased balance, Decreased endurance, Decreased knowledge of precautions, Decreased knowledge of use of DME, Decreased mobility, Decreased strength, Dizziness, Postural dysfunction, Obesity, Pain  Visit Diagnosis: Dizziness and giddiness  Unsteadiness on feet  Other abnormalities of gait and mobility  Muscle weakness (generalized)     Problem List Patient Active Problem List   Diagnosis Date Noted  . Urge incontinence 07/25/2016  . Impaired functional mobility, balance, and endurance 07/24/2016  . Falls 05/23/2016  . Warts 05/23/2016  . Chronic dental pain 02/22/2016  . Subacute frontal sinusitis 09/05/2015  . 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 Disorder of sacroiliac joint 05/15/2015  . Dyslipidemia associated with type 2 diabetes mellitus ([place]) 04/26/2015  . Benign paroxysmal positional vertigo 01/24/2015  . Diabetes mellitus type 2, uncomplicated ([place]) 79/08/4095  . Tremor of both hands 04/25/2014  . Eczema 09/20/2013  . Otalgia of right ear 09/20/2013  . Dental abscess 08/18/2013  . Vitamin D deficiency 07/11/2013  . Mood disorder ([place]) 07/11/2013  . Osteopenia 11/04/2012  . Heel pain 08/14/2011  . Atony of bladder 06/10/2010  . Uterovaginal prolapse, incomplete 06/10/2010  . VAGINITIS, ATROPHIC 05/17/2010  . 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 Chronic pain syndrome 04/26/2010  . GOITER NOS 09/17/2006  . OBESITY, NOS 09/17/2006  . GASTROESOPHAGEAL REFLUX, NO ESOPHAGITIS 09/17/2006    Lucendia Leard, PT 09/01/2016, 9:37 AM  [facility] [address] [employer] [place], [place], [address] Phone: [phone]   Fax:  [phone]  Name: [name] MRN: [id number] Date of Birth: April 04, 1956

## 2016-09-04 ENCOUNTER — Ambulatory Visit: Payer: Medicare HMO | Admitting: Physical Therapy

## 2016-09-08 ENCOUNTER — Encounter: Payer: Self-pay | Admitting: Physical Therapy

## 2016-09-08 ENCOUNTER — Ambulatory Visit: Payer: Medicare HMO | Admitting: Physical Therapy

## 2016-09-08 DIAGNOSIS — R2681 Unsteadiness on feet: Secondary | ICD-10-CM

## 2016-09-08 DIAGNOSIS — R2689 Other abnormalities of gait and mobility: Secondary | ICD-10-CM

## 2016-09-08 DIAGNOSIS — R42 Dizziness and giddiness: Secondary | ICD-10-CM

## 2016-09-08 DIAGNOSIS — M6281 Muscle weakness (generalized): Secondary | ICD-10-CM

## 2016-09-08 DIAGNOSIS — R296 Repeated falls: Secondary | ICD-10-CM

## 2016-09-08 NOTE — Patient Instructions (Signed)
Abduction: Clam (Eccentric) - Side-Lying    Lie on side with knees bent. Lift top knee, keeping feet together. Keep trunk steady. Slowly lower for 3-5 seconds. _10__ reps per side, _2__ sets per day, _5__ days per week.  Abduction: Side Leg Lift (Eccentric) - Side-Lying    Lie on side. Lift top leg slightly higher than shoulder level. Keep top leg straight with body, toes pointing forward. Slowly lower for 3-5 seconds. _10__ reps per side, _2__ sets per day, _5__ days per week.  Bridge    Lie back, legs bent. Inhale, squeeze buttocks pressing hips up. Keeping ribs in, lengthen lower back. Exhale, rolling down along spine from top. Repeat _10__ times. Do __2__ sessions per day.   Piriformis (Supine)    Cross legs, right on top. Gently pull other knee toward chest until stretch is felt in buttock/hip of top leg OR push crossed knee away from chest. Hold _30___ seconds. Repeat __1__ times per side.  Do __2__ sessions per day.   Single Leg - Eyes Open    Holding support, lift right leg while maintaining balance over other leg. Progress to removing hands from support surface for longer periods of time. Hold__12__ seconds. Repeat __2__ times per side. Do __2__ sessions per day.  Copyright  VHI. All rights reserved.

## 2016-09-08 NOTE — Therapy (Signed)
[facility] [address] [place], [place], [address] Phone: [phone]   Fax:  [phone]  Physical Therapy Treatment  Patient Details  Name: [name] MRN: [id number] Date of Birth: 07-06-1956 Referring Provider: Sherren Mocha McDiarmid, MD  Encounter Date: 09/08/2016      PT End of Session - 09/08/16 0940    Visit Number 8   Number of Visits 18   Date for PT Re-Evaluation 09/26/16   Authorization Type [employer] Medicare primary (G-code & progress note every 10 visits) & [employer] 2nd   PT Start Time 0850   PT Stop Time 0938   PT Time Calculation (min) 48 min   Activity Tolerance Patient tolerated treatment well   Behavior During Therapy [facility] for tasks assessed/performed      Past Medical History:  Diagnosis Date  . Allergy   . Arthritis   . GERD (gastroesophageal reflux disease)   . Goiter   . Kidney stone   . Memory loss   . Osteoporosis    in lower back per pt   . Plantar fasciitis   . Stroke ([place])   . Tremors of nervous system   . Vertigo     Past Surgical History:  Procedure Laterality Date  . APPENDECTOMY    . BLADDER SURGERY    . CHOLECYSTECTOMY    . FOOT SURGERY    . TOTAL ABDOMINAL HYSTERECTOMY      There were no vitals filed for this visit.      Subjective Assessment - 09/08/16 0852    Subjective Pt wore compression stockings one time-did not see big difference in dizziness but did see improvement in LE edema.  Reports she needs to wear them again.  Reporting significant increase in hip pain during sit <> stand and during stair negotiation.  Has not been wearing LSO, needs help to don.  Brought rollator in.   Pertinent History BPPV, CVA, Lumbar spondylosis / DJD, arthritis (reports back, hips, knees & hands worst), DM2, osteoporosis, chronic pain syndrome, obesity   Limitations Standing;Walking;House hold activities   Patient Stated Goals To stop dizziness, go up steps safely, walking without  falling including to daughter's 2 blocks away   Currently in Pain? Yes   Pain Score 5    Pain Location Hip   Pain Orientation Right   Pain Descriptors / Indicators Aching   Pain Type Chronic pain           [facility] Adult PT Treatment/Exercise - 09/08/16 0854      Exercises   Exercises Knee/Hip     Lumbar Exercises: Stretches   Single Knee to Chest Stretch 1 rep;30 seconds   Single Knee to Chest Stretch Limitations R and LLE   Double Knee to Chest Stretch 30 seconds   Piriformis Stretch 1 rep;30 seconds   Piriformis Stretch Limitations R and LLE, ankle crossed over opposite knee     Knee/Hip Exercises: Supine   Bridges Strengthening;Both;10 reps   Other Supine Knee/Hip Exercises Single leg theraband resisted clamshell, 10 reps each LE, green theraband     Knee/Hip Exercises: Sidelying   Hip ABduction Strengthening;Right;Left;2 sets;10 reps   Hip ABduction Limitations open chain ABD and ABD + hip flexion<>extension x 10 reps each             Balance Exercises - 09/08/16 0921      Balance Exercises: Standing   SLS Eyes open;Upper extremity support 1;2 reps;10 secs   Sidestepping 3 reps;Upper extremity support  to L and R, one UE support   Turning Right;Left;5 reps  90 deg and 180 deg           PT Education - 09/08/16 0940    Education provided Yes   Education Details HEP for hip ROM and strengthening   Person(s) Educated Patient   Methods Explanation;Demonstration;Handout   Comprehension Verbalized understanding;Returned demonstration          PT Short Term Goals - 08/28/16 0902      PT SHORT TERM GOAL #1   Title Patient demonstrates understanding of initial HEP. (Target Date: 08/29/2016)   Baseline met 08/28/16   Time 1   Period Months   Status Achieved     PT SHORT TERM GOAL #2   Title Patient reports 25% improvement in dizziness with movements. (Target Date: 08/29/2016)   Baseline Reports dizziness is same as eval-08/28/16   Time 1   Period Months    Status Not Met     PT SHORT TERM GOAL #3   Title Timed Up-Go <13.5 seconds (Target Date: 08/29/2016)   Baseline 08/28/16: 17.91 without AD; 28.85 sec with cane, 19.56 sec with rollator   Time 1   Period Months   Status Not Met     PT SHORT TERM GOAL #4   Title Patient ambulates 300' with rollator walker safely including neg ramps & curbs modified independent. (Target Date: 08/29/2016)   Baseline initiated training on 08/28/16   Time 1   Period Months   Status On-going           PT Long Term Goals - 07/30/16 9628      PT LONG TERM GOAL #1   Title Patient demonstrates & verbalizes understanding of ongoing fitness plan / HEP. (Target Date: 09/26/2016)   Time 2   Period Months     PT LONG TERM GOAL #2   Title Patient reports 50% improvement in dizziness with movements. (Target Date: 09/26/2016)   Time 2   Period Months   Status New     PT LONG TERM GOAL #3   Title Cognitive TUG < 15 seconds with simple naming task to indicate lower fall risk. (Target Date: 09/26/2016)   Time 2   Period Months   Status New     PT LONG TERM GOAL #4   Title Berg Balance >45/56 to indicate lower fall risk. (Target Date: 09/26/2016)   Time 2   Period Months   Status New     PT LONG TERM GOAL #5   Title Patient ambulates with LRAD 500' outdoor surfaces including ramps, curbs & stairs (1 rail) modified independent for safe community mobility. (Target Date: 09/26/2016)   Time 2   Period Months   Status New               Plan - 09/08/16 0940    Clinical Impression Statement Focus of today's session on hip ABD and extensor strengthening and hip ROM due to continued pain and weakness.  Also continued to focus on vestibular treatment during 90 deg and 180 turns that caused mild increases in symptoms that resolved quickly.  Pt to continue to ambulate with rollator for improved safety and decrease falls risk.   Rehab Potential Good   Clinical Impairments Affecting Rehab Potential BPPV, CVA, Lumbar  spondylosis / DJD, arthritis (reports back, hips, knees & hands worst), DM2, osteoporosis, chronic pain syndrome, obesity, h/o migraines with dizziness/nausea/light sensitivity   PT Treatment/Interventions ADLs/Self Care Home Management;DME Instruction;Gait training;Stair training;Functional  mobility training;Therapeutic activities;Therapeutic exercise;Balance training;Neuromuscular re-education;Patient/family education;Vestibular   PT Next Visit Plan Monitor vitals as indicated; Continue safe community gait training with rollator, continue hip ABD strengthening, balance and gaze stabilization training with sudden head turns/changes in direction   Consulted and Agree with Plan of Care Patient      Patient will benefit from skilled therapeutic intervention in order to improve the following deficits and impairments:  Abnormal gait, Decreased activity tolerance, Decreased balance, Decreased endurance, Decreased knowledge of precautions, Decreased knowledge of use of DME, Decreased mobility, Decreased strength, Dizziness, Postural dysfunction, Obesity, Pain  Visit Diagnosis: Dizziness and giddiness  Unsteadiness on feet  Other abnormalities of gait and mobility  Muscle weakness (generalized)  Repeated falls     Problem List Patient Active Problem List   Diagnosis Date Noted  . Urge incontinence 07/25/2016  . Impaired functional mobility, balance, and endurance 07/24/2016  . Falls 05/23/2016  . Warts 05/23/2016  . Chronic dental pain 02/22/2016  . Subacute frontal sinusitis 09/05/2015  . 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 Disorder of sacroiliac joint 05/15/2015  . Dyslipidemia associated with type 2 diabetes mellitus ([place]) 04/26/2015  . Benign paroxysmal positional vertigo 01/24/2015  . Diabetes mellitus type 2, uncomplicated ([place]) 49/67/5916  . Tremor of both hands 04/25/2014  . Eczema 09/20/2013  . Otalgia of right ear 09/20/2013  .  Dental abscess 08/18/2013  . Vitamin D deficiency 07/11/2013  . Mood disorder ([place]) 07/11/2013  . Osteopenia 11/04/2012  . Heel pain 08/14/2011  . Atony of bladder 06/10/2010  . Uterovaginal prolapse, incomplete 06/10/2010  . VAGINITIS, ATROPHIC 05/17/2010  . 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 Chronic pain syndrome 04/26/2010  . GOITER NOS 09/17/2006  . OBESITY, NOS 09/17/2006  . GASTROESOPHAGEAL REFLUX, NO ESOPHAGITIS 09/17/2006   Raylene Everts, PT, DPT 09/08/16    9:44 AM    [facility] [address] [place], [place], [address] Phone: [phone]   Fax:  [phone]  Name: [name] MRN: [id number] Date of Birth: 11/04/1955

## 2016-09-11 ENCOUNTER — Ambulatory Visit: Payer: Medicare HMO | Admitting: Rehabilitative and Restorative Service Providers"

## 2016-09-15 ENCOUNTER — Ambulatory Visit: Payer: Medicare HMO | Admitting: Rehabilitative and Restorative Service Providers"

## 2016-09-15 DIAGNOSIS — R2681 Unsteadiness on feet: Secondary | ICD-10-CM

## 2016-09-15 DIAGNOSIS — R2689 Other abnormalities of gait and mobility: Secondary | ICD-10-CM

## 2016-09-15 DIAGNOSIS — M6281 Muscle weakness (generalized): Secondary | ICD-10-CM

## 2016-09-15 DIAGNOSIS — R42 Dizziness and giddiness: Secondary | ICD-10-CM

## 2016-09-15 NOTE — Therapy (Signed)
[facility] [address] [place], [place], [address] Phone: [phone]   Fax:  [phone]  Physical Therapy Treatment  Patient Details  Name: [name] MRN: [id number] Date of Birth: 06/09/1956 Referring Provider: Sherren Mocha McDiarmid, MD  Encounter Date: 09/15/2016      PT End of Session - 09/15/16 0933    Visit Number 9   Number of Visits 18   Date for PT Re-Evaluation 09/26/16   Authorization Type [employer] Medicare primary (G-code & progress note every 10 visits) & [employer] 2nd   PT Start Time [id number]   PT Stop Time 0932   PT Time Calculation (min) 40 min   Activity Tolerance Patient tolerated treatment well   Behavior During Therapy [facility] for tasks assessed/performed      Past Medical History:  Diagnosis Date  . Allergy   . Arthritis   . GERD (gastroesophageal reflux disease)   . Goiter   . Kidney stone   . Memory loss   . Osteoporosis    in lower back per pt   . Plantar fasciitis   . Stroke ([place])   . Tremors of nervous system   . Vertigo     Past Surgical History:  Procedure Laterality Date  . APPENDECTOMY    . BLADDER SURGERY    . CHOLECYSTECTOMY    . FOOT SURGERY    . TOTAL ABDOMINAL HYSTERECTOMY      There were no vitals filed for this visit.      Subjective Assessment - 09/15/16 0853    Subjective "This is irritating my back and my hip bad."  She notes she is unable to tolerate HEP due to back pain.  She feels like her gait is slowed because of pain. She is wearing compression stockings, but cannot tell if they help at all.   She does note it reduces ankle swelling.  Blood sugar over 200 today.   Pertinent History BPPV, CVA, Lumbar spondylosis / DJD, arthritis (reports back, hips, knees & hands worst), DM2, osteoporosis, chronic pain syndrome, obesity   Patient Stated Goals To stop dizziness, go up steps safely, walking without falling including to daughter's 2 blocks away   Currently in Pain? Yes    Pain Score 7    Pain Location Back   Pain Orientation Left   Pain Descriptors / Indicators Aching   Pain Type Chronic pain   Pain Onset More than a month ago   Pain Frequency Intermittent   Aggravating Factors  Up moving   Pain Relieving Factors sitting to rest                Vestibular Assessment - 09/15/16 0856      Vestibular Assessment   General Observation The patient notes that she gets "real lightheaded" and nauseous with vomitting intermittently when getting up out of bed, and when moving around.    She also feels leg cramps are coming back during the night.       Symptom Behavior   Type of Dizziness "Funny feeling in head"  also notes double vision and occasional visual hallucination   Frequency of Dizziness daily   Duration of Dizziness minutes   Aggravating Factors Activity in general   Relieving Factors Medication;Rest                 [facility] Adult PT Treatment/Exercise - 09/15/16 0914      Transfers   Transfers Sit to Stand   Sit to Stand 6:  Modified independent (Device/Increase time)  needs cues to push up from mat vs pull on rollater     Ambulation/Gait   Ambulation/Gait Yes   Ambulation/Gait Assistance 6: Modified independent (Device/Increase time)   Ambulation Distance (Feet) 500 Feet  x 2 reps   Assistive device 4-wheeled walker   Ambulation Surface Level     Self-Care   Self-Care Other Self-Care Comments   Other Self-Care Comments  seated posture/positioning with feet flat and towel roll at lumbar spine.     Exercises   Exercises Knee/Hip;Lumbar     Lumbar Exercises: Supine   Glut Set 5 reps   Glut Set Limitations in hooklying   Straight Leg Raise 5 reps   Straight Leg Raises Limitations notes pain in L low back a nd hip with R sided SLR   Other Supine Lumbar Exercises isometric hip adduction in hooklying x 3 reps when combined with pelvic tilt/isometric glut hold   Other Supine Lumbar Exercises Gentle trunk rotation                 PT Education - 09/15/16 1044    Education provided Yes   Education Details Recommended HEP x 5 reps performing 2 separate exercises and slowly increase   Person(s) Educated Patient   Methods Explanation   Comprehension Verbalized understanding          PT Short Term Goals - 08/28/16 0902      PT SHORT TERM GOAL #1   Title Patient demonstrates understanding of initial HEP. (Target Date: 08/29/2016)   Baseline met 08/28/16   Time 1   Period Months   Status Achieved     PT SHORT TERM GOAL #2   Title Patient reports 25% improvement in dizziness with movements. (Target Date: 08/29/2016)   Baseline Reports dizziness is same as eval-08/28/16   Time 1   Period Months   Status Not Met     PT SHORT TERM GOAL #3   Title Timed Up-Go <13.5 seconds (Target Date: 08/29/2016)   Baseline 08/28/16: 17.91 without AD; 28.85 sec with cane, 19.56 sec with rollator   Time 1   Period Months   Status Not Met     PT SHORT TERM GOAL #4   Title Patient ambulates 300' with rollator walker safely including neg ramps & curbs modified independent. (Target Date: 08/29/2016)   Baseline initiated training on 08/28/16   Time 1   Period Months   Status On-going           PT Long Term Goals - 07/30/16 5427      PT LONG TERM GOAL #1   Title Patient demonstrates & verbalizes understanding of ongoing fitness plan / HEP. (Target Date: 09/26/2016)   Time 2   Period Months     PT LONG TERM GOAL #2   Title Patient reports 50% improvement in dizziness with movements. (Target Date: 09/26/2016)   Time 2   Period Months   Status New     PT LONG TERM GOAL #3   Title Cognitive TUG < 15 seconds with simple naming task to indicate lower fall risk. (Target Date: 09/26/2016)   Time 2   Period Months   Status New     PT LONG TERM GOAL #4   Title Berg Balance >45/56 to indicate lower fall risk. (Target Date: 09/26/2016)   Time 2   Period Months   Status New     PT LONG TERM GOAL #5   Title Patient  ambulates with  LRAD 500' outdoor surfaces including ramps, curbs & stairs (1 rail) modified independent for safe community mobility. (Target Date: 09/26/2016)   Time 2   Period Months   Status New               Plan - 09/15/16 1045    Clinical Impression Statement PT and patient emphasized overall mobility.  She has multiple factors that hinder safe mobility and we discussed using rollater to increase tolerance to activity as she can do >500 ft without back pain in the clinic.  Also discussed strategies to exercise in sitting before transition to stand to limit lightheadedness.  Recommend continued HEP at lower reps to condition herself to tolerate.    PT Treatment/Interventions ADLs/Self Care Home Management;DME Instruction;Gait training;Stair training;Functional mobility training;Therapeutic activities;Therapeutic exercise;Balance training;Neuromuscular re-education;Patient/family education;Vestibular   PT Next Visit Plan Monitor vitals as indicated; Continue safe community gait training with rollator, continue hip ABD strengthening, balance and gaze stabilization training with sudden head turns/changes in direction   Consulted and Agree with Plan of Care Patient      Patient will benefit from skilled therapeutic intervention in order to improve the following deficits and impairments:  Abnormal gait, Decreased activity tolerance, Decreased balance, Decreased endurance, Decreased knowledge of precautions, Decreased knowledge of use of DME, Decreased mobility, Decreased strength, Dizziness, Postural dysfunction, Obesity, Pain  Visit Diagnosis: Dizziness and giddiness  Unsteadiness on feet  Other abnormalities of gait and mobility  Muscle weakness (generalized)     Problem List Patient Active Problem List   Diagnosis Date Noted  . Urge incontinence 07/25/2016  . Impaired functional mobility, balance, and endurance 07/24/2016  . Falls 05/23/2016  . Warts 05/23/2016  . Chronic  dental pain 02/22/2016  . Subacute frontal sinusitis 09/05/2015  . 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 Disorder of sacroiliac joint 05/15/2015  . Dyslipidemia associated with type 2 diabetes mellitus ([place]) 04/26/2015  . Benign paroxysmal positional vertigo 01/24/2015  . Diabetes mellitus type 2, uncomplicated ([place]) 73/57/8978  . Tremor of both hands 04/25/2014  . Eczema 09/20/2013  . Otalgia of right ear 09/20/2013  . Dental abscess 08/18/2013  . Vitamin D deficiency 07/11/2013  . Mood disorder ([place]) 07/11/2013  . Osteopenia 11/04/2012  . Heel pain 08/14/2011  . Atony of bladder 06/10/2010  . Uterovaginal prolapse, incomplete 06/10/2010  . VAGINITIS, ATROPHIC 05/17/2010  . 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 Chronic pain syndrome 04/26/2010  . GOITER NOS 09/17/2006  . OBESITY, NOS 09/17/2006  . GASTROESOPHAGEAL REFLUX, NO ESOPHAGITIS 09/17/2006    Rainna Nearhood, PT 09/15/2016, 10:46 AM  [facility] [address] [place], [place], [address] Phone: [phone]   Fax:  [phone]  Name: QUINA WILBOURNE MRN: [id number] Date of Birth: 1956/05/09

## 2016-09-18 ENCOUNTER — Ambulatory Visit: Payer: Medicare HMO | Attending: Family Medicine | Admitting: Rehabilitative and Restorative Service Providers"

## 2016-09-18 DIAGNOSIS — R2681 Unsteadiness on feet: Secondary | ICD-10-CM | POA: Diagnosis present

## 2016-09-18 DIAGNOSIS — R296 Repeated falls: Secondary | ICD-10-CM | POA: Diagnosis present

## 2016-09-18 DIAGNOSIS — R2689 Other abnormalities of gait and mobility: Secondary | ICD-10-CM

## 2016-09-18 DIAGNOSIS — R42 Dizziness and giddiness: Secondary | ICD-10-CM | POA: Diagnosis present

## 2016-09-18 DIAGNOSIS — M6281 Muscle weakness (generalized): Secondary | ICD-10-CM

## 2016-09-18 NOTE — Therapy (Signed)
[facility] [address] [place], [place], [address] Phone: [phone]   Fax:  [phone]  Physical Therapy Treatment and Discharge Summary  Patient Details  Name: [name] MRN: [id number] Date of Birth: [birth year] Referring Provider: Sherren Mocha McDiarmid, MD  Encounter Date: 09/18/2016      PT End of Session - 09/18/16 0858    Visit Number 10   Number of Visits 18   Date for PT Re-Evaluation 09/26/16   Authorization Type [employer] primary (G-code & progress note every 10 visits) & [employer] 2nd   PT Start Time [phone]   PT Stop Time 0930   PT Time Calculation (min) 39 min   Activity Tolerance Patient tolerated treatment well   Behavior During Therapy [facility] for tasks assessed/performed      Past Medical History:  Diagnosis Date  . Allergy   . Arthritis   . GERD (gastroesophageal reflux disease)   . Goiter   . Kidney stone   . Memory loss   . Osteoporosis    in lower back per pt   . Plantar fasciitis   . Stroke ([place])   . Tremors of nervous system   . Vertigo     Past Surgical History:  Procedure Laterality Date  . APPENDECTOMY    . BLADDER SURGERY    . CHOLECYSTECTOMY    . FOOT SURGERY    . TOTAL ABDOMINAL HYSTERECTOMY      There were no vitals filed for this visit.      Subjective Assessment - 09/18/16 0855    Subjective The patient reports she hasn't done any exercises due to back pain acting up.  Her back pain is managed by primary care and she also gets injections when needed.  She arrives using rollater RW.   The patient describes a sensation intermittently of "could pass out and get blurry vision".     Patient Stated Goals To stop dizziness, go up steps safely, walking without falling including to daughter's 2 blocks away   Currently in Pain? Yes   Pain Score 7    Pain Location Back   Pain Orientation Left   Pain Descriptors / Indicators Aching   Pain Type Chronic pain   Pain Onset More  than a month ago   Pain Frequency Intermittent   Aggravating Factors  walking around   Pain Relieving Factors sitting to rest            [facility] PT Assessment - 09/18/16 0910      Ambulation/Gait   Ambulation/Gait Yes   Ambulation Distance (Feet) 440 Feet   Assistive device 4-wheeled walker   Ambulation Surface Level   Gait velocity 2.19 ft/sec   Stairs Yes     Standardized Balance Assessment   Standardized Balance Assessment Berg Balance Test;Timed Up and Go Test     Berg Balance Test   Sit to Stand Able to stand without using hands and stabilize independently   Standing Unsupported Able to stand safely 2 minutes   Sitting with Back Unsupported but Feet Supported on Floor or Stool Able to sit safely and securely 2 minutes   Stand to Sit Sits safely with minimal use of hands   Transfers Able to transfer safely, minor use of hands   Standing Unsupported with Eyes Closed Able to stand 10 seconds with supervision   Standing Ubsupported with Feet Together Able to place feet together independently and stand for 1 minute with supervision   From Standing,  Reach Forward with Outstretched Arm Can reach forward >12 cm safely (5")   From Standing Position, Pick up Object from Floor Able to pick up shoe, needs supervision   From Standing Position, Turn to Look Behind Over each Shoulder Looks behind from both sides and weight shifts well   Turn 360 Degrees Able to turn 360 degrees safely but slowly   Standing Unsupported, Alternately Place Feet on Step/Stool Able to complete 4 steps without aid or supervision   Standing Unsupported, One Foot in Front Able to take small step independently and hold 30 seconds   Standing on One Leg Tries to lift leg/unable to hold 3 seconds but remains standing independently   Total Score 43   Berg comment: 43/56 improved from 33/56 at evaluation.     Timed Up and Go Test   TUG --  13.91 seconds without a device.                     [facility]  Adult PT Treatment/Exercise - 09/18/16 0910      Self-Care   Self-Care Other Self-Care Comments   Other Self-Care Comments  Discussed barriers to further progress with patient including back pain, vision changes (double vision and blurriness).  Patient is not carrying over home exercises noting her back is acting up more.  PT has educated her on how to modify and recommended continue to tolerance + walk with rollater RW as her # of falls has significantly improved with use of device.                  PT Education - 09/18/16 0857    Education provided Yes   Education Details Discussed continued use of rollater RW due to less falls when utilizing and also to help with back pain that she notes is flared up.     Person(s) Educated Patient   Methods Explanation   Comprehension Verbalized understanding          PT Short Term Goals - 09/18/16 0907      PT SHORT TERM GOAL #1   Title Patient demonstrates understanding of initial HEP. (Target Date: 08/29/2016)   Baseline met 08/28/16   Time 1   Period Months   Status Achieved     PT SHORT TERM GOAL #2   Title Patient reports 25% improvement in dizziness with movements. (Target Date: 08/29/2016)   Baseline Reports dizziness is same as eval-08/28/16   Time 1   Period Months   Status Not Met     PT SHORT TERM GOAL #3   Title Timed Up-Go <13.5 seconds (Target Date: 08/29/2016)   Baseline 08/28/16: 17.91 without AD; 28.85 sec with cane, 19.56 sec with rollator   Time 1   Period Months   Status Not Met     PT SHORT TERM GOAL #4   Title Patient ambulates 300' with rollator walker safely including neg ramps & curbs modified independent. (Target Date: 08/29/2016)   Baseline Met on 09/18/2016   Time 1   Period Months   Status Achieved           PT Long Term Goals - 09/18/16 0907      PT LONG TERM GOAL #1   Title Patient demonstrates & verbalizes understanding of ongoing fitness plan / HEP. (Target Date: 09/26/2016)   Baseline Patient has  HEP and is able to walk safely with rollater RW, however is limited by back pain.   Time 2   Period Months  Status Partially Met     PT LONG TERM GOAL #2   Title Patient reports 50% improvement in dizziness with movements. (Target Date: 09/26/2016)   Baseline Not met:  patient notes intermittent episodes of dizziness that can occur with movementor when sitting still.  She also notes double vision (reports occurs daily).    Time 2   Period Months   Status Not Met     PT LONG TERM GOAL #3   Title Cognitive TUG < 15 seconds with simple naming task to indicate lower fall risk. (Target Date: 09/26/2016)   Baseline Scores 13.9 seconds.   Time 2   Period Months   Status Achieved     PT LONG TERM GOAL #4   Title Berg Balance >45/56 to indicate lower fall risk. (Target Date: 09/26/2016)   Baseline Improved from 33/56 up to 43/56.  Patient continues to be at risk for falls per Merrilee Jansky testing.     Time 2   Period Months   Status Partially Met     PT LONG TERM GOAL #5   Title Patient ambulates with LRAD 500' outdoor surfaces including ramps, curbs & stairs (1 rail) modified independent for safe community mobility. (Target Date: 09/26/2016)   Baseline Patient reports this goal is met as she is accessing community with rollater RW and can negotiate ramps, curbs and stairs.     Time 2   Period Months   Status Achieved               Plan - 09/18/16 1255    Clinical Impression Statement The patient met 2 LTGs, partially  met 2 LTGs and did not meet 1.  PT was scheduled to continue for 1-2 more weeks, however she is not able to participate with activities out of therapy as recommended due to back pain.  *patient to f/u with MD regarding further back pain mgmt (due to more injections per patient0   Clinical Impairments Affecting Rehab Potential BPPV, CVA, Lumbar spondylosis / DJD, arthritis (reports back, hips, knees & hands worst), DM2, osteoporosis, chronic pain syndrome, obesity, h/o migraines  with dizziness/nausea/light sensitivity   PT Treatment/Interventions ADLs/Self Care Home Management;DME Instruction;Gait training;Stair training;Functional mobility training;Therapeutic activities;Therapeutic exercise;Balance training;Neuromuscular re-education;Patient/family education;Vestibular   PT Next Visit Plan Discharge today.   Consulted and Agree with Plan of Care Patient      Patient will benefit from skilled therapeutic intervention in order to improve the following deficits and impairments:  Abnormal gait, Decreased activity tolerance, Decreased balance, Decreased endurance, Decreased knowledge of precautions, Decreased knowledge of use of DME, Decreased mobility, Decreased strength, Dizziness, Postural dysfunction, Obesity, Pain  Visit Diagnosis: Dizziness and giddiness  Unsteadiness on feet  Other abnormalities of gait and mobility  Muscle weakness (generalized)  Repeated falls    PHYSICAL THERAPY DISCHARGE SUMMARY  Visits from Start of Care: 10  Current functional level related to goals / functional outcomes: See above   Remaining deficits: Chronic low back pain Postural deficits Changes in vision (blurry and episodes of double) Lightheadedness Gait instability Weakness   Education / Equipment: Patient has HEP, fall prevention, use of assistive device, modifying HEP due to back pain.  Recommended she f/u with MD regarding visual changes.  Plan: Patient agrees to discharge.  Patient goals were partially met. Patient is being discharged due to meeting the stated rehab goals.  ?????         Thank you for the referral of this patient. Rudell Cobb, MPT  [place], PT 09/18/2016,  1:43 PM  [facility] [address] [place], [place], [address] Phone: [phone]   Fax:  [phone]  Name: [name] MRN: [id number] Date of Birth: March 24, 1956

## 2016-09-22 ENCOUNTER — Encounter: Payer: Commercial Managed Care - HMO | Admitting: Rehabilitative and Restorative Service Providers"

## 2016-09-25 ENCOUNTER — Encounter: Payer: Commercial Managed Care - HMO | Admitting: Rehabilitative and Restorative Service Providers"

## 2016-09-27 ENCOUNTER — Other Ambulatory Visit: Payer: Self-pay | Admitting: Internal Medicine

## 2016-09-27 DIAGNOSIS — M8580 Other specified disorders of bone density and structure, unspecified site: Secondary | ICD-10-CM

## 2016-10-21 ENCOUNTER — Other Ambulatory Visit: Payer: Self-pay | Admitting: Internal Medicine

## 2016-10-21 DIAGNOSIS — E785 Hyperlipidemia, unspecified: Secondary | ICD-10-CM

## 2016-10-21 DIAGNOSIS — G894 Chronic pain syndrome: Secondary | ICD-10-CM

## 2016-10-21 DIAGNOSIS — M47816 Spondylosis without myelopathy or radiculopathy, lumbar region: Secondary | ICD-10-CM

## 2016-10-21 DIAGNOSIS — R112 Nausea with vomiting, unspecified: Secondary | ICD-10-CM

## 2016-10-21 DIAGNOSIS — E1169 Type 2 diabetes mellitus with other specified complication: Secondary | ICD-10-CM

## 2016-10-21 DIAGNOSIS — H8110 Benign paroxysmal vertigo, unspecified ear: Secondary | ICD-10-CM

## 2016-10-21 DIAGNOSIS — M47896 Other spondylosis, lumbar region: Secondary | ICD-10-CM

## 2016-10-21 DIAGNOSIS — K219 Gastro-esophageal reflux disease without esophagitis: Secondary | ICD-10-CM

## 2016-10-22 ENCOUNTER — Other Ambulatory Visit: Payer: Self-pay | Admitting: Internal Medicine

## 2016-10-23 NOTE — Telephone Encounter (Signed)
Appointment scheduled for 5/3 With MD.

## 2016-11-20 ENCOUNTER — Encounter: Payer: Self-pay | Admitting: Internal Medicine

## 2016-11-20 ENCOUNTER — Ambulatory Visit (INDEPENDENT_AMBULATORY_CARE_PROVIDER_SITE_OTHER): Payer: Medicare HMO | Admitting: Internal Medicine

## 2016-11-20 VITALS — BP 108/64 | HR 66 | Temp 97.8°F | Ht 65.0 in | Wt 246.0 lb

## 2016-11-20 DIAGNOSIS — M25561 Pain in right knee: Secondary | ICD-10-CM

## 2016-11-20 DIAGNOSIS — G8929 Other chronic pain: Secondary | ICD-10-CM

## 2016-11-20 DIAGNOSIS — I951 Orthostatic hypotension: Secondary | ICD-10-CM

## 2016-11-20 DIAGNOSIS — E119 Type 2 diabetes mellitus without complications: Secondary | ICD-10-CM | POA: Diagnosis not present

## 2016-11-20 LAB — POCT GLYCOSYLATED HEMOGLOBIN (HGB A1C): Hemoglobin A1C: 7.6

## 2016-11-20 NOTE — Assessment & Plan Note (Signed)
Worsening. A1C increased from 6.7 in 05/2016 to 7.6 today. Patient thinks due to increased ice cream consumption. Discussed dietary changes before adding new medication. Patient came up with goal of decreasing nightly ice cream from 5-6 scoops to 2-3 scoops. Will make change and f/u in 3 months. If no improvement in A1C then, will add additional agent.  - Continue metformin 1000mg  BID - Decrease ice cream to 2-3 scoops, however stressed that no ice cream is best - F/u in 3 months for repeat A1C - Encouraged to write down blood sugars and bring to next visit

## 2016-11-20 NOTE — Progress Notes (Signed)
Subjective:   Patient: [name]       Birthdate: Oct 30, 1955       MRN: [id number]      HPI  [name] is a 60 y.o. female presenting for chronic knee pain, hypotension, and diabetes f/u.   Chronic R knee pain Patient has been evaluated by this problem before and has recently been in physical therapy in part due to her knee pain. She recently had to discontinue PT, as therapists noted that her vision was significantly affecting her performance. PT requested that patient have her vision corrected then return for the last two visits that she has covered by insurance. Patient has not yet scheduled an ophtho appt despite this. She says that she was told she only needs to call the ophtho office the morning she wants an appointment. She says she will call tomorrow morning, as she realizes she is putting herself at increased risk of a fall by not being able to see well.   Hypotension It was noted at physical therapy that patient became woozy and lightheaded when standing. Her daughter has also noticed at home that she seems off-balance when she wakes up in the middle of the night to go to the bathroom. At PT, patient was found to have orthostatic hypotension. She is currently taking propranolol for HTN, and thinks that stopping this medication may be helpful.   Diabetes mellitus Patient with A1C of 7.6 today up from 6.7 at last visit 7 months ago. Patient reports that she has been eating more desserts between then and now, which she thinks may be reason for increased A1C. Is taking metformin 1000mg  BID daily. Measures fasting CBG daily, and usually gets values in mid 100s. Highest blood sugar has been 230. Lowest 160. Sometimes measures her blood sugar before eating dinner and it is about the same. Eats 5-6 small scoops of ice cream every single night. Thinks that she could cut back on ice cream to try to achieve better glycemic control. Still has not seen ophtho (see above).   Smoking status  reviewed. Patient is never smoker.   Review of Systems See HPI.     Objective:  Physical Exam  Constitutional: She is oriented to person, place, and time and well-developed, well-nourished, and in no distress.  HENT:  Head: Normocephalic and atraumatic.  Eyes: Conjunctivae and EOM are normal. Right eye exhibits no discharge. Left eye exhibits no discharge.  Pulmonary/Chest: Effort normal. No respiratory distress.  Musculoskeletal:  Swelling of R knee with possible effusion. No erythema. Mild TTP, primarily over patella. Full ROM. No crepitus. Able to walk, sit, and stand without assistance.   Neurological: She is alert and oriented to person, place, and time.  Psychiatric: Affect and judgment normal.      Assessment & Plan:  Diabetes mellitus type 2, uncomplicated Worsening. A1C increased from 6.7 in 05/2016 to 7.6 today. Patient thinks due to increased ice cream consumption. Discussed dietary changes before adding new medication. Patient came up with goal of decreasing nightly ice cream from 5-6 scoops to 2-3 scoops. Will make change and f/u in 3 months. If no improvement in A1C then, will add additional agent.  - Continue metformin 1000mg  BID - Decrease ice cream to 2-3 scoops, however stressed that no ice cream is best - F/u in 3 months for repeat A1C - Encouraged to write down blood sugars and bring to next visit  Orthostatic hypotension Patient with recurrent episodes of orthostatic hypotension measured during physical  therapy sessions. Also with reported dizziness after getting out of bed at night at home. BP 108/64 in office today. Given patient's multiple recent falls and further increased risk of falls with episodes of hypotension, will discontinue propranolol.  - D/c propranolol - F/u BP at next appt   Adin Hector, MD, MPH PGY-2 [facility] Pager [phone]

## 2016-11-20 NOTE — Assessment & Plan Note (Signed)
Patient with recurrent episodes of orthostatic hypotension measured during physical therapy sessions. Also with reported dizziness after getting out of bed at night at home. BP 108/64 in office today. Given patient's multiple recent falls and further increased risk of falls with episodes of hypotension, will discontinue propranolol.  - D/c propranolol - F/u BP at next appt

## 2016-11-20 NOTE — Patient Instructions (Addendum)
It was nice seeing you again today Ms. Lesmeister!  Please stop taking propranolol today. Hopefully this will help prevent your blood pressure from dropping when standing up.   I have placed a referral to sports medicine for you to evaluate and possibly drain your knee.   Your A1C has increased to 7.6 today. You agreed to make some diet changes to try to decrease this so we do not have to add another medication. The goal you set was: - Only eat 2-3 small scoops of ice cream a night (however less than that or no ice cream at all is best).   We will see you back in three months to check your A1C again. If it is still high, we will need to start another medicine.   It is VERY IMPORTANT to see the eye doctor as soon as possible. Please call tomorrow to try to be seen then.   If you have any questions or concerns, please feel free to call the clinic.   Be well,  Dr. Avon Gully

## 2016-11-27 ENCOUNTER — Other Ambulatory Visit: Payer: Self-pay | Admitting: Internal Medicine

## 2016-11-27 DIAGNOSIS — M47896 Other spondylosis, lumbar region: Secondary | ICD-10-CM

## 2016-11-27 DIAGNOSIS — G894 Chronic pain syndrome: Secondary | ICD-10-CM

## 2016-12-05 ENCOUNTER — Ambulatory Visit: Payer: Medicare HMO | Admitting: Family Medicine

## 2016-12-24 ENCOUNTER — Other Ambulatory Visit: Payer: Self-pay | Admitting: Internal Medicine

## 2016-12-24 DIAGNOSIS — E119 Type 2 diabetes mellitus without complications: Secondary | ICD-10-CM

## 2017-01-08 ENCOUNTER — Other Ambulatory Visit: Payer: Self-pay | Admitting: Internal Medicine

## 2017-01-08 DIAGNOSIS — H8110 Benign paroxysmal vertigo, unspecified ear: Secondary | ICD-10-CM

## 2017-01-08 DIAGNOSIS — M47896 Other spondylosis, lumbar region: Secondary | ICD-10-CM

## 2017-01-08 DIAGNOSIS — G894 Chronic pain syndrome: Secondary | ICD-10-CM

## 2017-01-08 DIAGNOSIS — R112 Nausea with vomiting, unspecified: Secondary | ICD-10-CM

## 2017-01-08 DIAGNOSIS — L309 Dermatitis, unspecified: Secondary | ICD-10-CM

## 2017-01-27 ENCOUNTER — Other Ambulatory Visit: Payer: Self-pay | Admitting: Internal Medicine

## 2017-03-16 ENCOUNTER — Other Ambulatory Visit: Payer: Self-pay | Admitting: Internal Medicine

## 2017-03-16 DIAGNOSIS — G894 Chronic pain syndrome: Secondary | ICD-10-CM

## 2017-03-16 DIAGNOSIS — F39 Unspecified mood [affective] disorder: Secondary | ICD-10-CM

## 2017-03-24 LAB — HM DIABETES EYE EXAM

## 2017-03-26 ENCOUNTER — Other Ambulatory Visit: Payer: Self-pay | Admitting: Internal Medicine

## 2017-04-20 ENCOUNTER — Encounter: Payer: Self-pay | Admitting: Internal Medicine

## 2017-04-29 ENCOUNTER — Encounter: Payer: Self-pay | Admitting: *Deleted

## 2017-05-01 NOTE — Discharge Instructions (Signed)

## 2017-05-05 ENCOUNTER — Other Ambulatory Visit: Payer: Self-pay | Admitting: Internal Medicine

## 2017-05-06 ENCOUNTER — Ambulatory Visit
Admission: RE | Admit: 2017-05-06 | Discharge: 2017-05-06 | Disposition: A | Discharge status: Home / Self Care | Payer: Medicare HMO | Source: Ambulatory Visit | Attending: Ophthalmology | Admitting: Ophthalmology

## 2017-05-06 ENCOUNTER — Encounter
Admission: RE | Disposition: A | Discharge status: Home / Self Care | Payer: Self-pay | Source: Ambulatory Visit | Attending: Ophthalmology

## 2017-05-06 ENCOUNTER — Ambulatory Visit: Payer: Medicare HMO | Admitting: Anesthesiology

## 2017-05-06 DIAGNOSIS — N312 Flaccid neuropathic bladder, not elsewhere classified: Secondary | ICD-10-CM | POA: Insufficient documentation

## 2017-05-06 DIAGNOSIS — Z79899 Other long term (current) drug therapy: Secondary | ICD-10-CM | POA: Insufficient documentation

## 2017-05-06 DIAGNOSIS — K219 Gastro-esophageal reflux disease without esophagitis: Secondary | ICD-10-CM | POA: Insufficient documentation

## 2017-05-06 DIAGNOSIS — Z886 Allergy status to analgesic agent status: Secondary | ICD-10-CM | POA: Insufficient documentation

## 2017-05-06 DIAGNOSIS — Z7982 Long term (current) use of aspirin: Secondary | ICD-10-CM | POA: Diagnosis not present

## 2017-05-06 DIAGNOSIS — G250 Essential tremor: Secondary | ICD-10-CM | POA: Insufficient documentation

## 2017-05-06 DIAGNOSIS — Z90710 Acquired absence of both cervix and uterus: Secondary | ICD-10-CM | POA: Insufficient documentation

## 2017-05-06 DIAGNOSIS — M810 Age-related osteoporosis without current pathological fracture: Secondary | ICD-10-CM | POA: Insufficient documentation

## 2017-05-06 DIAGNOSIS — M1990 Unspecified osteoarthritis, unspecified site: Secondary | ICD-10-CM | POA: Insufficient documentation

## 2017-05-06 DIAGNOSIS — Z7984 Long term (current) use of oral hypoglycemic drugs: Secondary | ICD-10-CM | POA: Diagnosis not present

## 2017-05-06 DIAGNOSIS — I10 Essential (primary) hypertension: Secondary | ICD-10-CM | POA: Insufficient documentation

## 2017-05-06 DIAGNOSIS — H2512 Age-related nuclear cataract, left eye: Secondary | ICD-10-CM | POA: Diagnosis present

## 2017-05-06 DIAGNOSIS — Z885 Allergy status to narcotic agent status: Secondary | ICD-10-CM | POA: Insufficient documentation

## 2017-05-06 DIAGNOSIS — E7800 Pure hypercholesterolemia, unspecified: Secondary | ICD-10-CM | POA: Insufficient documentation

## 2017-05-06 DIAGNOSIS — E119 Type 2 diabetes mellitus without complications: Secondary | ICD-10-CM | POA: Diagnosis not present

## 2017-05-06 HISTORY — PX: CATARACT EXTRACTION W/PHACO: SHX586

## 2017-05-06 HISTORY — DX: Essential (primary) hypertension: I10

## 2017-05-06 HISTORY — DX: Type 2 diabetes mellitus without complications: E11.9

## 2017-05-06 LAB — GLUCOSE, CAPILLARY
GLUCOSE-CAPILLARY: 131 mg/dL — AB (ref 65–99)
Glucose-Capillary: 131 mg/dL — ABNORMAL HIGH (ref 65–99)

## 2017-05-06 SURGERY — PHACOEMULSIFICATION, CATARACT, WITH IOL INSERTION
Anesthesia: General | Laterality: Left | Wound class: Clean

## 2017-05-06 MED ORDER — FENTANYL CITRATE (PF) 100 MCG/2ML IJ SOLN: 25.0000 ug | INTRAMUSCULAR | Status: DC | PRN

## 2017-05-06 MED ORDER — LACTATED RINGERS IV SOLN: 10.0000 mL/h | INTRAVENOUS | Status: DC

## 2017-05-06 MED ORDER — OXYCODONE HCL 5 MG/5ML PO SOLN: 5.0000 mg | Freq: Once | ORAL | Status: DC | PRN

## 2017-05-06 MED ORDER — CEFUROXIME OPHTHALMIC INJECTION 1 MG/0.1 ML
INJECTION | OPHTHALMIC | Status: DC | PRN
  Administered 2017-05-06: 0.1 mL via INTRACAMERAL

## 2017-05-06 MED ORDER — LIDOCAINE HCL (PF) 2 % IJ SOLN
INTRAMUSCULAR | Status: DC | PRN
  Administered 2017-05-06: 1 mL via INTRAMUSCULAR

## 2017-05-06 MED ORDER — NA HYALUR & NA CHOND-NA HYALUR 0.4-0.35 ML IO KIT
PACK | INTRAOCULAR | Status: DC | PRN
  Administered 2017-05-06: 1 mL via INTRAOCULAR

## 2017-05-06 MED ORDER — PROMETHAZINE HCL 25 MG/ML IJ SOLN: 6.2500 mg | INTRAMUSCULAR | Status: DC | PRN

## 2017-05-06 MED ORDER — OXYCODONE HCL 5 MG PO TABS: 5.0000 mg | ORAL_TABLET | Freq: Once | ORAL | Status: DC | PRN

## 2017-05-06 MED ORDER — BRIMONIDINE TARTRATE-TIMOLOL 0.2-0.5 % OP SOLN
OPHTHALMIC | Status: DC | PRN
  Administered 2017-05-06: 1 [drp] via OPHTHALMIC

## 2017-05-06 MED ORDER — MOXIFLOXACIN HCL 0.5 % OP SOLN
1.0000 [drp] | OPHTHALMIC | Status: DC | PRN
  Administered 2017-05-06 (×3): 1 [drp] via OPHTHALMIC

## 2017-05-06 MED ORDER — EPINEPHRINE PF 1 MG/ML IJ SOLN
INTRAOCULAR | Status: DC | PRN
  Administered 2017-05-06: 67 mL via OPHTHALMIC

## 2017-05-06 MED ORDER — MEPERIDINE HCL 25 MG/ML IJ SOLN: 6.2500 mg | INTRAMUSCULAR | Status: DC | PRN

## 2017-05-06 MED ORDER — LACTATED RINGERS IV SOLN: 1000.0000 mL | INTRAVENOUS | Status: DC

## 2017-05-06 MED ORDER — MIDAZOLAM HCL 2 MG/2ML IJ SOLN
INTRAMUSCULAR | Status: DC | PRN
Start: 2017-05-06 — End: 2017-05-06
  Administered 2017-05-06: 2 mg via INTRAVENOUS

## 2017-05-06 MED ORDER — ARMC OPHTHALMIC DILATING DROPS
1.0000 "application " | OPHTHALMIC | Status: DC | PRN
  Administered 2017-05-06 (×3): 1 via OPHTHALMIC

## 2017-05-06 MED ORDER — FENTANYL CITRATE (PF) 100 MCG/2ML IJ SOLN
INTRAMUSCULAR | Status: DC | PRN
  Administered 2017-05-06: 50 ug via INTRAVENOUS

## 2017-05-06 SURGICAL SUPPLY — 25 items
CANNULA ANT/CHMB 27GA (MISCELLANEOUS) ×2 IMPLANT
CARTRIDGE ABBOTT (MISCELLANEOUS) IMPLANT
GLOVE SURG LX 7.5 STRW (GLOVE) ×1
GLOVE SURG LX STRL 7.5 STRW (GLOVE) ×1 IMPLANT
GLOVE SURG TRIUMPH 8.0 PF LTX (GLOVE) ×2 IMPLANT
GOWN STRL REUS W/ TWL LRG LVL3 (GOWN DISPOSABLE) ×2 IMPLANT
GOWN STRL REUS W/TWL LRG LVL3 (GOWN DISPOSABLE) ×2
LENS IOL TECNIS ITEC 22.0 (Intraocular Lens) ×2 IMPLANT
MARKER SKIN DUAL TIP RULER LAB (MISCELLANEOUS) ×2 IMPLANT
NDL RETROBULBAR .5 NSTRL (NEEDLE) IMPLANT
NEEDLE FILTER BLUNT 18X 1/2SAF (NEEDLE) ×1
NEEDLE FILTER BLUNT 18X1 1/2 (NEEDLE) ×1 IMPLANT
PACK CATARACT BRASINGTON (MISCELLANEOUS) ×2 IMPLANT
PACK EYE AFTER SURG (MISCELLANEOUS) ×2 IMPLANT
PACK OPTHALMIC (MISCELLANEOUS) ×2 IMPLANT
RING MALYGIN 7.0 (MISCELLANEOUS) IMPLANT
SUT ETHILON 10-0 CS-B-6CS-B-6 (SUTURE)
SUT VICRYL  9 0 (SUTURE)
SUT VICRYL 9 0 (SUTURE) IMPLANT
SUTURE EHLN 10-0 CS-B-6CS-B-6 (SUTURE) IMPLANT
SYR 3ML LL SCALE MARK (SYRINGE) ×2 IMPLANT
SYR 5ML LL (SYRINGE) ×2 IMPLANT
SYR TB 1ML LUER SLIP (SYRINGE) ×2 IMPLANT
WATER STERILE IRR 250ML POUR (IV SOLUTION) ×2 IMPLANT
WIPE NON LINTING 3.25X3.25 (MISCELLANEOUS) ×2 IMPLANT

## 2017-05-06 NOTE — Op Note (Signed)
OPERATIVE NOTE  [name] [id number] 05/06/2017   PREOPERATIVE DIAGNOSIS:  Nuclear sclerotic cataract left eye. H25.12   POSTOPERATIVE DIAGNOSIS:    Nuclear sclerotic cataract left eye.     PROCEDURE:  Phacoemusification with posterior chamber intraocular lens placement of the left eye   LENS:   Implant Name Type Inv. Item Serial No. Manufacturer Lot No. LRB No. Used  LENS IOL DIOP 22.0 - [id number] Intraocular Lens LENS IOL DIOP 22.0 [id number] AMO   Left 1        ULTRASOUND TIME: 21  % of 1 minutes 18 seconds, CDE 16.4  SURGEON:  Wyonia Hough, MD   ANESTHESIA:  Topical with tetracaine drops and 2% Xylocaine jelly, augmented with 1% preservative-free intracameral lidocaine.    COMPLICATIONS:  None.   DESCRIPTION OF PROCEDURE:  The patient was identified in the holding room and transported to the operating room and placed in the supine position under the operating microscope.  The left eye was identified as the operative eye and it was prepped and draped in the usual sterile ophthalmic fashion.   A 1 millimeter clear-corneal paracentesis was made at the 1:30 position.  0.5 ml of preservative-free 1% lidocaine was injected into the anterior chamber.  The anterior chamber was filled with Viscoat viscoelastic.  A 2.4 millimeter keratome was used to make a near-clear corneal incision at the 10:30 position.  .  A curvilinear capsulorrhexis was made with a cystotome and capsulorrhexis forceps.  Balanced salt solution was used to hydrodissect and hydrodelineate the nucleus.   Phacoemulsification was then used in stop and chop fashion to remove the lens nucleus and epinucleus.  The remaining cortex was then removed using the irrigation and aspiration handpiece. Provisc was then placed into the capsular bag to distend it for lens placement.  A lens was then injected into the capsular bag.  The remaining viscoelastic was aspirated.   Wounds were hydrated with balanced salt  solution.  The anterior chamber was inflated to a physiologic pressure with balanced salt solution.  No wound leaks were noted. Cefuroxime 0.1 ml of a 10mg /ml solution was injected into the anterior chamber for a dose of 1 mg of intracameral antibiotic at the completion of the case.   Timolol and Brimonidine drops were applied to the eye.  The patient was taken to the recovery room in stable condition without complications of anesthesia or surgery.  [name] 05/06/2017, 8:04 AM

## 2017-05-06 NOTE — Transfer of Care (Signed)
Immediate Anesthesia Transfer of Care Note  Patient: [name]  Procedure(s) Performed: CATARACT EXTRACTION PHACO AND INTRAOCULAR LENS PLACEMENT (IOC) LEFT DIABETIC (Left )  Patient Location: PACU  Anesthesia Type: General  Level of Consciousness: awake, alert  and patient cooperative  Airway and Oxygen Therapy: Patient Spontanous Breathing and Patient connected to supplemental oxygen  Post-op Assessment: Post-op Vital signs reviewed, Patient's Cardiovascular Status Stable, Respiratory Function Stable, Patent Airway and No signs of Nausea or vomiting  Post-op Vital Signs: Reviewed and stable  Complications: No apparent anesthesia complications

## 2017-05-06 NOTE — H&P (Signed)
The History and Physical notes are on paper, have been signed, and are to be scanned. The patient remains stable and unchanged from the H&P.   Previous H&P reviewed, patient examined, and there are no changes.  [name] 05/06/2017 7:40 AM

## 2017-05-06 NOTE — Anesthesia Postprocedure Evaluation (Signed)
Anesthesia Post Note  Patient: [name]  Procedure(s) Performed: CATARACT EXTRACTION PHACO AND INTRAOCULAR LENS PLACEMENT (IOC) LEFT DIABETIC (Left )  Patient location during evaluation: PACU Anesthesia Type: General Level of consciousness: awake and alert Pain management: pain level controlled Vital Signs Assessment: post-procedure vital signs reviewed and stable Respiratory status: spontaneous breathing, nonlabored ventilation, respiratory function stable and patient connected to nasal cannula oxygen Cardiovascular status: blood pressure returned to baseline and stable Postop Assessment: no apparent nausea or vomiting Anesthetic complications: no    SCOURAS, NICOLE ELAINE

## 2017-05-06 NOTE — Anesthesia Procedure Notes (Signed)
Procedure Name: MAC Performed by: Helmuth Recupero Pre-anesthesia Checklist: Patient identified, Emergency Drugs available, Suction available, Timeout performed and Patient being monitored Patient Re-evaluated:Patient Re-evaluated prior to inductionOxygen Delivery Method: Nasal cannula Placement Confirmation: positive ETCO2       

## 2017-05-06 NOTE — Anesthesia Preprocedure Evaluation (Addendum)
Anesthesia Evaluation  Patient identified by MRN, date of birth, ID band Patient awake    Reviewed: Allergy & Precautions, H&P , NPO status , Patient's Chart, lab work & pertinent test results, reviewed documented beta blocker date and time   Airway Mallampati: II  TM Distance: >3 FB Neck ROM: full    Dental no notable dental hx.    Pulmonary neg pulmonary ROS,    Pulmonary exam normal breath sounds clear to auscultation       Cardiovascular Exercise Tolerance: Good hypertension, negative cardio ROS   Rhythm:regular Rate:Normal     Neuro/Psych B,adder atony CVA negative psych ROS   GI/Hepatic negative GI ROS, Neg liver ROS, GERD  ,  Endo/Other  negative endocrine ROSdiabetes  Renal/GU Renal disease  negative genitourinary   Musculoskeletal   Abdominal   Peds  Hematology negative hematology ROS (+)   Anesthesia Other Findings BMI 41  Reproductive/Obstetrics negative OB ROS                             Anesthesia Physical Anesthesia Plan  ASA: III  Anesthesia Plan: MAC   Post-op Pain Management:    Induction:   PONV Risk Score and Plan:   Airway Management Planned:   Additional Equipment:   Intra-op Plan:   Post-operative Plan:   Informed Consent: I have reviewed the patients History and Physical, chart, labs and discussed the procedure including the risks, benefits and alternatives for the proposed anesthesia with the patient or authorized representative who has indicated his/her understanding and acceptance.   Dental Advisory Given  Plan Discussed with: CRNA  Anesthesia Plan Comments:        Anesthesia Quick Evaluation

## 2017-05-07 ENCOUNTER — Encounter: Payer: Self-pay | Admitting: Ophthalmology

## 2017-05-13 ENCOUNTER — Other Ambulatory Visit: Payer: Self-pay | Admitting: *Deleted

## 2017-05-13 DIAGNOSIS — E119 Type 2 diabetes mellitus without complications: Secondary | ICD-10-CM

## 2017-05-13 NOTE — Telephone Encounter (Signed)
Daughter-in-law left message on nurse line requesting refill on metformin at [facility]. States patient has less than one week's left of metformin. Hubbard Hartshorn, RN, BSN

## 2017-05-14 MED ORDER — METFORMIN HCL 1000 MG PO TABS: 1000.0000 mg | ORAL_TABLET | Freq: Two times a day (BID) | ORAL | 3 refills | Status: DC

## 2017-05-21 NOTE — Telephone Encounter (Signed)
Daughter in law states the metformin has never been received and [employer] says they have never received the prescription. For some reason [employer] keeps sending propanol instead.  Please resend Rx and let [employer] know she no longer takes propanol.

## 2017-05-22 MED ORDER — METFORMIN HCL 1000 MG PO TABS: 1000.0000 mg | ORAL_TABLET | Freq: Two times a day (BID) | ORAL | 3 refills | Status: DC

## 2017-05-22 NOTE — Telephone Encounter (Signed)
Metformin refill sent in on 10/25. As patient says she has not received, sent in another refill today.   Adin Hector, MD, MPH PGY-3 [facility] Pager [phone]

## 2017-05-22 NOTE — Addendum Note (Signed)
Addended by: Adin Hector on: 05/22/2017 09:30 AM   Modules accepted: Orders

## 2017-05-27 NOTE — Telephone Encounter (Signed)
Pt daughter informed. Deseree Blount, CMA  

## 2017-06-02 ENCOUNTER — Encounter: Payer: Self-pay | Admitting: *Deleted

## 2017-06-02 NOTE — Discharge Instructions (Signed)

## 2017-06-03 ENCOUNTER — Encounter
Admission: RE | Disposition: A | Discharge status: Home / Self Care | Payer: Self-pay | Source: Ambulatory Visit | Attending: Ophthalmology

## 2017-06-03 ENCOUNTER — Ambulatory Visit: Payer: Medicare HMO | Admitting: Anesthesiology

## 2017-06-03 ENCOUNTER — Ambulatory Visit
Admission: RE | Admit: 2017-06-03 | Discharge: 2017-06-03 | Disposition: A | Discharge status: Home / Self Care | Payer: Medicare HMO | Source: Ambulatory Visit | Attending: Ophthalmology | Admitting: Ophthalmology

## 2017-06-03 DIAGNOSIS — E7800 Pure hypercholesterolemia, unspecified: Secondary | ICD-10-CM | POA: Diagnosis not present

## 2017-06-03 DIAGNOSIS — H2511 Age-related nuclear cataract, right eye: Secondary | ICD-10-CM | POA: Insufficient documentation

## 2017-06-03 DIAGNOSIS — R609 Edema, unspecified: Secondary | ICD-10-CM | POA: Insufficient documentation

## 2017-06-03 DIAGNOSIS — K449 Diaphragmatic hernia without obstruction or gangrene: Secondary | ICD-10-CM | POA: Insufficient documentation

## 2017-06-03 DIAGNOSIS — E049 Nontoxic goiter, unspecified: Secondary | ICD-10-CM | POA: Diagnosis not present

## 2017-06-03 DIAGNOSIS — Z9849 Cataract extraction status, unspecified eye: Secondary | ICD-10-CM | POA: Insufficient documentation

## 2017-06-03 DIAGNOSIS — E1136 Type 2 diabetes mellitus with diabetic cataract: Secondary | ICD-10-CM | POA: Insufficient documentation

## 2017-06-03 DIAGNOSIS — K219 Gastro-esophageal reflux disease without esophagitis: Secondary | ICD-10-CM | POA: Insufficient documentation

## 2017-06-03 DIAGNOSIS — Z90710 Acquired absence of both cervix and uterus: Secondary | ICD-10-CM | POA: Insufficient documentation

## 2017-06-03 DIAGNOSIS — M1990 Unspecified osteoarthritis, unspecified site: Secondary | ICD-10-CM | POA: Insufficient documentation

## 2017-06-03 DIAGNOSIS — Z9049 Acquired absence of other specified parts of digestive tract: Secondary | ICD-10-CM | POA: Insufficient documentation

## 2017-06-03 DIAGNOSIS — I10 Essential (primary) hypertension: Secondary | ICD-10-CM | POA: Diagnosis not present

## 2017-06-03 DIAGNOSIS — M810 Age-related osteoporosis without current pathological fracture: Secondary | ICD-10-CM | POA: Insufficient documentation

## 2017-06-03 DIAGNOSIS — R0601 Orthopnea: Secondary | ICD-10-CM | POA: Insufficient documentation

## 2017-06-03 DIAGNOSIS — R251 Tremor, unspecified: Secondary | ICD-10-CM | POA: Insufficient documentation

## 2017-06-03 DIAGNOSIS — Z8673 Personal history of transient ischemic attack (TIA), and cerebral infarction without residual deficits: Secondary | ICD-10-CM | POA: Insufficient documentation

## 2017-06-03 DIAGNOSIS — Z885 Allergy status to narcotic agent status: Secondary | ICD-10-CM | POA: Diagnosis not present

## 2017-06-03 HISTORY — PX: CATARACT EXTRACTION W/PHACO: SHX586

## 2017-06-03 LAB — GLUCOSE, CAPILLARY
GLUCOSE-CAPILLARY: 148 mg/dL — AB (ref 65–99)
Glucose-Capillary: 127 mg/dL — ABNORMAL HIGH (ref 65–99)

## 2017-06-03 SURGERY — PHACOEMULSIFICATION, CATARACT, WITH IOL INSERTION
Anesthesia: Monitor Anesthesia Care | Laterality: Right | Wound class: Clean

## 2017-06-03 MED ORDER — EPINEPHRINE PF 1 MG/ML IJ SOLN
INTRAMUSCULAR | Status: DC | PRN
  Administered 2017-06-03: 52 mL via OPHTHALMIC

## 2017-06-03 MED ORDER — MOXIFLOXACIN HCL 0.5 % OP SOLN
1.0000 [drp] | OPHTHALMIC | Status: DC | PRN
  Administered 2017-06-03 (×3): 1 [drp] via OPHTHALMIC

## 2017-06-03 MED ORDER — LACTATED RINGERS IV SOLN: 10.0000 mL/h | INTRAVENOUS | Status: DC

## 2017-06-03 MED ORDER — BALANCED SALT IO SOLN
INTRAOCULAR | Status: DC | PRN
  Administered 2017-06-03: 1 mL

## 2017-06-03 MED ORDER — FENTANYL CITRATE (PF) 100 MCG/2ML IJ SOLN
INTRAMUSCULAR | Status: DC | PRN
  Administered 2017-06-03: 50 ug via INTRAVENOUS

## 2017-06-03 MED ORDER — ARMC OPHTHALMIC DILATING DROPS
1.0000 "application " | OPHTHALMIC | Status: DC | PRN
  Administered 2017-06-03 (×3): 1 via OPHTHALMIC

## 2017-06-03 MED ORDER — MIDAZOLAM HCL 2 MG/2ML IJ SOLN
INTRAMUSCULAR | Status: DC | PRN
  Administered 2017-06-03: 1.5 mg via INTRAVENOUS

## 2017-06-03 MED ORDER — CEFUROXIME OPHTHALMIC INJECTION 1 MG/0.1 ML
INJECTION | OPHTHALMIC | Status: DC | PRN
  Administered 2017-06-03: 0.1 mL via INTRACAMERAL

## 2017-06-03 MED ORDER — NA HYALUR & NA CHOND-NA HYALUR 0.4-0.35 ML IO KIT
PACK | INTRAOCULAR | Status: DC | PRN
  Administered 2017-06-03: 1 mL via INTRAOCULAR

## 2017-06-03 MED ORDER — BRIMONIDINE TARTRATE-TIMOLOL 0.2-0.5 % OP SOLN
OPHTHALMIC | Status: DC | PRN
  Administered 2017-06-03: 1 [drp] via OPHTHALMIC

## 2017-06-03 SURGICAL SUPPLY — 25 items
CANNULA ANT/CHMB 27GA (MISCELLANEOUS) ×2 IMPLANT
CARTRIDGE ABBOTT (MISCELLANEOUS) IMPLANT
GLOVE SURG LX 7.5 STRW (GLOVE) ×1
GLOVE SURG LX STRL 7.5 STRW (GLOVE) ×1 IMPLANT
GLOVE SURG TRIUMPH 8.0 PF LTX (GLOVE) ×2 IMPLANT
GOWN STRL REUS W/ TWL LRG LVL3 (GOWN DISPOSABLE) ×2 IMPLANT
GOWN STRL REUS W/TWL LRG LVL3 (GOWN DISPOSABLE) ×2
LENS IOL TECNIS ITEC 22.0 (Intraocular Lens) ×2 IMPLANT
MARKER SKIN DUAL TIP RULER LAB (MISCELLANEOUS) ×2 IMPLANT
NDL RETROBULBAR .5 NSTRL (NEEDLE) IMPLANT
NEEDLE FILTER BLUNT 18X 1/2SAF (NEEDLE) ×1
NEEDLE FILTER BLUNT 18X1 1/2 (NEEDLE) ×1 IMPLANT
PACK CATARACT BRASINGTON (MISCELLANEOUS) ×2 IMPLANT
PACK EYE AFTER SURG (MISCELLANEOUS) ×2 IMPLANT
PACK OPTHALMIC (MISCELLANEOUS) ×2 IMPLANT
RING MALYGIN 7.0 (MISCELLANEOUS) IMPLANT
SUT ETHILON 10-0 CS-B-6CS-B-6 (SUTURE)
SUT VICRYL  9 0 (SUTURE)
SUT VICRYL 9 0 (SUTURE) IMPLANT
SUTURE EHLN 10-0 CS-B-6CS-B-6 (SUTURE) IMPLANT
SYR 3ML LL SCALE MARK (SYRINGE) ×2 IMPLANT
SYR 5ML LL (SYRINGE) ×2 IMPLANT
SYR TB 1ML LUER SLIP (SYRINGE) ×2 IMPLANT
WATER STERILE IRR 250ML POUR (IV SOLUTION) ×2 IMPLANT
WIPE NON LINTING 3.25X3.25 (MISCELLANEOUS) ×2 IMPLANT

## 2017-06-03 NOTE — H&P (Signed)
The History and Physical notes are on paper, have been signed, and are to be scanned. The patient remains stable and unchanged from the H&P.   Previous H&P reviewed, patient examined, and there are no changes.  [name] 06/03/2017 7:46 AM

## 2017-06-03 NOTE — Op Note (Signed)
LOCATION:  [facility]   PREOPERATIVE DIAGNOSIS:    Nuclear sclerotic cataract right eye. H25.11   POSTOPERATIVE DIAGNOSIS:  Nuclear sclerotic cataract right eye.     PROCEDURE:  Phacoemusification with posterior chamber intraocular lens placement of the right eye   LENS:   Implant Name Type Inv. Item Serial No. Manufacturer Lot No. LRB No. Used  LENS IOL DIOP 22.0 - [id number] Intraocular Lens LENS IOL DIOP 22.0 [id number]  Right 1        ULTRASOUND TIME: 10 % of 1 minutes, 4 seconds.  CDE 6.7   SURGEON:  Wyonia Hough, MD   ANESTHESIA:  Topical with tetracaine drops and 2% Xylocaine jelly, augmented with 1% preservative-free intracameral lidocaine.    COMPLICATIONS:  None.   DESCRIPTION OF PROCEDURE:  The patient was identified in the holding room and transported to the operating room and placed in the supine position under the operating microscope.  The right eye was identified as the operative eye and it was prepped and draped in the usual sterile ophthalmic fashion.   A 1 millimeter clear-corneal paracentesis was made at the 12:00 position.  0.5 ml of preservative-free 1% lidocaine was injected into the anterior chamber. The anterior chamber was filled with Viscoat viscoelastic.  A 2.4 millimeter keratome was used to make a near-clear corneal incision at the 9:00 position.  A curvilinear capsulorrhexis was made with a cystotome and capsulorrhexis forceps.  Balanced salt solution was used to hydrodissect and hydrodelineate the nucleus.   Phacoemulsification was then used in stop and chop fashion to remove the lens nucleus and epinucleus.  The remaining cortex was then removed using the irrigation and aspiration handpiece. Provisc was then placed into the capsular bag to distend it for lens placement.  A lens was then injected into the capsular bag.  The remaining viscoelastic was aspirated.   Wounds were hydrated with balanced salt solution.  The anterior  chamber was inflated to a physiologic pressure with balanced salt solution.  No wound leaks were noted. Cefuroxime 0.1 ml of a 10mg /ml solution was injected into the anterior chamber for a dose of 1 mg of intracameral antibiotic at the completion of the case.   Timolol and Brimonidine drops were applied to the eye.  The patient was taken to the recovery room in stable condition without complications of anesthesia or surgery.   Daune Divirgilio 06/03/2017, 8:42 AM

## 2017-06-03 NOTE — Anesthesia Preprocedure Evaluation (Signed)
Anesthesia Evaluation  Patient identified by MRN, date of birth, ID band Patient awake    Reviewed: Allergy & Precautions, H&P , NPO status , Patient's Chart, lab work & pertinent test results  Airway    Neck ROM: full  Mouth opening: Pediatric Airway  Dental no notable dental hx.    Pulmonary neg pulmonary ROS,    Pulmonary exam normal        Cardiovascular hypertension, On Medications Normal cardiovascular exam     Neuro/Psych    GI/Hepatic Medicated,  Endo/Other  diabetes, Well Controlled, Type 2  Renal/GU   negative genitourinary   Musculoskeletal   Abdominal   Peds  Hematology   Anesthesia Other Findings   Reproductive/Obstetrics                             Anesthesia Physical Anesthesia Plan  ASA: II  Anesthesia Plan: MAC   Post-op Pain Management:    Induction:   PONV Risk Score and Plan:   Airway Management Planned:   Additional Equipment:   Intra-op Plan:   Post-operative Plan:   Informed Consent: I have reviewed the patients History and Physical, chart, labs and discussed the procedure including the risks, benefits and alternatives for the proposed anesthesia with the patient or authorized representative who has indicated his/her understanding and acceptance.     Plan Discussed with:   Anesthesia Plan Comments:         Anesthesia Quick Evaluation

## 2017-06-03 NOTE — Anesthesia Postprocedure Evaluation (Signed)
Anesthesia Post Note  Patient: [name]  Procedure(s) Performed: CATARACT EXTRACTION PHACO AND INTRAOCULAR LENS PLACEMENT (IOC) RIGHT DIABETIC (Right )  Patient location during evaluation: PACU Anesthesia Type: MAC Level of consciousness: awake and alert Pain management: pain level controlled Vital Signs Assessment: post-procedure vital signs reviewed and stable Respiratory status: spontaneous breathing Cardiovascular status: blood pressure returned to baseline Postop Assessment: no headache Anesthetic complications: no    Jaci Standard, III,  Enzio Buchler D

## 2017-06-03 NOTE — Anesthesia Procedure Notes (Signed)
Procedure Name: MAC Performed by: Crist Kruszka, CRNA Pre-anesthesia Checklist: Patient identified, Emergency Drugs available, Suction available, Timeout performed and Patient being monitored Patient Re-evaluated:Patient Re-evaluated prior to induction Oxygen Delivery Method: Nasal cannula Placement Confirmation: positive ETCO2       

## 2017-06-03 NOTE — Transfer of Care (Signed)
Immediate Anesthesia Transfer of Care Note  Patient: [name]  Procedure(s) Performed: CATARACT EXTRACTION PHACO AND INTRAOCULAR LENS PLACEMENT (IOC) RIGHT DIABETIC (Right )  Patient Location: PACU  Anesthesia Type: MAC  Level of Consciousness: awake, alert  and patient cooperative  Airway and Oxygen Therapy: Patient Spontanous Breathing and Patient connected to supplemental oxygen  Post-op Assessment: Post-op Vital signs reviewed, Patient's Cardiovascular Status Stable, Respiratory Function Stable, Patent Airway and No signs of Nausea or vomiting  Post-op Vital Signs: Reviewed and stable  Complications: No apparent anesthesia complications

## 2017-06-04 ENCOUNTER — Encounter: Payer: Self-pay | Admitting: Ophthalmology

## 2017-07-17 ENCOUNTER — Other Ambulatory Visit: Payer: Self-pay | Admitting: Internal Medicine

## 2017-07-17 DIAGNOSIS — M47816 Spondylosis without myelopathy or radiculopathy, lumbar region: Secondary | ICD-10-CM

## 2017-07-17 DIAGNOSIS — E785 Hyperlipidemia, unspecified: Principal | ICD-10-CM

## 2017-07-17 DIAGNOSIS — G894 Chronic pain syndrome: Secondary | ICD-10-CM

## 2017-07-17 DIAGNOSIS — E1169 Type 2 diabetes mellitus with other specified complication: Secondary | ICD-10-CM

## 2017-07-17 DIAGNOSIS — K219 Gastro-esophageal reflux disease without esophagitis: Secondary | ICD-10-CM

## 2017-07-17 DIAGNOSIS — M47896 Other spondylosis, lumbar region: Secondary | ICD-10-CM

## 2017-07-17 NOTE — Progress Notes (Unsigned)
Prescriptions refilled for patient, however she has not been seen since 11/2016,and was supposed to f/u one month after that. Hesitant to continue prescribing Flexeril and Antivert for patient given her multiple comorbidities, especially recurrent falls. Provided one additional month of both Flexeril and Antivert, however patient needs to be evaluated in office before any additional refills will be provided.   Adin Hector, MD, MPH PGY-3 [facility] Pager [phone]

## 2017-07-22 NOTE — Progress Notes (Unsigned)
Pt informed and will call back to make an appt. Deseree Kennon Holter, CMA

## 2017-08-10 ENCOUNTER — Other Ambulatory Visit: Payer: Self-pay

## 2017-08-10 ENCOUNTER — Ambulatory Visit (INDEPENDENT_AMBULATORY_CARE_PROVIDER_SITE_OTHER): Payer: Medicare HMO | Admitting: Internal Medicine

## 2017-08-10 ENCOUNTER — Encounter: Payer: Self-pay | Admitting: Internal Medicine

## 2017-08-10 ENCOUNTER — Telehealth: Payer: Self-pay | Admitting: *Deleted

## 2017-08-10 VITALS — BP 110/60 | HR 92 | Temp 98.2°F | Ht 65.0 in | Wt 242.6 lb

## 2017-08-10 DIAGNOSIS — F39 Unspecified mood [affective] disorder: Secondary | ICD-10-CM | POA: Diagnosis not present

## 2017-08-10 DIAGNOSIS — G894 Chronic pain syndrome: Secondary | ICD-10-CM

## 2017-08-10 DIAGNOSIS — Z23 Encounter for immunization: Secondary | ICD-10-CM | POA: Diagnosis not present

## 2017-08-10 DIAGNOSIS — E785 Hyperlipidemia, unspecified: Secondary | ICD-10-CM | POA: Diagnosis not present

## 2017-08-10 DIAGNOSIS — E119 Type 2 diabetes mellitus without complications: Secondary | ICD-10-CM

## 2017-08-10 DIAGNOSIS — E1169 Type 2 diabetes mellitus with other specified complication: Secondary | ICD-10-CM

## 2017-08-10 DIAGNOSIS — R413 Other amnesia: Secondary | ICD-10-CM | POA: Insufficient documentation

## 2017-08-10 DIAGNOSIS — B353 Tinea pedis: Secondary | ICD-10-CM | POA: Diagnosis not present

## 2017-08-10 LAB — POCT GLYCOSYLATED HEMOGLOBIN (HGB A1C): HEMOGLOBIN A1C: 7.3

## 2017-08-10 MED ORDER — CLOTRIMAZOLE 1 % EX CREA: 1.0000 "application " | TOPICAL_CREAM | Freq: Two times a day (BID) | CUTANEOUS | 0 refills | Status: DC

## 2017-08-10 MED ORDER — METFORMIN HCL 1000 MG PO TABS: 1000.0000 mg | ORAL_TABLET | Freq: Two times a day (BID) | ORAL | 3 refills | Status: DC

## 2017-08-10 MED ORDER — GABAPENTIN 800 MG PO TABS: 800.0000 mg | ORAL_TABLET | Freq: Three times a day (TID) | ORAL | 3 refills | Status: DC

## 2017-08-10 MED ORDER — ATORVASTATIN CALCIUM 10 MG PO TABS: 10.0000 mg | ORAL_TABLET | Freq: Every day | ORAL | 3 refills | Status: DC

## 2017-08-10 MED ORDER — DULOXETINE HCL 60 MG PO CPEP: 60.0000 mg | ORAL_CAPSULE | Freq: Every day | ORAL | 3 refills | Status: DC

## 2017-08-10 MED ORDER — LISINOPRIL 10 MG PO TABS: 10.0000 mg | ORAL_TABLET | Freq: Every day | ORAL | 3 refills | Status: DC

## 2017-08-10 NOTE — Assessment & Plan Note (Signed)
Concern for Alzheimer's given patient's reported symptoms. No history of alcoholism or other reason to suspect hepatic encephalopathy or other hepatic etiology. Patient is taking metformin so will check for B12 deficiency, however no other reason identified for profound B12 or folate deficiency. No behavioral changes to suggest frontotemporal dementia. No signs of neuropathy or other signs of significant vascular disease, so vascular dementia less likely as well. Patient previously taking Dramamine and Flexeril which could be contributing, however reportedly is no longer taking these. Does still take duloxetine and gabapentin for chronic pain, however these are less likely to cause memory issues. Feel that patient is appropriate for more thorough work-up in geriatric clinic. Will obtain bloodwork today to rule out easily reversible causes, and patient to schedule geri appt today.  - B12, folate, TSH, CBC, CMP today - Schedule geri clinic appt - Precepted with Dr. Walker Kehr

## 2017-08-10 NOTE — Assessment & Plan Note (Signed)
Well-controlled. A1C improved from 7.6 at last visit to 7.3 today. CBGs at home appear to be generally at goal (low 100s). Seen by ophtho recently and has f/u in 5 months. No reported symptoms of neuropathy and no diabetes-related abnormalities on foot exam today.  - Continue metformin 1000mg  BID - F/u in 3 months for repeat A1C

## 2017-08-10 NOTE — Patient Instructions (Addendum)
It was nice seeing you again today [name]!  Please begin applying the clotrimazole anti-fungal cream to your toes twice a day for the next four weeks. You can also read the information below for some others ways to help cure this and prevent it from happening again.   Please continue taking metformin and your other medications as you have been.   Please go to your appointment with Dr. McDiarmid in the geriatric clinic.   If you have any questions or concerns, please feel free to call the clinic.   Be well,  Dr. [place] Gully   Athlete's Foot Athlete's foot (tinea pedis) is a fungal infection of the skin on the feet. It often occurs on the skin that is between or underneath the toes. It can also occur on the soles of the feet. The infection can spread from person to person (is contagious). Follow these instructions at home:  Apply or take over-the-counter and prescription medicines only as told by your doctor.  Keep all follow-up visits as told by your doctor. This is important.  Do not scratch your feet.  Keep your feet dry: ? Wear cotton or wool socks. Change your socks every day or if they become wet. ? Wear shoes that allow air to move around, such as sandals or canvas tennis shoes.  Wash and dry your feet: ? Every day or as told by your doctor. ? After exercising. ? Including the area between your toes.  Wear sandals in wet areas, such as locker rooms and shared showers.  Do not share any of these items: ? Towels. ? Nail clippers. ? Other personal items that touch your feet.  If you have diabetes, keep your blood sugar under control. Contact a doctor if:  You have a fever.  You have swelling, soreness, warmth, or redness in your foot.  You are not getting better with treatment.  Your symptoms get worse.  You have new symptoms. This information is not intended to replace advice given to you by your health care provider. Make sure you discuss any questions you  have with your health care provider. Document Released: 12/24/2007 Document Revised: 12/13/2015 Document Reviewed: 01/08/2015 Elsevier Interactive Patient Education  2018 [employer].

## 2017-08-10 NOTE — Telephone Encounter (Signed)
Patient left message on nurse line requesting all meds that were sent to [facility] today instead be sent to [facility]. Hubbard Hartshorn, RN, BSN

## 2017-08-10 NOTE — Telephone Encounter (Signed)
Prescription resent to [facility].   Adin Hector, MD, MPH PGY-3 [facility] Pager [phone]

## 2017-08-10 NOTE — Assessment & Plan Note (Signed)
Nail changes and peeling flaky pruritic skin most consistent with tinea pedis.  - Begin clotrimazole treatment for 4 weeks - Provided handout with other conservative measures for hastening healing and prevent recurrences

## 2017-08-10 NOTE — Progress Notes (Signed)
Subjective:   Patient: [name]       Birthdate: [birth year]       MRN: [id number]      HPI  [name] is a 62 y.o. female presenting for f/u of memory loss, Type II DM and toenail concerns.   Memory loss Patient is concerned about memory loss. Her daughter in law initially brought this to her attention, as she complained that she had to repeatedly ask patient to do something or ask her the same question multiple times. Patient has since noticed that she has difficulty remembering how to get to places she has been to many times, such as the grocery store and doctors offices. Recently she had an ophtho appt in [place] and drove to [place] instead. She denies any issues with long-term memory. Says she often also forgets what she is doing in the middle of doing the task, or goes into a room and does not know why she went in there. She is very concerned about this.   Type II DM Currently prescribed metformin 1000mg  BID. Checks blood sugar at home regularly. Normally readings in 130s. This AM elevated at 277. Lowest around 100, never <100. Denies numbness or tingling in hands or feet. Has been followed by ophtho closely recently due to cataracts. Next ophtho appt is in 5 months.   Toenail abnormality Patient is concerned that she may have a fungus involving her middle toes/toenails on both feet. Has been intermittent for past 3 months. Appears, then resolves spontaneously, then reappears. Has been soaking feet in Epsom salt and using an OTC powder but has not tried anything else. Skin around toenail is very dry and peels, and toenail itself is discolored. Also endorses itching.   Smoking status reviewed. Patient is never smoker.   Review of Systems See HPI.     Objective:  Physical Exam  Constitutional: She is oriented to person, place, and time and well-developed, well-nourished, and in no distress.  HENT:  Head: Normocephalic and atraumatic.  Pulmonary/Chest: Effort  normal. No respiratory distress.  Neurological: She is alert and oriented to person, place, and time.  Skin: Skin is warm and dry.  Toenails of third digit of feet bilaterally with greenish discoloration, primarily distally. Surrounding skin flaking and peeling. No issues with other toes or under toes.   Psychiatric: Judgment normal.   Diabetic Foot Exam - Simple   Simple Foot Form Diabetic Foot exam was performed with the following findings:  Yes 08/10/2017 11:27 AM  Visual Inspection See comments:  Yes Sensation Testing Intact to touch and monofilament testing bilaterally:  Yes Pulse Check Posterior Tibialis and Dorsalis pulse intact bilaterally:  Yes Comments Toenail changes and peeling of surrounding skin consistent with tinea pedis. No signs of skin breakdown or ulcerations. Skin otherwise warm and dry.         Assessment & Plan:  Diabetes mellitus type 2, uncomplicated Well-controlled. A1C improved from 7.6 at last visit to 7.3 today. CBGs at home appear to be generally at goal (low 100s). Seen by ophtho recently and has f/u in 5 months. No reported symptoms of neuropathy and no diabetes-related abnormalities on foot exam today.  - Continue metformin 1000mg  BID - F/u in 3 months for repeat A1C  Memory loss Concern for Alzheimer's given patient's reported symptoms. No history of alcoholism or other reason to suspect hepatic encephalopathy or other hepatic etiology. Patient is taking metformin so will check for B12 deficiency, however no other reason identified for  profound B12 or folate deficiency. No behavioral changes to suggest frontotemporal dementia. No signs of neuropathy or other signs of significant vascular disease, so vascular dementia less likely as well. Patient previously taking Dramamine and Flexeril which could be contributing, however reportedly is no longer taking these. Does still take duloxetine and gabapentin for chronic pain, however these are less likely to cause  memory issues. Feel that patient is appropriate for more thorough work-up in geriatric clinic. Will obtain bloodwork today to rule out easily reversible causes, and patient to schedule geri appt today.  - B12, folate, TSH, CBC, CMP today - Schedule geri clinic appt - Precepted with Dr. Walker Kehr  Tinea pedis Nail changes and peeling flaky pruritic skin most consistent with tinea pedis.  - Begin clotrimazole treatment for 4 weeks - Provided handout with other conservative measures for hastening healing and prevent recurrences   Adin Hector, MD, MPH PGY-3 [name] Pager [phone]

## 2017-08-11 LAB — CMP14+EGFR
A/G RATIO: 0.9 — AB (ref 1.2–2.2)
ALBUMIN: 3.6 g/dL (ref 3.6–4.8)
ALK PHOS: 92 IU/L (ref 39–117)
ALT: 24 IU/L (ref 0–32)
AST: 28 IU/L (ref 0–40)
BUN/Creatinine Ratio: 19 (ref 12–28)
BUN: 11 mg/dL (ref 8–27)
Bilirubin Total: 0.6 mg/dL (ref 0.0–1.2)
CALCIUM: 9.5 mg/dL (ref 8.7–10.3)
CO2: 26 mmol/L (ref 20–29)
CREATININE: 0.58 mg/dL (ref 0.57–1.00)
Chloride: 101 mmol/L (ref 96–106)
GFR calc Af Amer: 116 mL/min/{1.73_m2} (ref >59)
GFR, EST NON AFRICAN AMERICAN: 101 mL/min/{1.73_m2} (ref >59)
Globulin, Total: 3.9 g/dL (ref 1.5–4.5)
Glucose: 214 mg/dL — ABNORMAL HIGH (ref 65–99)
Potassium: 4.9 mmol/L (ref 3.5–5.2)
SODIUM: 138 mmol/L (ref 134–144)
Total Protein: 7.5 g/dL (ref 6.0–8.5)

## 2017-08-11 LAB — CBC
Hematocrit: 35.1 % (ref 34.0–46.6)
Hemoglobin: 11.6 g/dL (ref 11.1–15.9)
MCH: 33 pg (ref 26.6–33.0)
MCHC: 33 g/dL (ref 31.5–35.7)
MCV: 100 fL — AB (ref 79–97)
PLATELETS: 155 10*3/uL (ref 150–379)
RBC: 3.52 x10E6/uL — ABNORMAL LOW (ref 3.77–5.28)
RDW: 15.5 % — AB (ref 12.3–15.4)
WBC: 6.6 10*3/uL (ref 3.4–10.8)

## 2017-08-11 LAB — VITAMIN B12: Vitamin B-12: 489 pg/mL (ref 232–1245)

## 2017-08-11 LAB — FOLATE: Folate: >20 ng/mL (ref >3.0)

## 2017-08-11 LAB — TSH: TSH: 5.61 u[IU]/mL — AB (ref 0.450–4.500)

## 2017-08-12 NOTE — Progress Notes (Signed)
Slightly elevated TSH noted on labs obtained to identify possible causes of dementia. Could consider obtaining full thyroid panel for better assessment, however patient has appt in geriatric clinic on 02/07, so will defer decision to provider she is seeing on that day. Patient asymptomatic, so unsure if treatment would really be beneficial to patient, however further work-up could potentially shed light on possible contribution to recent memory deficits.   Adin Hector, MD, MPH PGY-3 [facility] Pager [phone]

## 2017-08-27 ENCOUNTER — Ambulatory Visit (INDEPENDENT_AMBULATORY_CARE_PROVIDER_SITE_OTHER): Payer: Medicare HMO | Admitting: Family Medicine

## 2017-08-27 ENCOUNTER — Other Ambulatory Visit: Payer: Self-pay

## 2017-08-27 ENCOUNTER — Encounter: Payer: Self-pay | Admitting: Family Medicine

## 2017-08-27 VITALS — BP 106/72 | HR 90 | Temp 98.0°F | Ht 63.0 in | Wt 242.8 lb

## 2017-08-27 DIAGNOSIS — F0390 Unspecified dementia without behavioral disturbance: Secondary | ICD-10-CM | POA: Diagnosis not present

## 2017-08-27 DIAGNOSIS — E049 Nontoxic goiter, unspecified: Secondary | ICD-10-CM

## 2017-08-27 DIAGNOSIS — R7989 Other specified abnormal findings of blood chemistry: Secondary | ICD-10-CM

## 2017-08-27 DIAGNOSIS — R0683 Snoring: Secondary | ICD-10-CM

## 2017-08-27 NOTE — Patient Instructions (Addendum)
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  She is have problems with remembering things that just happened.  She will be able to remember things from a ways back just fine. Because her memory for recent things is impaired, she will ask the same questions several times.  She truly does not remember your answer.  My recommendation is to be patient and answer her question again, without getting frustrated (if possible, she is not not doing this intentionally)  She should be allowed to drive when others are with her. It is a good idea for others to set up her medications and help if there are finance / money questions.    It would be a good idea to have someone or a group of people to have both a fiancial power of attorney and healthcare power of attorney appointed by your your mother.  This will help if and when there comes a time when your mother cannot speak for herself.  Dr McDiarmid would be glad to meet with your family to talk about dementia and what to expect.  Please let me know if I can help.   Consider coming with your mother for her next visit with Dr McDiarmid.   Medication for memory helps only a little, and it has side effects.  Dr McDiarmid usually do not recommend it unless the patient or family feel strong about trying it.

## 2017-08-27 NOTE — Progress Notes (Signed)
[facility]:   Patient is accompanied by: alone for exam.  I spoke with DIL after assessment.  Primary caregiver: son and daughter-in-law. Patient's lives with their son and daughter-in-law Patient information was obtained from patient and past medical records. History/Exam limitations: none. Primary Care Provider: Verner Mould, MD Referring provider: Dr Loni Muse. Avon Gully Reason for referral:  Chief Complaint  Patient presents with  . Memory Loss   Previous Report Reviewed: historical medical records  History Chief Complaint  Patient presents with  . Memory Loss    HPI by problems:   Cognitive impairment concern What problems with thinking are there?  memory loss and inattention  When were the changes first noticed?  about a year ago per DIL ago  Did this change occur abruptly or gradually?  gradual  How have the changes progressed since then?  gradually worsening  Does their level of alertness change throughout the day?  Falls asleep easily  Is their speech disorganized, rambling?  yes, occasionally after awakening  Has there been any tremors or abnormal movements?  no  Have they had in hallucinations or delusions:  no  Have they appeared more anxious or sad lately?  no  Do they still have interests or activities they enjor doing?  yes, enjoys taking care of children and grandchildren  How has their appetite been lately?  show no change  How has their sleep been lately?  Short sleep latency, falling asleep frequently during day, even while eating breakfast.   Problem behaviors:  irritability   Compared to 5 to 10 years ago, how is the patient at:  Problems with Judgment, e.g., problem making decisions, bad financial decisions, problems with thinking? show no change   Less interested in hobbies or previously enjoyed activities? are worsening   Trouble remembering appointments? show no change    Remembering  things about family and friends e.g. names,  occupations, birthdays, addresses?  are worsening.  Forgot name of child she frequently takes care of during the day.   Remembering things that have happened recently?  are worsening. Has left stove eye on.   Recalling conversations a few days later?  are worsening  Remembering what day and month it is? show no change  Remembering where things are usually kept?  show no change  Losing things?  show no change  Learning to use a new gadget or machine around the house,e.g., computer, microwave, remote control?  are worsening  Learning new things in general?  are worsening  Following a story in a book or on TV?  show no change  Handling money for shopping?  show no change  Handling financial matters, e.g. their pension,  dealing with the bank?  show no change, it is handled by her son and DIL  Able to cope with unexpected events?  irritable  Getting lost?  are worsening. Was driving to a place in [place] she has gone to many times and ended up in [facility]  Asking same questions repeatedly or telling  the same story repeatedly to the same person(s)?  yes, a point of stress in family.    Outpatient Encounter Medications as of 08/27/2017  Medication Sig  . ACCU-CHEK AVIVA PLUS test strip USE  TO CHECK BLOOD SUGAR THREE TIMES DAILY  . ACCU-CHEK SOFTCLIX LANCETS lancets USE  TO CHECK BLOOD SUGAR THREE TIMES DAILY  . Alcohol Swabs (B-D SINGLE USE SWABS REGULAR) PADS USE  TO  CLEAN  SKIN  BEFORE CHECKING  BLOOD SUGAR  . alendronate (FOSAMAX) 70 MG tablet TAKE 1 TABLET EVERY 7 DAYS  . aspirin EC 81 MG tablet Take 81 mg by mouth daily.  Marland Kitchen atorvastatin (LIPITOR) 10 MG tablet Take 1 tablet (10 mg total) by mouth daily.  . calcium citrate-vitamin D (CITRACAL+D) 315-200 MG-UNIT tablet Take 1 tablet by mouth daily.  . clotrimazole (LOTRIMIN) 1 % cream Apply 1 application topically 2 (two) times daily.  . cyclobenzaprine  (FLEXERIL) 10 MG tablet TAKE 1 TABLET THREE TIMES DAILY AS NEEDED FOR MUSCLE SPASMS.  Marland Kitchen docusate sodium (COLACE) 100 MG capsule Take 100 mg by mouth daily.  . DULoxetine (CYMBALTA) 60 MG capsule Take 1 capsule (60 mg total) by mouth daily.  Marland Kitchen gabapentin (NEURONTIN) 800 MG tablet Take 1 tablet (800 mg total) by mouth 3 (three) times daily.  Marland Kitchen lisinopril (PRINIVIL,ZESTRIL) 10 MG tablet Take 1 tablet (10 mg total) by mouth daily.  . meclizine (ANTIVERT) 12.5 MG tablet TAKE 1 TABLET 3  TIMES DAILY AS NEEDED FOR DIZZINESS.  . meloxicam (MOBIC) 15 MG tablet TAKE 1 TABLET EVERY DAY  . metFORMIN (GLUCOPHAGE) 1000 MG tablet Take 1 tablet (1,000 mg total) by mouth 2 (two) times daily with a meal.  . Multiple Vitamins-Minerals ([employer] SPECTRAVITE WOMENS SENIOR PO) Take by mouth daily.  Marland Kitchen omeprazole (PRILOSEC) 20 MG capsule TAKE 1 CAPSULE EVERY DAY  . ondansetron (ZOFRAN) 4 MG tablet TAKE 1 TABLET EVERY 8 HOURS AS NEEDED  FOR NAUSEA  AND  VOMITING  . Psyllium (METAMUCIL PO) Take 1 scoop by mouth daily.   No facility-administered encounter medications on file as of 08/27/2017.     History Patient Active Problem List   Diagnosis Date Noted  . Memory loss 08/10/2017  . Tinea pedis 08/10/2017  . Orthostatic hypotension 11/20/2016  . Urge incontinence 07/25/2016  . Impaired functional mobility, balance, and endurance 07/24/2016  . Falls 05/23/2016  . Warts 05/23/2016  . Chronic dental pain 02/22/2016  . 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 Disorder of sacroiliac joint 05/15/2015  . Dyslipidemia associated with type 2 diabetes mellitus ([place]) 04/26/2015  . Benign paroxysmal positional vertigo 01/24/2015  . Diabetes mellitus type 2, uncomplicated ([place]) 32/99/2426  . Vitamin D deficiency 07/11/2013  . Mood disorder ([place]) 07/11/2013  . Osteopenia 11/04/2012  . Heel pain 08/14/2011  . Atony of bladder 06/10/2010  . Uterovaginal prolapse, incomplete 06/10/2010  .  VAGINITIS, ATROPHIC 05/17/2010  . 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 Chronic pain syndrome 04/26/2010  . OBESITY, NOS 09/17/2006  . GASTROESOPHAGEAL REFLUX, NO ESOPHAGITIS 09/17/2006   Past Medical History:  Diagnosis Date  . Allergy   . Arthritis   . Diabetes mellitus, type 2 ([place])   . GERD (gastroesophageal reflux disease)   . Goiter   . Hypertension   . Kidney stone   . Memory loss   . Osteoporosis    in lower back per pt   . Plantar fasciitis   . Stroke [facility])    residual left sided weakness  . Tremors of nervous system   . Vertigo    Past Surgical History:  Procedure Laterality Date  . APPENDECTOMY    . BLADDER SURGERY    . CATARACT EXTRACTION W/PHACO Left 05/06/2017   Procedure: CATARACT EXTRACTION PHACO AND INTRAOCULAR LENS PLACEMENT ([place]) LEFT DIABETIC;  Surgeon: Leandrew Koyanagi, MD;  Location: [place];  Service: Ophthalmology;  Laterality: Left;  Diabetic - oral meds  . CATARACT  EXTRACTION W/PHACO Right 06/03/2017   Procedure: CATARACT EXTRACTION PHACO AND INTRAOCULAR LENS PLACEMENT ([place]) RIGHT DIABETIC;  Surgeon: Leandrew Koyanagi, MD;  Location: [place];  Service: Ophthalmology;  Laterality: Right;  Diabetic - oral meds  . CHOLECYSTECTOMY    . FOOT SURGERY    . TOTAL ABDOMINAL HYSTERECTOMY     Family History  Problem Relation Age of Onset  . Healthy Mother   . Heart disease Father   . Alcohol abuse Father   . Alcohol abuse Brother   . Colon polyps Neg Hx   . Colon cancer Neg Hx   . Esophageal cancer Neg Hx   . Rectal cancer Neg Hx   . Stomach cancer Neg Hx    Social History   Socioeconomic History  . Marital status: Single    Spouse name: None  . Number of children: 4  . Years of education: HS  . Highest education level: None  Social Needs  . Financial resource strain: None  . Food insecurity - worry: None  . Food insecurity - inability: None  . Transportation needs - medical: None  .  Transportation needs - non-medical: None  Occupational History  . Occupation: Unemployed  Tobacco Use  . Smoking status: Never Smoker  . Smokeless tobacco: Never Used  Substance and Sexual Activity  . Alcohol use: No  . Drug use: No  . Sexual activity: None  Other Topics Concern  . None  Social History Narrative   Lives at home alone.   Right-handed.   Rarely uses caffeine.      Basic Activities of Daily Living  AADLs IIndependent NNeeds Assistance DDependent  Bbathing xx    Ddressing xx    AAmbulation xx    TToileting xx    EEating xx     Advanced Activities of Daily Living Walk up and down a flight of stairs: yes Walk one-half mile (4 blocks)? yes Perform heavy work around the house like spring cleaning or yard work? yes   Instrumental Activities of Daily Living IADL [place]  x   Housework  x   Manage Medications   x  Manage the telephone  x   Shopping for food, clothes, Meds, etc  x   Use transportation   x  Manage Finances   x    Caregivers in home: son and daughter in Sports coach  Formal [place]  Physical Therapy: no  Occupational Therapy: no             Home Aid / Personal Care Service: no             Homemaker services: no  Caregiver Burdens: Yes, DIL is primary caretaker as her husband (pt's son) works out of town frequently for long stretches at a time.  DIL has two jobs and cares for her children and her MIL. She reports feeling overwhelmed at times.    FALLS in last five office visits:  Fall Risk  08/10/2017 07/24/2016 09/05/2015 06/18/2015 05/15/2015  Falls in the past year? Yes Yes No No No  Number falls in past yr: 2 or more 2 or more - - -  Comment - 7 or 8, known to daughter - - -  Injury with Fall? Yes Yes - - -  Comment - knee injuries, black eye - - -  Risk Factor Category  High Fall Risk High Fall Risk - - -  Risk for fall due to : - Impaired balance/gait - - -  Follow up - Falls evaluation  completed;Falls prevention discussed;Follow up appointment - - -    Health Maintenance reviewed: Immunization History  Administered Date(s) Administered  . Influenza,inj,Quad PF,6+ Mos 04/26/2015, 05/23/2016, 08/10/2017  . Pneumococcal Polysaccharide-23 01/24/2015  . Td 12/20/2002  . Tdap 01/24/2015   Health Maintenance Topics with due status: Overdue     Topic Date Due   HIV Screening 12/26/1970   MAMMOGRAM 06/04/2012   OPHTHALMOLOGY EXAM 03/24/2014      ROS Snoring very loud (+) urinary incontinence  Vital Signs Weight: 242 lb 12.8 oz (110.1 kg) Body mass index is 43.01 kg/m. Estimated Creatinine Clearance: 89.1 mL/min (by C-G formula based on SCr of 0.58 mg/dL). Body surface area is 2.21 meters squared. Vitals:   08/27/17 1548  BP: 106/72  Pulse: 90  Temp: 98 F (36.7 C)  SpO2: 94%  Weight: 242 lb 12.8 oz (110.1 kg)  Height: 5\' 3"  (1.6 m)   Wt Readings from Last 3 Encounters:  08/27/17 242 lb 12.8 oz (110.1 kg)  08/10/17 242 lb 9.6 oz (110 kg)  06/03/17 238 lb (108 kg)    Hearing Screening   125Hz  250Hz  500Hz  1000Hz  2000Hz  3000Hz  4000Hz  6000Hz  8000Hz   Right ear:   Pass Pass Pass  Pass    Left ear:   Pass Pass Pass  Pass      Visual Acuity Screening   Right eye Left eye Both eyes  Without correction: 20/50 20/50 20/30   With correction:       Physical Examination:  VS reviewed GEN: Alert, Cooperative, Groomed, NAD HEENT: PERRL; EAC bilaterally not occluded, TM's translucent with normal LM, (+) LR;                No cervical LAN, No thyromegaly, No palpable masses Gait: No significant path deviation, minimal Step-through present using cane  Psych: Normal affect/thought/speech/language   Mini-Mental State Examination or Montreal Cognitive Assessment:  Patient did  require additional cues or prompts to complete tasks. Patient was cooperative and attentive to testing tasks Patient did  appear motivated to perform well  MMSE - Mini Mental State Exam  03/29/2015 02/22/2015  Orientation to time 4 5  Orientation to Place 5 4  Registration 3 3  Attention/ Calculation 5 5  Recall 3 2  Language- name 2 objects 2 2  Language- repeat 1 1  Language- follow 3 step command 3 3  Language- read & follow direction 1 1  Write a sentence 1 1  Copy design 1 1  Total score 29 28        Montreal Cognitive Assessment  08/28/2017  Visuospatial/ Executive (0/5) 4  Naming (0/3) 1  Attention: Read list of digits (0/2) 2  Attention: Read list of letters (0/1) 1  Attention: Serial 7 subtraction starting at 100 (0/3) 1  Language: Repeat phrase (0/2) 1  Language : Fluency (0/1) 1  Abstraction (0/2) 0  Delayed Recall (0/5) 0  Orientation (0/6) 5  Total 16  Adjusted Score (based on education) 17    PHQ2: 0  Labs  Lab Results  Component Value Date   LTJQZESP23 300 08/10/2017    Lab Results  Component Value Date   FOLATE >20.0 08/10/2017    Lab Results  Component Value Date   TSH 5.610 (H) 08/10/2017    No results found for: RPR    Chemistry      Component Value Date/Time   NA 138 08/10/2017 0944   NA 137 10/21/2014 1320   K  4.9 08/10/2017 0944   K 3.7 10/21/2014 1320   CL 101 08/10/2017 0944   CL 107 10/21/2014 1320   CO2 26 08/10/2017 0944   CO2 26 10/21/2014 1320   BUN 11 08/10/2017 0944   BUN 18 10/21/2014 1320   CREATININE 0.58 08/10/2017 0944   CREATININE 0.54 10/21/2014 1320   CREATININE 0.64 04/25/2014 0919      Component Value Date/Time   CALCIUM 9.5 08/10/2017 0944   CALCIUM 9.0 10/21/2014 1320   ALKPHOS 92 08/10/2017 0944   AST 28 08/10/2017 0944   ALT 24 08/10/2017 0944   BILITOT 0.6 08/10/2017 0944       Lab Results  Component Value Date   HGBA1C 7.3 08/10/2017     @10RELATIVEDAYS @  Hearing Screening   125Hz  250Hz  500Hz  1000Hz  2000Hz  3000Hz  4000Hz  6000Hz  8000Hz   Right ear:   Pass Pass Pass  Pass    Left ear:   Pass Pass Pass  Pass      Visual Acuity Screening   Right eye Left eye Both eyes   Without correction: 20/50 20/50 20/30   With correction:      Lab Results  Component Value Date   WBC 6.6 08/10/2017   HGB 11.6 08/10/2017   HCT 35.1 08/10/2017   MCV 100 (H) 08/10/2017   PLT 155 08/10/2017    Results for orders placed or performed in visit on 08/27/17 (from the past 24 hour(s))  T4, Free   Collection Time: 08/27/17  5:13 PM  Result Value Ref Range   Free T4 0.83 0.82 - 1.77 ng/dL    Imaging Brain MRI: 02/23/2015 at [facility] with Dr Krista Blue:   Abnormal MRI scan of the brain showing scattered tiny periventricular, subcortical and central brainstem white matter nonspecific hyperintensities      Personal Strengths Supportive family/friends  Support System Strengths Supportive Relationships  Advanced Directives: Code Status: Full code  Patient to Follow up with  [facility] in 6 week(s)  60 minutes face to face where spent in total with counseling / coordination of care took more than 50% of the total time. Counseling was with patient and DIL involved discussion differential diagnosis, testing, prognosis, adherence, risk reduction, benefits of treatment, instructions, compliance.

## 2017-08-28 ENCOUNTER — Encounter: Payer: Self-pay | Admitting: Family Medicine

## 2017-08-28 DIAGNOSIS — R0683 Snoring: Secondary | ICD-10-CM | POA: Insufficient documentation

## 2017-08-28 DIAGNOSIS — R7989 Other specified abnormal findings of blood chemistry: Secondary | ICD-10-CM | POA: Insufficient documentation

## 2017-08-28 DIAGNOSIS — F0280 Dementia in other diseases classified elsewhere without behavioral disturbance: Secondary | ICD-10-CM

## 2017-08-28 DIAGNOSIS — F0150 Vascular dementia without behavioral disturbance: Secondary | ICD-10-CM | POA: Insufficient documentation

## 2017-08-28 DIAGNOSIS — G309 Alzheimer's disease, unspecified: Secondary | ICD-10-CM

## 2017-08-28 HISTORY — DX: Alzheimer's disease, unspecified: G30.9

## 2017-08-28 HISTORY — DX: Snoring: R06.83

## 2017-08-28 HISTORY — DX: Vascular dementia, unspecified severity, without behavioral disturbance, psychotic disturbance, mood disturbance, and anxiety: F02.80

## 2017-08-28 HISTORY — DX: Vascular dementia, unspecified severity, without behavioral disturbance, psychotic disturbance, mood disturbance, and anxiety: F01.50

## 2017-08-28 LAB — T4, FREE: Free T4: 0.83 ng/dL (ref 0.82–1.77)

## 2017-08-28 NOTE — Assessment & Plan Note (Signed)
Evidence of cognitive domain(s) impairment on formal testing.  Impairment(s) have shown are gradual in onset progression of symptoms according to patient and daughter in law. FAST stage of dementia estimated 4 to 5 (mild) The presence of possible OSA needs to be assessed as its presence may impair cognition functioning and cognitive testing results.  The anticholinergic medications on her med list of meclizine and cyclobenzaprine should be stopped, if they have not all ready.   Pt and family are to return to [facility] in 6 weeks to readdress/ discuss the diagnosis, prognosis, and important tasks that should be addressed.    Counseled patient and family regarding the diagnosis of dementia and progression. Provided packet of materials assembled for patients and families with more information about dementia and resources to assist with coping. All questions answered fully. Financial, legal and medical advanced planning recommended to patient and caregivers.   Patient Strengths Supportive family/friends  Support System Strengths Supportive Relationships

## 2017-08-28 NOTE — Assessment & Plan Note (Signed)
FT4 drawn today.

## 2017-08-28 NOTE — Assessment & Plan Note (Signed)
New problem Loud snoring and gasping Dr Marcial Pacas (Neuro) referred patient for Sleep study for suspect OSA, but it may have been canceled when patient said she wanted a home sleep study.  I do not see where she ever had a sleep study, at home or at a sleep lab.  Short sleep latency, e.g., falling asleep while eating breakfast per dgt in law.  This will need follow up as sleep deprivation can contribute to poor cognitive function and testing.

## 2017-11-06 ENCOUNTER — Other Ambulatory Visit: Payer: Self-pay | Admitting: Internal Medicine

## 2017-11-06 DIAGNOSIS — M47896 Other spondylosis, lumbar region: Secondary | ICD-10-CM

## 2017-11-06 DIAGNOSIS — G894 Chronic pain syndrome: Secondary | ICD-10-CM

## 2017-11-09 NOTE — Telephone Encounter (Signed)
Covering inbox for Dr. Avon Gully. These medications were refilled by PCP July 17, 2017. Will give limited refill at this time, however please call patient as she is supposed to follow up with Dr. McDiarmid in geriatric clinic for memory issues, and certainly these medications should be used with caution in elderly patients.

## 2017-11-12 ENCOUNTER — Ambulatory Visit (INDEPENDENT_AMBULATORY_CARE_PROVIDER_SITE_OTHER): Payer: Medicare HMO | Admitting: Family Medicine

## 2017-11-12 ENCOUNTER — Other Ambulatory Visit: Payer: Self-pay

## 2017-11-12 ENCOUNTER — Encounter: Payer: Self-pay | Admitting: Family Medicine

## 2017-11-12 VITALS — BP 118/68 | HR 93 | Temp 98.2°F | Ht 63.0 in | Wt 240.0 lb

## 2017-11-12 DIAGNOSIS — F0280 Dementia in other diseases classified elsewhere without behavioral disturbance: Secondary | ICD-10-CM | POA: Diagnosis not present

## 2017-11-12 DIAGNOSIS — R0683 Snoring: Secondary | ICD-10-CM | POA: Diagnosis not present

## 2017-11-12 DIAGNOSIS — G4719 Other hypersomnia: Secondary | ICD-10-CM | POA: Diagnosis not present

## 2017-11-12 DIAGNOSIS — F0150 Vascular dementia without behavioral disturbance: Secondary | ICD-10-CM

## 2017-11-12 DIAGNOSIS — G309 Alzheimer's disease, unspecified: Secondary | ICD-10-CM

## 2017-11-12 HISTORY — DX: Other hypersomnia: G47.19

## 2017-11-12 NOTE — Patient Instructions (Signed)
Tax adviser  Address :[address] : [place] Zip : [place] Website : [email or web address] Contact Email : [email or web address] Office Phone : [phone] Information Phone : [phone] Special Notes : [facility]- [phone] [place]- [phone]  Senior Resources of [place] can provide information on local resources including:   Information and Referral (Senior InfoLine) providing seniors, family members, caregivers and others access to information and assistance for the elderly.  Home Delivered Meals (Meals on Wheels)  [place] for those ages 37 and over are offered daytime supervised programs targeted to meet their social, educational, physical and recreational interests. A lunch is also served to those who meet the eligibility criteria.  Caregivers - Volunteer-based program designed to provide seniors with 1-3 hours of service per week. Services include friendly visiting, assistance with grocery shopping, errands, general transportation, etc.  Medical Transportation: Transportation to medical appointments for seniors ages 74 and over who do not receive Medicaid. Funds are occasionally available to transport disabled individuals under 60 who do not receive Medicaid.  Seniors[place] (S.H.I.I.P.): Volunteers are trained by the [place] S.H.I.I.P. office, to provide free information, counseling and education to [employer] beneficiaries and their family members about: Medicare, Medicare Supplements, [place], and [place] Prescription Drug Plans.  Nutritional Supplement Program: Ensure, Ensure Plus, and Glucerna products are sold at reduced prices. Simply fill out an application.    Alzheimer's Association [place] is one of over 37 Alzheimer's Association chapters serving  communities across the [place]. 24/7 Helpline: 1.7190882747 Website at [employer].co.nz             Local chapters across the [place], providing services within each community.  Professionally staffed 24/7 Helpline (1.7190882747) offers information and advice to more than 300,000 callers each year and provides translation services in more than 200 languages.   Host face-to-face support groups and educational sessions in communities nationwide.   Connect people across the globe through our [employer], ALZConnected. Our online community is ready to answer your questions and give you support.   Provide caregivers and families with comprehensive online resources and information through our Alzheimer's and [place], which features sections on early-stage, middle-stage and late-stage caregiving.   Help people find clinical studies through our free, easy-to-use matching service Alzheimer's Association TrialMatch. TrialMatch connects individuals with Alzheimer's, caregivers, healthy volunteers and physicians with current studies.   Offer a free online tool, Alzheimer's Navigator, helps those facing the disease to determine their needs and develop an action plan, and our online [employer] is a Glass blower/designer of programs and service, housing and care services, and Clinical cytogeneticist.   House the Alzheimer's Association DIRECTV, the nation's DIRECTV and resource center devoted to increasing knowledge about Alzheimer's disease and related dementias.  Provide safety services, Comfort Zone and MedicAlert + Alzheimer's Association Safe Return, provide location management for people with Alzheimer's who wander.   Our annual Walk to End Alzheimer's is the world's largest event to raise awareness and funds for Alzheimer's care, support and research.

## 2017-11-25 ENCOUNTER — Encounter: Payer: Self-pay | Admitting: Family Medicine

## 2017-11-25 NOTE — Progress Notes (Signed)
40 minutes face to face where spent in total with counseling / coordination of care took more than 50% of the total time. Counseling involved discussion with patient's children about the diagnosis of Alheimer's dementia, its prognosis, common medical, legal, social, and psychological issues that may occur.  We discussed driving safety with dementia.  Petra Kuba of advanced directives and role of HC POA in care of patients who are unable to speak for themselves.   Referral was agreedto and sent to Alzheimr's association

## 2017-11-25 NOTE — Assessment & Plan Note (Signed)
x

## 2017-12-01 ENCOUNTER — Other Ambulatory Visit: Payer: Self-pay

## 2017-12-01 ENCOUNTER — Encounter: Payer: Self-pay | Admitting: Internal Medicine

## 2017-12-01 ENCOUNTER — Ambulatory Visit (INDEPENDENT_AMBULATORY_CARE_PROVIDER_SITE_OTHER): Payer: Medicare HMO | Admitting: Internal Medicine

## 2017-12-01 VITALS — BP 120/72 | HR 101 | Temp 98.5°F | Wt 242.6 lb

## 2017-12-01 DIAGNOSIS — G4719 Other hypersomnia: Secondary | ICD-10-CM | POA: Diagnosis not present

## 2017-12-01 DIAGNOSIS — I951 Orthostatic hypotension: Secondary | ICD-10-CM | POA: Diagnosis not present

## 2017-12-01 DIAGNOSIS — E119 Type 2 diabetes mellitus without complications: Secondary | ICD-10-CM | POA: Diagnosis not present

## 2017-12-01 LAB — POCT GLYCOSYLATED HEMOGLOBIN (HGB A1C): Hemoglobin A1C: 8.2

## 2017-12-01 NOTE — Assessment & Plan Note (Signed)
Worsening glycemic control. A1C increased from 7.3 at last visit in 07/2017 to 8.2 today. Possible dietary changes, with increased consumption of starches (potatoes and corn). Patient wishing to attempt dietary improvements prior to beginning a secondary medication in addition to metformin. Recheck A1C in 3 months. If higher or not improved, would consider adding another oral agent. Do not feel that insulin is indicated at this time.

## 2017-12-01 NOTE — Assessment & Plan Note (Signed)
Likely cause of dizziness. BP at goal today at 120/72. Home BPs below goal. As such, will discontinue lisinopril. Encouraged to continue monitoring BP at home, and to call if persistently greater than 130/90s. F/u in 3 months. Can resume anti-hypertensive at that time if necessary.

## 2017-12-01 NOTE — Patient Instructions (Addendum)
It was nice seeing you again today [name]!  Please STOP taking lisinopril. Continue to measure your blood pressure at home, and bring a log of your home blood pressures to your next appointment. If your blood pressure at home is consistently above 130/90, please let me know.   We discussed making changes to your diet to help improve your blood sugar, primarily decreasing the amount of corn and potatoes you are eating, as well as other starchy foods. Please continue to check your blood sugar at home. If it remains in the 200s or above, please call to let us know. Otherwise we will check your A1C again in three months.   If you have any questions or concerns, please feel free to call the clinic.   Be well,  Dr. Avon Gully

## 2017-12-01 NOTE — Progress Notes (Signed)
   Subjective:   Patient: [name]       Birthdate: 11-09-1955       MRN: [id number]      HPI  [name] is a 62 y.o. female presenting for f/u of Type II DM, HTN, somnolence.   Type II DM Taking metformin BID. Measures blood sugar at home regularly. Readings persistently in 200s. Has not made any changes in her diet recently. Does not eat many sweets, but does eat a lot of corn and potatoes.   HTN Checks blood pressure at home occasionally. Readings usually in low 100s/70s. Has been getting dizzy a lot recently, primarily when standing up. Denies HA. Is taking lisinopril daily as prescribed.   Somnolence  Falls asleep randomly throughout the day. Is followed by neurology for Alzheimer's who has referred her for a sleep study to try to distinguish between narcolepsy and OSA.   Smoking status reviewed. Patient is never smoker.   Review of Systems See HPI.     Objective:  Physical Exam  Constitutional: She is oriented to person, place, and time. She appears well-developed and well-nourished. No distress.  HENT:  Head: Normocephalic and atraumatic.  Eyes: Conjunctivae and EOM are normal. Right eye exhibits no discharge. Left eye exhibits no discharge.  Neck: Normal range of motion. Neck supple.  Pulmonary/Chest: Effort normal. No respiratory distress.  Musculoskeletal:  Ambulates well with walker  Neurological: She is alert and oriented to person, place, and time.  Psychiatric: She has a normal mood and affect. Her behavior is normal.      Assessment & Plan:  Orthostatic hypotension Likely cause of dizziness. BP at goal today at 120/72. Home BPs below goal. As such, will discontinue lisinopril. Encouraged to continue monitoring BP at home, and to call if persistently greater than 130/90s. F/u in 3 months. Can resume anti-hypertensive at that time if necessary.   Diabetes mellitus type 2, uncomplicated Worsening glycemic control. A1C increased from 7.3 at last visit in  07/2017 to 8.2 today. Possible dietary changes, with increased consumption of starches (potatoes and corn). Patient wishing to attempt dietary improvements prior to beginning a secondary medication in addition to metformin. Recheck A1C in 3 months. If higher or not improved, would consider adding another oral agent. Do not feel that insulin is indicated at this time.   Excessive daytime sleepiness Referred for sleep study by her neurologist.    Adin Hector, MD, MPH PGY-3 [facility] Pager [phone]

## 2017-12-01 NOTE — Assessment & Plan Note (Signed)
Referred for sleep study by her neurologist.

## 2017-12-10 ENCOUNTER — Encounter (HOSPITAL_COMMUNITY): Payer: Self-pay | Admitting: *Deleted

## 2017-12-10 ENCOUNTER — Emergency Department (HOSPITAL_COMMUNITY)
Admission: EM | Admit: 2017-12-10 | Discharge: 2017-12-11 | Disposition: A | Discharge status: Home / Self Care | Payer: Medicare HMO | Attending: Emergency Medicine | Admitting: Emergency Medicine

## 2017-12-10 ENCOUNTER — Other Ambulatory Visit: Payer: Self-pay

## 2017-12-10 ENCOUNTER — Emergency Department (HOSPITAL_COMMUNITY): Payer: Medicare HMO

## 2017-12-10 DIAGNOSIS — G308 Other Alzheimer's disease: Secondary | ICD-10-CM | POA: Diagnosis not present

## 2017-12-10 DIAGNOSIS — R0602 Shortness of breath: Secondary | ICD-10-CM | POA: Insufficient documentation

## 2017-12-10 DIAGNOSIS — Z7984 Long term (current) use of oral hypoglycemic drugs: Secondary | ICD-10-CM | POA: Insufficient documentation

## 2017-12-10 DIAGNOSIS — E119 Type 2 diabetes mellitus without complications: Secondary | ICD-10-CM | POA: Diagnosis not present

## 2017-12-10 DIAGNOSIS — F0150 Vascular dementia without behavioral disturbance: Secondary | ICD-10-CM | POA: Insufficient documentation

## 2017-12-10 DIAGNOSIS — Z79899 Other long term (current) drug therapy: Secondary | ICD-10-CM | POA: Insufficient documentation

## 2017-12-10 DIAGNOSIS — Z9049 Acquired absence of other specified parts of digestive tract: Secondary | ICD-10-CM | POA: Insufficient documentation

## 2017-12-10 DIAGNOSIS — Z8673 Personal history of transient ischemic attack (TIA), and cerebral infarction without residual deficits: Secondary | ICD-10-CM | POA: Insufficient documentation

## 2017-12-10 DIAGNOSIS — I10 Essential (primary) hypertension: Secondary | ICD-10-CM | POA: Insufficient documentation

## 2017-12-10 DIAGNOSIS — Z7982 Long term (current) use of aspirin: Secondary | ICD-10-CM | POA: Insufficient documentation

## 2017-12-10 DIAGNOSIS — R0600 Dyspnea, unspecified: Secondary | ICD-10-CM | POA: Diagnosis present

## 2017-12-10 LAB — I-STAT TROPONIN, ED: Troponin i, poc: 0.02 ng/mL (ref 0.00–0.08)

## 2017-12-10 LAB — CBC
HCT: 35.6 % — ABNORMAL LOW (ref 36.0–46.0)
Hemoglobin: 11.7 g/dL — ABNORMAL LOW (ref 12.0–15.0)
MCH: 31.6 pg (ref 26.0–34.0)
MCHC: 32.9 g/dL (ref 30.0–36.0)
MCV: 96.2 fL (ref 78.0–100.0)
PLATELETS: 186 10*3/uL (ref 150–400)
RBC: 3.7 MIL/uL — ABNORMAL LOW (ref 3.87–5.11)
RDW: 15 % (ref 11.5–15.5)
WBC: 8.2 10*3/uL (ref 4.0–10.5)

## 2017-12-10 LAB — BASIC METABOLIC PANEL
Anion gap: 7 (ref 5–15)
BUN: 8 mg/dL (ref 6–20)
CO2: 27 mmol/L (ref 22–32)
CREATININE: 0.65 mg/dL (ref 0.44–1.00)
Calcium: 9.4 mg/dL (ref 8.9–10.3)
Chloride: 103 mmol/L (ref 101–111)
GFR calc Af Amer: >60 mL/min (ref >60)
GFR calc non Af Amer: >60 mL/min (ref >60)
GLUCOSE: 175 mg/dL — AB (ref 65–99)
Potassium: 4.2 mmol/L (ref 3.5–5.1)
Sodium: 137 mmol/L (ref 135–145)

## 2017-12-10 LAB — BRAIN NATRIURETIC PEPTIDE: B Natriuretic Peptide: 32.5 pg/mL (ref 0.0–100.0)

## 2017-12-10 MED ORDER — PREDNISONE 20 MG PO TABS: 40.0000 mg | ORAL_TABLET | Freq: Every day | ORAL | 0 refills | Status: DC

## 2017-12-10 MED ORDER — DEXAMETHASONE SODIUM PHOSPHATE 10 MG/ML IJ SOLN
10.0000 mg | Freq: Once | INTRAMUSCULAR | Status: AC
  Administered 2017-12-10: 10 mg via INTRAMUSCULAR
  Filled 2017-12-10: qty 1

## 2017-12-10 MED ORDER — ALBUTEROL SULFATE (2.5 MG/3ML) 0.083% IN NEBU
5.0000 mg | INHALATION_SOLUTION | Freq: Once | RESPIRATORY_TRACT | Status: AC
  Administered 2017-12-10: 5 mg via RESPIRATORY_TRACT
  Filled 2017-12-10: qty 6

## 2017-12-10 MED ORDER — ALBUTEROL SULFATE HFA 108 (90 BASE) MCG/ACT IN AERS
2.0000 | INHALATION_SPRAY | Freq: Four times a day (QID) | RESPIRATORY_TRACT | Status: DC
  Administered 2017-12-10: 2 via RESPIRATORY_TRACT
  Filled 2017-12-10: qty 6.7

## 2017-12-10 NOTE — Discharge Instructions (Signed)
As discussed, today's evaluation has been generally reassuring. However, with your difficulty breathing it is important to continue to work with your physicians.  Return here for concerning changes.  For the next 2 days please use the provided albuterol every 4 hours, you may then use it as needed. For the next 4 days please take the prescribed prednisone.

## 2017-12-10 NOTE — ED Provider Notes (Signed)
[facility] EMERGENCY DEPARTMENT Provider Note   CSN: [id number] Arrival date & time: 12/10/17  1933     History   Chief Complaint Chief Complaint  Patient presents with  . Shortness of Breath    HPI [name] is a 62 y.o. female.  HPI  Patient presents with concern of dyspnea. Symptoms began about 4 days ago, patient recalls one sick contact, with a sick grandchild prior to the onset, but otherwise was well prior to this illness. Since onset she has had persistent mild dyspnea, minimal cough, no chest pain, no head pain, no fever, no chills, nausea, no vomiting. Patient has history of diabetes, hypertension, has no known pulmonary or cardiac disease, but is preparing for evaluation of pulmonary function, and sleep study in the coming weeks. Since onset no clear alleviating or exacerbating factors. She presents tonight because she told her daughter about her illness, and was brought here for evaluation.  Past Medical History:  Diagnosis Date  . Allergy   . Arthritis   . Diabetes mellitus, type 2 ([place])   . Excessive daytime sleepiness 11/12/2017  . GERD (gastroesophageal reflux disease)   . Goiter   . Hypertension   . Kidney stone   . Memory loss   . 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 Osteoporosis    in lower back per pt   . Plantar fasciitis   . Snoring 08/28/2017  . Stroke [facility])    residual left sided weakness  . Tremors of nervous system   . Vertigo     Patient Active Problem List   Diagnosis Date Noted  . Excessive daytime sleepiness 11/12/2017  . Snoring 08/28/2017  . 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 Abnormal TSH 08/28/2017  . Memory loss 08/10/2017  . Tinea pedis 08/10/2017  . Orthostatic hypotension 11/20/2016  . Urge incontinence 07/25/2016  . Impaired functional mobility, balance, and endurance 07/24/2016  . Falls 05/23/2016  . Warts 05/23/2016  . Chronic dental pain 02/22/2016  .  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 Disorder of sacroiliac joint 05/15/2015  . Dyslipidemia associated with type 2 diabetes mellitus ([place]) 04/26/2015  . Benign paroxysmal positional vertigo 01/24/2015  . Diabetes mellitus type 2, uncomplicated ([place]) 38/18/2993  . Vitamin D deficiency 07/11/2013  . Mood disorder ([place]) 07/11/2013  . Osteopenia 11/04/2012  . Heel pain 08/14/2011  . [place]ton[place] of bladder 06/10/2010  . Uterovaginal prolapse, incomplete 06/10/2010  . VAGINITIS, ATROPHIC 05/17/2010  . 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 Chronic pain syndrome 04/26/2010  . OBESITY, NOS 09/17/2006  . GASTROESOPHAGEAL REFLUX, NO ESOPHAGITIS 09/17/2006    Past Surgical History:  Procedure Laterality Date  . APPENDECTOMY    . BLADDER SURGERY    . CATARACT EXTRACTION W/PHACO Left 05/06/2017   Procedure: CATARACT EXTRACTION PHACO AND INTRAOCULAR LENS PLACEMENT ([place]) LEFT DIABETIC;  Surgeon: Leandrew Koyanagi, MD;  Location: [facility];  Service: Ophthalmology;  Laterality: Left;  Diabetic - oral meds  . CATARACT EXTRACTION W/PHACO Right 06/03/2017   Procedure: CATARACT EXTRACTION PHACO AND INTRAOCULAR LENS PLACEMENT ([place]) RIGHT DIABETIC;  Surgeon: Leandrew Koyanagi, MD;  Location: [facility];  Service: Ophthalmology;  Laterality: Right;  Diabetic - oral meds  . CHOLECYSTECTOMY    . FOOT SURGERY    . TOTAL ABDOMINAL HYSTERECTOMY       OB History   None      Home Medications    Prior to  Admission medications   Medication Sig Start Date End Date Taking? Authorizing Provider  ACCU-CHEK AVIVA PLUS test strip USE  TO CHECK BLOOD SUGAR THREE TIMES DAILY 12/25/16  Yes Verner Mould, MD  ACCU-CHEK SOFTCLIX LANCETS lancets USE  TO CHECK BLOOD SUGAR THREE TIMES DAILY 01/28/17  Yes Verner Mould, MD  Alcohol Swabs (B-D SINGLE USE SWABS REGULAR) PADS USE  TO  CLEAN  SKIN  BEFORE CHECKING  BLOOD SUGAR 03/30/17  Yes Verner Mould, MD  alendronate (FOSAMAX) 70 MG tablet TAKE 1 TABLET EVERY 7 DAYS 09/29/16  Yes Verner Mould, MD  aspirin EC 81 MG tablet Take 81 mg by mouth daily.   Yes [provider]  atorvastatin (LIPITOR) 10 MG tablet Take 1 tablet (10 mg total) by mouth daily. 08/10/17  Yes Verner Mould, MD  calcium citrate-vitamin D (CITRACAL+D) 315-200 MG-UNIT tablet Take 1 tablet by mouth daily.   Yes [provider]  clotrimazole (LOTRIMIN) 1 % cream Apply 1 application topically 2 (two) times daily. Patient taking differently: Apply 1 application topically 2 (two) times daily as needed (skin care).  08/10/17  Yes Verner Mould, MD  cyclobenzaprine (FLEXERIL) 10 MG tablet TAKE 1 TABLET THREE TIMES DAILY AS NEEDED FOR MUSCLE SPASMS. Patient taking differently: TAKE 1 TABLET THREE TIMES DAILY 11/09/17  Yes Riccio, Angela C, DO  docusate sodium (COLACE) 100 MG capsule Take 100 mg by mouth daily.   Yes [provider]  DULoxetine (CYMBALTA) 60 MG capsule Take 1 capsule (60 mg total) by mouth daily. 08/10/17  Yes Verner Mould, MD  gabapentin (NEURONTIN) 800 MG tablet Take 1 tablet (800 mg total) by mouth 3 (three) times daily. 08/10/17  Yes Verner Mould, MD  lisinopril (PRINIVIL,ZESTRIL) 10 MG tablet Take 10 mg by mouth daily.   Yes [provider]  meclizine (ANTIVERT) 12.5 MG tablet TAKE 1 TABLET 3 TIMES DAILY AS NEEDED FOR DIZZINESS. 11/09/17  Yes Riccio, Angela C, DO  meloxicam (MOBIC) 15 MG tablet TAKE 1 TABLET EVERY DAY 07/17/17  Yes Verner Mould, MD  metFORMIN (GLUCOPHAGE) 1000 MG tablet Take 1 tablet (1,000 mg total) by mouth 2 (two) times daily with a meal. 08/10/17  Yes Verner Mould, MD  Multiple Vitamins-Minerals ([employer] SPECTRAVITE WOMENS SENIOR PO) Take by mouth daily.   Yes [provider]  omeprazole (PRILOSEC) 20 MG capsule TAKE 1  CAPSULE EVERY DAY 07/17/17  Yes Verner Mould, MD  ondansetron (ZOFRAN) 4 MG tablet TAKE 1 TABLET EVERY 8 HOURS AS NEEDED  FOR NAUSEA  AND  VOMITING 01/09/17  Yes Verner Mould, MD  Psyllium (METAMUCIL PO) Take 1 scoop by mouth daily.   Yes [provider]  predniSONE (DELTASONE) 20 MG tablet Take 2 tablets (40 mg total) by mouth daily with breakfast. For the next four days 12/10/17   Carmin Muskrat, MD    Family History Family History  Problem Relation Age of Onset  . Healthy Mother   . Heart disease Father   . Alcohol abuse Father   . Alcohol abuse Brother   . Colon polyps Neg Hx   . Colon cancer Neg Hx   . Esophageal cancer Neg Hx   . Rectal cancer Neg Hx   . Stomach cancer Neg Hx     Social History Social History   Tobacco Use  . Smoking status: Never Smoker  . Smokeless tobacco: Never Used  Substance Use Topics  . Alcohol use: No  .  Drug use: No     Allergies   Hydrocodone-acetaminophen   Review of Systems Review of Systems  Constitutional:       Per HPI, otherwise negative  HENT:       Per HPI, otherwise negative  Respiratory:       Per HPI, otherwise negative  Cardiovascular:       Per HPI, otherwise negative  Gastrointestinal: Negative for vomiting.  Endocrine:       Negative aside from HPI  Genitourinary:       Neg aside from HPI   Musculoskeletal:       Per HPI, otherwise negative  Skin: Negative.   Neurological: Negative for syncope.     Physical Exam Updated Vital Signs BP 132/70   Pulse 94   Temp 98.6 F (37 C) (Oral)   Resp (!) 22   SpO2 97%   Physical Exam  Constitutional: She is oriented to person, place, and time. She appears well-developed and well-nourished. No distress.  Obese, elderly F awake and alert  HENT:  Head: Normocephalic and atraumatic.  Eyes: Conjunctivae and EOM are normal.  Cardiovascular: Normal rate and regular rhythm.  Pulmonary/Chest: She has decreased breath sounds. She  exhibits no deformity.  Abdominal: She exhibits no distension.  Musculoskeletal:       Right lower leg: She exhibits edema.  Neurological: She is alert and oriented to person, place, and time. No cranial nerve deficit.  Skin: Skin is warm and dry.  Psychiatric: She has a normal mood and affect.  Nursing note and vitals reviewed.    ED Treatments / Results  Labs (all labs ordered are listed, but only abnormal results are displayed) Labs Reviewed  BASIC METABOLIC PANEL - Abnormal; Notable for the following components:      Result Value   Glucose, Bld 175 (*)    All other components within normal limits  CBC - Abnormal; Notable for the following components:   RBC 3.70 (*)    Hemoglobin 11.7 (*)    HCT 35.6 (*)    All other components within normal limits  BRAIN NATRIURETIC PEPTIDE  I-STAT TROPONIN, ED    EKG EKG Interpretation  Date/Time:  Thursday Dec 10 2017 19:40:50 EDT Ventricular Rate:  94 PR Interval:  144 QRS Duration: 92 QT Interval:  346 QTC Calculation: 432 R Axis:   27 Text Interpretation:  Normal sinus rhythm with sinus arrhythmia Artifact No significant change since last tracing Abnormal ekg Confirmed by Carmin Muskrat [phone]) on 12/10/2017 9:35:31 PM   Radiology Dg Chest 2 View  Result Date: 12/10/2017 CLINICAL DATA:  Coughing congestion. EXAM: CHEST - 2 VIEW COMPARISON:  09/25/2005 FINDINGS: The lungs are clear without focal pneumonia, edema, pneumothorax or pleural effusion. Interstitial markings are diffusely coarsened with chronic features. The cardio pericardial silhouette is enlarged. The visualized bony structures of the thorax are intact. IMPRESSION: Cardiomegaly with interstitial coarsening. No acute cardiopulmonary findings. Electronically Signed   By: Misty Stanley M.D.   On: 12/10/2017 20:18    Procedures Procedures (including critical care time)  Medications Ordered in ED Medications  albuterol (PROVENTIL HFA;VENTOLIN HFA) 108 (90 Base)  MCG/ACT inhaler 2 puff (has no administration in time range)  albuterol (PROVENTIL) (2.5 MG/3ML) 0.083% nebulizer solution 5 mg (5 mg Nebulization Given 12/10/17 2147)  dexamethasone (DECADRON) injection 10 mg (10 mg Intramuscular Given 12/10/17 2147)     Initial Impression / Assessment and Plan / ED Course  I have reviewed the triage vital signs and the  nursing notes.  Pertinent labs & imaging results that were available during my care of the patient were reviewed by me and considered in my medical decision making (see chart for details).     11:17 PM Patient improved with breathing treatment.  I discussed all findings, with daughter present, no evidence for pneumonia, CHF, coronary ischemia. Patient improved here, and is currently being evaluated for respiratory issues, including sleep apnea, COPD, this may represent an exacerbation. With no evidence for hypoxia, respiratory distress, pneumonia, patient was started on steroids, albuterol, discharged in stable condition.  Final Clinical Impressions(s) / ED Diagnoses   Final diagnoses:  SOB (shortness of breath)    ED Discharge Orders        Ordered    predniSONE (DELTASONE) 20 MG tablet  Daily with breakfast     12/10/17 2311       Carmin Muskrat, MD 12/10/17 2332

## 2017-12-10 NOTE — ED Triage Notes (Signed)
Pt has had cough and congestion for the past month. Over the past 2-3 days reports increased difficulty catching her breath. Denies pain

## 2017-12-10 NOTE — ED Notes (Signed)
ED Provider at bedside. 

## 2017-12-10 NOTE — ED Notes (Signed)
Pt departed in NAD, refused use of wheelchair.  

## 2017-12-12 ENCOUNTER — Emergency Department (HOSPITAL_COMMUNITY): Payer: Medicare HMO

## 2017-12-12 ENCOUNTER — Encounter (HOSPITAL_COMMUNITY): Payer: Self-pay | Admitting: Emergency Medicine

## 2017-12-12 ENCOUNTER — Emergency Department (HOSPITAL_COMMUNITY)
Admission: EM | Admit: 2017-12-12 | Discharge: 2017-12-13 | Disposition: A | Discharge status: Home / Self Care | Payer: Medicare HMO | Attending: Emergency Medicine | Admitting: Emergency Medicine

## 2017-12-12 ENCOUNTER — Other Ambulatory Visit: Payer: Self-pay

## 2017-12-12 DIAGNOSIS — G308 Other Alzheimer's disease: Secondary | ICD-10-CM | POA: Insufficient documentation

## 2017-12-12 DIAGNOSIS — F0150 Vascular dementia without behavioral disturbance: Secondary | ICD-10-CM | POA: Diagnosis not present

## 2017-12-12 DIAGNOSIS — Z79899 Other long term (current) drug therapy: Secondary | ICD-10-CM | POA: Insufficient documentation

## 2017-12-12 DIAGNOSIS — Z7982 Long term (current) use of aspirin: Secondary | ICD-10-CM | POA: Insufficient documentation

## 2017-12-12 DIAGNOSIS — E119 Type 2 diabetes mellitus without complications: Secondary | ICD-10-CM | POA: Insufficient documentation

## 2017-12-12 DIAGNOSIS — Z7984 Long term (current) use of oral hypoglycemic drugs: Secondary | ICD-10-CM | POA: Diagnosis not present

## 2017-12-12 DIAGNOSIS — R0602 Shortness of breath: Secondary | ICD-10-CM

## 2017-12-12 DIAGNOSIS — I69959 Hemiplegia and hemiparesis following unspecified cerebrovascular disease affecting unspecified side: Secondary | ICD-10-CM | POA: Insufficient documentation

## 2017-12-12 DIAGNOSIS — I10 Essential (primary) hypertension: Secondary | ICD-10-CM | POA: Diagnosis not present

## 2017-12-12 LAB — CBC
HEMATOCRIT: 35.2 % — AB (ref 36.0–46.0)
HEMOGLOBIN: 11.6 g/dL — AB (ref 12.0–15.0)
MCH: 31.8 pg (ref 26.0–34.0)
MCHC: 33 g/dL (ref 30.0–36.0)
MCV: 96.4 fL (ref 78.0–100.0)
Platelets: 205 10*3/uL (ref 150–400)
RBC: 3.65 MIL/uL — ABNORMAL LOW (ref 3.87–5.11)
RDW: 15.2 % (ref 11.5–15.5)
WBC: 9.5 10*3/uL (ref 4.0–10.5)

## 2017-12-12 LAB — D-DIMER, QUANTITATIVE: D-Dimer, Quant: 1.51 ug/mL-FEU — ABNORMAL HIGH (ref 0.00–0.50)

## 2017-12-12 LAB — BRAIN NATRIURETIC PEPTIDE: B Natriuretic Peptide: 67.1 pg/mL (ref 0.0–100.0)

## 2017-12-12 LAB — I-STAT TROPONIN, ED: Troponin i, poc: 0.01 ng/mL (ref 0.00–0.08)

## 2017-12-12 MED ORDER — ALBUTEROL SULFATE (2.5 MG/3ML) 0.083% IN NEBU
5.0000 mg | INHALATION_SOLUTION | Freq: Once | RESPIRATORY_TRACT | Status: AC
  Administered 2017-12-12: 5 mg via RESPIRATORY_TRACT
  Filled 2017-12-12: qty 6

## 2017-12-12 MED ORDER — IPRATROPIUM-ALBUTEROL 0.5-2.5 (3) MG/3ML IN SOLN
3.0000 mL | Freq: Once | RESPIRATORY_TRACT | Status: AC
  Administered 2017-12-12: 3 mL via RESPIRATORY_TRACT
  Filled 2017-12-12: qty 3

## 2017-12-12 MED ORDER — METHYLPREDNISOLONE SODIUM SUCC 125 MG IJ SOLR
125.0000 mg | Freq: Once | INTRAMUSCULAR | Status: AC
  Administered 2017-12-12: 125 mg via INTRAVENOUS
  Filled 2017-12-12: qty 2

## 2017-12-12 NOTE — ED Notes (Signed)
Pt placed on nasal cannula. O2 sat dropped as low as 90% but was maintained between 92-94%. Pt complaining of tightness in the chest and feelings of not being able to take a deep breath.

## 2017-12-12 NOTE — ED Provider Notes (Signed)
Assumed care from Dr. Tomi Bamberger.  See prior note for full H&P.  Briefly, 62-year-old female here with shortness of breath.  Has been having episodes over the past several days.  No known alleviating or exacerbating factors.  Seen recently in the ED for same, started steroids and inhaler.  States she continues to have episodes of this, when this happens she gets anxious with seems to make her symptoms worse.  Labs thus far reassuring.  Has been given steroids and nebulizer treatment.  Plan:  Remainder of labs pending, CXR.  If looking ok, will need to ambulate.  Chest x-ray without focal infiltrate.  D-dimer is elevated.  We will proceed with CTA.  Results for orders placed or performed during the hospital encounter of 12/12/17  CBC  Result Value Ref Range   WBC 9.5 4.0 - 10.5 K/uL   RBC 3.65 (L) 3.87 - 5.11 MIL/uL   Hemoglobin 11.6 (L) 12.0 - 15.0 g/dL   HCT 35.2 (L) 36.0 - 46.0 %   MCV 96.4 78.0 - 100.0 fL   MCH 31.8 26.0 - 34.0 pg   MCHC 33.0 30.0 - 36.0 g/dL   RDW 15.2 11.5 - 15.5 %   Platelets 205 150 - 400 K/uL  Brain natriuretic peptide  Result Value Ref Range   B Natriuretic Peptide 67.1 0.0 - 100.0 pg/mL  D-dimer, quantitative (not at [facility])  Result Value Ref Range   D-Dimer, Quant 1.51 (H) 0.00 - 0.50 ug/mL-FEU  I-stat troponin, ED  Result Value Ref Range   Troponin i, poc 0.01 0.00 - 0.08 ng/mL   Comment 3           Dg Chest 2 View  Result Date: 12/12/2017 CLINICAL DATA:  Shortness of breath EXAM: CHEST - 2 VIEW COMPARISON:  Dec 10, 2017 FINDINGS: There is no appreciable edema or consolidation. Heart size and pulmonary vascularity are normal. No adenopathy. Stomach is distended with fluid and air. IMPRESSION: Stomach distended with fluid and air.  No edema or consolidation. Electronically Signed   By: Lowella Grip III M.D.   On: 12/12/2017 17:47   Dg Chest 2 View  Result Date: 12/10/2017 CLINICAL DATA:  Coughing congestion. EXAM: CHEST - 2 VIEW COMPARISON:  09/25/2005  FINDINGS: The lungs are clear without focal pneumonia, edema, pneumothorax or pleural effusion. Interstitial markings are diffusely coarsened with chronic features. The cardio pericardial silhouette is enlarged. The visualized bony structures of the thorax are intact. IMPRESSION: Cardiomegaly with interstitial coarsening. No acute cardiopulmonary findings. Electronically Signed   By: Misty Stanley M.D.   On: 12/10/2017 20:18   Ct Angio Chest Pe W And/or Wo Contrast  Result Date: 12/13/2017 CLINICAL DATA:  Shortness of breath and elevated D-dimer. EXAM: CT ANGIOGRAPHY CHEST WITH CONTRAST TECHNIQUE: Multidetector CT imaging of the chest was performed using the standard protocol during bolus administration of intravenous contrast. Multiplanar CT image reconstructions and MIPs were obtained to evaluate the vascular anatomy. CONTRAST:  174mL ISOVUE-370 IOPAMIDOL (ISOVUE-370) INJECTION 76% COMPARISON:  None. FINDINGS: Cardiovascular: Poor contrast bolus with limited opacification of the central and segmental pulmonary arteries. As visualized, no focal filling defects are demonstrated suggesting no evidence of large significant pulmonary embolus. Smaller emboli may be obscured to visualization. Normal heart size. No pericardial effusion. Normal caliber thoracic aorta. No aortic dissection. Great vessel origins are patent. Mediastinum/Nodes: Prominent diffuse enlargement of the thyroid gland with extension into the upper chest. This is likely due to thyroid goiter. Esophagus is decompressed. No significant lymphadenopathy. Lungs/Pleura: Motion  artifact limits examination. Mild dependent atelectasis. No consolidation. No pleural effusions. No pneumothorax. Airways are patent. Nodule in the right middle lung measuring 4 mm diameter. Upper Abdomen: No acute abnormality. Musculoskeletal: Degenerative changes in the spine. No destructive bone lesions. Review of the MIP images confirms the above findings. IMPRESSION: 1. Poor  contrast bolus limits examination but no large central pulmonary emboli are demonstrated. 2. Atelectasis in the lung bases. No evidence of active pulmonary disease. 3. 4 mm nodule in the right middle lung. No follow-up needed if patient is low-risk. Non-contrast chest CT can be considered in 12 months if patient is high-risk. This recommendation follows the consensus statement: Guidelines for Management of Incidental Pulmonary Nodules Detected on CT Images: From the Fleischner Society 2017; Radiology 2017; 284:228-243. Electronically Signed   By: Lucienne Capers M.D.   On: 12/13/2017 00:59    1:18 AM  CTA study somewhat limited due to poor contrast bolus, however no large emboli noted.  Does not have nodule noted, patient made aware and will need follow-up for this.  Patients states she is feeling tremendously better than when she arrived.  Her oxygen remains stable.  States she has gotten up and walked around without issue.  After talking with her, it does seem she has been having intermittent bronchospasm, potentially worsened by anxiety when this happens.  Daughter reports to me that there was incorrect dosing of her steroids that the pharmacist explained to them, so did not get full steroid taper.  She also was having issues using the inhaler correctly and was not getting much of the medicine in.  We have gone over mechanics of using inhaler, I have also provided her with space to help with this a little.  She likely would benefit from pulmonology follow-up.  Will have her follow-up closely with PCP as well.  Patient and daughter are comfortable with discharge home, both acknowledge understanding of the treatment plan.  They understand to return here for any new or worsening symptoms.   Larene Pickett, PA-C 12/13/17 [id number]    Jola Schmidt, MD 12/13/17 [phone]

## 2017-12-12 NOTE — ED Triage Notes (Signed)
States she used her inhaler 43minutes prior to arrival. Pt tachycardic in triage

## 2017-12-12 NOTE — ED Provider Notes (Signed)
[place] EMERGENCY DEPARTMENT Provider Note   CSN: [id number] Arrival date & time: 12/12/17  1712     History   Chief Complaint Chief Complaint  Patient presents with  . Shortness of Breath    HPI [name] is a 62 y.o. female.  HPI Patient presents to the emergency room for evaluation of shortness of breath.  Patient states she started having episodes of shortness of breath a few days ago.  She does not normally have any difficulty with her breathing.  She was seen in the emergency room on May 23.  Patient was discharged home on a course of steroids and was given an albuterol inhaler.  Patient states she continues to have some of those symptoms.  She has used her inhaler multiple times since discharge.  She has had episodes where she gets severely short of breath and becomes very anxious and cannot catch her breath.  She does not feel like she is getting any better.  She denies any leg swelling.  No fevers.  No chest pain. Past Medical History:  Diagnosis Date  . Allergy   . Arthritis   . Diabetes mellitus, type 2 ([place])   . Excessive daytime sleepiness 11/12/2017  . GERD (gastroesophageal reflux disease)   . Goiter   . Hypertension   . Kidney stone   . Memory loss   . 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 Osteoporosis    in lower back per pt   . Plantar fasciitis   . Snoring 08/28/2017  . Stroke [facility])    residual left sided weakness  . Tremors of nervous system   . Vertigo     Patient Active Problem List   Diagnosis Date Noted  . Excessive daytime sleepiness 11/12/2017  . Snoring 08/28/2017  . 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 Abnormal TSH 08/28/2017  . Memory loss 08/10/2017  . Tinea pedis 08/10/2017  . Orthostatic hypotension 11/20/2016  . Urge incontinence 07/25/2016  . Impaired functional mobility, balance, and endurance 07/24/2016  . Falls 05/23/2016  . Warts 05/23/2016  . Chronic dental pain  02/22/2016  . 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 Disorder of sacroiliac joint 05/15/2015  . Dyslipidemia associated with type 2 diabetes mellitus ([place]) 04/26/2015  . Benign paroxysmal positional vertigo 01/24/2015  . Diabetes mellitus type 2, uncomplicated ([place]) 94/17/4081  . Vitamin D deficiency 07/11/2013  . Mood disorder ([place]) 07/11/2013  . Osteopenia 11/04/2012  . Heel pain 08/14/2011  . Atony of bladder 06/10/2010  . Uterovaginal prolapse, incomplete 06/10/2010  . VAGINITIS, ATROPHIC 05/17/2010  . 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 Chronic pain syndrome 04/26/2010  . OBESITY, NOS 09/17/2006  . GASTROESOPHAGEAL REFLUX, NO ESOPHAGITIS 09/17/2006    Past Surgical History:  Procedure Laterality Date  . APPENDECTOMY    . BLADDER SURGERY    . CATARACT EXTRACTION W/PHACO Left 05/06/2017   Procedure: CATARACT EXTRACTION PHACO AND INTRAOCULAR LENS PLACEMENT ([place]) LEFT DIABETIC;  Surgeon: Leandrew Koyanagi, MD;  Location: [place];  Service: Ophthalmology;  Laterality: Left;  Diabetic - oral meds  . CATARACT EXTRACTION W/PHACO Right 06/03/2017   Procedure: CATARACT EXTRACTION PHACO AND INTRAOCULAR LENS PLACEMENT ([place]) RIGHT DIABETIC;  Surgeon: Leandrew Koyanagi, MD;  Location: [place];  Service: Ophthalmology;  Laterality: Right;  Diabetic - oral meds  . CHOLECYSTECTOMY    . FOOT SURGERY    . TOTAL ABDOMINAL HYSTERECTOMY  OB History   None      Home Medications    Prior to Admission medications   Medication Sig Start Date End Date Taking? Authorizing Provider  ACCU-CHEK AVIVA PLUS test strip USE  TO CHECK BLOOD SUGAR THREE TIMES DAILY 12/25/16   Verner Mould, MD  ACCU-CHEK SOFTCLIX LANCETS lancets USE  TO CHECK BLOOD SUGAR THREE TIMES DAILY 01/28/17   Verner Mould, MD  Alcohol Swabs (B-D SINGLE USE SWABS REGULAR) PADS USE  TO  CLEAN  SKIN  BEFORE  CHECKING BLOOD SUGAR 03/30/17   Verner Mould, MD  alendronate (FOSAMAX) 70 MG tablet TAKE 1 TABLET EVERY 7 DAYS 09/29/16   Verner Mould, MD  aspirin EC 81 MG tablet Take 81 mg by mouth daily.    [provider]  atorvastatin (LIPITOR) 10 MG tablet Take 1 tablet (10 mg total) by mouth daily. 08/10/17   Verner Mould, MD  calcium citrate-vitamin D (CITRACAL+D) 315-200 MG-UNIT tablet Take 1 tablet by mouth daily.    [provider]  clotrimazole (LOTRIMIN) 1 % cream Apply 1 application topically 2 (two) times daily. Patient taking differently: Apply 1 application topically 2 (two) times daily as needed (skin care).  08/10/17   Verner Mould, MD  cyclobenzaprine (FLEXERIL) 10 MG tablet TAKE 1 TABLET THREE TIMES DAILY AS NEEDED FOR MUSCLE SPASMS. Patient taking differently: TAKE 1 TABLET THREE TIMES DAILY 11/09/17   Lucila Maine C, DO  docusate sodium (COLACE) 100 MG capsule Take 100 mg by mouth daily.    [provider]  DULoxetine (CYMBALTA) 60 MG capsule Take 1 capsule (60 mg total) by mouth daily. 08/10/17   Verner Mould, MD  gabapentin (NEURONTIN) 800 MG tablet Take 1 tablet (800 mg total) by mouth 3 (three) times daily. 08/10/17   Verner Mould, MD  lisinopril (PRINIVIL,ZESTRIL) 10 MG tablet Take 10 mg by mouth daily.    [provider]  meclizine (ANTIVERT) 12.5 MG tablet TAKE 1 TABLET 3 TIMES DAILY AS NEEDED FOR DIZZINESS. 11/09/17   Steve Rattler, DO  meloxicam (MOBIC) 15 MG tablet TAKE 1 TABLET EVERY DAY 07/17/17   Verner Mould, MD  metFORMIN (GLUCOPHAGE) 1000 MG tablet Take 1 tablet (1,000 mg total) by mouth 2 (two) times daily with a meal. 08/10/17   Verner Mould, MD  Multiple Vitamins-Minerals ([employer] SPECTRAVITE WOMENS SENIOR PO) Take by mouth daily.    [provider]  omeprazole (PRILOSEC) 20 MG capsule TAKE 1 CAPSULE EVERY DAY 07/17/17   Verner Mould, MD  ondansetron (ZOFRAN) 4 MG tablet TAKE 1 TABLET EVERY 8 HOURS AS NEEDED  FOR NAUSEA  AND  VOMITING 01/09/17   Verner Mould, MD  predniSONE (DELTASONE) 20 MG tablet Take 2 tablets (40 mg total) by mouth daily with breakfast. For the next four days 12/10/17   Carmin Muskrat, MD  Psyllium (METAMUCIL PO) Take 1 scoop by mouth daily.    [provider]    Family History Family History  Problem Relation Age of Onset  . Healthy Mother   . Heart disease Father   . Alcohol abuse Father   . Alcohol abuse Brother   . Colon polyps Neg Hx   . Colon cancer Neg Hx   . Esophageal cancer Neg Hx   . Rectal cancer Neg Hx   . Stomach cancer Neg Hx     Social History Social History   Tobacco Use  . Smoking status: Never  Smoker  . Smokeless tobacco: Never Used  Substance Use Topics  . Alcohol use: No  . Drug use: No     Allergies   Hydrocodone-acetaminophen   Review of Systems Review of Systems  All other systems reviewed and are negative.    Physical Exam Updated Vital Signs BP 121/61   Pulse (!) 102   Temp 98.2 F (36.8 C) (Oral)   Resp 20   Ht 1.651 m (5\' 5" )   Wt 109.8 kg (242 lb)   SpO2 97%   BMI 40.27 kg/m   Physical Exam  Constitutional: She appears well-developed and well-nourished. No distress.  HENT:  Head: Normocephalic and atraumatic.  Right Ear: External ear normal.  Left Ear: External ear normal.  Eyes: Conjunctivae are normal. Right eye exhibits no discharge. Left eye exhibits no discharge. No scleral icterus.  Neck: Neck supple. No tracheal deviation present.  Cardiovascular: Normal rate, regular rhythm and intact distal pulses.  Pulmonary/Chest: Effort normal and breath sounds normal. No stridor. No respiratory distress. She has no wheezes. She has no rales.  Coarse inspiratory sounds in the upper airway  Abdominal: Soft. Bowel sounds are normal. She exhibits no distension. There is no tenderness. There is no  rebound and no guarding.  Musculoskeletal: She exhibits no edema or tenderness.  Neurological: She is alert. She has normal strength. No cranial nerve deficit (no facial droop, extraocular movements intact, no slurred speech) or sensory deficit. She exhibits normal muscle tone. She displays no seizure activity. Coordination normal.  Skin: Skin is warm and dry. No rash noted.  Psychiatric: She has a normal mood and affect.  Nursing note and vitals reviewed.    ED Treatments / Results  Labs (all labs ordered are listed, but only abnormal results are displayed) Labs Reviewed  CBC - Abnormal; Notable for the following components:      Result Value   RBC 3.65 (*)    Hemoglobin 11.6 (*)    HCT 35.2 (*)    All other components within normal limits  BRAIN NATRIURETIC PEPTIDE  D-DIMER, QUANTITATIVE (NOT AT [facility])  I-STAT TROPONIN, ED    EKG EKG Interpretation  Date/Time:  Saturday Dec 12 2017 17:18:41 EDT Ventricular Rate:  111 PR Interval:  148 QRS Duration: 88 QT Interval:  336 QTC Calculation: 456 R Axis:   19 Text Interpretation:  Sinus tachycardia Otherwise normal ECG Since last tracing rate faster Confirmed by Dorie Rank [phone]) on 12/12/2017 8:47:39 PM   Radiology Dg Chest 2 View  Result Date: 12/12/2017 CLINICAL DATA:  Shortness of breath EXAM: CHEST - 2 VIEW COMPARISON:  Dec 10, 2017 FINDINGS: There is no appreciable edema or consolidation. Heart size and pulmonary vascularity are normal. No adenopathy. Stomach is distended with fluid and air. IMPRESSION: Stomach distended with fluid and air.  No edema or consolidation. Electronically Signed   By: Lowella Grip III M.D.   On: 12/12/2017 17:47    Procedures Procedures (including critical care time)  Medications Ordered in ED Medications  ipratropium-albuterol (DUONEB) 0.5-2.5 (3) MG/3ML nebulizer solution 3 mL (3 mLs Nebulization Given 12/12/17 2114)  methylPREDNISolone sodium succinate (SOLU-MEDROL) 125 mg/2 mL  injection 125 mg (125 mg Intravenous Given 12/12/17 2114)     Initial Impression / Assessment and Plan / ED Course  I have reviewed the triage vital signs and the nursing notes.  Pertinent labs & imaging results that were available during my care of the patient were reviewed by me and considered in my medical decision  making (see chart for details).   Pt presents to the ED for evaluation of shortness of breath.  No clear wheezing on exam but some signs of upper airway congestion.  Will check labs.  Give treatments.  [facility] will follow up on patient.  Final Clinical Impressions(s) / ED Diagnoses  pending   Dorie Rank, MD 12/12/17 2215

## 2017-12-12 NOTE — ED Triage Notes (Signed)
Pt states that she is having worsening shortness of breath. Was seen Thursday and given steroids and inhaler but she reports it hasn't gotten better.

## 2017-12-13 ENCOUNTER — Emergency Department (HOSPITAL_COMMUNITY): Payer: Medicare HMO

## 2017-12-13 MED ORDER — IOPAMIDOL (ISOVUE-370) INJECTION 76%
100.0000 mL | Freq: Once | INTRAVENOUS | Status: AC | PRN
  Administered 2017-12-13: 100 mL via INTRAVENOUS

## 2017-12-13 MED ORDER — AEROCHAMBER PLUS FLO-VU LARGE MISC
1.0000 | Freq: Once | Status: AC
  Administered 2017-12-13: 1

## 2017-12-13 MED ORDER — ALBUTEROL SULFATE HFA 108 (90 BASE) MCG/ACT IN AERS: 1.0000 | INHALATION_SPRAY | Freq: Four times a day (QID) | RESPIRATORY_TRACT | 0 refills | Status: DC | PRN

## 2017-12-13 MED ORDER — IOPAMIDOL (ISOVUE-370) INJECTION 76%
INTRAVENOUS | Status: AC
  Filled 2017-12-13: qty 100

## 2017-12-13 MED ORDER — PREDNISONE 20 MG PO TABS: ORAL_TABLET | ORAL | 0 refills | Status: DC

## 2017-12-13 MED ORDER — ALBUTEROL SULFATE HFA 108 (90 BASE) MCG/ACT IN AERS
2.0000 | INHALATION_SPRAY | Freq: Once | RESPIRATORY_TRACT | Status: AC
  Administered 2017-12-13: 2 via RESPIRATORY_TRACT
  Filled 2017-12-13: qty 6.7

## 2017-12-13 NOTE — Discharge Instructions (Signed)
Take the prescribed medication as directed. Follow-up with your primary care doctor.  It may be of some benefit to see pulmonology as well .   I have provided their information or you have can have your primary care doctor formally refer you over. Return to the ED for new or worsening symptoms.

## 2017-12-13 NOTE — ED Notes (Signed)
Patient still in CT.

## 2017-12-22 ENCOUNTER — Encounter: Payer: Self-pay | Admitting: Neurology

## 2017-12-25 ENCOUNTER — Encounter: Payer: Self-pay | Admitting: Internal Medicine

## 2017-12-25 ENCOUNTER — Ambulatory Visit (INDEPENDENT_AMBULATORY_CARE_PROVIDER_SITE_OTHER): Payer: Medicare HMO | Admitting: Internal Medicine

## 2017-12-25 DIAGNOSIS — R0602 Shortness of breath: Secondary | ICD-10-CM | POA: Diagnosis not present

## 2017-12-25 MED ORDER — ALBUTEROL SULFATE HFA 108 (90 BASE) MCG/ACT IN AERS: 1.0000 | INHALATION_SPRAY | Freq: Four times a day (QID) | RESPIRATORY_TRACT | 2 refills | Status: DC | PRN

## 2017-12-25 MED ORDER — BECLOMETHASONE DIPROP HFA 40 MCG/ACT IN AERB: 2.0000 | INHALATION_SPRAY | Freq: Two times a day (BID) | RESPIRATORY_TRACT | 11 refills | Status: DC

## 2017-12-25 MED ORDER — ALBUTEROL SULFATE (2.5 MG/3ML) 0.083% IN NEBU: 2.5000 mg | INHALATION_SOLUTION | RESPIRATORY_TRACT | 2 refills | Status: DC | PRN

## 2017-12-25 NOTE — Progress Notes (Signed)
   Subjective:   Patient: ELISSE PENNICK       Birthdate: Aug 04, 1955       MRN: [id number]      HPI  ESTEFANIA KAMIYA is a 62 y.o. female presenting for ED f/u for SOB.   SOB Patient seen in ED on 05/23 and 05/25 for SOB. CTA was performed which did not show emboli but did show lung nodule. Patient improved throughout ED stay with neb treatments and was felt stable for discharge home. SOB was thought due to intermittent bronchospasm possibly worsened by anxiety. She was prescribed prednisone burst and was also given a spacer so she can better use her inhaler.  Today, patient reports no improvement in sx. Is still having frequent episodes of SOB that she does not think are associated with anxiety. No improvement with prednisone. Is still having difficulty using albuterol inhaler despite spacer, but does think it helps some. Denies cough, fever. Sx started around 05/15.    Smoking status reviewed. Patient is never smoker.   Review of Systems See HPI.     Objective:  Physical Exam  Constitutional: She is oriented to person, place, and time. She appears well-developed and well-nourished. No distress.  HENT:  Head: Normocephalic and atraumatic.  Pulmonary/Chest: Effort normal and breath sounds normal. No stridor. No respiratory distress. She has no wheezes. She has no rales.  Neurological: She is alert and oriented to person, place, and time.  Psychiatric: She has a normal mood and affect. Her behavior is normal.     Assessment & Plan:  Shortness of breath Etiology unclear. O2 sat 97% and patient breathing comfortably on RA throughout encounter, which is reassuring. Lungs CTAB, which is also reassuring. Less likely PNA, as CXR performed on 05/25 clear, no fevers, no cough, and lungs CTAB today. Less likely PE as CTA on 05/25 clear and patient without risk factors. Lung nodule noted on CT which does need to be followed up, but no systemic signs of malignancy, and would not expect a small nodule to  be the cause of intermittent SOB. Anxiety certainly could be possible etiology. No smoking history and sudden onset make COPD less likely. Would not expect asthma to suddenly manifest either. As patient did improve in ED with nebulizer and is still having difficulty using albuterol inhaler despite space, nebulizer provided in clinic today and solution prescribed. Will also try QVAR to see if a maintenance med provides any relief. Do not feel that abx or additional steroid courses are indicated, given no signs of infection and no improvement with prior prednisone burst. F/u in one month, or sooner if no improvement. Could consider referral to pulm if no improvement, and/or repeat CXR to make sure no new findings are present.    Adin Hector, MD, MPH PGY-3 [facility] Pager [phone]

## 2017-12-25 NOTE — Assessment & Plan Note (Signed)
Etiology unclear. O2 sat 97% and patient breathing comfortably on RA throughout encounter, which is reassuring. Lungs CTAB, which is also reassuring. Less likely PNA, as CXR performed on 05/25 clear, no fevers, no cough, and lungs CTAB today. Less likely PE as CTA on 05/25 clear and patient without risk factors. Lung nodule noted on CT which does need to be followed up, but no systemic signs of malignancy, and would not expect a small nodule to be the cause of intermittent SOB. Anxiety certainly could be possible etiology. No smoking history and sudden onset make COPD less likely. Would not expect asthma to suddenly manifest either. As patient did improve in ED with nebulizer and is still having difficulty using albuterol inhaler despite space, nebulizer provided in clinic today and solution prescribed. Will also try QVAR to see if a maintenance med provides any relief. Do not feel that abx or additional steroid courses are indicated, given no signs of infection and no improvement with prior prednisone burst. F/u in one month, or sooner if no improvement. Could consider referral to pulm if no improvement, and/or repeat CXR to make sure no new findings are present.

## 2017-12-25 NOTE — Patient Instructions (Addendum)
It was nice seeing you again today [name]!  Please begin using QVAR (maintenance inhaler) every single day, whether you are having difficulty breathing or not. You will take two puffs in the morning and two puffs in the evening. You can continue to use your albuterol inhaler and/or nebulizer as needed for trouble breathing. If you are still having difficulty breathing even after using QVAR and albuterol, please call our office or go to the emergency room.   We will see you back in one month to make sure you are improving. If you do not feel like you are getting better with the daily inhaler, please let us know sooner.   If you have any questions or concerns, please feel free to call the clinic.   Be well,  Dr. Avon Gully

## 2018-01-08 ENCOUNTER — Other Ambulatory Visit: Payer: Self-pay | Admitting: Family Medicine

## 2018-01-08 ENCOUNTER — Other Ambulatory Visit: Payer: Self-pay | Admitting: Internal Medicine

## 2018-01-08 DIAGNOSIS — M47896 Other spondylosis, lumbar region: Secondary | ICD-10-CM

## 2018-01-08 DIAGNOSIS — H8110 Benign paroxysmal vertigo, unspecified ear: Secondary | ICD-10-CM

## 2018-01-08 DIAGNOSIS — G894 Chronic pain syndrome: Secondary | ICD-10-CM

## 2018-01-08 DIAGNOSIS — R112 Nausea with vomiting, unspecified: Secondary | ICD-10-CM

## 2018-01-25 ENCOUNTER — Institutional Professional Consult (permissible substitution): Payer: Self-pay | Admitting: Neurology

## 2018-01-25 ENCOUNTER — Other Ambulatory Visit: Payer: Self-pay

## 2018-01-25 DIAGNOSIS — M47896 Other spondylosis, lumbar region: Secondary | ICD-10-CM

## 2018-01-25 DIAGNOSIS — G894 Chronic pain syndrome: Secondary | ICD-10-CM

## 2018-01-25 MED ORDER — CYCLOBENZAPRINE HCL 10 MG PO TABS: 10.0000 mg | ORAL_TABLET | Freq: Three times a day (TID) | ORAL | 0 refills | Status: AC

## 2018-01-25 MED ORDER — MECLIZINE HCL 12.5 MG PO TABS: ORAL_TABLET | ORAL | 0 refills | Status: DC

## 2018-02-23 ENCOUNTER — Ambulatory Visit: Payer: Medicare HMO | Admitting: Neurology

## 2018-02-23 ENCOUNTER — Encounter: Payer: Self-pay | Admitting: Neurology

## 2018-02-23 VITALS — BP 119/71 | HR 97 | Ht 66.0 in | Wt 246.0 lb

## 2018-02-23 DIAGNOSIS — R51 Headache: Secondary | ICD-10-CM | POA: Diagnosis not present

## 2018-02-23 DIAGNOSIS — E669 Obesity, unspecified: Secondary | ICD-10-CM | POA: Diagnosis not present

## 2018-02-23 DIAGNOSIS — R0683 Snoring: Secondary | ICD-10-CM

## 2018-02-23 DIAGNOSIS — R600 Localized edema: Secondary | ICD-10-CM

## 2018-02-23 DIAGNOSIS — R400 Somnolence: Secondary | ICD-10-CM

## 2018-02-23 DIAGNOSIS — R519 Headache, unspecified: Secondary | ICD-10-CM

## 2018-02-23 DIAGNOSIS — R351 Nocturia: Secondary | ICD-10-CM

## 2018-02-23 DIAGNOSIS — R0681 Apnea, not elsewhere classified: Secondary | ICD-10-CM

## 2018-02-23 NOTE — Progress Notes (Signed)
Subjective:    [name] is a 62 y.o. female.  HPI     Star Age, MD, PhD [facility] [address]. [place], [place] [address]  Dear Dr. Wendy Poet,   I saw your patient, [name], upon your kind request in my neurologic clinic today for initial consultation of her sleep disorder, in particular, concern for underlying obstructive sleep apnea. The patient is unaccompanied today (daughter in law brought her but had to leave to pick up her husband). As you know, [name] is a 62 year old right-handed woman with an underlying medical history of hypertension, reflux disease, arthritis, allergies, history of vertigo, history of stroke, osteoporosis, memory loss, and obesity, who reports snoring and excessive daytime somnolence. I reviewed your office note from 11/12/2017. Her Epworth sleepiness score is 22 out of 24 today, fatigue score is 45 out of 63. She has been told that she positive as in her breathing while asleep. She is a restless sleeper. She reports significant sleep disruption from having to go to the bathroom, on average 4 to 5 times per night. She also has occasional morning headaches. She reports that her sister has sleep apnea and uses a CPAP machine. Both sisters have heart disease. Her bedtime is late, around 1 AM typically, rise time around 9. She has knee pain bilaterally. She walks with a 4 point cane. Of note, she is taking several potentially sedating medications including high-dose gabapentin 800 mg 3 times a day, Cymbalta 60 once daily and Flexeril 10 mg 3 times a day. She is a nonsmoker but grew up around smokers. She does not drink caffeine daily, no alcohol. Her weight has been fluctuating.  Her Past Medical History Is Significant For: Past Medical History:  Diagnosis Date  . Allergy   . Arthritis   . Diabetes mellitus, type 2 ([place])   . Excessive daytime sleepiness 11/12/2017  . GERD (gastroesophageal  reflux disease)   . Goiter   . Hypertension   . Kidney stone   . Memory loss   . 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 Osteoporosis    in lower back per pt   . Plantar fasciitis   . Snoring 08/28/2017  . Stroke [facility])    residual left sided weakness  . Tremors of nervous system   . Vertigo     Her Past Surgical History Is Significant For: Past Surgical History:  Procedure Laterality Date  . APPENDECTOMY    . BLADDER SURGERY    . CATARACT EXTRACTION W/PHACO Left 05/06/2017   Procedure: CATARACT EXTRACTION PHACO AND INTRAOCULAR LENS PLACEMENT ([place]) LEFT DIABETIC;  Surgeon: Leandrew Koyanagi, MD;  Location: [place];  Service: Ophthalmology;  Laterality: Left;  Diabetic - oral meds  . CATARACT EXTRACTION W/PHACO Right 06/03/2017   Procedure: CATARACT EXTRACTION PHACO AND INTRAOCULAR LENS PLACEMENT ([place]) RIGHT DIABETIC;  Surgeon: Leandrew Koyanagi, MD;  Location: [place];  Service: Ophthalmology;  Laterality: Right;  Diabetic - oral meds  . CHOLECYSTECTOMY    . FOOT SURGERY    . TOTAL ABDOMINAL HYSTERECTOMY      Her Family History Is Significant For: Family History  Problem Relation Age of Onset  . Healthy Mother   . Heart disease Father   . Alcohol abuse Father   . Alcohol abuse Brother   . Heart disease Sister   . Diabetes Sister   . Breast cancer Daughter   . Colon polyps Neg Hx   .  Colon cancer Neg Hx   . Esophageal cancer Neg Hx   . Rectal cancer Neg Hx   . Stomach cancer Neg Hx     Her Social History Is Significant For: Social History   Socioeconomic History  . Marital status: Single    Spouse name: Not on file  . Number of children: 4  . Years of education: HS  . Highest education level: Not on file  Occupational History  . Occupation: Unemployed  Social Needs  . Financial resource strain: Not on file  . Food insecurity:    Worry: Not on file    Inability: Not on file  . Transportation needs:    Medical: Not on  file    Non-medical: Not on file  Tobacco Use  . Smoking status: Never Smoker  . Smokeless tobacco: Never Used  Substance and Sexual Activity  . Alcohol use: No  . Drug use: No  . Sexual activity: Not on file  Lifestyle  . Physical activity:    Days per week: Not on file    Minutes per session: Not on file  . Stress: Not on file  Relationships  . Social connections:    Talks on phone: Not on file    Gets together: Not on file    Attends religious service: Not on file    Active member of club or organization: Not on file    Attends meetings of clubs or organizations: Not on file    Relationship status: Not on file  Other Topics Concern  . Not on file  Social History Narrative   Lives at home alone.   Right-handed.   Rarely uses caffeine.    Her Allergies Are:  Allergies  Allergen Reactions  . Hydrocodone-Acetaminophen Nausea And Vomiting  :   Her Current Medications Are:  Outpatient Encounter Medications as of 02/23/2018  Medication Sig  . ACCU-CHEK AVIVA PLUS test strip USE  TO CHECK BLOOD SUGAR THREE TIMES DAILY  . ACCU-CHEK SOFTCLIX LANCETS lancets USE  TO CHECK BLOOD SUGAR THREE TIMES DAILY  . albuterol (PROVENTIL HFA;VENTOLIN HFA) 108 (90 Base) MCG/ACT inhaler Inhale 1-2 puffs into the lungs every 6 (six) hours as needed for wheezing.  Marland Kitchen albuterol (PROVENTIL) (2.5 MG/3ML) 0.083% nebulizer solution Take 3 mLs (2.5 mg total) by nebulization every 4 (four) hours as needed for wheezing.  . Alcohol Swabs (B-D SINGLE USE SWABS REGULAR) PADS USE  TO  CLEAN  SKIN  BEFORE CHECKING BLOOD SUGAR  . alendronate (FOSAMAX) 70 MG tablet TAKE 1 TABLET EVERY 7 DAYS  . aspirin EC 81 MG tablet Take 81 mg by mouth daily.  Marland Kitchen atorvastatin (LIPITOR) 10 MG tablet Take 1 tablet (10 mg total) by mouth daily.  . beclomethasone (QVAR REDIHALER) 40 MCG/ACT inhaler Inhale 2 puffs into the lungs 2 (two) times daily.  . calcium citrate-vitamin D (CITRACAL+D) 315-200 MG-UNIT tablet Take 1 tablet by  mouth daily.  . clotrimazole (LOTRIMIN) 1 % cream Apply 1 application topically 2 (two) times daily. (Patient taking differently: Apply 1 application topically 2 (two) times daily as needed (skin care). )  . cyclobenzaprine (FLEXERIL) 10 MG tablet Take 1 tablet (10 mg total) by mouth 3 (three) times daily.  Marland Kitchen docusate sodium (COLACE) 100 MG capsule Take 100 mg by mouth daily.  . DULoxetine (CYMBALTA) 60 MG capsule Take 1 capsule (60 mg total) by mouth daily.  Marland Kitchen gabapentin (NEURONTIN) 800 MG tablet Take 1 tablet (800 mg total) by mouth 3 (three) times daily.  Marland Kitchen  lisinopril (PRINIVIL,ZESTRIL) 10 MG tablet Take 10 mg by mouth daily.  . meclizine (ANTIVERT) 12.5 MG tablet TAKE 1 TABLET 3 TIMES DAILY AS NEEDED FOR DIZZINESS.  . meloxicam (MOBIC) 15 MG tablet TAKE 1 TABLET EVERY DAY  . metFORMIN (GLUCOPHAGE) 1000 MG tablet Take 1 tablet (1,000 mg total) by mouth 2 (two) times daily with a meal.  . Multiple Vitamins-Minerals ([employer] SPECTRAVITE WOMENS SENIOR PO) Take by mouth daily.  Marland Kitchen omeprazole (PRILOSEC) 20 MG capsule TAKE 1 CAPSULE EVERY DAY  . ondansetron (ZOFRAN) 4 MG tablet TAKE 1 TABLET EVERY 8 HOURS AS NEEDED  FOR NAUSEA  AND  VOMITING  . predniSONE (DELTASONE) 20 MG tablet Take 40 mg by mouth daily for 3 days, then 20mg  by mouth daily for 3 days, then 10mg  daily for 3 days  . Psyllium (METAMUCIL PO) Take 1 scoop by mouth daily.   No facility-administered encounter medications on file as of 02/23/2018.   :  Review of Systems:  Out of a complete 14 point review of systems, all are reviewed and negative with the exception of these symptoms as listed below: Review of Systems  Neurological:       Pt presents today because she is having very frequent episodes where she is nodding off and dozing off. She has very broken sleep at bedtime. Pt wakes up gasping for air and snores in her sleep. She has never had a sleep study before.  Epworth Sleepiness Scale 0= would never doze 1= slight chance of  dozing 2= moderate chance of dozing 3= high chance of dozing  Sitting and reading:3 Watching TV:3 Sitting inactive in a public place (ex. Theater or meeting):2 As a passenger in a car for an hour without a break:3 Lying down to rest in the afternoon:3 Sitting and talking to someone:3 Sitting quietly after lunch (no alcohol):3 In a car, while stopped in traffic:2 Total:22     Objective:  Neurological Exam  Physical Exam Physical Examination:   Vitals:   02/23/18 1552  BP: 119/71  Pulse: 97    General Examination: The patient is a very pleasant 62 y.o. female in no acute distress. She appears well-developed and well-nourished and well groomed.   HEENT: Normocephalic, atraumatic, pupils are equal, round and reactive to light and accommodation. Extraocular tracking is good without limitation to gaze excursion or nystagmus noted. Normal smooth pursuit is noted. Hearing is grossly intact. Face is symmetric with normal facial animation and normal facial sensation. Speech is clear with no dysarthria noted. There is no hypophonia. There is no lip, neck/head, jaw or voice tremor. Neck is supple with full range of passive and active motion. There are no carotid bruits on auscultation. Oropharynx exam reveals: moderate mouth dryness, poor dental hygiene with near-edentulous state and moderate airway crowding, due to thicker soft palate, tonsils in place of 1-2+ bilaterally. Mallampati is class II. Neck circumference is 15-7/8 inches. Tongue protrudes centrally and palate elevates symmetrically.   Chest: Clear to auscultation without wheezing, rhonchi or crackles noted.  Heart: S1+S2+0, regular and normal without murmurs, rubs or gallops noted.   Abdomen: Soft, non-tender and non-distended with normal bowel sounds appreciated on auscultation.  Extremities: There is 2+ pitting edema in the distal lower extremities bilaterally. Pedal pulses are intact.  Skin: Warm and dry without trophic  changes noted.  Musculoskeletal: exam reveals b/l knee pain.   Neurologically:  Mental status: The patient is awake, alert and oriented in all 4 spheres. Her immediate and remote memory, attention,  language skills and fund of knowledge are fairly appropriate.  Cranial nerves II - XII are as described above under HEENT exam.  Motor exam: Normal bulk, strength and tone is noted. There is no drift, tremor or rebound. Romberg is not tested d/t safety concerns. Reflexes are trace in the UEs and absent in the LEs. Fine motor skills and coordination: grossly intact.  Cerebellar testing: No dysmetria or intention tremor.  Sensory exam: intact to light touch in the upper and lower extremities.  Gait, station and balance: She stands with difficulty and pushes herself. She walks slowly with a 4 prong cane.               Assessment and Plan:   In summary, [name] is a very pleasant 62 y.o.-year old female with an underlying medical history of hypertension, reflux disease, arthritis, allergies, history of vertigo, history of stroke, osteoporosis, memory loss, and obesity, whose history and physical exam are concerning for obstructive sleep apnea (OSA).  I had a long chat with the patient about my findings and the diagnosis of OSA, its prognosis and treatment options. We talked about medical treatments, surgical interventions and non-pharmacological approaches. I explained in particular the risks and ramifications of untreated moderate to severe OSA, especially with respect to developing cardiovascular disease down the Road, including congestive heart failure, difficult to treat hypertension, cardiac arrhythmias, or stroke. Even type 2 diabetes has, in part, been linked to untreated OSA. Symptoms of untreated OSA include daytime sleepiness, memory problems, mood irritability and mood disorder such as depression and anxiety, lack of energy, as well as recurrent headaches, especially morning headaches. We  talked about trying to maintain a healthy lifestyle in general, as well as the importance of weight control. I encouraged the patient to eat healthy, exercise daily and keep well hydrated, to keep a scheduled bedtime and wake time routine, to not skip any meals and eat healthy snacks in between meals. I advised the patient not to drive when feeling sleepy. I recommended the following at this time: sleep study with potential positive airway pressure titration. (We will score hypopneas at 4%).   I explained the sleep test procedure to the patient and also outlined possible surgical and non-surgical treatment options of OSA, including the use of a custom-made dental device (which would require a referral to a specialist dentist or oral surgeon), upper airway surgical options, such as pillar implants, radiofrequency surgery, tongue base surgery, and UPPP (which would involve a referral to an ENT surgeon). Rarely, jaw surgery such as mandibular advancement may be considered.  I also explained the CPAP treatment option to the patient, who indicated that she would be willing to try CPAP if the need arises. I explained the importance of being compliant with PAP treatment, not only for insurance purposes but primarily to improve Her symptoms, and for the patient's long term health benefit, including to reduce Her cardiovascular risks. I answered all her questions today and the patient was in agreement. I plan to see her back after the sleep study is completed and encouraged her to call with any interim questions, concerns, problems or updates.   Thank you very much for allowing me to participate in the care of this nice patient. If I can be of any further assistance to you please do not hesitate to call me at [phone].  Sincerely,   Star Age, MD, PhD

## 2018-02-23 NOTE — Patient Instructions (Signed)

## 2018-03-09 ENCOUNTER — Ambulatory Visit (INDEPENDENT_AMBULATORY_CARE_PROVIDER_SITE_OTHER): Payer: Medicare HMO | Admitting: Neurology

## 2018-03-09 DIAGNOSIS — R600 Localized edema: Secondary | ICD-10-CM

## 2018-03-09 DIAGNOSIS — E669 Obesity, unspecified: Secondary | ICD-10-CM

## 2018-03-09 DIAGNOSIS — R0681 Apnea, not elsewhere classified: Secondary | ICD-10-CM

## 2018-03-09 DIAGNOSIS — G4733 Obstructive sleep apnea (adult) (pediatric): Secondary | ICD-10-CM

## 2018-03-09 DIAGNOSIS — R351 Nocturia: Secondary | ICD-10-CM

## 2018-03-09 DIAGNOSIS — G4720 Circadian rhythm sleep disorder, unspecified type: Secondary | ICD-10-CM

## 2018-03-09 DIAGNOSIS — R400 Somnolence: Secondary | ICD-10-CM

## 2018-03-09 DIAGNOSIS — R0683 Snoring: Secondary | ICD-10-CM

## 2018-03-09 DIAGNOSIS — R51 Headache: Secondary | ICD-10-CM

## 2018-03-09 DIAGNOSIS — R519 Headache, unspecified: Secondary | ICD-10-CM

## 2018-03-11 ENCOUNTER — Emergency Department: Payer: Medicare HMO

## 2018-03-11 ENCOUNTER — Emergency Department
Admission: EM | Admit: 2018-03-11 | Discharge: 2018-03-11 | Disposition: A | Discharge status: Home / Self Care | Payer: Medicare HMO | Attending: Emergency Medicine | Admitting: Emergency Medicine

## 2018-03-11 ENCOUNTER — Encounter: Payer: Self-pay | Admitting: *Deleted

## 2018-03-11 ENCOUNTER — Other Ambulatory Visit: Payer: Self-pay

## 2018-03-11 ENCOUNTER — Telehealth: Payer: Self-pay

## 2018-03-11 DIAGNOSIS — Z7982 Long term (current) use of aspirin: Secondary | ICD-10-CM | POA: Diagnosis not present

## 2018-03-11 DIAGNOSIS — Z7984 Long term (current) use of oral hypoglycemic drugs: Secondary | ICD-10-CM | POA: Insufficient documentation

## 2018-03-11 DIAGNOSIS — Z79899 Other long term (current) drug therapy: Secondary | ICD-10-CM | POA: Insufficient documentation

## 2018-03-11 DIAGNOSIS — E119 Type 2 diabetes mellitus without complications: Secondary | ICD-10-CM | POA: Diagnosis not present

## 2018-03-11 DIAGNOSIS — I10 Essential (primary) hypertension: Secondary | ICD-10-CM | POA: Insufficient documentation

## 2018-03-11 DIAGNOSIS — M1712 Unilateral primary osteoarthritis, left knee: Secondary | ICD-10-CM | POA: Diagnosis not present

## 2018-03-11 DIAGNOSIS — M25562 Pain in left knee: Secondary | ICD-10-CM | POA: Diagnosis present

## 2018-03-11 MED ORDER — KETOROLAC TROMETHAMINE 10 MG PO TABS: 10.0000 mg | ORAL_TABLET | Freq: Four times a day (QID) | ORAL | 0 refills | Status: AC | PRN

## 2018-03-11 MED ORDER — KETOROLAC TROMETHAMINE 30 MG/ML IJ SOLN
30.0000 mg | Freq: Once | INTRAMUSCULAR | Status: AC
  Administered 2018-03-11: 30 mg via INTRAMUSCULAR
  Filled 2018-03-11: qty 1

## 2018-03-11 NOTE — Telephone Encounter (Signed)
I called pt to discuss her sleep study results. No answer, left a message for pt to call me back.

## 2018-03-11 NOTE — ED Triage Notes (Signed)
Pt has left knee pain.  Pt fell 1 week ago. No deformity noted.  Pt reports pain with ambulation.

## 2018-03-11 NOTE — Progress Notes (Signed)
Patient referred by Dr. Wendy Poet, seen by me on 02/23/18, diagnostic PSG on 03/09/18.   Please call and notify the patient that the recent sleep study did not show any significant obstructive sleep apnea with the exception of snoring and evidence of REM related sleep apnea. For this, positive airway treatment with CPAP or autoPAP is not warranted; weight loss and avoidance of the supine sleep position will likely suffice as treatment. Of note, the near-absence of REM sleep likely underestimates her sleep disordered breathing. For disturbing snoring, an oral appliance (through a qualified dentist) can be considered. The patient is taking several sedation medications; her sleepiness may very well be, in part, due to medication effect. She had a lot of deep/slow wave sleep.  Please remind patient to try to maintain good sleep hygiene, which means: Keep a regular sleep and wake schedule and make enough time for sleep (7 1/2 to 8 1/2 hours for the average adult), try not to exercise or have a meal within 2 hours of your bedtime, try to keep your bedroom conducive for sleep, that is, cool and dark, without light distractors such as an illuminated alarm clock, and refrain from watching TV right before sleep or in the middle of the night and do not keep the TV or radio on during the night. If a nightlight is used, have it away from the visual field. Also, try not to use or play on electronic devices at bedtime, such as your cell phone, tablet PC or laptop. If you like to read at bedtime on an electronic device, try to dim the background light as much as possible. Do not eat in the middle of the night. Keep pets away from the bedroom environment. For stress relief, try meditation, deep breathing exercises (there are many books and CDs available), a white noise machine or fan can help to diffuse other noise distractors, such as traffic noise. Do not drink alcohol before bedtime, as it can disturb sleep and cause middle of  the night awakenings. Never mix alcohol and sedating medications! Avoid narcotic pain medication close to bedtime, as opioids/narcotics can suppress breathing drive and breathing effort.   She can FU with her ref provider and PCP. May be of benefit to review her medication regimen with PCP.   Star Age, MD, PhD [facility])

## 2018-03-11 NOTE — Telephone Encounter (Signed)
I called pt. I advised pt that Dr. Rexene Alberts reviewed pt's sleep study and found that pt did not show any significant osa with the exception of snoring and evidence of REM related sleep apnea, for which PAP therapy is not warranted. However, the near absence of REM sleep likely underestimates her sleep disordered breathing. Pt had some snoring noted during the sleep study and if this snoring is disturbing to the pt, Dr. Rexene Alberts recommends that pt consider an oral appliance to treat snoring. The oral appliance should be made by a qualified dentist. Dr. Rexene Alberts recommends that pt pursue weight loss and avoid sleeping on her back. I reviewed sleep hygiene recommendations with the pt, including trying to keep a regular sleep wake schedule, avoiding electronics in the bedroom, keeping the bedroom cool, dark, and quiet, and avoiding eating or exercising within 2 hours of bedtime as well as eating in the middle of the night. I advised pt to keep pets out of the bedroom. I discussed with pt the importance of stress relief and to try meditation, deep breathing exercises, and/or a white noise machine or fan to diffuse other noise distractors. I advised pt to not drink alcohol before bedtime and to never mix alcohol and sedating medications. Pt was advised to avoid narcotic pain medication close to bedtime. I advised pt that a copy of these sleep study results will be sent to Dr. McDiarmid. Pt verbalized understanding of results. Pt had no questions at this time but was encouraged to call back if questions arise.

## 2018-03-11 NOTE — Telephone Encounter (Signed)
-----   Message from Star Age, MD sent at 03/11/2018  7:48 AM EDT ----- Patient referred by Dr. McDiarmid, seen by me on 02/23/18, diagnostic PSG on 03/09/18.   Please call and notify the patient that the recent sleep study did not show any significant obstructive sleep apnea with the exception of snoring and evidence of REM related sleep apnea. For this, positive airway treatment with CPAP or autoPAP is not warranted; weight loss and avoidance of the supine sleep position will likely suffice as treatment. Of note, the near-absence of REM sleep likely underestimates her sleep disordered breathing. For disturbing snoring, an oral appliance (through a qualified dentist) can be considered. The patient is taking several sedation medications; her sleepiness may very well be, in part, due to medication effect. She had a lot of deep/slow wave sleep.  Please remind patient to try to maintain good sleep hygiene, which means: Keep a regular sleep and wake schedule and make enough time for sleep (7 1/2 to 8 1/2 hours for the average adult), try not to exercise or have a meal within 2 hours of your bedtime, try to keep your bedroom conducive for sleep, that is, cool and dark, without light distractors such as an illuminated alarm clock, and refrain from watching TV right before sleep or in the middle of the night and do not keep the TV or radio on during the night. If a nightlight is used, have it away from the visual field. Also, try not to use or play on electronic devices at bedtime, such as your cell phone, tablet PC or laptop. If you like to read at bedtime on an electronic device, try to dim the background light as much as possible. Do not eat in the middle of the night. Keep pets away from the bedroom environment. For stress relief, try meditation, deep breathing exercises (there are many books and CDs available), a white noise machine or fan can help to diffuse other noise distractors, such as traffic noise. Do not  drink alcohol before bedtime, as it can disturb sleep and cause middle of the night awakenings. Never mix alcohol and sedating medications! Avoid narcotic pain medication close to bedtime, as opioids/narcotics can suppress breathing drive and breathing effort.   She can FU with her ref provider and PCP. May be of benefit to review her medication regimen with PCP.   Star Age, MD, PhD [facility])

## 2018-03-11 NOTE — Procedures (Signed)
PATIENT'S NAME:  [name], [name] DOB:      October 10, 1955      MR#:    [id number]     DATE OF RECORDING: 03/09/2018 REFERRING M.D.:  Sherren Mocha McDiarmid, MD Study Performed:   Baseline Polysomnogram HISTORY: 61 year old woman with a history of hypertension, reflux disease, arthritis, allergies, history of vertigo, history of stroke, osteoporosis, memory loss, and obesity, who reports snoring and excessive daytime somnolence. The patient endorsed the Epworth Sleepiness Scale at 22 points. The patient's weight 247 pounds with a height of 66 (inches), resulting in a BMI of 39.7 kg/m2. The patient's neck circumference measured 15.8 inches.  CURRENT MEDICATIONS: Proventil, Fosamax, Aspirin, Lipitor, Qvar, Flexeril, Colace, Cymbalta, Neurontin, Prinivil, Antivert, Mobic, Metformin, Prilosec, Zofran, Deltasone.   PROCEDURE:  This is a multichannel digital polysomnogram utilizing the Somnostar 11.2 system.  Electrodes and sensors were applied and monitored per AASM Specifications.   EEG, EOG, Chin and Limb EMG, were sampled at 200 Hz.  ECG, Snore and Nasal Pressure, Thermal Airflow, Respiratory Effort, CPAP Flow and Pressure, Oximetry was sampled at 50 Hz. Digital video and audio were recorded.      BASELINE STUDY  Lights Out was at 22:24 and Lights On at 05:00.  Total recording time (TRT) was 396.5 minutes, with a total sleep time (TST) of 360.5 minutes.   The patient's sleep latency was 15.5 minutes, which is normal, REM latency was 302 minutes, which is markedly delayed. The sleep efficiency was 90.9%.     SLEEP ARCHITECTURE: WASO (Wake after sleep onset) was 28 minutes.  There were 6 minutes in Stage N1, 7.5 minutes Stage N2, 341.5 minutes Stage N3 and 5.5 minutes in Stage REM.  The percentage of Stage N1 was 1.7%, Stage N2 was 2.1%, Stage N3 was 94.7%, which is markedly delayed and Stage R (REM sleep) was 1.5%, which is markedly reduced. The arousals were noted as: 14 were spontaneous, 4 were associated with PLMs, 0  were associated with respiratory events.  RESPIRATORY ANALYSIS:  There were a total of 2 respiratory events:  0 obstructive apneas, 0 central apneas and 0 mixed apneas with a total of 0 apneas and an apnea index (AI) of 0 /hour. There were 2 hypopneas with a hypopnea index of .3 /hour. The patient also had 0 respiratory event related arousals (RERAs).      The total APNEA/HYPOPNEA INDEX (AHI) was 0.3 /hour and the total RESPIRATORY DISTURBANCE INDEX was .3 /hour.  2 events occurred in REM sleep and 0 events in NREM. The REM AHI was 21.8/hour, versus a non-REM AHI of 0. The patient spent 86 minutes of total sleep time in the supine position and 275 minutes in non-supine.. The supine AHI was 0.0 versus a non-supine AHI of 0.4.  OXYGEN SATURATION & C02:  The Wake baseline 02 saturation was 95%, with the lowest being 83%. Time spent below 89% saturation equaled 7 minutes (which is overestimated due to loss of sensor for a few minutes). PERIODIC LIMB MOVEMENTS: The patient had a total of 6 Periodic Limb Movements.  The Periodic Limb Movement (PLM) index was 1. and the PLM Arousal index was .7/hour.  Audio and video analysis did not show any abnormal or unusual movements, behaviors, phonations or vocalizations. The patient was noted to be quite restless. The patient took 2 bathroom breaks. Mild to moderate snoring was noted. The EKG was in keeping with normal sinus rhythm (NSR).  Post-study, the patient indicated that sleep was better than usual.   IMPRESSION:  1. Primary Snoring 2. Dysfunctions associated with sleep stages or arousal from sleep  RECOMMENDATIONS:  1. This study does not demonstrate any significant obstructive or central sleep disordered breathing with the exception of snoring and evidence of REM related sleep apnea. For this, positive airway treatment with CPAP or autoPAP is not warranted; weight loss and avoidance of the supine sleep position will likely suffice as treatment. Of note,  the near-absence of REM sleep likely underestimates her sleep disordered breathing. For disturbing snoring, an oral appliance (through a qualified dentist) can be considered. This study does not support an intrinsic sleep disorder as a cause of the patient's symptoms. Other causes, including circadian rhythm disturbances, an underlying mood disorder, medication effect and/or an underlying medical problem cannot be ruled out.  2. This study shows abnormal sleep stage percentages; these are nonspecific findings and per se do not signify an intrinsic sleep disorder or a cause for the patient's sleep-related symptoms. Causes include (but are not limited to) the first night effect of the sleep study, circadian rhythm disturbances, medication effect or an underlying mood disorder or medical problem. The patient is taking several sedation medications; her sleepiness may very well be, in part, due to medication effect.  3. The patient should be cautioned not to drive, work at heights, or operate dangerous or heavy equipment when tired or sleepy. Review and reiteration of good sleep hygiene measures should be pursued with any patient. 4. The patient will be advised to follow up with the referring provider, who will be notified of the test results.  I certify that I have reviewed the entire raw data recording prior to the issuance of this report in accordance with the Standards of Accreditation of the American Academy of Sleep Medicine (AASM)   Star Age, MD, PhD Diplomat, American Board of Neurology and Sleep Medicine (Neurology and Sleep Medicine)

## 2018-03-11 NOTE — ED Provider Notes (Signed)
[facility] Emergency Department Provider Note  ____________________________________________  Time seen: Approximately 5:51 PM  I have reviewed the triage vital signs and the nursing notes.   HISTORY  Chief Complaint Knee Pain    HPI [name] is a 62 y.o. female that presents emergency department for evaluation of knee pain for 1 week.  Patient states that she fell 1 week ago and landed on her left knee.  Pain is primarily over the front of her knee.  Pain is worse when she tries to bend her knee.  She has taken ibuprofen for pain and has iced knee, with some relief.  She has been walking, but with pain.  No additional pain or injuries.   Past Medical History:  Diagnosis Date  . Allergy   . Arthritis   . Diabetes mellitus, type 2 ([place])   . Excessive daytime sleepiness 11/12/2017  . GERD (gastroesophageal reflux disease)   . Goiter   . Hypertension   . Kidney stone   . Memory loss   . 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 Osteoporosis    in lower back per pt   . Plantar fasciitis   . Snoring 08/28/2017  . Stroke [facility])    residual left sided weakness  . Tremors of nervous system   . Vertigo     Patient Active Problem List   Diagnosis Date Noted  . Shortness of breath 12/25/2017  . Excessive daytime sleepiness 11/12/2017  . Snoring 08/28/2017  . 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 Abnormal TSH 08/28/2017  . Memory loss 08/10/2017  . Tinea pedis 08/10/2017  . Orthostatic hypotension 11/20/2016  . Urge incontinence 07/25/2016  . Impaired functional mobility, balance, and endurance 07/24/2016  . Falls 05/23/2016  . Warts 05/23/2016  . Chronic dental pain 02/22/2016  . 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 Disorder of sacroiliac joint 05/15/2015  . Dyslipidemia associated with type 2 diabetes mellitus ([place]) 04/26/2015  . Benign paroxysmal positional vertigo 01/24/2015   . Diabetes mellitus type 2, uncomplicated ([place]) 24/23/5361  . Vitamin D deficiency 07/11/2013  . Mood disorder ([place]) 07/11/2013  . Osteopenia 11/04/2012  . Heel pain 08/14/2011  . Atony of bladder 06/10/2010  . Uterovaginal prolapse, incomplete 06/10/2010  . VAGINITIS, ATROPHIC 05/17/2010  . 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 Chronic pain syndrome 04/26/2010  . OBESITY, NOS 09/17/2006  . GASTROESOPHAGEAL REFLUX, NO ESOPHAGITIS 09/17/2006    Past Surgical History:  Procedure Laterality Date  . APPENDECTOMY    . BLADDER SURGERY    . CATARACT EXTRACTION W/PHACO Left 05/06/2017   Procedure: CATARACT EXTRACTION PHACO AND INTRAOCULAR LENS PLACEMENT ([place]) LEFT DIABETIC;  Surgeon: Leandrew Koyanagi, MD;  Location: [place];  Service: Ophthalmology;  Laterality: Left;  Diabetic - oral meds  . CATARACT EXTRACTION W/PHACO Right 06/03/2017   Procedure: CATARACT EXTRACTION PHACO AND INTRAOCULAR LENS PLACEMENT ([place]) RIGHT DIABETIC;  Surgeon: Leandrew Koyanagi, MD;  Location: [place];  Service: Ophthalmology;  Laterality: Right;  Diabetic - oral meds  . CHOLECYSTECTOMY    . FOOT SURGERY    . TOTAL ABDOMINAL HYSTERECTOMY      Prior to Admission medications   Medication Sig Start Date End Date Taking? Authorizing Provider  ACCU-CHEK AVIVA PLUS test strip USE  TO CHECK BLOOD SUGAR THREE TIMES DAILY 12/25/16   Verner Mould, MD  ACCU-CHEK SOFTCLIX LANCETS lancets USE  TO CHECK BLOOD SUGAR THREE TIMES  DAILY 01/28/17   Verner Mould, MD  albuterol (PROVENTIL HFA;VENTOLIN HFA) 108 (90 Base) MCG/ACT inhaler Inhale 1-2 puffs into the lungs every 6 (six) hours as needed for wheezing. 12/25/17   Verner Mould, MD  albuterol (PROVENTIL) (2.5 MG/3ML) 0.083% nebulizer solution Take 3 mLs (2.5 mg total) by nebulization every 4 (four) hours as needed for wheezing. 12/25/17   Verner Mould, MD  Alcohol Swabs (B-D  SINGLE USE SWABS REGULAR) PADS USE  TO  CLEAN  SKIN  BEFORE CHECKING BLOOD SUGAR 03/30/17   Verner Mould, MD  alendronate (FOSAMAX) 70 MG tablet TAKE 1 TABLET EVERY 7 DAYS 09/29/16   Verner Mould, MD  aspirin EC 81 MG tablet Take 81 mg by mouth daily.    [provider]  atorvastatin (LIPITOR) 10 MG tablet Take 1 tablet (10 mg total) by mouth daily. 08/10/17   Verner Mould, MD  beclomethasone (QVAR REDIHALER) 40 MCG/ACT inhaler Inhale 2 puffs into the lungs 2 (two) times daily. 12/25/17   Verner Mould, MD  calcium citrate-vitamin D (CITRACAL+D) 315-200 MG-UNIT tablet Take 1 tablet by mouth daily.    [provider]  clotrimazole (LOTRIMIN) 1 % cream Apply 1 application topically 2 (two) times daily. Patient taking differently: Apply 1 application topically 2 (two) times daily as needed (skin care).  08/10/17   Verner Mould, MD  docusate sodium (COLACE) 100 MG capsule Take 100 mg by mouth daily.    [provider]  DULoxetine (CYMBALTA) 60 MG capsule Take 1 capsule (60 mg total) by mouth daily. 08/10/17   Verner Mould, MD  gabapentin (NEURONTIN) 800 MG tablet Take 1 tablet (800 mg total) by mouth 3 (three) times daily. 08/10/17   Verner Mould, MD  ketorolac (TORADOL) 10 MG tablet Take 1 tablet (10 mg total) by mouth every 6 (six) hours as needed for up to 3 days. 03/11/18 03/14/18  Laban Emperor, PA-C  lisinopril (PRINIVIL,ZESTRIL) 10 MG tablet Take 10 mg by mouth daily.    [provider]  meclizine (ANTIVERT) 12.5 MG tablet TAKE 1 TABLET 3 TIMES DAILY AS NEEDED FOR DIZZINESS. 01/25/18   Mullis, Kiersten P, DO  meloxicam (MOBIC) 15 MG tablet TAKE 1 TABLET EVERY DAY 07/17/17   Verner Mould, MD  metFORMIN (GLUCOPHAGE) 1000 MG tablet Take 1 tablet (1,000 mg total) by mouth 2 (two) times daily with a meal. 08/10/17   Verner Mould, MD  Multiple Vitamins-Minerals ([employer]  SPECTRAVITE WOMENS SENIOR PO) Take by mouth daily.    [provider]  omeprazole (PRILOSEC) 20 MG capsule TAKE 1 CAPSULE EVERY DAY 07/17/17   Verner Mould, MD  ondansetron (ZOFRAN) 4 MG tablet TAKE 1 TABLET EVERY 8 HOURS AS NEEDED  FOR NAUSEA  AND  VOMITING 01/11/18   Verner Mould, MD  predniSONE (DELTASONE) 20 MG tablet Take 40 mg by mouth daily for 3 days, then 20mg  by mouth daily for 3 days, then 10mg  daily for 3 days 12/13/17   Larene Pickett, PA-C  Psyllium (METAMUCIL PO) Take 1 scoop by mouth daily.    [provider]    Allergies Hydrocodone-acetaminophen  Family History  Problem Relation Age of Onset  . Healthy Mother   . Heart disease Father   . Alcohol abuse Father   . Alcohol abuse Brother   . Heart disease Sister   . Diabetes Sister   . Breast cancer Daughter   . Colon polyps Neg Hx   .  Colon cancer Neg Hx   . Esophageal cancer Neg Hx   . Rectal cancer Neg Hx   . Stomach cancer Neg Hx     Social History Social History   Tobacco Use  . Smoking status: Never Smoker  . Smokeless tobacco: Never Used  Substance Use Topics  . Alcohol use: No  . Drug use: No     Review of Systems  Constitutional: No fever/chills Cardiovascular: No chest pain. Respiratory: No SOB. Gastrointestinal: No abdominal pain.  No nausea, no vomiting.  Musculoskeletal: Positive for knee pain.  Skin: Negative for rash, abrasions, lacerations, ecchymosis.   ____________________________________________   PHYSICAL EXAM:  VITAL SIGNS: ED Triage Vitals  Enc Vitals Group     BP 03/11/18 1654 (!) 153/70     Pulse Rate 03/11/18 1654 92     Resp 03/11/18 1654 20     Temp 03/11/18 1654 98.4 F (36.9 C)     Temp Source 03/11/18 1654 Oral     SpO2 03/11/18 1654 97 %     Weight 03/11/18 1655 250 lb (113.4 kg)     Height 03/11/18 1655 5\' 5"  (1.651 m)     Head Circumference --      Peak Flow --      Pain Score 03/11/18 1655 8     Pain Loc --       Pain Edu? --      Excl. in [place]? --      Constitutional: Alert and oriented. Well appearing and in no acute distress. Eyes: Conjunctivae are normal. PERRL. EOMI. Head: Atraumatic. ENT:      Ears:      Nose: No congestion/rhinnorhea.      Mouth/Throat: Mucous membranes are moist.  Neck: No stridor.   Cardiovascular: Normal rate, regular rhythm.  Good peripheral circulation. Respiratory: Normal respiratory effort without tachypnea or retractions. Lungs CTAB. Good air entry to the bases with no decreased or absent breath sounds. Musculoskeletal: Full range of motion to all extremities. No gross deformities appreciated.  No swelling, erythema.  Pain with flexion of knee.  Tenderness to palpation over anterior knee. Weightbearing. Neurologic:  Normal speech and language. No gross focal neurologic deficits are appreciated.  Skin:  Skin is warm, dry and intact. No rash noted. Psychiatric: Mood and affect are normal. Speech and behavior are normal. Patient exhibits appropriate insight and judgement.   ____________________________________________   LABS (all labs ordered are listed, but only abnormal results are displayed)  Labs Reviewed - No data to display ____________________________________________  EKG   ____________________________________________  RADIOLOGY Robinette Haines, personally viewed and evaluated these images (plain radiographs) as part of my medical decision making, as well as reviewing the written report by the radiologist.  Dg Knee Complete 4 Views Left  Result Date: 03/11/2018 CLINICAL DATA:  Fall 1 week ago with left knee pain. EXAM: LEFT KNEE - COMPLETE 4+ VIEW COMPARISON:  None. FINDINGS: Mild tricompartmental osteoarthritic change. No fracture or dislocation. No definite joint effusion. IMPRESSION: No acute findings. Mild osteoarthritis. Electronically Signed   By: Marin Olp M.D.   On: 03/11/2018 17:41     ____________________________________________    PROCEDURES  Procedure(s) performed:    Procedures    Medications  ketorolac (TORADOL) 30 MG/ML injection 30 mg (30 mg Intramuscular Given 03/11/18 1913)     ____________________________________________   INITIAL IMPRESSION / ASSESSMENT AND PLAN / ED COURSE  Pertinent labs & imaging results that were available during my care of the patient were reviewed  by me and considered in my medical decision making (see chart for details).  Review of the Rule CSRS was performed in accordance of the [place] prior to dispensing any controlled drugs.     Patient's diagnosis is consistent with knee pain and osteoarthritis.  Vital signs and exam are reassuring.  X-ray consistent with osteoarthritis.  Patient is able to use walker without difficulty.  Knee was ace wrapped.  IM Toradol was given.  Patient will be discharged home with prescriptions for Toradol. Patient is to follow up with PCP as directed.  She has an appointment with her primary care provider next month.  Patient is given ED precautions to return to the ED for any worsening or new symptoms.     ____________________________________________  FINAL CLINICAL IMPRESSION(S) / ED DIAGNOSES  Final diagnoses:  Acute pain of left knee  Osteoarthritis of left knee, unspecified osteoarthritis type      NEW MEDICATIONS STARTED DURING THIS VISIT:  ED Discharge Orders         Ordered    ketorolac (TORADOL) 10 MG tablet  Every 6 hours PRN     03/11/18 1929              This chart was dictated using voice recognition software/Dragon. Despite best efforts to proofread, errors can occur which can change the meaning. Any change was purely unintentional.    Laban Emperor, PA-C 03/11/18 2058    Arta Silence, MD 03/11/18 2228

## 2018-03-25 ENCOUNTER — Other Ambulatory Visit: Payer: Self-pay

## 2018-03-25 ENCOUNTER — Encounter: Payer: Self-pay | Admitting: Emergency Medicine

## 2018-03-25 ENCOUNTER — Emergency Department
Admission: EM | Admit: 2018-03-25 | Discharge: 2018-03-25 | Disposition: A | Discharge status: Home / Self Care | Payer: Medicare HMO | Attending: Emergency Medicine | Admitting: Emergency Medicine

## 2018-03-25 ENCOUNTER — Emergency Department: Payer: Medicare HMO

## 2018-03-25 DIAGNOSIS — Z79899 Other long term (current) drug therapy: Secondary | ICD-10-CM | POA: Diagnosis not present

## 2018-03-25 DIAGNOSIS — M25562 Pain in left knee: Secondary | ICD-10-CM | POA: Diagnosis not present

## 2018-03-25 DIAGNOSIS — F0150 Vascular dementia without behavioral disturbance: Secondary | ICD-10-CM | POA: Insufficient documentation

## 2018-03-25 DIAGNOSIS — G309 Alzheimer's disease, unspecified: Secondary | ICD-10-CM | POA: Insufficient documentation

## 2018-03-25 DIAGNOSIS — Z8673 Personal history of transient ischemic attack (TIA), and cerebral infarction without residual deficits: Secondary | ICD-10-CM | POA: Diagnosis not present

## 2018-03-25 DIAGNOSIS — Z7984 Long term (current) use of oral hypoglycemic drugs: Secondary | ICD-10-CM | POA: Insufficient documentation

## 2018-03-25 DIAGNOSIS — I10 Essential (primary) hypertension: Secondary | ICD-10-CM | POA: Insufficient documentation

## 2018-03-25 DIAGNOSIS — G8929 Other chronic pain: Secondary | ICD-10-CM | POA: Insufficient documentation

## 2018-03-25 DIAGNOSIS — Z7982 Long term (current) use of aspirin: Secondary | ICD-10-CM | POA: Insufficient documentation

## 2018-03-25 DIAGNOSIS — E119 Type 2 diabetes mellitus without complications: Secondary | ICD-10-CM | POA: Insufficient documentation

## 2018-03-25 MED ORDER — PREDNISONE 50 MG PO TABS: ORAL_TABLET | ORAL | 0 refills | Status: DC

## 2018-03-25 NOTE — ED Triage Notes (Signed)
Pt reports that she fell about 2 weeks ago, she came here and was seen, she reports that her knee is still hurting. She states that when she gets up on it the pain shoots down her leg. She is able to bear weight and ambulates with slow steady gait.

## 2018-03-25 NOTE — ED Provider Notes (Signed)
[facility] Emergency Department Provider Note  ____________________________________________  Time seen: Approximately 10:08 PM  I have reviewed the triage vital signs and the nursing notes.   HISTORY  Chief Complaint Knee Pain    HPI [name] is a 62 y.o. female presents to the emergency department with 7 out of 10 aching left knee pain that is worsened with ambulation.  Patient describes her pain as "shooting and sharp".  Patient reports that she fell approximately 2 weeks ago and was seen at [facility] and had x-rays taken.  Patient reports that since that time, patient has fallen again on her left knee and became concerned.  Patient has not been under the care of orthopedics.  She denies numbness and tingling of the lower extremities.  No alleviating measures have been attempted.   Past Medical History:  Diagnosis Date  . Allergy   . Arthritis   . Diabetes mellitus, type 2 ([place])   . Excessive daytime sleepiness 11/12/2017  . GERD (gastroesophageal reflux disease)   . Goiter   . Hypertension   . Kidney stone   . Memory loss   . 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 Osteoporosis    in lower back per pt   . Plantar fasciitis   . Snoring 08/28/2017  . Stroke [facility])    residual left sided weakness  . Tremors of nervous system   . Vertigo     Patient Active Problem List   Diagnosis Date Noted  . Shortness of breath 12/25/2017  . Excessive daytime sleepiness 11/12/2017  . Snoring 08/28/2017  . 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 Abnormal TSH 08/28/2017  . Memory loss 08/10/2017  . Tinea pedis 08/10/2017  . Orthostatic hypotension 11/20/2016  . Urge incontinence 07/25/2016  . Impaired functional mobility, balance, and endurance 07/24/2016  . Falls 05/23/2016  . Warts 05/23/2016  . Chronic dental pain 02/22/2016  . Spondylosis of lumbar region without myelopathy or radiculopathy  05/15/2015  . Disorder of sacroiliac joint 05/15/2015  . Dyslipidemia associated with type 2 diabetes mellitus ([place]) 04/26/2015  . Benign paroxysmal positional vertigo 01/24/2015  . Diabetes mellitus type 2, uncomplicated ([place]) 84/69/6295  . Vitamin D deficiency 07/11/2013  . Mood disorder ([place]) 07/11/2013  . Osteopenia 11/04/2012  . Heel pain 08/14/2011  . [place] of bladder 06/10/2010  . Uterovaginal prolapse, incomplete 06/10/2010  . VAGINITIS, ATROPHIC 05/17/2010  . 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 Chronic pain syndrome 04/26/2010  . OBESITY, NOS 09/17/2006  . GASTROESOPHAGEAL REFLUX, NO ESOPHAGITIS 09/17/2006    Past Surgical History:  Procedure Laterality Date  . APPENDECTOMY    . BLADDER SURGERY    . CATARACT EXTRACTION W/PHACO Left 05/06/2017   Procedure: CATARACT EXTRACTION PHACO AND INTRAOCULAR LENS PLACEMENT ([place]) LEFT DIABETIC;  Surgeon: Leandrew Koyanagi, MD;  Location: [place];  Service: Ophthalmology;  Laterality: Left;  Diabetic - oral meds  . CATARACT EXTRACTION W/PHACO Right 06/03/2017   Procedure: CATARACT EXTRACTION PHACO AND INTRAOCULAR LENS PLACEMENT ([place]) RIGHT DIABETIC;  Surgeon: Leandrew Koyanagi, MD;  Location: [place];  Service: Ophthalmology;  Laterality: Right;  Diabetic - oral meds  . CHOLECYSTECTOMY    . FOOT SURGERY    . TOTAL ABDOMINAL HYSTERECTOMY      Prior to Admission medications   Medication Sig Start Date End Date Taking? Authorizing Provider  ACCU-CHEK AVIVA PLUS test strip USE  TO CHECK BLOOD SUGAR THREE TIMES DAILY 12/25/16  Verner Mould, MD  ACCU-CHEK SOFTCLIX LANCETS lancets USE  TO CHECK BLOOD SUGAR THREE TIMES DAILY 01/28/17   Verner Mould, MD  albuterol (PROVENTIL HFA;VENTOLIN HFA) 108 (90 Base) MCG/ACT inhaler Inhale 1-2 puffs into the lungs every 6 (six) hours as needed for wheezing. 12/25/17   Verner Mould, MD  albuterol (PROVENTIL) (2.5  MG/3ML) 0.083% nebulizer solution Take 3 mLs (2.5 mg total) by nebulization every 4 (four) hours as needed for wheezing. 12/25/17   Verner Mould, MD  Alcohol Swabs (B-D SINGLE USE SWABS REGULAR) PADS USE  TO  CLEAN  SKIN  BEFORE CHECKING BLOOD SUGAR 03/30/17   Verner Mould, MD  alendronate (FOSAMAX) 70 MG tablet TAKE 1 TABLET EVERY 7 DAYS 09/29/16   Verner Mould, MD  aspirin EC 81 MG tablet Take 81 mg by mouth daily.    [provider]  atorvastatin (LIPITOR) 10 MG tablet Take 1 tablet (10 mg total) by mouth daily. 08/10/17   Verner Mould, MD  beclomethasone (QVAR REDIHALER) 40 MCG/ACT inhaler Inhale 2 puffs into the lungs 2 (two) times daily. 12/25/17   Verner Mould, MD  calcium citrate-vitamin D (CITRACAL+D) 315-200 MG-UNIT tablet Take 1 tablet by mouth daily.    [provider]  clotrimazole (LOTRIMIN) 1 % cream Apply 1 application topically 2 (two) times daily. Patient taking differently: Apply 1 application topically 2 (two) times daily as needed (skin care).  08/10/17   Verner Mould, MD  docusate sodium (COLACE) 100 MG capsule Take 100 mg by mouth daily.    [provider]  DULoxetine (CYMBALTA) 60 MG capsule Take 1 capsule (60 mg total) by mouth daily. 08/10/17   Verner Mould, MD  gabapentin (NEURONTIN) 800 MG tablet Take 1 tablet (800 mg total) by mouth 3 (three) times daily. 08/10/17   Verner Mould, MD  lisinopril (PRINIVIL,ZESTRIL) 10 MG tablet Take 10 mg by mouth daily.    [provider]  meclizine (ANTIVERT) 12.5 MG tablet TAKE 1 TABLET 3 TIMES DAILY AS NEEDED FOR DIZZINESS. 01/25/18   Mullis, Kiersten P, DO  meloxicam (MOBIC) 15 MG tablet TAKE 1 TABLET EVERY DAY 07/17/17   Verner Mould, MD  metFORMIN (GLUCOPHAGE) 1000 MG tablet Take 1 tablet (1,000 mg total) by mouth 2 (two) times daily with a meal. 08/10/17   Verner Mould, MD  Multiple  Vitamins-Minerals ([employer] SPECTRAVITE WOMENS SENIOR PO) Take by mouth daily.    [provider]  omeprazole (PRILOSEC) 20 MG capsule TAKE 1 CAPSULE EVERY DAY 07/17/17   Verner Mould, MD  ondansetron (ZOFRAN) 4 MG tablet TAKE 1 TABLET EVERY 8 HOURS AS NEEDED  FOR NAUSEA  AND  VOMITING 01/11/18   Verner Mould, MD  predniSONE (DELTASONE) 50 MG tablet Take one 50 mg tablet once daily for the next five days. 03/25/18   Lannie Fields, PA-C  Psyllium (METAMUCIL PO) Take 1 scoop by mouth daily.    [provider]    Allergies Hydrocodone-acetaminophen  Family History  Problem Relation Age of Onset  . Healthy Mother   . Heart disease Father   . Alcohol abuse Father   . Alcohol abuse Brother   . Heart disease Sister   . Diabetes Sister   . Breast cancer Daughter   . Colon polyps Neg Hx   . Colon cancer Neg Hx   . Esophageal cancer Neg Hx   . Rectal cancer Neg Hx   . Stomach  cancer Neg Hx     Social History Social History   Tobacco Use  . Smoking status: Never Smoker  . Smokeless tobacco: Never Used  Substance Use Topics  . Alcohol use: No  . Drug use: No     Review of Systems  Constitutional: No fever/chills Eyes: No visual changes. No discharge ENT: No upper respiratory complaints. Cardiovascular: no chest pain. Respiratory: no cough. No SOB. Gastrointestinal: No abdominal pain.  No nausea, no vomiting.  No diarrhea.  No constipation. Musculoskeletal: Patient has left knee pain.  Skin: Negative for rash, abrasions, lacerations, ecchymosis. Neurological: Negative for headaches, focal weakness or numbness.   ____________________________________________   PHYSICAL EXAM:  VITAL SIGNS: ED Triage Vitals  Enc Vitals Group     BP 03/25/18 1915 (!) 155/76     Pulse Rate 03/25/18 1915 97     Resp 03/25/18 1915 18     Temp 03/25/18 1915 98 F (36.7 C)     Temp Source 03/25/18 1915 Oral     SpO2 03/25/18 1915 98 %     Weight 03/25/18  1916 250 lb (113.4 kg)     Height 03/25/18 1916 5\' 5"  (1.651 m)     Head Circumference --      Peak Flow --      Pain Score 03/25/18 1915 8     Pain Loc --      Pain Edu? --      Excl. in [place]? --      Constitutional: Alert and oriented. Well appearing and in no acute distress. Eyes: Conjunctivae are normal. PERRL. EOMI. Head: Atraumatic. Cardiovascular: Normal rate, regular rhythm. Normal S1 and S2.  Good peripheral circulation. Respiratory: Normal respiratory effort without tachypnea or retractions. Lungs CTAB. Good air entry to the bases with no decreased or absent breath sounds. Musculoskeletal: Left knee appears mildly edematous.  Left knee: Negative anterior and posterior drawer test.  No laxity with MCL or LCL testing.  Negative anterior and posterior drawer test.  Palpable dorsalis pedis pulse, left. Neurologic:  Normal speech and language. No gross focal neurologic deficits are appreciated.  Skin:  Skin is warm, dry and intact. No rash noted. Psychiatric: Mood and affect are normal. Speech and behavior are normal. Patient exhibits appropriate insight and judgement.   ____________________________________________   LABS (all labs ordered are listed, but only abnormal results are displayed)  Labs Reviewed - No data to display ____________________________________________  EKG   ____________________________________________  RADIOLOGY I personally viewed and evaluated these images as part of my medical decision making, as well as reviewing the written report by the radiologist.    Dg Knee Complete 4 Views Left  Result Date: 03/25/2018 CLINICAL DATA:  [place] 2 weeks ago. Still having knee pain. She is able to bear weight. EXAM: LEFT KNEE - COMPLETE 4+ VIEW COMPARISON:  03/11/2018 FINDINGS: Degenerative changes in the left knee with medial compartment narrowing and small tricompartment osteophyte formation. No significant effusion. No evidence of acute fracture or dislocation.  No focal bone lesion or bone destruction. No patellar dislocation. Soft tissues are unremarkable. IMPRESSION: Degenerative changes in the left knee. No acute bony abnormalities. Electronically Signed   By: Lucienne Capers M.D.   On: 03/25/2018 22:51    ____________________________________________    PROCEDURES  Procedure(s) performed:    Procedures    Medications - No data to display   ____________________________________________   INITIAL IMPRESSION / ASSESSMENT AND PLAN / ED COURSE  Pertinent labs & imaging results that were  available during my care of the patient were reviewed by me and considered in my medical decision making (see chart for details).  Review of the [place] CSRS was performed in accordance of the [place] prior to dispensing any controlled drugs.    Assessment and plan Left knee pain Patient presents to the emergency department with left knee pain since a fall that occurred 3 to 4 days ago.  X-ray examination shows arthritic changes but no other acute abnormalities.  Patient was started on a short course of prednisone after patient failed conservative efforts with anti-inflammatories.  Patient education regarding possible intra-articular joint injections was given.  Patient was referred to orthopedics.  All patient questions were answered.   ____________________________________________  FINAL CLINICAL IMPRESSION(S) / ED DIAGNOSES  Final diagnoses:  Chronic pain of left knee      NEW MEDICATIONS STARTED DURING THIS VISIT:  ED Discharge Orders         Ordered    predniSONE (DELTASONE) 50 MG tablet     03/25/18 2258              This chart was dictated using voice recognition software/Dragon. Despite best efforts to proofread, errors can occur which can change the meaning. Any change was purely unintentional.    Karren Cobble 03/25/18 2303    Orbie Pyo, MD 03/25/18 [phone]

## 2018-04-05 ENCOUNTER — Other Ambulatory Visit: Payer: Self-pay | Admitting: Family Medicine

## 2018-04-08 NOTE — Telephone Encounter (Signed)
Please have patient make appt for f/u to discuss further need for these medications (Flexeril and Antivert) as it was recommended they be discontinued due to side effects. Pt has appt for 9/27. She will most likely need separate appt for this due to the number of problems already set to be discussed at her next appt.

## 2018-04-08 NOTE — Progress Notes (Signed)
Subjective:   [name]    DOB: 23-Jan-1956, 62 y.o. female   MRN: [id number]  [name] is a 62 y.o. female with a history of HTN, orthostatic hypotension, T2DM, chronic pain secondary to DJD of lumbar spine, and BPPV here for establishment with new provider at same practice.   Left knee pain:  Occurred after falling out of bed multiple times. Has been seen at ED on 8/22 and 9/5.  X-rays show degenerative changes in the left knee with medial compartment narrowing and small tricomparment osteophyte formation. No significant effusion or acute bony abnormalities.  Patient was referred to orthopedics. She plans to make an appointment.  Orthostatic hypotension follow-up: Currently not taking anything for blood pressure. Patient was taken off of Lisinopril in May 2019 due to low blood pressure with associated dizziness. BP today:  114/68 (BP at goal today) Home BP averages: unsure of exact numbers but have not been >130/90's.  Denies CP, SOB, dizziness, or changes in vision.  Diabetes mellitus type 2, uncomplicated Last G8J: 8.2 on 07/2017 Currently taking metformin 1000mg  BID. Last visit, addition of second medication was discussed but pt opted to attempt dietary improvements first.  Average home BS: ~170 in AM, ~200-250 in PM  Reports improvement in diet more days than not. Has decreased portion size. Cut down on potatoes. Weight: has lost 20 pounds 1-2 months  Mixed Alzheimers and Vascular dementia: Takes Cyclobenzaprine for chronic muslce spasms and Meclizine for chronic vertigo (nausea and dizziness) Was recommended by [place] clinic and Neurology doctor to discontinue due to sedating effects and increased risk of falls Patient notes taking these medications chronically and taking them daily for symptom relief   Health Maintenance: -Patient due for flu vaccine, HIV, diabetic eye exam, and mammogram Family history of breast cancer (daughter at 13 yo)  Review  of Systems:  Per HPI.   Patrick, medications and smoking status reviewed.  Objective:   BP 114/68   Pulse 99   Temp 98.2 F (36.8 C) (Oral)   Ht 5\' 5"  (1.651 m)   Wt 231 lb 3.2 oz (104.9 kg)   SpO2 94%   BMI 38.47 kg/m  Vitals and nursing note reviewed.  General: well nourished, well developed, in no acute distress with non-toxic appearance HEENT: normocephalic, atraumatic, moist mucous membranes Neck: supple CV: regular rate and rhythm without murmurs, rubs, or gallops, no lower extremity edema Lungs: clear to auscultation bilaterally with normal work of breathing Abdomen: soft, non-tender, non-distended, normoactive bowel sounds Skin: warm, dry, no rashes or lesions Extremities: warm and well perfused, normal tone MSK: Left knee wrapped in ACE bandaged, deferred exam at this time Neuro: Alert and oriented, speech normal  Assessment & Plan:   Morbid obesity ([place]) BMI = 38 with two co-morbid conditions DM and HBP  Diabetes mellitus type 2, uncomplicated  Worsening glycemic control. A1C increased from 8.2 (12/01/17) at last visit to 8.9 today. Even with reported dietary changes and weight loss.  Will add Januvia 100mg : 1 po QD  Continue Metformin 1000mg  BID  Discussed dietary changes and congratulated her on her changes. Emphasized keeping up the good work  POCT microalbumin ordered today  Pt plans to make appt for Diabetic Eye Exam soon.  Informed patient to have records sent over when she does. Pt understood.   RTC in 3 months for follow-up, repeat A1c   Orthostatic hypotension  Patient no longer experiencing symptoms of dizziness.  BP at goal today at 114/68.  Home BP's below goal. As such, will continue to hold Lisinopril  Encouraged to continue monitoring BP at home, and to call if persistently greater than 130/90s. Can resume anti-hypertensive at that time if necessary.  Chronic pain syndrome  Patient currently treating with Meloxicam, Gabapentin, and  Cyclobenzaprine  Patient recently diagnosed with mixed early onset Alzheimer's disease and vascular dementia  According to Beer's criteria, medication is contraindicated; also recommended by geriatrics clinic and neurology to discontinue due to sedating effects and increased risk of falls   Will switch from cyclobenzaprine to Baclofen 10mg : 1 po TID  RTC in 1 month for follow-up  Benign paroxysmal positional vertigo  Patient currently taking Meclizine and Ondansetron  Patient recently diagnosed with mixed early onset Alzheimer's disease and vascular dementia  According to Beer's criteria, medication is contraindicated; also recommended by geriatrics clinic and neurology to discontinue due to sedating effects and increased risk of falls  Will reduce Meclizine from TID to BID at this time  RTC in 1 month to follow-up to re-evaluate symptoms    Degenerative joint disease of knee, left  Worsening of left knee pain secondary to recent falls from bed   Some improvement in overall swelling and pain since 03/25/18 ER visit  Some relief with OTC ibuprofen    Recommended OTC tylenol for symptom relief as it is better for osteoarthritis   Patient to see ortho for further workup and treatment  Will continue to monitor   Healthcare maintenance  Flu shot given today  HIV screening ordered today  Mammogram referral sent  Orders Placed This Encounter  Procedures  . MM Digital Screening    INS; HUMANA PF: 06/04/10 @ [facility]/ NO NEEDS/  NO IMPLANT/ NOHX OF BR CA    Standing Status:   Future    Standing Expiration Date:   06/17/2019    Order Specific Question:   Reason for Exam (SYMPTOM  OR DIAGNOSIS REQUIRED)    Answer:   Screening, Family Cancer of breast cancer    Order Specific Question:   Preferred imaging location?    Answer:   [facility]  . Flu Vaccine QUAD 36+ mos IM  . HIV antibody (with reflex)  . HgB A1c  . POCT UA - Microalbumin   Meds ordered this encounter    Medications  . meclizine (ANTIVERT) 12.5 MG tablet    Sig: TAKE 1 TABLET 2 TIMES DAILY AS NEEDED FOR DIZZINESS.    Dispense:  180 tablet    Refill:  0  . baclofen (LIORESAL) 10 MG tablet    Sig: Take 1 tablet (10 mg total) by mouth 3 (three) times daily.    Dispense:  90 tablet    Refill:  0  . sitaGLIPtin (JANUVIA) 100 MG tablet    Sig: Take 1 tablet (100 mg total) by mouth daily.    Dispense:  90 tablet    Refill:  0   Mina Marble, DO PGY-1, [facility] 04/16/2018 6:11 PM

## 2018-04-15 ENCOUNTER — Telehealth: Payer: Self-pay

## 2018-04-15 NOTE — Telephone Encounter (Signed)
Called patient to set up appointment but there was no answer and no voice mail.  Ozella Almond, [place]

## 2018-04-16 ENCOUNTER — Other Ambulatory Visit: Payer: Self-pay

## 2018-04-16 ENCOUNTER — Encounter: Payer: Self-pay | Admitting: Family Medicine

## 2018-04-16 ENCOUNTER — Ambulatory Visit (INDEPENDENT_AMBULATORY_CARE_PROVIDER_SITE_OTHER): Payer: Medicare HMO | Admitting: Family Medicine

## 2018-04-16 VITALS — BP 114/68 | HR 99 | Temp 98.2°F | Ht 65.0 in | Wt 231.2 lb

## 2018-04-16 DIAGNOSIS — E119 Type 2 diabetes mellitus without complications: Secondary | ICD-10-CM | POA: Diagnosis not present

## 2018-04-16 DIAGNOSIS — Z0000 Encounter for general adult medical examination without abnormal findings: Secondary | ICD-10-CM | POA: Insufficient documentation

## 2018-04-16 DIAGNOSIS — I951 Orthostatic hypotension: Secondary | ICD-10-CM

## 2018-04-16 DIAGNOSIS — H8110 Benign paroxysmal vertigo, unspecified ear: Secondary | ICD-10-CM | POA: Diagnosis not present

## 2018-04-16 DIAGNOSIS — M1712 Unilateral primary osteoarthritis, left knee: Secondary | ICD-10-CM

## 2018-04-16 DIAGNOSIS — Z23 Encounter for immunization: Secondary | ICD-10-CM

## 2018-04-16 DIAGNOSIS — G894 Chronic pain syndrome: Secondary | ICD-10-CM | POA: Diagnosis not present

## 2018-04-16 LAB — POCT UA - MICROALBUMIN
Albumin/Creatinine Ratio, Urine, POC: <30
Creatinine, POC: 200 mg/dL
Microalbumin Ur, POC: 80 mg/L

## 2018-04-16 LAB — POCT GLYCOSYLATED HEMOGLOBIN (HGB A1C): HBA1C, POC (CONTROLLED DIABETIC RANGE): 8.9 % — AB (ref 0.0–7.0)

## 2018-04-16 MED ORDER — MECLIZINE HCL 12.5 MG PO TABS: ORAL_TABLET | ORAL | 0 refills | Status: DC

## 2018-04-16 MED ORDER — SITAGLIPTIN PHOSPHATE 100 MG PO TABS: 100.0000 mg | ORAL_TABLET | Freq: Every day | ORAL | 0 refills | Status: DC

## 2018-04-16 MED ORDER — BACLOFEN 10 MG PO TABS: 10.0000 mg | ORAL_TABLET | Freq: Three times a day (TID) | ORAL | 0 refills | Status: AC

## 2018-04-16 NOTE — Assessment & Plan Note (Signed)
   Worsening of left knee pain secondary to recent falls from bed   Some improvement in overall swelling and pain since 03/25/18 ER visit  Some relief with OTC ibuprofen    Recommended OTC tylenol for symptom relief as it is better for osteoarthritis   Patient to see ortho for further workup and treatment  Will continue to monitor

## 2018-04-16 NOTE — Assessment & Plan Note (Addendum)
   Patient no longer experiencing symptoms of dizziness.  BP at goal today at 114/68. Home BP's below goal. As such, will continue to hold Lisinopril  Encouraged to continue monitoring BP at home, and to call if persistently greater than 130/90s. Can resume anti-hypertensive at that time if necessary.

## 2018-04-16 NOTE — Assessment & Plan Note (Addendum)
   Patient currently treating with Meloxicam, Gabapentin, and Cyclobenzaprine  Patient recently diagnosed with mixed early onset Alzheimer's disease and vascular dementia  According to Beer's criteria, medication is contraindicated; also recommended by geriatrics clinic and neurology to discontinue due to sedating effects and increased risk of falls   Will switch from cyclobenzaprine to Baclofen 10mg : 1 po TID  RTC in 1 month for follow-up

## 2018-04-16 NOTE — Assessment & Plan Note (Addendum)
   Flu shot given today  HIV screening ordered today  Mammogram referral sent

## 2018-04-16 NOTE — Assessment & Plan Note (Signed)
BMI = 38 with two co-morbid conditions DM and HBP

## 2018-04-16 NOTE — Patient Instructions (Signed)
Thank you for coming to see me today. It was a pleasure. Today we talked about:   1. Diabetes: Please continue your metformin as prescribed. Please start taking Januvia 100mg : 1 pill a day. Please continue to check your blood sugar several times a day and keep a log. Return for a follow-up in 3 months. Also, please make your diabetic eye appointment soon and have your records sent to me.  2. Blood Pressure: You blood pressure was normal today. We will not make any changes at this time.  3. Chronic Pain: We have switched you from Cyclobenzaprine to Baclofen. Please take 1 tablet three times a day as needed for muscle spasms. Please return in 1 month for follow-up.  4. Vertigo/Nas[place]a: We have reduced your Meclizine from three times a day to two times a day. We will follow-up in 1 month to see how you are tolerating this.   5. Health Maintenance: Today you got your flu shot and HIV screen. We also sent in for a mammogram. Please expect a call to schedule an appointment within 2-3 weeks. Please let me know if you don't get a phone call.  Please follow-up with me (Dr. Tarry Kos) in 1 month.  If you have any questions or concerns, please do not hesitate to call the office at [phone].  Take Care,   Dr. Mina Marble, DO Resident Physician [place] [phone]

## 2018-04-16 NOTE — Assessment & Plan Note (Addendum)
   Worsening glycemic control. A1C increased from 8.2 (12/01/17) at last visit to 8.9 today. Even with reported dietary changes and weight loss.  Will add Januvia 100mg : 1 po QD  Continue Metformin 1000mg  BID  Discussed dietary changes and congratulated her on her changes. Emphasized keeping up the good work  POCT microalbumin ordered today  Pt plans to make appt for Diabetic Eye Exam soon.  Informed patient to have records sent over when she does. Pt understood.   RTC in 3 months for follow-up, repeat A1c

## 2018-04-16 NOTE — Assessment & Plan Note (Addendum)
   Patient currently taking Meclizine and Ondansetron  Patient recently diagnosed with mixed early onset Alzheimer's disease and vascular dementia  According to Beer's criteria, medication is contraindicated; also recommended by geriatrics clinic and neurology to discontinue due to sedating effects and increased risk of falls  Will reduce Meclizine from TID to BID at this time  RTC in 1 month to follow-up to re-evaluate symptoms

## 2018-04-17 LAB — HIV ANTIBODY (ROUTINE TESTING W REFLEX): HIV SCREEN 4TH GENERATION: NONREACTIVE

## 2018-05-14 ENCOUNTER — Other Ambulatory Visit: Payer: Self-pay | Admitting: Family Medicine

## 2018-05-14 DIAGNOSIS — G894 Chronic pain syndrome: Secondary | ICD-10-CM

## 2018-05-15 NOTE — Progress Notes (Signed)
Subjective:   [name]    DOB: 10/07/1955, 62 y.o. female   MRN: [id number]  [name] is a 62 y.o. female here for follow-up, labs and med check.  No concerns for today.  Diabetes mellitus type 2, uncomplicated Q5Z increased from 8.2 (12/01/17) at last visit to 8.9 today. Even with reported dietary changes and weight loss. Januvia 100mg  added at last visit in addition to Metformin 1000mg  BID. Patient reports BS at home: 140-160's in the morning, 200's near a meal. Patient has lost 4 more pounds (20 pounds in 3 months). Micoralbumin to Cr ratio <30.  Denies episodes of low blood sugar. Has not been able to schedule diabetic eye exam. Plans to schedule after her knee appointment on 05/24/18.   Chronic pain syndrome Patient was treating chronically with Meloxicam, Gabapentin, and Cyclobenzaprine. Due to recent dx of Alzheimer's dz and vascular dementia Cyclobenzaprine was switched to Baclofen 10mg  TID.  Patient's notes still getting some muscle cramps but "luckily they ease up pretty quickly".   Benign paroxysmal positional vertigo This is a chronic condition for patient. She has been taking Meclizine and Ondasetron. Due to her recent dx of Alzheimer's and vascular dementia, it was recommended to discontinue sedating medications. Reduced Meclizine from TID to BID at last visit. Patient notes new BID dosaging seem to be doing good.  Onychomycosis of L>R 2nd toenail: Endorses thickened yellow toenails on her 2nd toe. Sometimes entire toe becomes red and irritated on the left when she wears shoes.   Health Maintenance: Patient was referred for mammogram. Patient notes she has not scheduled the appointment, but plans to call to get an appointment soon.  Review of Systems:  Per HPI.   [place], medications and smoking status reviewed.  Objective:   BP 122/72   Pulse 97   Temp 98 F (36.7 C) (Oral)   Ht 5\' 5"  (1.651 m)   Wt 227 lb (103 kg)   SpO2 98%   BMI 37.77 kg/m   Vitals and nursing note reviewed.  General: well nourished, well developed, in no acute distress with non-toxic appearance HEENT: normocephalic, atraumatic, moist mucous membranes Neck: supple CV: regular rate and rhythm without murmurs, rubs, or gallops, trace lower extremity edema Lungs: clear to auscultation bilaterally with normal work of breathing Abdomen: soft, non-tender, non-distended, normoactive bowel sounds Skin: warm, dry, no rashes or lesions. Thickened toe yellow nails on L>R second toenail Extremities: warm and well perfused, normal tone  Assessment & Plan:   Diabetes mellitus type 2, uncomplicated  D6L has increased since May (8.2 to 8.9)  Added Januvia 100mg  at last visit. Patient denies any adverse effects and is tolerating well  Patient continues to lose weight, which is fantastic.   Kidney function appears to be good at this time  Will continue current treatment with Metformin 1000mg  BID and Januvia 100mg  QD   Follow-up in 2 months for recheck of A1C and kidney function   Patient to follow-up with appointment date for diabetic eye exam when set  Chronic pain syndrome  Appears to be tolerating Baclofen well but not well managed at current dose  Will increase Baclofen to 20mg  TID as this will be easier for patient then having her take 1.5 pills (15mg ).  Will assess improvement in 2 months at follow-up appointment  Benign paroxysmal positional vertigo  Patient tolerating dose adjustment of Meclizine from TID to BID well.   Will continue current dose and consider further adjustments at follow-up  visit in 2 months  Onychomycosis of toenail  Thickened yellow 2nd toenail, bilaterally, L>R  Left becomes irritated at times with shoes causing redness of entire toe  Discussed treatment options including: no treatment if asympromatic vs OTC antifungals vs prescription oral medications vs referral to dermatology clinic for toe nail removal if  symptomatic.  Patient opted to wait at this time and will discuss further if becomes more bothersome  Meds ordered this encounter  Medications  . baclofen (LIORESAL) 20 MG tablet    Sig: Take 1 tablet (20 mg total) by mouth 3 (three) times daily.    Dispense:  270 tablet    Refill:  0   Mina Marble, DO PGY-1, [facility] 05/19/2018 8:25 PM

## 2018-05-19 ENCOUNTER — Other Ambulatory Visit: Payer: Self-pay

## 2018-05-19 ENCOUNTER — Ambulatory Visit (INDEPENDENT_AMBULATORY_CARE_PROVIDER_SITE_OTHER): Payer: Medicare HMO | Admitting: Family Medicine

## 2018-05-19 ENCOUNTER — Encounter: Payer: Self-pay | Admitting: Family Medicine

## 2018-05-19 VITALS — BP 122/72 | HR 97 | Temp 98.0°F | Ht 65.0 in | Wt 227.0 lb

## 2018-05-19 DIAGNOSIS — H8110 Benign paroxysmal vertigo, unspecified ear: Secondary | ICD-10-CM | POA: Diagnosis not present

## 2018-05-19 DIAGNOSIS — E119 Type 2 diabetes mellitus without complications: Secondary | ICD-10-CM

## 2018-05-19 DIAGNOSIS — G894 Chronic pain syndrome: Secondary | ICD-10-CM | POA: Diagnosis not present

## 2018-05-19 DIAGNOSIS — B351 Tinea unguium: Secondary | ICD-10-CM | POA: Diagnosis not present

## 2018-05-19 MED ORDER — BACLOFEN 20 MG PO TABS: 20.0000 mg | ORAL_TABLET | Freq: Three times a day (TID) | ORAL | 0 refills | Status: AC

## 2018-05-19 NOTE — Assessment & Plan Note (Signed)
   Thickened yellow 2nd toenail, bilaterally, L>R  Left becomes irritated at times with shoes causing redness of entire toe  Discussed treatment options including: no treatment if asympromatic vs OTC antifungals vs prescription oral medications vs referral to dermatology clinic for toe nail removal if symptomatic.  Patient opted to wait at this time and will discuss further if becomes more bothersome

## 2018-05-19 NOTE — Assessment & Plan Note (Signed)
   Patient tolerating dose adjustment of Meclizine from TID to BID well.   Will continue current dose and consider further adjustments at follow-up visit in 2 months

## 2018-05-19 NOTE — Assessment & Plan Note (Addendum)
   Appears to be tolerating Baclofen well but not well managed at current dose  Will increase Baclofen to 20mg  TID as this will be easier for patient then having her take 1.5 pills (15mg ).  Will assess improvement in 2 months at follow-up appointment

## 2018-05-19 NOTE — Patient Instructions (Signed)
Thank you for coming to see me today. It was a pleasure.   Today we talked about:   Diabetes: We will continue your current medications and have you come back in 2 months to recheck you hemoglobin A1C and kidneys.  Muscle Cramps: We have increased your baclofen from 10mg  to 20mg . This prescription was sent to [facility].  Please schedule your diabetic eye exam and mammogram and call to let me know what dates those appointments are.  Please follow-up with me in 2 months.  If you have any questions or concerns, please do not hesitate to call the office at [phone].  Take Care,   Dr. Mina Marble, DO Resident Physician [place] [phone]

## 2018-05-19 NOTE — Assessment & Plan Note (Signed)
   A1C has increased since May (8.2 to 8.9)  Added Januvia 100mg  at last visit. Patient denies any adverse effects and is tolerating well  Patient continues to lose weight, which is fantastic.   Kidney function appears to be good at this time  Will continue current treatment with Metformin 1000mg  BID and Januvia 100mg  QD   Follow-up in 2 months for recheck of A1C and kidney function   Patient to follow-up with appointment date for diabetic eye exam when set

## 2018-06-18 ENCOUNTER — Other Ambulatory Visit: Payer: Self-pay | Admitting: Family Medicine

## 2018-06-18 DIAGNOSIS — E119 Type 2 diabetes mellitus without complications: Secondary | ICD-10-CM

## 2018-06-23 NOTE — Telephone Encounter (Signed)
Patient has appointment on 12/16. Could you call patient and determine if she needs this refilled prior to her appointment? I would like to recheck her A1C to determine if we need to make any changes to her medications. I would hate for her to pay for a new prescription if we end up changing to something different.

## 2018-06-23 NOTE — Telephone Encounter (Signed)
Attempted to call patient to see if she needed her RX for Januvia filled prior to her appointment on 07/05/2018.  No answer and no voicemail was available.  Will try again later.  [name], [place]

## 2018-06-24 NOTE — Telephone Encounter (Signed)
Called and left voicemail for patient. Patient is instructed to call back if she wants Januvia filled prior to appointment since there could be changes.  If patient calls please ask her if she needs a RX.  Marland KitchenOzella Almond, CMA

## 2018-06-24 NOTE — Telephone Encounter (Signed)
Attempted calling patient again and still no answer or voice mail.  [name], [place]

## 2018-07-05 ENCOUNTER — Ambulatory Visit: Payer: Medicare HMO | Admitting: Family Medicine

## 2018-07-13 NOTE — Telephone Encounter (Signed)
Spoke to patient concerning Januvia rx and recent missed appointment scheduled on 12/16. Patient notes being under the weather and plans to reschedule appointment after the New Year. Will refill Januvia at this time. She notes it is making her urinate more often but she is tolerating this well and is happy with the new treatment management.   Additionally, was confused with the recent change in her Baclofen prescription. Informed her that she should take the Baclofen TID. She endorsed recent muscle cramps and expects improvement with this change. Will follow-up with patient at her follow-up visit after the New Year.

## 2018-08-16 ENCOUNTER — Other Ambulatory Visit: Payer: Self-pay | Admitting: Orthopedic Surgery

## 2018-08-16 DIAGNOSIS — M1712 Unilateral primary osteoarthritis, left knee: Secondary | ICD-10-CM

## 2018-08-30 ENCOUNTER — Ambulatory Visit
Admission: RE | Admit: 2018-08-30 | Discharge: 2018-08-30 | Disposition: A | Discharge status: Home / Self Care | Payer: Medicare HMO | Source: Ambulatory Visit | Attending: Orthopedic Surgery | Admitting: Orthopedic Surgery

## 2018-08-30 DIAGNOSIS — M1712 Unilateral primary osteoarthritis, left knee: Secondary | ICD-10-CM | POA: Diagnosis not present

## 2018-09-08 ENCOUNTER — Encounter
Admission: RE | Admit: 2018-09-08 | Discharge: 2018-09-08 | Disposition: A | Discharge status: Home / Self Care | Payer: Medicare HMO | Source: Ambulatory Visit | Attending: Orthopedic Surgery | Admitting: Orthopedic Surgery

## 2018-09-08 ENCOUNTER — Other Ambulatory Visit: Payer: Self-pay

## 2018-09-08 DIAGNOSIS — Z01818 Encounter for other preprocedural examination: Secondary | ICD-10-CM | POA: Diagnosis not present

## 2018-09-08 DIAGNOSIS — I10 Essential (primary) hypertension: Secondary | ICD-10-CM | POA: Insufficient documentation

## 2018-09-08 DIAGNOSIS — E118 Type 2 diabetes mellitus with unspecified complications: Secondary | ICD-10-CM | POA: Diagnosis not present

## 2018-09-08 HISTORY — DX: Personal history of urinary calculi: Z87.442

## 2018-09-08 HISTORY — DX: Chronic obstructive pulmonary disease, unspecified: J44.9

## 2018-09-08 LAB — URINALYSIS, ROUTINE W REFLEX MICROSCOPIC
Bilirubin Urine: NEGATIVE
GLUCOSE, UA: NEGATIVE mg/dL
Hgb urine dipstick: NEGATIVE
Ketones, ur: NEGATIVE mg/dL
Leukocytes,Ua: NEGATIVE
NITRITE: POSITIVE — AB
Protein, ur: NEGATIVE mg/dL
Specific Gravity, Urine: 1.017 (ref 1.005–1.030)
pH: 5 (ref 5.0–8.0)

## 2018-09-08 LAB — TYPE AND SCREEN
ABO/RH(D): O POS
Antibody Screen: NEGATIVE

## 2018-09-08 LAB — BASIC METABOLIC PANEL
Anion gap: 7 (ref 5–15)
BUN: 14 mg/dL (ref 8–23)
CO2: 25 mmol/L (ref 22–32)
Calcium: 9.1 mg/dL (ref 8.9–10.3)
Chloride: 106 mmol/L (ref 98–111)
Creatinine, Ser: 0.5 mg/dL (ref 0.44–1.00)
GFR calc Af Amer: >60 mL/min (ref >60)
GFR calc non Af Amer: >60 mL/min (ref >60)
Glucose, Bld: 147 mg/dL — ABNORMAL HIGH (ref 70–99)
Potassium: 3.5 mmol/L (ref 3.5–5.1)
Sodium: 138 mmol/L (ref 135–145)

## 2018-09-08 LAB — APTT: aPTT: 36 seconds (ref 24–36)

## 2018-09-08 LAB — CBC
HCT: 35.8 % — ABNORMAL LOW (ref 36.0–46.0)
Hemoglobin: 11.6 g/dL — ABNORMAL LOW (ref 12.0–15.0)
MCH: 31.3 pg (ref 26.0–34.0)
MCHC: 32.4 g/dL (ref 30.0–36.0)
MCV: 96.5 fL (ref 80.0–100.0)
PLATELETS: 147 10*3/uL — AB (ref 150–400)
RBC: 3.71 MIL/uL — ABNORMAL LOW (ref 3.87–5.11)
RDW: 15.8 % — AB (ref 11.5–15.5)
WBC: 7.4 10*3/uL (ref 4.0–10.5)
nRBC: 0 % (ref 0.0–0.2)

## 2018-09-08 LAB — PROTIME-INR
INR: 1.17
Prothrombin Time: 14.8 seconds (ref 11.4–15.2)

## 2018-09-08 LAB — SURGICAL PCR SCREEN
MRSA, PCR: NEGATIVE
Staphylococcus aureus: POSITIVE — AB

## 2018-09-08 LAB — SEDIMENTATION RATE: SED RATE: 38 mm/h — AB (ref 0–30)

## 2018-09-08 NOTE — Patient Instructions (Signed)
Your procedure is scheduled on: Tuesday 09/21/2018 Report to [place]. To find out your arrival time please call [phone] between 1PM - 3PM on Monday 09/20/2018.  Remember: Instructions that are not followed completely may result in serious medical risk, up to and including death, or upon the discretion of your surgeon and anesthesiologist your surgery may need to be rescheduled.     _X__ 1. Do not eat food after midnight the night before your procedure.                 No gum chewing or hard candies. You may drink clear liquids up to 2 hours                 before you are scheduled to arrive for your surgery- DO not drink clear                 liquids within 2 hours of the start of your surgery.                 Clear Liquids include:  water, apple juice without pulp, clear carbohydrate                 drink such as Clearfast or Gatorade, Black Coffee or Tea (Do not add                 anything to coffee or tea).  __X__2.  On the morning of surgery brush your teeth with toothpaste and water, you                 may rinse your mouth with mouthwash if you wish.  Do not swallow any              toothpaste of mouthwash.     _X__ 3.  No Alcohol for 24 hours before or after surgery.   _X__ 4.  Do Not Smoke or use e-cigarettes For 24 Hours Prior to Your Surgery.                 Do not use any chewable tobacco products for at least 6 hours prior to                 surgery.  ____  5.  Bring all medications with you on the day of surgery if instructed.   __X__  6.  Notify your doctor if there is any change in your medical condition      (cold, fever, infections).     Do not wear jewelry, make-up, hairpins, clips or nail polish. Do not wear lotions, powders, or perfumes.  Do not shave 48 hours prior to surgery. Men may shave face and neck. Do not bring valuables to the hospital.    [facility] is not responsible for any belongings or  valuables.  Contacts, dentures/partials or body piercings may not be worn into surgery. Bring a case for your contacts, glasses or hearing aids, a denture cup will be supplied. Leave your suitcase in the car. After surgery it may be brought to your room. For patients admitted to the hospital, discharge time is determined by your treatment team.   Patients discharged the day of surgery will not be allowed to drive home.   Please read over the following fact sheets that you were given:   MRSA Information  __X__ Take these medicines the morning of surgery with A SIP OF WATER:  1. atorvastatin (LIPITOR)  2. baclofen (LIORESAL)   3. gabapentin (NEURONTIN)   4. omeprazole (PRILOSEC)  5.  6.  ____ Fleet Enema (as directed)   __X__ Use CHG Soap/SAGE wipes as directed  __X__ Use inhalers on the day of surgery (NEBULIZER TREATMENT AT HOME BEFORE ARRIVING TO SURGERY)  __X__ Stop metformin/Janumet/Farxiga 2 days prior to surgery    ____ Take 1/2 of usual insulin dose the night before surgery. No insulin the morning          of surgery.   ____ Stop Blood Thinners Coumadin/Plavix/Xarelto/Pleta/Pradaxa/Eliquis/Effient/Aspirin  on   Or contact your Surgeon, Cardiologist or Medical Doctor regarding  ability to stop your blood thinners  __X__ Stop Anti-inflammatories 7 days before surgery such as Advil, Ibuprofen, Motrin,  BC or Goodies Powder, Naprosyn, Naproxen, Aleve, Aspirin and Meloxicam    __X__ Stop all herbal supplements, fish oil or vitamin E until after surgery.  Multivitamin OK to continue  ____ Bring C-Pap to the hospital.

## 2018-09-10 LAB — URINE CULTURE: Culture: >=100000 — AB

## 2018-09-10 NOTE — Pre-Procedure Instructions (Signed)
Elevated sed rate and positive staph aureus results sent to Dr. Rudene Christians for review.  Asked if wanted any other antibiotic for positive staph aureus?

## 2018-09-10 NOTE — Pre-Procedure Instructions (Signed)
Faxed abnormal UC results to Dr. Rudene Christians. Fax confirmation received.

## 2018-09-13 ENCOUNTER — Other Ambulatory Visit: Payer: Self-pay

## 2018-09-13 DIAGNOSIS — F39 Unspecified mood [affective] disorder: Secondary | ICD-10-CM

## 2018-09-13 DIAGNOSIS — G894 Chronic pain syndrome: Secondary | ICD-10-CM

## 2018-09-15 MED ORDER — DULOXETINE HCL 60 MG PO CPEP: 60.0000 mg | ORAL_CAPSULE | Freq: Every day | ORAL | 3 refills | Status: DC

## 2018-09-20 MED ORDER — CEFAZOLIN SODIUM-DEXTROSE 2-4 GM/100ML-% IV SOLN
2.0000 g | Freq: Once | INTRAVENOUS | Status: AC
  Administered 2018-09-21: 2 g via INTRAVENOUS

## 2018-09-20 MED ORDER — TRANEXAMIC ACID-NACL 1000-0.7 MG/100ML-% IV SOLN
1000.0000 mg | INTRAVENOUS | Status: AC
  Administered 2018-09-21: 1000 mg via INTRAVENOUS
  Filled 2018-09-20: qty 100

## 2018-09-21 ENCOUNTER — Encounter
Admission: RE | Disposition: A | Discharge status: Home Health | Payer: Self-pay | Source: Home / Self Care | Attending: Orthopedic Surgery

## 2018-09-21 ENCOUNTER — Inpatient Hospital Stay: Payer: Medicare Other | Admitting: Anesthesiology

## 2018-09-21 ENCOUNTER — Encounter: Payer: Self-pay | Admitting: Certified Registered Nurse Anesthetist

## 2018-09-21 ENCOUNTER — Inpatient Hospital Stay
Admission: RE | Admit: 2018-09-21 | Discharge: 2018-09-25 | DRG: 469 | Disposition: A | Discharge status: Home Health | Payer: Medicare Other | Attending: Orthopedic Surgery | Admitting: Orthopedic Surgery

## 2018-09-21 ENCOUNTER — Other Ambulatory Visit: Payer: Self-pay

## 2018-09-21 ENCOUNTER — Inpatient Hospital Stay: Payer: Medicare Other

## 2018-09-21 DIAGNOSIS — Z803 Family history of malignant neoplasm of breast: Secondary | ICD-10-CM

## 2018-09-21 DIAGNOSIS — I69354 Hemiplegia and hemiparesis following cerebral infarction affecting left non-dominant side: Secondary | ICD-10-CM | POA: Diagnosis not present

## 2018-09-21 DIAGNOSIS — E1169 Type 2 diabetes mellitus with other specified complication: Secondary | ICD-10-CM | POA: Diagnosis present

## 2018-09-21 DIAGNOSIS — I10 Essential (primary) hypertension: Secondary | ICD-10-CM | POA: Diagnosis present

## 2018-09-21 DIAGNOSIS — Z87442 Personal history of urinary calculi: Secondary | ICD-10-CM

## 2018-09-21 DIAGNOSIS — M722 Plantar fascial fibromatosis: Secondary | ICD-10-CM | POA: Diagnosis present

## 2018-09-21 DIAGNOSIS — Z961 Presence of intraocular lens: Secondary | ICD-10-CM | POA: Diagnosis present

## 2018-09-21 DIAGNOSIS — Z7982 Long term (current) use of aspirin: Secondary | ICD-10-CM | POA: Diagnosis not present

## 2018-09-21 DIAGNOSIS — G8918 Other acute postprocedural pain: Secondary | ICD-10-CM

## 2018-09-21 DIAGNOSIS — F0280 Dementia in other diseases classified elsewhere without behavioral disturbance: Secondary | ICD-10-CM | POA: Diagnosis present

## 2018-09-21 DIAGNOSIS — Z6837 Body mass index (BMI) 37.0-37.9, adult: Secondary | ICD-10-CM

## 2018-09-21 DIAGNOSIS — Z7984 Long term (current) use of oral hypoglycemic drugs: Secondary | ICD-10-CM | POA: Diagnosis not present

## 2018-09-21 DIAGNOSIS — M1712 Unilateral primary osteoarthritis, left knee: Principal | ICD-10-CM | POA: Diagnosis present

## 2018-09-21 DIAGNOSIS — J449 Chronic obstructive pulmonary disease, unspecified: Secondary | ICD-10-CM | POA: Diagnosis present

## 2018-09-21 DIAGNOSIS — F0150 Vascular dementia without behavioral disturbance: Secondary | ICD-10-CM | POA: Diagnosis present

## 2018-09-21 DIAGNOSIS — K219 Gastro-esophageal reflux disease without esophagitis: Secondary | ICD-10-CM | POA: Diagnosis present

## 2018-09-21 DIAGNOSIS — Z7983 Long term (current) use of bisphosphonates: Secondary | ICD-10-CM | POA: Diagnosis not present

## 2018-09-21 DIAGNOSIS — G92 Toxic encephalopathy: Secondary | ICD-10-CM | POA: Diagnosis not present

## 2018-09-21 DIAGNOSIS — G309 Alzheimer's disease, unspecified: Secondary | ICD-10-CM | POA: Diagnosis present

## 2018-09-21 DIAGNOSIS — M810 Age-related osteoporosis without current pathological fracture: Secondary | ICD-10-CM | POA: Diagnosis present

## 2018-09-21 DIAGNOSIS — G894 Chronic pain syndrome: Secondary | ICD-10-CM | POA: Diagnosis present

## 2018-09-21 DIAGNOSIS — Z9841 Cataract extraction status, right eye: Secondary | ICD-10-CM

## 2018-09-21 DIAGNOSIS — Z811 Family history of alcohol abuse and dependence: Secondary | ICD-10-CM

## 2018-09-21 DIAGNOSIS — M25562 Pain in left knee: Secondary | ICD-10-CM | POA: Diagnosis present

## 2018-09-21 DIAGNOSIS — Z791 Long term (current) use of non-steroidal anti-inflammatories (NSAID): Secondary | ICD-10-CM

## 2018-09-21 DIAGNOSIS — Z9842 Cataract extraction status, left eye: Secondary | ICD-10-CM

## 2018-09-21 DIAGNOSIS — E559 Vitamin D deficiency, unspecified: Secondary | ICD-10-CM | POA: Diagnosis present

## 2018-09-21 DIAGNOSIS — Z833 Family history of diabetes mellitus: Secondary | ICD-10-CM

## 2018-09-21 DIAGNOSIS — Z885 Allergy status to narcotic agent status: Secondary | ICD-10-CM | POA: Diagnosis not present

## 2018-09-21 DIAGNOSIS — Z96652 Presence of left artificial knee joint: Secondary | ICD-10-CM

## 2018-09-21 DIAGNOSIS — Z79899 Other long term (current) drug therapy: Secondary | ICD-10-CM

## 2018-09-21 DIAGNOSIS — E7849 Other hyperlipidemia: Secondary | ICD-10-CM | POA: Diagnosis present

## 2018-09-21 DIAGNOSIS — H8110 Benign paroxysmal vertigo, unspecified ear: Secondary | ICD-10-CM | POA: Diagnosis present

## 2018-09-21 DIAGNOSIS — Z8249 Family history of ischemic heart disease and other diseases of the circulatory system: Secondary | ICD-10-CM

## 2018-09-21 HISTORY — PX: TOTAL KNEE ARTHROPLASTY: SHX125

## 2018-09-21 LAB — CBC
HCT: 34.3 % — ABNORMAL LOW (ref 36.0–46.0)
Hemoglobin: 11.4 g/dL — ABNORMAL LOW (ref 12.0–15.0)
MCH: 31.8 pg (ref 26.0–34.0)
MCHC: 33.2 g/dL (ref 30.0–36.0)
MCV: 95.5 fL (ref 80.0–100.0)
Platelets: 125 10*3/uL — ABNORMAL LOW (ref 150–400)
RBC: 3.59 MIL/uL — ABNORMAL LOW (ref 3.87–5.11)
RDW: 15.9 % — ABNORMAL HIGH (ref 11.5–15.5)
WBC: 8.2 10*3/uL (ref 4.0–10.5)
nRBC: 0 % (ref 0.0–0.2)

## 2018-09-21 LAB — CREATININE, SERUM
Creatinine, Ser: 0.52 mg/dL (ref 0.44–1.00)
GFR calc Af Amer: >60 mL/min (ref >60)
GFR calc non Af Amer: >60 mL/min (ref >60)

## 2018-09-21 LAB — GLUCOSE, CAPILLARY
GLUCOSE-CAPILLARY: 136 mg/dL — AB (ref 70–99)
Glucose-Capillary: 129 mg/dL — ABNORMAL HIGH (ref 70–99)
Glucose-Capillary: 300 mg/dL — ABNORMAL HIGH (ref 70–99)
Glucose-Capillary: 278 mg/dL — ABNORMAL HIGH (ref 70–99)

## 2018-09-21 LAB — ABO/RH: ABO/RH(D): O POS

## 2018-09-21 SURGERY — ARTHROPLASTY, KNEE, TOTAL
Anesthesia: Spinal | Site: Knee | Laterality: Left

## 2018-09-21 MED ORDER — DOCUSATE SODIUM 100 MG PO CAPS
100.0000 mg | ORAL_CAPSULE | Freq: Two times a day (BID) | ORAL | Status: DC
  Administered 2018-09-22 – 2018-09-25 (×4): 100 mg via ORAL
  Filled 2018-09-21 (×6): qty 1

## 2018-09-21 MED ORDER — LINAGLIPTIN 5 MG PO TABS
5.0000 mg | ORAL_TABLET | Freq: Every day | ORAL | Status: DC
  Administered 2018-09-22 – 2018-09-25 (×4): 5 mg via ORAL
  Filled 2018-09-21 (×5): qty 1

## 2018-09-21 MED ORDER — ONDANSETRON HCL 4 MG/2ML IJ SOLN
INTRAMUSCULAR | Status: AC
  Filled 2018-09-21: qty 2

## 2018-09-21 MED ORDER — MAGNESIUM CITRATE PO SOLN
1.0000 | Freq: Once | ORAL | Status: DC | PRN
  Filled 2018-09-21: qty 296

## 2018-09-21 MED ORDER — BACLOFEN 10 MG PO TABS
20.0000 mg | ORAL_TABLET | Freq: Three times a day (TID) | ORAL | Status: DC
  Administered 2018-09-22 – 2018-09-23 (×6): 20 mg via ORAL
  Filled 2018-09-21 (×11): qty 2

## 2018-09-21 MED ORDER — ONDANSETRON HCL 4 MG PO TABS: 4.0000 mg | ORAL_TABLET | Freq: Four times a day (QID) | ORAL | Status: DC | PRN

## 2018-09-21 MED ORDER — ASPIRIN EC 81 MG PO TBEC
81.0000 mg | DELAYED_RELEASE_TABLET | Freq: Every day | ORAL | Status: DC
  Administered 2018-09-22 – 2018-09-25 (×4): 81 mg via ORAL
  Filled 2018-09-21 (×4): qty 1

## 2018-09-21 MED ORDER — BUPIVACAINE HCL (PF) 0.5 % IJ SOLN
INTRAMUSCULAR | Status: AC
  Filled 2018-09-21: qty 10

## 2018-09-21 MED ORDER — DEXAMETHASONE SODIUM PHOSPHATE 10 MG/ML IJ SOLN
INTRAMUSCULAR | Status: AC
  Filled 2018-09-21: qty 1

## 2018-09-21 MED ORDER — PHENOL 1.4 % MT LIQD
1.0000 | OROMUCOSAL | Status: DC | PRN
  Filled 2018-09-21: qty 177

## 2018-09-21 MED ORDER — MIDAZOLAM HCL 2 MG/2ML IJ SOLN
INTRAMUSCULAR | Status: AC
  Filled 2018-09-21: qty 2

## 2018-09-21 MED ORDER — BISACODYL 5 MG PO TBEC: 5.0000 mg | DELAYED_RELEASE_TABLET | Freq: Every day | ORAL | Status: DC | PRN

## 2018-09-21 MED ORDER — PANTOPRAZOLE SODIUM 40 MG PO TBEC
40.0000 mg | DELAYED_RELEASE_TABLET | Freq: Every day | ORAL | Status: DC
  Administered 2018-09-22 – 2018-09-25 (×4): 40 mg via ORAL
  Filled 2018-09-21 (×4): qty 1

## 2018-09-21 MED ORDER — CEFAZOLIN SODIUM-DEXTROSE 2-4 GM/100ML-% IV SOLN
2.0000 g | Freq: Four times a day (QID) | INTRAVENOUS | Status: AC
  Administered 2018-09-21 (×2): 2 g via INTRAVENOUS
  Filled 2018-09-21 (×2): qty 100

## 2018-09-21 MED ORDER — KETOROLAC TROMETHAMINE 30 MG/ML IJ SOLN
INTRAMUSCULAR | Status: AC
  Filled 2018-09-21: qty 1

## 2018-09-21 MED ORDER — CEFAZOLIN SODIUM-DEXTROSE 2-4 GM/100ML-% IV SOLN
INTRAVENOUS | Status: AC
  Filled 2018-09-21: qty 100

## 2018-09-21 MED ORDER — GABAPENTIN 400 MG PO CAPS
800.0000 mg | ORAL_CAPSULE | Freq: Three times a day (TID) | ORAL | Status: DC
  Administered 2018-09-22 – 2018-09-23 (×6): 800 mg via ORAL
  Filled 2018-09-21 (×7): qty 2

## 2018-09-21 MED ORDER — KETAMINE HCL 50 MG/ML IJ SOLN
INTRAMUSCULAR | Status: DC | PRN
  Administered 2018-09-21: 50 mg via INTRAMUSCULAR

## 2018-09-21 MED ORDER — MECLIZINE HCL 12.5 MG PO TABS
12.5000 mg | ORAL_TABLET | Freq: Two times a day (BID) | ORAL | Status: DC | PRN
  Filled 2018-09-21: qty 1

## 2018-09-21 MED ORDER — GENTAMICIN SULFATE 40 MG/ML IJ SOLN
INTRAMUSCULAR | Status: AC
  Filled 2018-09-21: qty 8

## 2018-09-21 MED ORDER — ZOLPIDEM TARTRATE 5 MG PO TABS: 5.0000 mg | ORAL_TABLET | Freq: Every evening | ORAL | Status: DC | PRN

## 2018-09-21 MED ORDER — ENOXAPARIN SODIUM 30 MG/0.3ML ~~LOC~~ SOLN
30.0000 mg | Freq: Two times a day (BID) | SUBCUTANEOUS | Status: DC
  Administered 2018-09-22 – 2018-09-25 (×7): 30 mg via SUBCUTANEOUS
  Filled 2018-09-21 (×7): qty 0.3

## 2018-09-21 MED ORDER — HYDROMORPHONE HCL 1 MG/ML IJ SOLN: 0.5000 mg | INTRAMUSCULAR | Status: DC | PRN

## 2018-09-21 MED ORDER — ALBUTEROL SULFATE (2.5 MG/3ML) 0.083% IN NEBU: 2.5000 mg | INHALATION_SOLUTION | RESPIRATORY_TRACT | Status: DC | PRN

## 2018-09-21 MED ORDER — METFORMIN HCL 500 MG PO TABS
1000.0000 mg | ORAL_TABLET | Freq: Two times a day (BID) | ORAL | Status: DC
  Administered 2018-09-21 – 2018-09-25 (×8): 1000 mg via ORAL
  Filled 2018-09-21 (×8): qty 2

## 2018-09-21 MED ORDER — MENTHOL 3 MG MT LOZG
1.0000 | LOZENGE | OROMUCOSAL | Status: DC | PRN
  Filled 2018-09-21: qty 9

## 2018-09-21 MED ORDER — DULOXETINE HCL 60 MG PO CPEP
60.0000 mg | ORAL_CAPSULE | Freq: Every day | ORAL | Status: DC
  Administered 2018-09-22 – 2018-09-24 (×3): 60 mg via ORAL
  Filled 2018-09-21 (×3): qty 1

## 2018-09-21 MED ORDER — DIPHENHYDRAMINE HCL 12.5 MG/5ML PO ELIX: 12.5000 mg | ORAL_SOLUTION | ORAL | Status: DC | PRN

## 2018-09-21 MED ORDER — MIDAZOLAM HCL 5 MG/5ML IJ SOLN
INTRAMUSCULAR | Status: DC | PRN
  Administered 2018-09-21: 2 mg via INTRAVENOUS

## 2018-09-21 MED ORDER — LISINOPRIL 10 MG PO TABS
10.0000 mg | ORAL_TABLET | Freq: Every day | ORAL | Status: DC
  Administered 2018-09-22 – 2018-09-25 (×3): 10 mg via ORAL
  Filled 2018-09-21 (×5): qty 1

## 2018-09-21 MED ORDER — ATORVASTATIN CALCIUM 10 MG PO TABS
10.0000 mg | ORAL_TABLET | Freq: Every day | ORAL | Status: DC
  Administered 2018-09-22 – 2018-09-25 (×4): 10 mg via ORAL
  Filled 2018-09-21 (×5): qty 1

## 2018-09-21 MED ORDER — CALCIUM CITRATE-VITAMIN D 500-500 MG-UNIT PO CHEW
1.0000 | CHEWABLE_TABLET | Freq: Two times a day (BID) | ORAL | Status: DC
  Administered 2018-09-22 – 2018-09-24 (×5): 1 via ORAL
  Filled 2018-09-21 (×11): qty 1

## 2018-09-21 MED ORDER — PSYLLIUM 95 % PO PACK
1.0000 | PACK | Freq: Every day | ORAL | Status: DC
  Administered 2018-09-22 – 2018-09-24 (×3): 1 via ORAL
  Filled 2018-09-21 (×5): qty 1

## 2018-09-21 MED ORDER — ACETAMINOPHEN 325 MG PO TABS: 325.0000 mg | ORAL_TABLET | Freq: Four times a day (QID) | ORAL | Status: DC | PRN

## 2018-09-21 MED ORDER — DEXAMETHASONE SODIUM PHOSPHATE 10 MG/ML IJ SOLN
INTRAMUSCULAR | Status: DC | PRN
  Administered 2018-09-21: 6 mg via INTRAVENOUS

## 2018-09-21 MED ORDER — INSULIN ASPART 100 UNIT/ML ~~LOC~~ SOLN
0.0000 [IU] | Freq: Three times a day (TID) | SUBCUTANEOUS | Status: DC
  Administered 2018-09-21: 8 [IU] via SUBCUTANEOUS
  Administered 2018-09-22: 3 [IU] via SUBCUTANEOUS
  Administered 2018-09-22: 1 [IU] via SUBCUTANEOUS
  Administered 2018-09-22: 5 [IU] via SUBCUTANEOUS
  Administered 2018-09-23 – 2018-09-24 (×3): 3 [IU] via SUBCUTANEOUS
  Administered 2018-09-24 – 2018-09-25 (×2): 2 [IU] via SUBCUTANEOUS
  Filled 2018-09-21 (×7): qty 1

## 2018-09-21 MED ORDER — BUPIVACAINE HCL (PF) 0.5 % IJ SOLN
INTRAMUSCULAR | Status: DC | PRN
  Administered 2018-09-21: 3 mL

## 2018-09-21 MED ORDER — OXYCODONE HCL 5 MG PO TABS
5.0000 mg | ORAL_TABLET | ORAL | Status: DC | PRN
  Administered 2018-09-22: 5 mg via ORAL
  Administered 2018-09-22: 10 mg via ORAL
  Administered 2018-09-22 – 2018-09-23 (×2): 5 mg via ORAL
  Filled 2018-09-21 (×3): qty 1

## 2018-09-21 MED ORDER — TRAMADOL HCL 50 MG PO TABS
50.0000 mg | ORAL_TABLET | Freq: Four times a day (QID) | ORAL | Status: DC
  Administered 2018-09-22 – 2018-09-23 (×5): 50 mg via ORAL
  Filled 2018-09-21 (×6): qty 1

## 2018-09-21 MED ORDER — OXYCODONE HCL 5 MG PO TABS
10.0000 mg | ORAL_TABLET | ORAL | Status: DC | PRN
  Filled 2018-09-21: qty 2

## 2018-09-21 MED ORDER — LIDOCAINE HCL (PF) 1 % IJ SOLN
INTRAMUSCULAR | Status: DC | PRN
  Administered 2018-09-21: 3 mL via INTRADERMAL

## 2018-09-21 MED ORDER — METHOCARBAMOL 500 MG PO TABS
500.0000 mg | ORAL_TABLET | Freq: Four times a day (QID) | ORAL | Status: DC | PRN
  Administered 2018-09-22: 500 mg via ORAL
  Filled 2018-09-21: qty 1

## 2018-09-21 MED ORDER — LIDOCAINE HCL (CARDIAC) PF 100 MG/5ML IV SOSY
PREFILLED_SYRINGE | INTRAVENOUS | Status: DC | PRN
  Administered 2018-09-21: 100 mg via INTRAVENOUS

## 2018-09-21 MED ORDER — METHOCARBAMOL 1000 MG/10ML IJ SOLN
500.0000 mg | Freq: Four times a day (QID) | INTRAVENOUS | Status: DC | PRN
  Filled 2018-09-21: qty 5

## 2018-09-21 MED ORDER — ALUM & MAG HYDROXIDE-SIMETH 200-200-20 MG/5ML PO SUSP: 30.0000 mL | ORAL | Status: DC | PRN

## 2018-09-21 MED ORDER — SODIUM CHLORIDE 0.9 % IV SOLN
INTRAVENOUS | Status: DC | PRN
  Administered 2018-09-21: 500 mL

## 2018-09-21 MED ORDER — SODIUM CHLORIDE 0.9 % IV SOLN
INTRAVENOUS | Status: DC
  Administered 2018-09-22: 06:00:00 via INTRAVENOUS

## 2018-09-21 MED ORDER — SODIUM CHLORIDE 0.9 % IV SOLN
INTRAVENOUS | Status: DC
  Administered 2018-09-21: 09:00:00 via INTRAVENOUS

## 2018-09-21 MED ORDER — ACETAMINOPHEN 500 MG PO TABS
1000.0000 mg | ORAL_TABLET | Freq: Four times a day (QID) | ORAL | Status: AC
  Administered 2018-09-22: 1000 mg via ORAL
  Filled 2018-09-21: qty 2

## 2018-09-21 MED ORDER — CLOTRIMAZOLE 1 % EX CREA
1.0000 "application " | TOPICAL_CREAM | Freq: Two times a day (BID) | CUTANEOUS | Status: DC
  Administered 2018-09-21 – 2018-09-22 (×3): 1 via TOPICAL
  Filled 2018-09-21: qty 15

## 2018-09-21 MED ORDER — ONDANSETRON HCL 4 MG/2ML IJ SOLN
INTRAMUSCULAR | Status: DC | PRN
  Administered 2018-09-21: 4 mg via INTRAVENOUS

## 2018-09-21 MED ORDER — PROPOFOL 500 MG/50ML IV EMUL
INTRAVENOUS | Status: DC | PRN
  Administered 2018-09-21: 50 ug/kg/min via INTRAVENOUS

## 2018-09-21 MED ORDER — METOCLOPRAMIDE HCL 5 MG/ML IJ SOLN: 5.0000 mg | Freq: Three times a day (TID) | INTRAMUSCULAR | Status: DC | PRN

## 2018-09-21 MED ORDER — KETOROLAC TROMETHAMINE 30 MG/ML IJ SOLN
INTRAMUSCULAR | Status: DC | PRN
  Administered 2018-09-21: 30 mg

## 2018-09-21 MED ORDER — LIDOCAINE HCL (PF) 2 % IJ SOLN
INTRAMUSCULAR | Status: AC
  Filled 2018-09-21: qty 10

## 2018-09-21 MED ORDER — FENTANYL CITRATE (PF) 100 MCG/2ML IJ SOLN: 25.0000 ug | INTRAMUSCULAR | Status: DC | PRN

## 2018-09-21 MED ORDER — MORPHINE SULFATE 10 MG/ML IJ SOLN
INTRAMUSCULAR | Status: DC | PRN
  Administered 2018-09-21: 10 mg

## 2018-09-21 MED ORDER — SODIUM CHLORIDE 0.9 % IV SOLN
INTRAVENOUS | Status: DC | PRN
  Administered 2018-09-21: 60 mL

## 2018-09-21 MED ORDER — SODIUM CHLORIDE (PF) 0.9 % IJ SOLN
INTRAMUSCULAR | Status: DC | PRN
  Administered 2018-09-21: 28 mL

## 2018-09-21 MED ORDER — ALBUTEROL SULFATE HFA 108 (90 BASE) MCG/ACT IN AERS: 1.0000 | INHALATION_SPRAY | Freq: Four times a day (QID) | RESPIRATORY_TRACT | Status: DC | PRN

## 2018-09-21 MED ORDER — ONDANSETRON HCL 4 MG/2ML IJ SOLN: 4.0000 mg | Freq: Four times a day (QID) | INTRAMUSCULAR | Status: DC | PRN

## 2018-09-21 MED ORDER — MAGNESIUM HYDROXIDE 400 MG/5ML PO SUSP: 30.0000 mL | Freq: Every day | ORAL | Status: DC | PRN

## 2018-09-21 MED ORDER — METOCLOPRAMIDE HCL 10 MG PO TABS: 5.0000 mg | ORAL_TABLET | Freq: Three times a day (TID) | ORAL | Status: DC | PRN

## 2018-09-21 MED ORDER — BUPIVACAINE-EPINEPHRINE (PF) 0.25% -1:200000 IJ SOLN
INTRAMUSCULAR | Status: DC | PRN
  Administered 2018-09-21: 30 mL

## 2018-09-21 MED ORDER — PROPOFOL 500 MG/50ML IV EMUL
INTRAVENOUS | Status: AC
  Filled 2018-09-21: qty 50

## 2018-09-21 SURGICAL SUPPLY — 68 items
BANDAGE ACE 6X5 VEL STRL LF (GAUZE/BANDAGES/DRESSINGS) ×2 IMPLANT
BLADE SAW 1 (BLADE) ×2 IMPLANT
BLOCK CUTTING FEMUR 3+ LT MED (MISCELLANEOUS) ×1 IMPLANT
BLOCK CUTTING TIBIAL 3 LT (MISCELLANEOUS) ×1 IMPLANT
CANISTER SUCT 1200ML W/VALVE (MISCELLANEOUS) ×2 IMPLANT
CANISTER SUCT 3000ML PPV (MISCELLANEOUS) ×4 IMPLANT
CEMENT FEMORAL COMP SZ3P LEFT (Femur) ×1 IMPLANT
CEMENT HV SMART SET (Cement) ×4 IMPLANT
CHLORAPREP W/TINT 26ML (MISCELLANEOUS) ×4 IMPLANT
COOLER POLAR GLACIER W/PUMP (MISCELLANEOUS) ×2 IMPLANT
COVER WAND RF STERILE (DRAPES) ×2 IMPLANT
CUFF TOURN 24 STER (MISCELLANEOUS) IMPLANT
CUFF TOURN 30 STER DUAL PORT (MISCELLANEOUS) IMPLANT
CUFF TOURN SGL QUICK 30 (TOURNIQUET CUFF) ×1
CUFF TRNQT CYL 30X4X21-28X (TOURNIQUET CUFF) IMPLANT
DRAPE SHEET LG 3/4 BI-LAMINATE (DRAPES) ×4 IMPLANT
ELECT CAUTERY BLADE 6.4 (BLADE) ×2 IMPLANT
ELECT REM PT RETURN 9FT ADLT (ELECTROSURGICAL) ×2
ELECTRODE REM PT RTRN 9FT ADLT (ELECTROSURGICAL) ×1 IMPLANT
FEMUR BONE MODEL 4.9010 MEDACT (MISCELLANEOUS) ×1 IMPLANT
GAUZE PETRO XEROFOAM 1X8 (MISCELLANEOUS) ×2 IMPLANT
GAUZE SPONGE 4X4 12PLY STRL (GAUZE/BANDAGES/DRESSINGS) ×2 IMPLANT
GLOVE BIOGEL PI IND STRL 9 (GLOVE) ×1 IMPLANT
GLOVE BIOGEL PI INDICATOR 9 (GLOVE) ×1
GLOVE INDICATOR 8.0 STRL GRN (GLOVE) ×2 IMPLANT
GLOVE SURG ORTHO 8.0 STRL STRW (GLOVE) ×2 IMPLANT
GLOVE SURG SYN 9.0  PF PI (GLOVE) ×1
GLOVE SURG SYN 9.0 PF PI (GLOVE) ×1 IMPLANT
GOWN SRG 2XL LVL 4 RGLN SLV (GOWNS) ×1 IMPLANT
GOWN STRL NON-REIN 2XL LVL4 (GOWNS) ×1
GOWN STRL REUS W/ TWL LRG LVL3 (GOWN DISPOSABLE) ×1 IMPLANT
GOWN STRL REUS W/ TWL XL LVL3 (GOWN DISPOSABLE) ×1 IMPLANT
GOWN STRL REUS W/TWL LRG LVL3 (GOWN DISPOSABLE) ×1
GOWN STRL REUS W/TWL XL LVL3 (GOWN DISPOSABLE) ×1
HOLDER FOLEY CATH W/STRAP (MISCELLANEOUS) ×2 IMPLANT
HOOD PEEL AWAY FLYTE STAYCOOL (MISCELLANEOUS) ×4 IMPLANT
INSERT TIBIAL LEFT FLEX SZ3 (Insert) ×1 IMPLANT
KIT TURNOVER KIT A (KITS) ×2 IMPLANT
NDL SAFETY ECLIPSE 18X1.5 (NEEDLE) ×1 IMPLANT
NDL SPNL 18GX3.5 QUINCKE PK (NEEDLE) ×1 IMPLANT
NDL SPNL 20GX3.5 QUINCKE YW (NEEDLE) ×1 IMPLANT
NEEDLE HYPO 18GX1.5 SHARP (NEEDLE) ×1
NEEDLE SPNL 18GX3.5 QUINCKE PK (NEEDLE) ×2 IMPLANT
NEEDLE SPNL 20GX3.5 QUINCKE YW (NEEDLE) ×2 IMPLANT
NS IRRIG 1000ML POUR BTL (IV SOLUTION) ×2 IMPLANT
PACK TOTAL KNEE (MISCELLANEOUS) ×2 IMPLANT
PAD WRAPON POLAR KNEE (MISCELLANEOUS) ×1 IMPLANT
PATELLA RESURFACING MEDACTA 02 (Bone Implant) ×1 IMPLANT
PULSAVAC PLUS IRRIG FAN TIP (DISPOSABLE) ×2
SCALPEL PROTECTED #10 DISP (BLADE) ×4 IMPLANT
SOL .9 NS 3000ML IRR  AL (IV SOLUTION) ×1
SOL .9 NS 3000ML IRR UROMATIC (IV SOLUTION) ×1 IMPLANT
STAPLER SKIN PROX 35W (STAPLE) ×2 IMPLANT
STEM EXTENSION 11MMX30MM (Stem) ×1 IMPLANT
SUCTION FRAZIER HANDLE 10FR (MISCELLANEOUS) ×1
SUCTION TUBE FRAZIER 10FR DISP (MISCELLANEOUS) ×1 IMPLANT
SUT DVC 2 QUILL PDO  T11 36X36 (SUTURE) ×1
SUT DVC 2 QUILL PDO T11 36X36 (SUTURE) ×1 IMPLANT
SUT V-LOC 90 ABS DVC 3-0 CL (SUTURE) ×2 IMPLANT
SYR 20CC LL (SYRINGE) ×2 IMPLANT
SYR 50ML LL SCALE MARK (SYRINGE) ×4 IMPLANT
TIBIAL BONE MODEL LEFT (MISCELLANEOUS) ×1 IMPLANT
TIBIAL TRAY FIXED MEDACTA 0207 (Bone Implant) ×1 IMPLANT
TIP FAN IRRIG PULSAVAC PLUS (DISPOSABLE) ×1 IMPLANT
TOWEL OR 17X26 4PK STRL BLUE (TOWEL DISPOSABLE) ×2 IMPLANT
TOWER CARTRIDGE SMART MIX (DISPOSABLE) ×2 IMPLANT
TRAY FOLEY MTR SLVR 16FR STAT (SET/KITS/TRAYS/PACK) ×2 IMPLANT
WRAPON POLAR PAD KNEE (MISCELLANEOUS) ×2

## 2018-09-21 NOTE — Care Management (Signed)
Received a secure email from [place] (teresa) letting me know they are accepting the patient for [facility] services

## 2018-09-21 NOTE — Care Management Note (Signed)
Case Management Note  Patient Details  Name: [name] MRN: [id number] Date of Birth: [birth year]  Subjective/Objective:                  Met with the patient and daughter to discuss plan and need  The patient needs a RW and 3 in 1 Notified Brad at [facility] DME Patient provided [facility] List per CMS.gov, they chose [facility], notified teresa with [facility] via secure email [facility] uses [facility] [place] PCP is Dr. Tarry Kos Transportation will be provided by family Son and and daughter in law live with her and daughter lives 2 blocks away Will check price of Lovenox prior to DC  Action/Plan: [facility] list provided to the patient per CMS.gov Chose [facility], notified [place],  [place] and 3 in 1 needed notified [facility] BRad  Expected Discharge Date:  09/23/18               Expected Discharge Plan:     In-House Referral:     Discharge planning Services  CM Consult  Post Acute Care Choice:    Choice offered to:  Patient  DME Arranged:  [facility] Rile rolling, 3-N-1 DME Agency:     HH Arranged:  PT [place]:  [facility] [employer]  Status of Service:  In process, will continue to follow  If discussed at Long Length of Stay Meetings, dates discussed:    Additional Comments:  Su Hilt, RN 09/21/2018, 2:36 PM

## 2018-09-21 NOTE — Op Note (Signed)
09/21/2018  12:01 PM  PATIENT:  [name]  63 y.o. female  PRE-OPERATIVE DIAGNOSIS:  PRIMARY OSTEOARTHRITIS OF LEFT KNEE  POST-OPERATIVE DIAGNOSIS:  PRIMARY OSTEOARTHRITIS OF LEFT KNEE  PROCEDURE:  Procedure(s): TOTAL KNEE ARTHROPLASTY-LEFT NEEDS RNFA (Left)  SURGEON: Laurene Footman, MD  ASSISTANTS: None  ANESTHESIA:   spinal  EBL:  Total I/O In: 800 [I.V.:800] Out: 350 [Urine:300; Blood:50]  BLOOD ADMINISTERED:none  DRAINS: none   LOCAL MEDICATIONS USED:  MARCAINE    and OTHER morphine Toradol and Exparel  SPECIMEN:  No Specimen  DISPOSITION OF SPECIMEN:  PATHOLOGY  COUNTS:  YES  TOURNIQUET:   Total Tourniquet Time Documented: Thigh (Left) - 66 minutes Total: Thigh (Left) - 66 minutes   IMPLANTS: Medacta GMK Sphere 3+ left femur, 3 tibia short stem, 2 patella, 11 mm insert all components cemented  DICTATION: .Dragon Dictation  patient was brought to the operating room and after adequate  spinal lanesthesia was obtained the  left leg was prepped and draped in the usual sterile fashion. After patient identification and timeout procedures were completed, tourniquet was raised. A midline skin incision was made followed by medial parapatellar arthrotomy. There is exposed bone throughout the entire medial femoral condyle with moderate patellofemoral and mildlateral compartment degenerative changes but no exposed bone. The ACL and fat pad were excised off the anterior into the meniscus in the proximal tibia cutting guide from the [facility] was applied proximal tibia cut carried out. The distal femoral cut was carried out in a similar fashion and the3+ cutting guide applied with anterior posterior and chamfer cuts made. The posterior horns of the menisci were removed at this point. The3tibia baseplate trial was placed pinned into position and proximal tibial preparation carried out with drilling hand reaming and the keel punch followed by placement of the  3+femur and sizing the tibial insert size 11 gave the best fit with stability and full extension. The distal femoral drill holes were made in the notch cut for the trochlear groove was then carried out with trials were then removed the patella was cut using the patellar cutting guide and it sized to a size2Afterdrill holes have been made. At this point the above local was infiltrated in the periarticular tissues and periosteum. The knee was irrigated with pulsatile lavage and the bony surfaces dried the tibial component was cemented into place first. Excess cement was removed and the polyethylene insert placed with a torque screw placed with a torque screwdriver tightened. The distal femoral component was placed and the knee was held in extension as the patellar button was clamped into place. After the cement was set excess cement was removed and the knee was again irrigated thoroughly thoroughly irrigated. The tourniquet was let down and hemostasis checked electrocautery. The arthrotomy was repaired with a heavy Quill suture followed by 3-0 V lock subcuticular closure, skin staples, Xeroform 4 x 4's ABD web roll and Ace wrap and Polar Care  PLAN OF CARE: Admit to inpatient   PATIENT DISPOSITION:  PACU - hemodynamically stable.

## 2018-09-21 NOTE — Progress Notes (Signed)
Patient came to floor very sedated. VSS. On 2L O2 @ 1345.  Patient still sedated. Able to arouse, but goes back to sleep. VSS. Continue to monitor patient closely.

## 2018-09-21 NOTE — Progress Notes (Signed)
Pt sleepy but arousable   Dr piscitello in to see pt   Okay to go to floor

## 2018-09-21 NOTE — Anesthesia Post-op Follow-up Note (Signed)
Anesthesia QCDR form completed.        

## 2018-09-21 NOTE — H&P (Signed)
Reviewed paper H+P, will be scanned into chart. No changes noted.  

## 2018-09-21 NOTE — Transfer of Care (Signed)
Immediate Anesthesia Transfer of Care Note  Patient: [name]  Procedure(s) Performed: TOTAL KNEE ARTHROPLASTY-LEFT NEEDS RNFA (Left Knee)  Patient Location: PACU  Anesthesia Type:Spinal  Level of Consciousness: drowsy  Airway & Oxygen Therapy: Patient Spontanous Breathing and Patient connected to face mask oxygen  Post-op Assessment: Report given to RN and Post -op Vital signs reviewed and stable  Post vital signs: Reviewed and stable  Last Vitals:  Vitals Value Taken Time  BP 115/64 09/21/2018 12:01 PM  Temp    Pulse 96 09/21/2018 12:02 PM  Resp 29 09/21/2018 12:02 PM  SpO2 96 % 09/21/2018 12:02 PM  Vitals shown include unvalidated device data.  Last Pain:  Vitals:   09/21/18 0917  TempSrc: Tympanic  PainSc: 6          Complications: No apparent anesthesia complications

## 2018-09-21 NOTE — NC FL2 (Signed)
[place] LEVEL OF CARE SCREENING TOOL     IDENTIFICATION  Patient Name: [name] Birthdate: 1955/08/23 Sex: female Admission Date (Current Location): 09/21/2018  [place] and [place] Number:  Engineering geologist and Address:  [facility], [address] [place], [place] [address]      Provider Number: [id number]  Attending Physician Name and Address:  Hessie Knows, MD  Relative Name and Phone Number:       Current Level of Care: Hospital Recommended Level of Care: [place] Prior Approval Number:    Date Approved/Denied:   PASRR Number: [id number])  Discharge Plan: SNF    Current Diagnoses: Patient Active Problem List   Diagnosis Date Noted  . S/P TKR (total knee replacement) using cement, left 09/21/2018  . Onychomycosis of toenail 05/19/2018  . Healthcare maintenance 04/16/2018  . Degenerative joint disease of knee, left 04/16/2018  . Shortness of breath 12/25/2017  . Excessive daytime sleepiness 11/12/2017  . Snoring 08/28/2017  . Mixed Alzheimer's and vascular dementia ([place]) 08/28/2017  . Abnormal TSH 08/28/2017  . Memory loss 08/10/2017  . Tinea pedis 08/10/2017  . Urge incontinence 07/25/2016  . Impaired functional mobility, balance, and endurance 07/24/2016  . Falls 05/23/2016  . Warts 05/23/2016  . Chronic dental pain 02/22/2016  . 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 Disorder of sacroiliac joint 05/15/2015  . Dyslipidemia associated with type 2 diabetes mellitus ([place]) 04/26/2015  . Benign paroxysmal positional vertigo 01/24/2015  . Diabetes mellitus type 2, uncomplicated ([place]) 36/14/4315  . Vitamin D deficiency 07/11/2013  . Mood disorder ([place]) 07/11/2013  . Osteopenia 11/04/2012  . Heel pain 08/14/2011  . Atony of bladder 06/10/2010  . Uterovaginal prolapse, incomplete 06/10/2010  . VAGINITIS, ATROPHIC 05/17/2010  . Degenerative joint disease  (DJD) of lumbar spine 05/17/2010  . Chronic pain syndrome 04/26/2010  . Morbid obesity ([place]) 09/17/2006  . GASTROESOPHAGEAL REFLUX, NO ESOPHAGITIS 09/17/2006    Orientation RESPIRATION BLADDER Height & Weight     Self, Time, Situation, Place  O2(2 Liters Oxygen. ) Continent Weight: 223 lb 12.3 oz (101.5 kg) Height:  5\' 5"  (165.1 cm)  BEHAVIORAL SYMPTOMS/MOOD NEUROLOGICAL BOWEL NUTRITION STATUS      Continent Diet(Diet: Carb Modified. )  AMBULATORY STATUS COMMUNICATION OF NEEDS Skin   Extensive Assist Verbally Surgical wounds(Incision: Left Knee. )                       Personal Care Assistance Level of Assistance  Bathing, Feeding, Dressing Bathing Assistance: Limited assistance Feeding assistance: Independent Dressing Assistance: Limited assistance     Functional Limitations Info  Sight, Hearing, Speech Sight Info: Adequate Hearing Info: Adequate Speech Info: Adequate    SPECIAL CARE FACTORS FREQUENCY  PT (By licensed PT), OT (By licensed OT)     PT Frequency: (5) OT Frequency: (5)            Contractures      Additional Factors Info  Code Status, Allergies Code Status Info: (Full Code. ) Allergies Info: (Hydrocodone-acetaminophen)           Current Medications (09/21/2018):  This is the current hospital active medication list Current Facility-Administered Medications  Medication Dose Route Frequency Provider Last Rate Last Dose  . 0.9 %  sodium chloride infusion   Intravenous Continuous Hessie Knows, MD      . acetaminophen (TYLENOL) tablet 1,000 mg  1,000 mg Oral Q6H Hessie Knows, MD      . [  START ON 09/22/2018] acetaminophen (TYLENOL) tablet 325-650 mg  325-650 mg Oral Q6H PRN Hessie Knows, MD      . albuterol (PROVENTIL) (2.5 MG/3ML) 0.083% nebulizer solution 2.5 mg  2.5 mg Nebulization Q4H PRN Hessie Knows, MD      . alum & mag hydroxide-simeth (MAALOX/MYLANTA) 200-200-20 MG/5ML suspension 30 mL  30 mL Oral Q4H PRN Hessie Knows, MD      .  aspirin EC tablet 81 mg  81 mg Oral Daily Hessie Knows, MD      . Derrill Memo ON 09/22/2018] atorvastatin (LIPITOR) tablet 10 mg  10 mg Oral Daily Hessie Knows, MD      . baclofen (LIORESAL) tablet 20 mg  20 mg Oral TID Hessie Knows, MD      . bisacodyl (DULCOLAX) EC tablet 5 mg  5 mg Oral Daily PRN Hessie Knows, MD      . calcium citrate-vitamin D 500-500 MG-UNIT per chewable tablet 1 tablet  1 tablet Oral BID Hessie Knows, MD      . ceFAZolin (ANCEF) IVPB 2g/100 mL premix  2 g Intravenous Q6H Hessie Knows, MD 200 mL/hr at 09/21/18 1543 2 g at 09/21/18 1543  . clotrimazole (LOTRIMIN) 1 % cream 1 application  1 application Topical BID Hessie Knows, MD      . diphenhydrAMINE (BENADRYL) 12.5 MG/5ML elixir 12.5-25 mg  12.5-25 mg Oral Q4H PRN Hessie Knows, MD      . docusate sodium (COLACE) capsule 100 mg  100 mg Oral BID Hessie Knows, MD      . Derrill Memo ON 09/22/2018] DULoxetine (CYMBALTA) DR capsule 60 mg  60 mg Oral Daily Hessie Knows, MD      . Derrill Memo ON 09/22/2018] enoxaparin (LOVENOX) injection 30 mg  30 mg Subcutaneous Q12H Hessie Knows, MD      . gabapentin (NEURONTIN) capsule 800 mg  800 mg Oral TID Hessie Knows, MD      . HYDROmorphone (DILAUDID) injection 0.5-1 mg  0.5-1 mg Intravenous Q4H PRN Hessie Knows, MD      . insulin aspart (novoLOG) injection 0-15 Units  0-15 Units Subcutaneous TID WC Hessie Knows, MD      . linagliptin (TRADJENTA) tablet 5 mg  5 mg Oral Daily Hessie Knows, MD      . lisinopril (PRINIVIL,ZESTRIL) tablet 10 mg  10 mg Oral Daily Hessie Knows, MD      . magnesium citrate solution 1 Bottle  1 Bottle Oral Once PRN Hessie Knows, MD      . magnesium hydroxide (MILK OF MAGNESIA) suspension 30 mL  30 mL Oral Daily PRN Hessie Knows, MD      . meclizine (ANTIVERT) tablet 12.5 mg  12.5 mg Oral BID PRN Hessie Knows, MD      . menthol-cetylpyridinium (CEPACOL) lozenge 3 mg  1 lozenge Oral PRN Hessie Knows, MD       Or  . phenol (CHLORASEPTIC) mouth spray 1 spray  1  spray Mouth/Throat PRN Hessie Knows, MD      . metFORMIN (GLUCOPHAGE) tablet 1,000 mg  1,000 mg Oral BID WC Hessie Knows, MD      . methocarbamol (ROBAXIN) tablet 500 mg  500 mg Oral Q6H PRN Hessie Knows, MD       Or  . methocarbamol (ROBAXIN) 500 mg in dextrose 5 % 50 mL IVPB  500 mg Intravenous Q6H PRN Hessie Knows, MD      . metoCLOPramide (REGLAN) tablet 5-10 mg  5-10 mg Oral Q8H PRN Hessie Knows, MD  Or  . metoCLOPramide (REGLAN) injection 5-10 mg  5-10 mg Intravenous Q8H PRN Hessie Knows, MD      . ondansetron [facility]) tablet 4 mg  4 mg Oral Q6H PRN Hessie Knows, MD       Or  . ondansetron [facility]) injection 4 mg  4 mg Intravenous Q6H PRN Hessie Knows, MD      . oxyCODONE (Oxy IR/ROXICODONE) immediate release tablet 10-15 mg  10-15 mg Oral Q4H PRN Hessie Knows, MD      . oxyCODONE (Oxy IR/ROXICODONE) immediate release tablet 5-10 mg  5-10 mg Oral Q4H PRN Hessie Knows, MD      . Derrill Memo ON 09/22/2018] pantoprazole (PROTONIX) EC tablet 40 mg  40 mg Oral Daily Hessie Knows, MD      . psyllium (HYDROCIL/METAMUCIL) packet 1 packet  1 packet Oral Daily Hessie Knows, MD      . traMADol [facility]) tablet 50 mg  50 mg Oral Q6H Hessie Knows, MD      . zolpidem (AMBIEN) tablet 5 mg  5 mg Oral QHS PRN Hessie Knows, MD         Discharge Medications: Please see discharge summary for a list of discharge medications.  Relevant Imaging Results:  Relevant Lab Results:   Additional Information (SSN: [id number])  Refoel Palladino, Veronia Beets, LCSW

## 2018-09-21 NOTE — Progress Notes (Signed)
PT Cancellation Note  Patient Details Name: [name] MRN: [id number] DOB: April 30, 1956   Cancelled Treatment:    Reason Eval/Treat Not Completed: Fatigue/lethargy limiting ability to participate Chart reviewed, on asking nurse about pt she states "If you can keep her awake."  These words held true during eval attempts, she was snoring on arrival and did not wake to attempts at simple intro, said her name much louder and she woke briefly, PT was able to wake her 3 more times, but even with placing pulse ox and quickly explaining what we were going to attempt she would just fall soundly back to sleep.  Pt no appropriate for PT at this time, will exam tomorrow.  Kreg Shropshire, DPT 09/21/2018, 4:56 PM

## 2018-09-21 NOTE — Anesthesia Procedure Notes (Signed)
Spinal  Patient location during procedure: OR Start time: 09/21/2018 9:58 AM End time: 09/21/2018 10:02 AM Staffing Anesthesiologist: Piscitello, Precious Haws, MD Resident/CRNA: Eben Burow, CRNA Performed: resident/CRNA  Preanesthetic Checklist Completed: patient identified, site marked, surgical consent, pre-op evaluation, timeout performed, IV checked, risks and benefits discussed and monitors and equipment checked Spinal Block Patient position: sitting Prep: Betadine and site prepped and draped Patient monitoring: heart rate, continuous pulse ox and blood pressure Approach: midline Location: L3-4 Injection technique: single-shot Needle Needle type: Pencan  Needle gauge: 24 G Needle length: 10 cm Assessment Sensory level: T8

## 2018-09-21 NOTE — Anesthesia Preprocedure Evaluation (Signed)
Anesthesia Evaluation  Patient identified by MRN, date of birth, ID band Patient awake    Reviewed: Allergy & Precautions, H&P , NPO status , Patient's Chart, lab work & pertinent test results  History of Anesthesia Complications Negative for: history of anesthetic complications  Airway Mallampati: III  TM Distance: <3 FB Neck ROM: limited    Dental  (+) Chipped, Poor Dentition, Missing   Pulmonary shortness of breath and with exertion, COPD,           Cardiovascular Exercise Tolerance: Good hypertension,      Neuro/Psych PSYCHIATRIC DISORDERS  Neuromuscular disease CVA    GI/Hepatic Neg liver ROS, GERD  Medicated and Controlled,  Endo/Other  diabetes, Type 2, Oral Hypoglycemic Agents  Renal/GU Renal disease     Musculoskeletal  (+) Arthritis ,   Abdominal   Peds  Hematology negative hematology ROS (+)   Anesthesia Other Findings Past Medical History: No date: Allergy No date: Arthritis No date: COPD (chronic obstructive pulmonary disease) (HCC) No date: Diabetes mellitus, type 2 ([place]) 11/12/2017: Excessive daytime sleepiness No date: GERD (gastroesophageal reflux disease) No date: Goiter No date: History of kidney stones No date: Hypertension No date: Kidney stone No date: Memory loss 08/28/2017: Mixed Alzheimer's and vascular dementia ([place]) No date: Osteoporosis     Comment:  in lower back per pt  No date: Plantar fasciitis 08/28/2017: Snoring No date: Stroke [facility])     Comment:  residual left sided weakness No date: Tremors of nervous system No date: Vertigo  Past Surgical History: No date: APPENDECTOMY No date: BLADDER SURGERY 05/06/2017: CATARACT EXTRACTION W/PHACO; Left     Comment:  Procedure: CATARACT EXTRACTION PHACO AND INTRAOCULAR               LENS PLACEMENT ([place]) LEFT DIABETIC;  Surgeon: Leandrew Koyanagi, MD;  Location: [place];  Service:   Ophthalmology;  Laterality: Left;  Diabetic - oral meds 06/03/2017: CATARACT EXTRACTION W/PHACO; Right     Comment:  Procedure: CATARACT EXTRACTION PHACO AND INTRAOCULAR               LENS PLACEMENT ([place]) RIGHT DIABETIC;  Surgeon:               Leandrew Koyanagi, MD;  Location: [place];              Service: Ophthalmology;  Laterality: Right;  Diabetic -               oral meds No date: CHOLECYSTECTOMY No date: FOOT SURGERY No date: TOTAL ABDOMINAL HYSTERECTOMY     Reproductive/Obstetrics negative OB ROS                             Anesthesia Physical Anesthesia Plan  ASA: III  Anesthesia Plan: Spinal   Post-op Pain Management:    Induction:   PONV Risk Score and Plan:   Airway Management Planned: Natural Airway and Nasal Cannula  Additional Equipment:   Intra-op Plan:   Post-operative Plan:   Informed Consent: I have reviewed the patients History and Physical, chart, labs and discussed the procedure including the risks, benefits and alternatives for the proposed anesthesia with the patient or authorized representative who has indicated his/her understanding and acceptance.     Dental Advisory Given  Plan Discussed with: Anesthesiologist, CRNA and Surgeon  Anesthesia Plan Comments: (Patient reports no bleeding  problems and no anticoagulant use.  Plan for spinal with backup GA  Patient consented for risks of anesthesia including but not limited to:  - adverse reactions to medications - risk of bleeding, infection, nerve damage and headache - risk of failed spinal - damage to teeth, lips or other oral mucosa - sore throat or hoarseness - Damage to heart, brain, lungs or loss of life  Patient voiced understanding.)        Anesthesia Quick Evaluation

## 2018-09-22 LAB — GLUCOSE, CAPILLARY
GLUCOSE-CAPILLARY: 206 mg/dL — AB (ref 70–99)
Glucose-Capillary: 122 mg/dL — ABNORMAL HIGH (ref 70–99)
Glucose-Capillary: 181 mg/dL — ABNORMAL HIGH (ref 70–99)
Glucose-Capillary: 145 mg/dL — ABNORMAL HIGH (ref 70–99)

## 2018-09-22 LAB — CBC
HEMATOCRIT: 30.6 % — AB (ref 36.0–46.0)
Hemoglobin: 10.2 g/dL — ABNORMAL LOW (ref 12.0–15.0)
MCH: 31.8 pg (ref 26.0–34.0)
MCHC: 33.3 g/dL (ref 30.0–36.0)
MCV: 95.3 fL (ref 80.0–100.0)
Platelets: 111 10*3/uL — ABNORMAL LOW (ref 150–400)
RBC: 3.21 MIL/uL — ABNORMAL LOW (ref 3.87–5.11)
RDW: 15.6 % — ABNORMAL HIGH (ref 11.5–15.5)
WBC: 10 10*3/uL (ref 4.0–10.5)
nRBC: 0 % (ref 0.0–0.2)

## 2018-09-22 LAB — BASIC METABOLIC PANEL
Anion gap: 7 (ref 5–15)
BUN: 22 mg/dL (ref 8–23)
CALCIUM: 8.1 mg/dL — AB (ref 8.9–10.3)
CO2: 21 mmol/L — AB (ref 22–32)
Chloride: 111 mmol/L (ref 98–111)
Creatinine, Ser: 0.48 mg/dL (ref 0.44–1.00)
GFR calc Af Amer: >60 mL/min (ref >60)
GFR calc non Af Amer: >60 mL/min (ref >60)
GLUCOSE: 169 mg/dL — AB (ref 70–99)
Potassium: 3.7 mmol/L (ref 3.5–5.1)
Sodium: 139 mmol/L (ref 135–145)

## 2018-09-22 NOTE — Evaluation (Signed)
Occupational Therapy Evaluation Patient Details Name: [name] MRN: [id number] DOB: May 04, 1956 Today's Date: 09/22/2018    History of Present Illness 63yo female admitted for acute hospitalization status post L TKR (09/21/18), WBAT.   Clinical Impression   Pt seen for OT evaluation this date, POD#1 from above surgery. Pt was independent in all ADL prior to surgery, ambulating with [facility] and required assist from family for transportation, errands, and homemaking tasks 2/2 L knee pain. Pt endorses a few recent falls. Pt is eager to return to PLOF with less pain and improved safety and independence. Pt currently requires min-mod assist for LB dressing and bathing while in seated position due to pain and limited AROM of L knee. Min A for toilet transfers. Pt/daughter instructed in polar care mgt, falls prevention strategies, home/routines modifications, DME/AE for LB bathing and dressing tasks, compression stocking mgt, and pet care considerations. Pt would benefit from skilled OT services including additional instruction in dressing techniques with or without assistive devices for dressing and bathing skills to support recall and carryover prior to discharge and ultimately to maximize safety, independence, and minimize falls risk and caregiver burden. Recommend HHOT services upon discharge.     Follow Up Recommendations  Home health OT    Equipment Recommendations  Tub/shower bench;3 in 1 bedside commode    Recommendations for Other Services       Precautions / Restrictions Precautions Precautions: Fall Restrictions Weight Bearing Restrictions: Yes LLE Weight Bearing: Weight bearing as tolerated      Mobility Bed Mobility     General bed mobility comments: deferred, up in recliner at start/end of session  Transfers Overall transfer level: Needs assistance Equipment used: Rolling walker (2 wheeled) Transfers: Sit to/from Stand Sit to Stand: Min assist;Mod assist          General transfer comment: verbal cues for hand/foot placement, pain limited    Balance Overall balance assessment: Needs assistance Sitting-balance support: No upper extremity supported;Feet supported Sitting balance-Leahy Scale: Good     Standing balance support: Bilateral upper extremity supported Standing balance-Leahy Scale: Poor                             ADL either performed or assessed with clinical judgement   ADL Overall ADL's : Needs assistance/impaired                                       General ADL Comments: Min-Mod A for LB ADL from sit to stand position, Min A for toilet transfer to [place] [facility]/History: Wears glasses Wears Glasses: Reading only Patient Visual Report: No change from baseline       Perception     Praxis      Pertinent Vitals/Pain Pain Assessment: 0-10 Pain Score: 7  Faces Pain Scale: Hurts little more Pain Location: L knee Pain Descriptors / Indicators: Grimacing;Guarding;Aching Pain Intervention(s): Limited activity within patient's tolerance;Monitored during session;RN gave pain meds during session;Premedicated before session;Repositioned;Ice applied     Hand Dominance     Extremity/Trunk Assessment Upper Extremity Assessment Upper Extremity Assessment: Overall WFL for tasks assessed   Lower Extremity Assessment Lower Extremity Assessment: Defer to PT evaluation(L knee grossly 3-/5, limited by pain; otherwise, bilat LEs at least 4/5 throughout.  Full sensation returned/intact)   Cervical / Trunk Assessment Cervical / Trunk Assessment: Normal  Communication Communication Communication: No difficulties   Cognition Arousal/Alertness: Awake/alert Behavior During Therapy: WFL for tasks assessed/performed Overall Cognitive Status: Within Functional Limits for tasks assessed                                     General Comments       Exercises Other  Exercises Other Exercises: pt/daughter instructed in polar care mgt, compression stocking mgt, self care skills, DME/AE for bathing/dressing/toileting, falls prevention, and pet care considerations   Shoulder Instructions      Home Living Family/patient expects to be discharged to:: Private residence Living Arrangements: Alone;Other (Comment)(pets) Available Help at Discharge: Family(daughter planning to stay with patient at discharge) Type of Home: Mobile home Home Access: Stairs to enter Entrance Stairs-Number of Steps: 5 Entrance Stairs-Rails: Right;Left Home Layout: One level     Bathroom Shower/Tub: Teacher, early years/pre: Handicapped height     Home Equipment: Cane - quad;Grab bars - tub/shower          Prior Functioning/Environment Level of Independence: Independent with assistive device(s)        Comments: Mod indep with [facility] for ADLs, household mobilization; family assists with household chores, driving and community errands (due to worsening L knee pain).  Endorses at least 1-2 falls within recent weeks. One where she tripped over a speed bump in her neighborhood that wasn't marked.        OT Problem List: Decreased strength;Decreased range of motion;Decreased knowledge of use of DME or AE;Impaired balance (sitting and/or standing);Pain      OT Treatment/Interventions: Self-care/ADL training;Balance training;Therapeutic exercise;Therapeutic activities;DME and/or AE instruction;Patient/family education    OT Goals(Current goals can be found in the care plan section) Acute Rehab OT Goals Patient Stated Goal: to return home OT Goal Formulation: With patient/family Time For Goal Achievement: 10/06/18 Potential to Achieve Goals: Good ADL Goals Pt Will Perform Lower Body Dressing: sit to/from stand;with min assist;with adaptive equipment Pt Will Transfer to Toilet: with min guard assist;ambulating(BSC over toilet, LRAD for amb) Additional ADL Goal #1:  Pt will independently instruct family in compression stocking mgt including donning/doffing, wear schedule, and positioning. Additional ADL Goal #2: Pt will independently instruct family in polar care mgt including donning/doffing, wear schedule, and positioning.  OT Frequency: Min 2X/week   Barriers to D/C:            Co-evaluation              AM-PAC OT "6 Clicks" Daily Activity     Outcome Measure Help from another person eating meals?: None Help from another person taking care of personal grooming?: None Help from another person toileting, which includes using toliet, bedpan, or urinal?: A Little Help from another person bathing (including washing, rinsing, drying)?: A Lot Help from another person to put on and taking off regular upper body clothing?: None Help from another person to put on and taking off regular lower body clothing?: A Lot 6 Click Score: 19   End of Session    Activity Tolerance: Patient tolerated treatment well Patient left: in chair;with call bell/phone within reach;with chair alarm set;with family/visitor present;Other (comment);with SCD's reapplied(polar care in place, rolled towel under L ankle)  OT Visit Diagnosis: Other abnormalities of gait and mobility (R26.89);Repeated falls (R29.6);Muscle weakness (generalized) (M62.81);Pain Pain - Right/Left: Left Pain - part of body: Knee  Time: 1020-1045 OT Time Calculation (min): 25 min Charges:  OT General Charges $OT Visit: 1 Visit OT Evaluation $OT Eval Low Complexity: 1 Low OT Treatments $Self Care/Home Management : 8-22 mins  Jeni Salles, MPH, MS, OTR/L ascom [phone] 09/22/18, 11:00 AM

## 2018-09-22 NOTE — Evaluation (Signed)
Physical Therapy Evaluation Patient Details Name: [name] MRN: [id number] DOB: 1955-08-27 Today's Date: 09/22/2018   History of Present Illness  admitted for acute hospitalization status post L TKR (09/21/18), WBAT.  Clinical Impression  Upon evaluation, patient alert and oriented; follows simple commands throughout session. Intermittent closes eyes throughout session; frequent cuing for eyes open and full active participation with all mobility tasks.  L LE generally guarded, with strength (3-/5) and ROM (11-74 degrees), limited due to pain.  Able to complete SLR (limited height) with gentle assist, mod encouragement from therapist; fair/good knee stability in closed-chain, WBing position.   Currently requiring close sup for bed mobility; min/mod assist for sit/stand, basic transfers and short-distance gait (bed/chair, 5') with RW.  Demonstrates 3-point, step to gait pattern with decreased L LE stance time/loading phases; very quick R LE stepping with heavy WBing bilat UEs; fair/good L knee control in loading.  Additional distance limited by pain; RN informed/aware and to administer meds as appropriate. Would benefit from skilled PT to address above deficits and promote optimal return to PLOF ;recommend transition to STR upon discharge from acute hospitalization.  Will update as patient progresses.      Follow Up Recommendations Home health PT    Equipment Recommendations  Rolling walker with 5" wheels;3in1 (PT)    Recommendations for Other Services       Precautions / Restrictions Precautions Precautions: Fall Restrictions Weight Bearing Restrictions: Yes LLE Weight Bearing: Weight bearing as tolerated      Mobility  Bed Mobility Overal bed mobility: Needs Assistance Bed Mobility: Supine to Sit     Supine to sit: Supervision        Transfers Overall transfer level: Needs assistance Equipment used: Rolling walker (2 wheeled) Transfers: Sit to/from Stand Sit to Stand:  Min assist;Mod assist         General transfer comment: cuing for hand/foot placement; L LE anterior to BOS for pain control, very limited active use/WBing L LE  Ambulation/Gait Ambulation/Gait assistance: Min assist Gait Distance (Feet): 5 Feet Assistive device: Rolling walker (2 wheeled)       General Gait Details: 3-point, step to gait pattern with decreased L LE stance time/loading phases; very quick R LE stepping with heavy WBing bilat UEs.  Constant cuing for eyes open throughout transfer  Stairs            Wheelchair Mobility    Modified Rankin (Stroke Patients Only)       Balance Overall balance assessment: Needs assistance Sitting-balance support: No upper extremity supported;Feet supported Sitting balance-Leahy Scale: Good     Standing balance support: Bilateral upper extremity supported                                 Pertinent Vitals/Pain Pain Assessment: Faces Faces Pain Scale: Hurts little more Pain Location: L knee Pain Descriptors / Indicators: Grimacing;Guarding;Aching Pain Intervention(s): Limited activity within patient's tolerance;Monitored during session;Repositioned;Premedicated before session    Home Living Family/patient expects to be discharged to:: Private residence Living Arrangements: Alone Available Help at Discharge: Family(daughter planning to stay with patient at discharge) Type of Home: Mobile home Home Access: Stairs to enter Entrance Stairs-Rails: Psychiatric nurse of Steps: 5 Home Layout: One level Home Equipment: Cane - quad      Prior Function Level of Independence: Independent with assistive device(s)         Comments: Mod indep with [facility] for ADLs, household  mobilization; family assists with household chores, driving and community errands (due to worsening L knee pain).  Endorses at least 1-2 falls within recent weeks.     Hand Dominance        Extremity/Trunk Assessment    Upper Extremity Assessment Upper Extremity Assessment: Overall WFL for tasks assessed    Lower Extremity Assessment Lower Extremity Assessment: (L knee grossly 3-/5, limited by pain; otherwise, bilat LEs at least 4/5 throughout.  Full sensation returned/intact.)       Communication   Communication: No difficulties  Cognition Arousal/Alertness: Awake/alert Behavior During Therapy: WFL for tasks assessed/performed Overall Cognitive Status: Within Functional Limits for tasks assessed                                        General Comments      Exercises Total Joint Exercises Goniometric ROM: L knee: 11-74 degrees Other Exercises Other Exercises: Supine LE therex, 1x10, AROM for muscular strength/endurance with functional activities: ankle pumps, quad sets, heel slides, SLR.  Act assist initially for L SLR; very guarded due to pain.   Assessment/Plan    PT Assessment Patient needs continued PT services  PT Problem List Decreased strength;Decreased range of motion;Decreased activity tolerance;Decreased balance;Decreased mobility;Decreased knowledge of use of DME;Decreased safety awareness;Decreased knowledge of precautions;Pain       PT Treatment Interventions DME instruction;Gait training;Stair training;Functional mobility training;Therapeutic activities;Therapeutic exercise;Balance training;Patient/family education    PT Goals (Current goals can be found in the Care Plan section)  Acute Rehab PT Goals Patient Stated Goal: to return home PT Goal Formulation: With patient Time For Goal Achievement: 10/06/18 Potential to Achieve Goals: Good    Frequency BID   Barriers to discharge        Co-evaluation               AM-PAC PT "6 Clicks" Mobility  Outcome Measure Help needed turning from your back to your side while in a flat bed without using bedrails?: A Little Help needed moving from lying on your back to sitting on the side of a flat bed without  using bedrails?: A Little Help needed moving to and from a bed to a chair (including a wheelchair)?: A Little Help needed standing up from a chair using your arms (e.g., wheelchair or bedside chair)?: A Little Help needed to walk in hospital room?: A Little Help needed climbing 3-5 steps with a railing? : A Lot 6 Click Score: 17    End of Session Equipment Utilized During Treatment: Gait belt Activity Tolerance: Patient tolerated treatment well Patient left: in chair;with call bell/phone within reach;with chair alarm set Nurse Communication: Mobility status PT Visit Diagnosis: Muscle weakness (generalized) (M62.81);Difficulty in walking, not elsewhere classified (R26.2);Pain Pain - Right/Left: Left Pain - part of body: Knee    Time: 3832-9191 PT Time Calculation (min) (ACUTE ONLY): 35 min   Charges:   PT Evaluation $PT Eval Moderate Complexity: 1 Mod PT Treatments $Therapeutic Exercise: 8-22 mins        Tyquez Hollibaugh H. Owens Shark, PT, DPT, NCS 09/22/18, 9:11 AM [phone]   Evaluation/treatment session was lead and completed by Dorothy Spark, SPT; directly observed, guided and documented by Tera Helper, PT.

## 2018-09-22 NOTE — Progress Notes (Signed)
Pt is still drowsy but awakens to voice and is able to have a short conversation with nurse prior to falling asleep. Will continue to monitor.

## 2018-09-22 NOTE — Progress Notes (Addendum)
Physical Therapy Treatment Patient Details Name: [name] MRN: [id number] DOB: Apr 18, 1956 Today's Date: 09/22/2018    History of Present Illness 63 yo female admitted for acute hospitalization status post L TKR (09/21/18), WBAT.    PT Comments    Pt reporting 6/10 L knee pain beginning of session and pain increased to 7/10 end of session resting in chair.  Able to progress pt to ambulating 20 feet with RW CGA to min assist x1 (chair follow d/t pt initially reporting mild dizziness but improved symptoms reported as pt was ambulating).  Overall limited d/t reports of L knee pain.  Will continue to progress pt with strengthening, knee ROM, and progressive ambulation per pt tolerance.    Follow Up Recommendations  Home health PT     Equipment Recommendations  Rolling walker with 5" wheels;3in1 (PT)    Recommendations for Other Services       Precautions / Restrictions Precautions Precautions: Fall Restrictions Weight Bearing Restrictions: Yes LLE Weight Bearing: Weight bearing as tolerated    Mobility  Bed Mobility Overal bed mobility: Needs Assistance Bed Mobility: Supine to Sit     Supine to sit: Supervision     General bed mobility comments: increased effort to perform on own; HOB elevated  Transfers Overall transfer level: Needs assistance Equipment used: Rolling walker (2 wheeled) Transfers: Sit to/from Stand Sit to Stand: Min assist;Mod assist         General transfer comment: assist to initiate stand; vc's for UE/LE placement and technique  Ambulation/Gait Ambulation/Gait assistance: Min guard;Min assist Gait Distance (Feet): 20 Feet Assistive device: Rolling walker (2 wheeled)   Gait velocity: decreased   General Gait Details: partial step through gait pattern; vc's to stay closer to RW; antalgic; decreased stance time L LE   Stairs             Wheelchair Mobility    Modified Rankin (Stroke Patients Only)       Balance Overall  balance assessment: Needs assistance Sitting-balance support: No upper extremity supported;Feet supported Sitting balance-Leahy Scale: Good Sitting balance - Comments: steady sitting reaching within BOS   Standing balance support: Single extremity supported Standing balance-Leahy Scale: Poor Standing balance comment: pt requiring at least single UE support for static standing balance                            Cognition Arousal/Alertness: Awake/alert Behavior During Therapy: WFL for tasks assessed/performed Overall Cognitive Status: Within Functional Limits for tasks assessed                                        Exercises Total Joint Exercises Ankle Circles/Pumps: AROM;Strengthening;Both;10 reps;Supine Quad Sets: AROM;Strengthening;Both;10 reps;Supine Knee Flexion: AROM;Strengthening;Left;Seated(18 reps (towel under pt's foot to allow for sliding on floor))    General Comments  Pt agreeable to PT session.      Pertinent Vitals/Pain Pain Assessment: 0-10 Pain Score: 7  Pain Location: L knee Pain Descriptors / Indicators: Grimacing;Guarding;Aching Pain Intervention(s): Limited activity within patient's tolerance;Monitored during session;Premedicated before session;Repositioned;Other (comment)(polar care applied and activated)  Vitals (HR and O2 on room air) stable and WFL throughout treatment session.     Home Living                      Prior Function  PT Goals (current goals can now be found in the care plan section) Acute Rehab PT Goals Patient Stated Goal: to return home PT Goal Formulation: With patient Time For Goal Achievement: 10/06/18 Potential to Achieve Goals: Good Progress towards PT goals: Progressing toward goals    Frequency    BID      PT Plan Current plan remains appropriate    Co-evaluation              AM-PAC PT "6 Clicks" Mobility   Outcome Measure  Help needed turning from your  back to your side while in a flat bed without using bedrails?: A Little Help needed moving from lying on your back to sitting on the side of a flat bed without using bedrails?: A Little Help needed moving to and from a bed to a chair (including a wheelchair)?: A Little Help needed standing up from a chair using your arms (e.g., wheelchair or bedside chair)?: A Little Help needed to walk in hospital room?: A Little Help needed climbing 3-5 steps with a railing? : A Lot 6 Click Score: 17    End of Session Equipment Utilized During Treatment: Gait belt Activity Tolerance: Patient limited by pain Patient left: in chair;with call bell/phone within reach;with chair alarm set;with SCD's reapplied;Other (comment)(L heel elevated via towel roll; polar care in place and activated) Nurse Communication: Mobility status;Precautions;Other (comment)(pt's pain status) PT Visit Diagnosis: Muscle weakness (generalized) (M62.81);Difficulty in walking, not elsewhere classified (R26.2);Pain Pain - Right/Left: Left Pain - part of body: Knee     Time: 8115-7262 PT Time Calculation (min) (ACUTE ONLY): 30 min  Charges:  $Therapeutic Exercise: 8-22 mins $Therapeutic Activity: 8-22 mins                    Leitha Bleak, PT 09/22/18, 4:16 PM [phone]  Treatment session was lead and completed by Dorothy Spark, SPT; directly observed, guided and documented by Leitha Bleak, PT.

## 2018-09-22 NOTE — Progress Notes (Signed)
   Subjective: 1 Day Post-Op Procedure(s) (LRB): TOTAL KNEE ARTHROPLASTY-LEFT NEEDS RNFA (Left) Patient reports pain as mild.   Patient is well, and has had no acute complaints or problems Denies any CP, SOB, ABD pain. We will continue therapy today.   Objective: Vital signs in last 24 hours: Temp:  [97.5 F (36.4 C)-99.7 F (37.6 C)] 97.5 F (36.4 C) (03/04 0742) Pulse Rate:  [75-98] 80 (03/04 0742) Resp:  [14-25] 18 (03/04 0742) BP: (107-132)/(57-72) 123/66 (03/04 0742) SpO2:  [93 %-100 %] 99 % (03/04 0742) Weight:  [101.5 kg] 101.5 kg (03/03 1339)  Intake/Output from previous day: 03/03 0701 - 03/04 0700 In: 1494 [P.O.:240; I.V.:1104; IV Piggyback:150] Out: 9373 [Urine:1625; Blood:50] Intake/Output this shift: No intake/output data recorded.  Recent Labs    09/21/18 1401 09/22/18 0513  HGB 11.4* 10.2*   Recent Labs    09/21/18 1401 09/22/18 0513  WBC 8.2 10.0  RBC 3.59* 3.21*  HCT 34.3* 30.6*  PLT 125* 111*   Recent Labs    09/21/18 1401 09/22/18 0513  NA  --  139  K  --  3.7  CL  --  111  CO2  --  21*  BUN  --  22  CREATININE 0.52 0.48  GLUCOSE  --  169*  CALCIUM  --  8.1*   No results for input(s): LABPT, INR in the last 72 hours.  EXAM General - Patient is Alert, Appropriate and Oriented Extremity - Neurovascular intact Sensation intact distally Intact pulses distally Dorsiflexion/Plantar flexion intact No cellulitis present Compartment soft Dressing - dressing C/D/I and no drainage Motor Function - intact, moving foot and toes well on exam.   Past Medical History:  Diagnosis Date  . Allergy   . Arthritis   . COPD (chronic obstructive pulmonary disease) ([place])   . Diabetes mellitus, type 2 ([place])   . Excessive daytime sleepiness 11/12/2017  . GERD (gastroesophageal reflux disease)   . Goiter   . History of kidney stones   . Hypertension   . Kidney stone   . Memory loss   . 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  Osteoporosis    in lower back per pt   . Plantar fasciitis   . Snoring 08/28/2017  . Stroke [facility])    residual left sided weakness  . Tremors of nervous system   . Vertigo     Assessment/Plan:   1 Day Post-Op Procedure(s) (LRB): TOTAL KNEE ARTHROPLASTY-LEFT NEEDS RNFA (Left) Active Problems:   S/P TKR (total knee replacement) using cement, left  Estimated body mass index is 37.24 kg/m as calculated from the following:   Height as of this encounter: 5\' 5"  (1.651 m).   Weight as of this encounter: 101.5 kg. Advance diet Up with therapy  Needs BM Recheck labs in the am VSS Labs stable CM to assist with discharge  DVT Prophylaxis - Lovenox, TED hose and SCDs Weight-Bearing as tolerated to left leg   T. Rachelle Hora, PA-C [employer] 09/22/2018, 10:49 AM

## 2018-09-22 NOTE — Anesthesia Postprocedure Evaluation (Signed)
Anesthesia Post Note  Patient: [name]  Procedure(s) Performed: TOTAL KNEE ARTHROPLASTY-LEFT NEEDS RNFA (Left Knee)  Anesthesia Type: Spinal     Last Vitals:  Vitals:   09/22/18 0337 09/22/18 0532  BP: 114/65 (!) 108/57  Pulse: 82 79  Resp: (!) 22 20  Temp: 36.6 C 36.5 C  SpO2: 95% 99%    Last Pain:  Vitals:   09/22/18 0532  TempSrc: Oral  PainSc:                  Thomas Hoff

## 2018-09-22 NOTE — Progress Notes (Signed)
Inpatient Diabetes Program Recommendations  AACE/ADA: New Consensus Statement on Inpatient Glycemic Control (2015)  Target Ranges:  Prepandial:   less than 140 mg/dL      Peak postprandial:   less than 180 mg/dL (1-2 hours)      Critically ill patients:  140 - 180 mg/dL   Results for [name], [name] (MRN [id number]) as of 09/22/2018 13:58  Ref. Range 09/21/2018 12:01 09/21/2018 16:36 09/21/2018 20:59  Glucose-Capillary Latest Ref Range: 70 - 99 mg/dL 136 (H) 300 (H) 278 (H)   Results for [name], [name] (MRN [id number]) as of 09/22/2018 13:58  Ref. Range 09/22/2018 07:43 09/22/2018 11:36  Glucose-Capillary Latest Ref Range: 70 - 99 mg/dL 122 (H) 206 (H)   Review of Glycemic Control  Diabetes history: DM 2 Outpatient Diabetes medications: Metformin 1000 mg BID, Januvia 100 mg Daily at supper Current orders for Inpatient glycemic control: Metformin 1000 mg BID, Tradjenta 5 mg Daily, Novolog 0-15 units tid  Inpatient Diabetes Program Recommendations:    S/p total knee replacement Received Decadron 6 mg yesterday during surgery  Glucose trends increase after meals into the 200 range. Consider Novolog 3 units tid meal coverage in addition to correction scale if patient consumes at least 50% of meals.  Thanks,  Tama Headings RN, MSN, BC-ADM Inpatient Diabetes Coordinator Team Pager [phone] (8a-5p)

## 2018-09-22 NOTE — Progress Notes (Signed)
Pt lethargic and drowsy at 1930. VSS. Will arouse to voice but falls back to sleep. Unable to answer assessment questions. Will hold all PO meds as pt is too lethargic and may be unsafe to swallow. Will continue to monitor.

## 2018-09-23 LAB — GLUCOSE, CAPILLARY
GLUCOSE-CAPILLARY: 198 mg/dL — AB (ref 70–99)
GLUCOSE-CAPILLARY: 152 mg/dL — AB (ref 70–99)
Glucose-Capillary: 111 mg/dL — ABNORMAL HIGH (ref 70–99)
Glucose-Capillary: 144 mg/dL — ABNORMAL HIGH (ref 70–99)

## 2018-09-23 LAB — BASIC METABOLIC PANEL
Anion gap: 6 (ref 5–15)
BUN: 17 mg/dL (ref 8–23)
CO2: 27 mmol/L (ref 22–32)
Calcium: 8.5 mg/dL — ABNORMAL LOW (ref 8.9–10.3)
Chloride: 108 mmol/L (ref 98–111)
Creatinine, Ser: 0.5 mg/dL (ref 0.44–1.00)
GFR calc non Af Amer: >60 mL/min (ref >60)
Glucose, Bld: 145 mg/dL — ABNORMAL HIGH (ref 70–99)
Potassium: 3.7 mmol/L (ref 3.5–5.1)
SODIUM: 141 mmol/L (ref 135–145)

## 2018-09-23 LAB — CBC
HCT: 32.7 % — ABNORMAL LOW (ref 36.0–46.0)
Hemoglobin: 10.6 g/dL — ABNORMAL LOW (ref 12.0–15.0)
MCH: 31.2 pg (ref 26.0–34.0)
MCHC: 32.4 g/dL (ref 30.0–36.0)
MCV: 96.2 fL (ref 80.0–100.0)
NRBC: 0 % (ref 0.0–0.2)
Platelets: 109 10*3/uL — ABNORMAL LOW (ref 150–400)
RBC: 3.4 MIL/uL — ABNORMAL LOW (ref 3.87–5.11)
RDW: 16.1 % — ABNORMAL HIGH (ref 11.5–15.5)
WBC: 8.2 10*3/uL (ref 4.0–10.5)

## 2018-09-23 MED ORDER — ENOXAPARIN SODIUM 40 MG/0.4ML ~~LOC~~ SOLN: 40.0000 mg | SUBCUTANEOUS | 0 refills | Status: DC

## 2018-09-23 MED ORDER — OXYCODONE HCL 5 MG PO TABS: 5.0000 mg | ORAL_TABLET | ORAL | 0 refills | Status: DC | PRN

## 2018-09-23 MED ORDER — ACETAMINOPHEN 325 MG PO TABS: 325.0000 mg | ORAL_TABLET | Freq: Four times a day (QID) | ORAL | Status: DC | PRN

## 2018-09-23 MED ORDER — DOCUSATE SODIUM 100 MG PO CAPS: 100.0000 mg | ORAL_CAPSULE | Freq: Two times a day (BID) | ORAL | 0 refills | Status: DC

## 2018-09-23 MED ORDER — METHOCARBAMOL 500 MG PO TABS: 500.0000 mg | ORAL_TABLET | Freq: Four times a day (QID) | ORAL | 0 refills | Status: DC | PRN

## 2018-09-23 NOTE — Care Management (Signed)
Sent secure inbox to CMA requesting to check benefit and price for Lovenox

## 2018-09-23 NOTE — Progress Notes (Signed)
Occupational Therapy Treatment Patient Details Name: [name] MRN: [id number] DOB: 1956/06/09 Today's Date: 09/23/2018    History of present illness 63 yo female admitted for acute hospitalization status post L TKR (09/21/18), WBAT.   OT comments  Pt seen for OT tx this date focused on recall and carryover of learned instruction from previous date. Pt demonstrated <25% recall of instruction. Pt unable to recall MD instructions to stay hydrated and have BMs at home, just 15 minutes after MD discussed with her. Family present for session to support recall and carryover. Pt demonstrating impairments in cognition - safety awareness, short term memory, and requires additional supervision/cues/assist for ADL for safety. Will continue to progress. HHOT remains appropriate.   Follow Up Recommendations  Home health OT    Equipment Recommendations  Tub/shower bench;3 in 1 bedside commode    Recommendations for Other Services      Precautions / Restrictions Precautions Precautions: Fall Restrictions Weight Bearing Restrictions: Yes LLE Weight Bearing: Weight bearing as tolerated       Mobility Bed Mobility               General bed mobility comments: deferred   Transfers                 General transfer comment: deferred    Balance                                           ADL either performed or assessed with clinical judgement   ADL Overall ADL's : Needs assistance/impaired                                       General ADL Comments: Min-Mod A for LB ADL from sit to stand position, Min A for toilet transfer to [place] [facility]/History: Wears glasses Wears Glasses: Reading only Patient Visual Report: No change from baseline     Perception     Praxis      Cognition Arousal/Alertness: Awake/alert Behavior During Therapy: Flat affect Overall Cognitive Status: Difficult to assess                                  General Comments: pt demonstrating decreased delayed recall of instruction provided <20 minutes ago as well as <25% recall of instruction in AE/DME from OT evaluation. Decr safety awareness.        Exercises Other Exercises Other Exercises: pt/daughter/family instructed in polar care mgt, compression stocking mgt, self care skills, DME/AE for bathing/dressing/toileting, falls prevention, and pet care considerations to maximize recall and carryover   Shoulder Instructions       General Comments      Pertinent Vitals/ Pain       Pain Assessment: 0-10 Pain Score: 7  Pain Location: L knee Pain Descriptors / Indicators: Aching;Tender;Sore Pain Intervention(s): Limited activity within patient's tolerance;Monitored during session;Repositioned;Ice applied  Home Living                                          Prior Functioning/Environment  Frequency  Min 2X/week        Progress Toward Goals  OT Goals(current goals can now be found in the care plan section)  Progress towards OT goals: OT to reassess next treatment  Acute Rehab OT Goals Patient Stated Goal: to return home OT Goal Formulation: With patient/family Time For Goal Achievement: 10/06/18 Potential to Achieve Goals: Good  Plan Discharge plan remains appropriate;Frequency remains appropriate    Co-evaluation                 AM-PAC OT "6 Clicks" Daily Activity     Outcome Measure   Help from another person eating meals?: None Help from another person taking care of personal grooming?: None Help from another person toileting, which includes using toliet, bedpan, or urinal?: A Little Help from another person bathing (including washing, rinsing, drying)?: A Lot Help from another person to put on and taking off regular upper body clothing?: None Help from another person to put on and taking off regular lower body clothing?: A Lot 6 Click Score: 19     End of Session    OT Visit Diagnosis: Other abnormalities of gait and mobility (R26.89);Repeated falls (R29.6);Muscle weakness (generalized) (M62.81);Pain Pain - Right/Left: Left Pain - part of body: Knee   Activity Tolerance Patient tolerated treatment well   Patient Left in bed;with call bell/phone within reach;with bed alarm set;with family/visitor present;with SCD's reapplied;Other (comment)(polar care in place)   Nurse Communication          Time: [phone] OT Time Calculation (min): 16 min  Charges: OT General Charges $OT Visit: 1 Visit OT Treatments $Self Care/Home Management : 8-22 mins  Jeni Salles, MPH, MS, OTR/L ascom [phone] 09/23/18, 3:58 PM

## 2018-09-23 NOTE — Discharge Summary (Addendum)
Physician Discharge Summary  [name] MRN: [id number] DOB/AGE: 03-02-1956 63 y.o.  Admit date: 09/21/2018 Discharge date: 09/25/2018 Admission Diagnoses:  PRIMARY OSTEOARTHRITIS OF LEFT KNEE   Discharge Diagnoses: Patient Active Problem List   Diagnosis Date Noted  . S/P TKR (total knee replacement) using cement, left 09/21/2018  . Onychomycosis of toenail 05/19/2018  . Healthcare maintenance 04/16/2018  . Degenerative joint disease of knee, left 04/16/2018  . Shortness of breath 12/25/2017  . Excessive daytime sleepiness 11/12/2017  . Snoring 08/28/2017  . Mixed Alzheimer's and vascular dementia ([place]) 08/28/2017  . Abnormal TSH 08/28/2017  . Memory loss 08/10/2017  . Tinea pedis 08/10/2017  . Urge incontinence 07/25/2016  . Impaired functional mobility, balance, and endurance 07/24/2016  . Falls 05/23/2016  . Warts 05/23/2016  . Chronic dental pain 02/22/2016  . 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 Disorder of sacroiliac joint 05/15/2015  . Dyslipidemia associated with type 2 diabetes mellitus ([place]) 04/26/2015  . Benign paroxysmal positional vertigo 01/24/2015  . Diabetes mellitus type 2, uncomplicated ([place]) 32/20/2542  . Vitamin D deficiency 07/11/2013  . Mood disorder ([place]) 07/11/2013  . Osteopenia 11/04/2012  . Heel pain 08/14/2011  . Atony of bladder 06/10/2010  . Uterovaginal prolapse, incomplete 06/10/2010  . VAGINITIS, ATROPHIC 05/17/2010  . 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 Chronic pain syndrome 04/26/2010  . Morbid obesity ([place]) 09/17/2006  . GASTROESOPHAGEAL REFLUX, NO ESOPHAGITIS 09/17/2006    Past Medical History:  Diagnosis Date  . Allergy   . Arthritis   . COPD (chronic obstructive pulmonary disease) ([place])   . Diabetes mellitus, type 2 ([place])   . Excessive daytime sleepiness 11/12/2017  . GERD (gastroesophageal reflux disease)   . Goiter   . History of kidney stones    . Hypertension   . Kidney stone   . Memory loss   . Mixed Alzheimer's and vascular dementia ([place]) 08/28/2017  . Osteoporosis    in lower back per pt   . Plantar fasciitis   . Snoring 08/28/2017  . Stroke [facility])    residual left sided weakness  . Tremors of nervous system   . Vertigo      Transfusion: none   Consultants (if any):   Discharged Condition: Improved  Hospital Course: JAMI BOGDANSKI is an 63 y.o. female who was admitted 09/21/2018 with a diagnosis of left knee osteoarthritis and went to the operating room on 09/21/2018 and underwent the above named procedures.    Surgeries: Procedure(s): TOTAL KNEE ARTHROPLASTY-LEFT NEEDS RNFA on 09/21/2018 Patient tolerated the surgery well. Taken to PACU where she was stabilized and then transferred to the orthopedic floor.  Started on Lovenox 30 mg q 12 hrs. Foot pumps applied bilaterally at 80 mm. Heels elevated on bed with rolled towels. No evidence of DVT. Negative [place]. Physical therapy started on day #1 for gait training and transfer. OT started day #1 for ADL and assisted devices.  Patient's foley was d/c on day #1. Patient's IV was d/c on day #2.  On post op day #3 patient became very sedated with worsening dementia. Narcotics and psychotropic meds held. On post op day 4, mental status back to baseline. Patient stable and ready for discharge to home with HHPT.  Implants: Medacta GMK Sphere 3+ left femur, 3 tibia short stem, 2 patella, 11 mm insert all components cemented  She was given perioperative antibiotics:  Anti-infectives (From admission, onward)   Start  Dose/Rate Route Frequency Ordered Stop   09/21/18 1600  ceFAZolin (ANCEF) IVPB 2g/100 mL premix     2 g 200 mL/hr over 30 Minutes Intravenous Every 6 hours 09/21/18 1329 09/21/18 2215   09/21/18 1127  gentamicin (GARAMYCIN) 80 mg in sodium chloride 0.9 % 500 mL irrigation  Status:  Discontinued       As needed 09/21/18 1128 09/21/18 1156   09/21/18 0841  ceFAZolin  (ANCEF) 2-4 GM/100ML-% IVPB    Note to Pharmacy:  Garfield Cornea   : cabinet override      09/21/18 0841 09/21/18 1010   09/20/18 2200  ceFAZolin (ANCEF) IVPB 2g/100 mL premix     2 g 200 mL/hr over 30 Minutes Intravenous  Once 09/20/18 2155 09/21/18 1015    .  She was given sequential compression devices, early ambulation, and Lovenox TEDs for DVT prophylaxis.  She benefited maximally from the hospital stay and there were no complications.    Recent vital signs:  Vitals:   09/24/18 0944 09/24/18 2339  BP: 126/66 125/65  Pulse: 86 98  Resp: 18 (!) 24  Temp: 98.1 F (36.7 C) 98.5 F (36.9 C)  SpO2: 99% 99%    Recent laboratory studies:  Lab Results  Component Value Date   HGB 10.8 (L) 09/25/2018   HGB 10.6 (L) 09/23/2018   HGB 10.2 (L) 09/22/2018   Lab Results  Component Value Date   WBC 8.5 09/25/2018   PLT 133 (L) 09/25/2018   Lab Results  Component Value Date   INR 1.17 09/08/2018   Lab Results  Component Value Date   NA 139 09/25/2018   K 3.7 09/25/2018   CL 105 09/25/2018   CO2 24 09/25/2018   BUN 17 09/25/2018   CREATININE 0.44 09/25/2018   GLUCOSE 147 (H) 09/25/2018    Discharge Medications:   Allergies as of 09/25/2018      Reactions   Hydrocodone-acetaminophen Nausea And Vomiting      Medication List    STOP taking these medications   aspirin EC 81 MG tablet   meloxicam 15 MG tablet Commonly known as:  MOBIC     TAKE these medications   Accu-Chek Aviva Plus test strip Generic drug:  glucose blood USE  TO CHECK BLOOD SUGAR THREE TIMES DAILY   Accu-Chek Softclix Lancets lancets USE  TO CHECK BLOOD SUGAR THREE TIMES DAILY   acetaminophen 325 MG tablet Commonly known as:  TYLENOL Take 1-2 tablets (325-650 mg total) by mouth every 6 (six) hours as needed for mild pain (pain score 1-3 or temp > 100.5).   albuterol (2.5 MG/3ML) 0.083% nebulizer solution Commonly known as:  PROVENTIL Take 3 mLs (2.5 mg total) by nebulization every 4  (four) hours as needed for wheezing.   albuterol 108 (90 Base) MCG/ACT inhaler Commonly known as:  PROVENTIL HFA;VENTOLIN HFA Inhale 1-2 puffs into the lungs every 6 (six) hours as needed for wheezing.   alendronate 70 MG tablet Commonly known as:  FOSAMAX TAKE 1 TABLET EVERY 7 DAYS What changed:  See the new instructions.   atorvastatin 10 MG tablet Commonly known as:  LIPITOR Take 1 tablet (10 mg total) by mouth daily.   B-D SINGLE USE SWABS REGULAR Pads USE  TO  CLEAN  SKIN  BEFORE CHECKING BLOOD SUGAR   baclofen 20 MG tablet Commonly known as:  LIORESAL Take 20 mg by mouth 3 (three) times daily.   beclomethasone 40 MCG/ACT inhaler Commonly known as:  Technical sales engineer  2 puffs into the lungs 2 (two) times daily.   CALCIUM CITRATE-VITAMIN D PO Take 1 tablet by mouth 2 (two) times daily.   clotrimazole 1 % cream Commonly known as:  LOTRIMIN Apply 1 application topically 2 (two) times daily.   [employer] SPECTRAVITE WOMENS SENIOR PO Take 1 tablet by mouth daily.   docusate sodium 100 MG capsule Commonly known as:  COLACE Take 1 capsule (100 mg total) by mouth 2 (two) times daily. What changed:  You were already taking a medication with the same name, and this prescription was added. Make sure you understand how and when to take each.   docusate sodium 100 MG capsule Commonly known as:  COLACE Take 100 mg by mouth daily. What changed:  Another medication with the same name was added. Make sure you understand how and when to take each.   DULoxetine 60 MG capsule Commonly known as:  CYMBALTA Take 1 capsule (60 mg total) by mouth daily.   enoxaparin 40 MG/0.4ML injection Commonly known as:  Lovenox Inject 0.4 mLs (40 mg total) into the skin daily for 14 days.   gabapentin 800 MG tablet Commonly known as:  NEURONTIN Take 1 tablet (800 mg total) by mouth 3 (three) times daily.   Januvia 100 MG tablet Generic drug:  sitaGLIPtin TAKE 1 TABLET (100 MG TOTAL) BY MOUTH  DAILY. What changed:  See the new instructions.   lisinopril 10 MG tablet Commonly known as:  PRINIVIL,ZESTRIL Take 10 mg by mouth daily.   meclizine 12.5 MG tablet Commonly known as:  ANTIVERT TAKE 1 TABLET 2 TIMES DAILY AS NEEDED FOR DIZZINESS. What changed:    how much to take  how to take this  when to take this  additional instructions   METAMUCIL PO Take 1 scoop by mouth daily.   metFORMIN 1000 MG tablet Commonly known as:  GLUCOPHAGE Take 1 tablet (1,000 mg total) by mouth 2 (two) times daily with a meal.   omeprazole 20 MG capsule Commonly known as:  PRILOSEC TAKE 1 CAPSULE EVERY DAY What changed:  when to take this   ondansetron 4 MG tablet Commonly known as:  ZOFRAN TAKE 1 TABLET EVERY 8 HOURS AS NEEDED  FOR NAUSEA  AND  VOMITING What changed:  See the new instructions.   oxyCODONE 5 MG immediate release tablet Commonly known as:  Oxy IR/ROXICODONE Take 0.5-1 tablets (2.5-5 mg total) by mouth every 4 (four) hours as needed for moderate pain (pain score 4-6).            Durable Medical Equipment  (From admission, onward)         Start     Ordered   09/21/18 1329  DME Walker rolling  Once    Question:  Patient needs a walker to treat with the following condition  Answer:  S/P TKR (total knee replacement) using cement, left   09/21/18 1329   09/21/18 1329  DME 3 n 1  Once     09/21/18 1329   09/21/18 1329  DME Bedside commode  Once    Question:  Patient needs a bedside commode to treat with the following condition  Answer:  S/P TKR (total knee replacement) using cement, left   09/21/18 1329          Diagnostic Studies: Dg Knee 1-2 Views Left  Result Date: 09/21/2018 CLINICAL DATA:  Postop left knee arthroplasty. EXAM: LEFT KNEE - 1-2 VIEW COMPARISON:  03/25/2018 FINDINGS: Left knee arthropathic components appear well  seated well aligned. No acute fracture or evidence of an operative complication. IMPRESSION: Well-positioned total left knee  arthroplasty. Electronically Signed   By: Lajean Manes M.D.   On: 09/21/2018 12:26   Ct Head Wo Contrast  Result Date: 09/24/2018 CLINICAL DATA:  63 y/o  F; acute toxic metabolic encephalopathy. EXAM: CT HEAD WITHOUT CONTRAST TECHNIQUE: Contiguous axial images were obtained from the base of the skull through the vertex without intravenous contrast. COMPARISON:  02/22/2015 MRI head. 10/21/2014 CT head. FINDINGS: Brain: No evidence of acute infarction, hemorrhage, hydrocephalus, extra-axial collection or mass lesion/mass effect. Few nonspecific white matter hypodensities are compatible with mild chronic microvascular ischemic changes. Vascular: No hyperdense vessel or unexpected calcification. Skull: Normal. Negative for fracture or focal lesion. Sinuses/Orbits: Left frontal, anterior ethmoid, and maxillary sinus opacification which. Partial opacification of right-sided mastoid air cells. Additional visible paranasal sinuses and the left mastoid air cells are normally aerated. Bilateral intra-ocular lens replacement. Other: None. IMPRESSION: 1. No acute intracranial abnormality identified. 2. Mild chronic microvascular ischemic changes of the brain. 3. Left frontal, anterior ethmoid, and maxillary sinus opacification compatible with left middle meatus obstruction. Direct visualization recommended. Electronically Signed   By: Kristine Garbe M.D.   On: 09/24/2018 14:43   Ct Knee Left Wo Contrast  Result Date: 08/30/2018 CLINICAL DATA:  Preoperative planning study using the MY KNEE protocol. Chronic left knee pain. Patient for knee replacement EXAM: CT OF THE LEFT KNEE WITHOUT CONTRAST TECHNIQUE: Multidetector CT imaging of the left knee was performed according to the standard protocol. Multiplanar CT image reconstructions were also generated. Axial imaging only of the left hip and ankle was also performed. COMPARISON:  Plain films left knee 03/25/2018. FINDINGS: Bones/Joint/Cartilage Axial imaging of  the left hip demonstrates no acute abnormality. There is disease the symphysis pubis and left SI joint. No acute abnormality left ankle is identified. Degenerative change about the tarsometatarsal joints appears worst at the second and third. Calcifications in the plantar fascia are noted. The patient has osteoarthritis about all 3 compartments of the knee. Degenerative change is worst medially where there is bone-on-bone joint space narrowing. Several small loose bodies are identified in the joint. The patient has a moderately large joint effusion containing debris. Ligaments Suboptimally assessed by CT. Muscles and Tendons Intact and normal appearance. Soft tissues No acute or focal abnormality. IMPRESSION: Advanced left knee osteoarthritis is worst in the medial compartment. Joint effusion containing debris and loose bodies are noted. Electronically Signed   By: Inge Rise M.D.   On: 08/30/2018 15:26    Disposition:     Follow-up Information    Duanne Guess, PA-C On 10/06/2018.   Specialties:  Orthopedic Surgery, Emergency Medicine Why:  @ 2:15 pm Contact information: [place] [address] [phone]            Signed: Feliberto Gottron 09/25/2018, 8:18 AM

## 2018-09-23 NOTE — Progress Notes (Signed)
Clinical Social Worker (CSW) received SNF consult. PT is recommending home health. RN case manager aware of above. Please reconsult if future social work needs arise. CSW signing off.   Leora Platt, LCSW [phone] 

## 2018-09-23 NOTE — Progress Notes (Signed)
Physical Therapy Treatment Patient Details Name: [name] MRN: [id number] DOB: Oct 25, 1955 Today's Date: 09/23/2018    History of Present Illness 63 yo female admitted for acute hospitalization status post L TKR (09/21/18), WBAT.    PT Comments    Pt able to progress to ambulating 120 feet with RW CGA; pt steady although decreased cadence noted (distance performed per pt comfort); vc's given initially to stay closer to RW and increase UE support through RW to offweight L LE.  Pain 7/10 L knee beginning of session and end of session resting in chair (pt requesting pain medication: nurse notified).  L knee flexion AROM to 90 degrees.  Will continue to progress pt with strengthening, knee ROM, increasing ambulation distance, and trial stairs this afternoon per pt tolerance.    Follow Up Recommendations  Home health PT     Equipment Recommendations  Rolling walker with 5" wheels;3in1 (PT)    Recommendations for Other Services OT consult     Precautions / Restrictions Precautions Precautions: Fall Restrictions Weight Bearing Restrictions: Yes LLE Weight Bearing: Weight bearing as tolerated    Mobility  Bed Mobility Overal bed mobility: Needs Assistance Bed Mobility: Supine to Sit     Supine to sit: Supervision     General bed mobility comments: increased effort to perform on own; HOB mildly elevated  Transfers Overall transfer level: Needs assistance Equipment used: Rolling walker (2 wheeled) Transfers: Sit to/from Stand Sit to Stand: Min assist;Min guard         General transfer comment: min assist to initiate stand from bed on 3rd trial (pt attempted to stand on own x2 trials but unable with max cueing for technique); CGA to stand from recliner; vc's for UE/LE placement and overall technique  Ambulation/Gait Ambulation/Gait assistance: Min guard Gait Distance (Feet): 120 Feet Assistive device: Rolling walker (2 wheeled)   Gait velocity: decreased   General  Gait Details: partial step through gait pattern; initial vc's to stay closer to RW and increase UE support through RW to offweight L LE; antalgic; decreased stance time L LE   Stairs             Wheelchair Mobility    Modified Rankin (Stroke Patients Only)       Balance Overall balance assessment: Needs assistance Sitting-balance support: No upper extremity supported;Feet supported Sitting balance-Leahy Scale: Normal Sitting balance - Comments: steady sitting reaching outside BOS   Standing balance support: No upper extremity supported Standing balance-Leahy Scale: Fair Standing balance comment: steady static standing                            Cognition Arousal/Alertness: Awake/alert Behavior During Therapy: WFL for tasks assessed/performed Overall Cognitive Status: Within Functional Limits for tasks assessed                                        Exercises Total Joint Exercises Ankle Circles/Pumps: AROM;Strengthening;Both;10 reps;Supine Quad Sets: AROM;Strengthening;Both;10 reps;Supine Short Arc Quad: AAROM;Strengthening;Left;10 reps;Supine Heel Slides: AAROM;Strengthening;Left;10 reps;Supine Hip ABduction/ADduction: AAROM;Strengthening;Left;10 reps;Supine Straight Leg Raises: AAROM;Strengthening;Left;10 reps;Supine Goniometric ROM: L knee extension 6 degrees short of neutral semi-supine in bed; L knee flexion 90 degrees AROM sitting edge of recliner    General Comments   Nursing cleared pt for participation in physical therapy.  Pt agreeable to PT session.  Pt's daughter present part of session.  Delivered RW adjusted/fit to pt for home use.  Educated pt and pt's daughter on use of polar care d/t both having questions (both verbalizing good understanding after education).      Pertinent Vitals/Pain Pain Assessment: 0-10 Pain Score: 7  Pain Location: L knee Pain Descriptors / Indicators: Aching;Tender;Sore Pain Intervention(s):  Limited activity within patient's tolerance;Monitored during session;Repositioned;Patient requesting pain meds-RN notified;Other (comment)(polar care applied and activated)  Vitals (HR and O2 on room air) stable and WFL throughout treatment session.    Home Living                      Prior Function            PT Goals (current goals can now be found in the care plan section) Acute Rehab PT Goals Patient Stated Goal: to return home PT Goal Formulation: With patient Time For Goal Achievement: 10/06/18 Potential to Achieve Goals: Good Progress towards PT goals: Progressing toward goals    Frequency    BID      PT Plan Current plan remains appropriate    Co-evaluation              AM-PAC PT "6 Clicks" Mobility   Outcome Measure  Help needed turning from your back to your side while in a flat bed without using bedrails?: A Little Help needed moving from lying on your back to sitting on the side of a flat bed without using bedrails?: A Little Help needed moving to and from a bed to a chair (including a wheelchair)?: A Little Help needed standing up from a chair using your arms (e.g., wheelchair or bedside chair)?: A Little Help needed to walk in hospital room?: A Little Help needed climbing 3-5 steps with a railing? : A Little 6 Click Score: 18    End of Session Equipment Utilized During Treatment: Gait belt Activity Tolerance: Patient limited by pain Patient left: in chair;with call bell/phone within reach;with chair alarm set;with family/visitor present;with SCD's reapplied;Other (comment)(B heels elevated via towel rolls; polar care in place and activated) Nurse Communication: Mobility status;Precautions;Other (comment);Patient requests pain meds(pt's pain status) PT Visit Diagnosis: Muscle weakness (generalized) (M62.81);Difficulty in walking, not elsewhere classified (R26.2);Pain Pain - Right/Left: Left Pain - part of body: Knee     Time:  6283-6629 PT Time Calculation (min) (ACUTE ONLY): 44 min  Charges:  $Gait Training: 8-22 mins $Therapeutic Exercise: 8-22 mins $Therapeutic Activity: 8-22 mins                     Leitha Bleak, PT 09/23/18, 11:58 AM [phone]

## 2018-09-23 NOTE — Discharge Instructions (Signed)

## 2018-09-23 NOTE — Progress Notes (Signed)
   Subjective: 2 Days Post-Op Procedure(s) (LRB): TOTAL KNEE ARTHROPLASTY-LEFT NEEDS RNFA (Left) Patient reports pain as mild.   Patient is well, and has had no acute complaints or problems Denies any CP, SOB, ABD pain. We will continue therapy today.   Objective: Vital signs in last 24 hours: Temp:  [97.5 F (36.4 C)-97.9 F (36.6 C)] 97.8 F (36.6 C) (03/05 0912) Pulse Rate:  [75-86] 86 (03/05 0912) Resp:  [17-18] 18 (03/04 2348) BP: (113-118)/(52-71) 118/71 (03/05 0912) SpO2:  [94 %-100 %] 99 % (03/05 0912)  Intake/Output from previous day: 03/04 0701 - 03/05 0700 In: 2089.5 [P.O.:480; I.V.:1609.5] Out: 2300 [Urine:2300] Intake/Output this shift: No intake/output data recorded.  Recent Labs    09/21/18 1401 09/22/18 0513 09/23/18 0352  HGB 11.4* 10.2* 10.6*   Recent Labs    09/22/18 0513 09/23/18 0352  WBC 10.0 8.2  RBC 3.21* 3.40*  HCT 30.6* 32.7*  PLT 111* 109*   Recent Labs    09/22/18 0513 09/23/18 0352  NA 139 141  K 3.7 3.7  CL 111 108  CO2 21* 27  BUN 22 17  CREATININE 0.48 0.50  GLUCOSE 169* 145*  CALCIUM 8.1* 8.5*   No results for input(s): LABPT, INR in the last 72 hours.  EXAM General - Patient is Alert, Appropriate and Oriented Extremity - Neurovascular intact Sensation intact distally Intact pulses distally Dorsiflexion/Plantar flexion intact No cellulitis present Compartment soft Dressing - dressing C/D/I and no drainage, dressing changed Motor Function - intact, moving foot and toes well on exam.   Past Medical History:  Diagnosis Date  . Allergy   . Arthritis   . COPD (chronic obstructive pulmonary disease) ([place])   . Diabetes mellitus, type 2 ([place])   . Excessive daytime sleepiness 11/12/2017  . GERD (gastroesophageal reflux disease)   . Goiter   . History of kidney stones   . Hypertension   . Kidney stone   . Memory loss   . Mixed Alzheimer's and vascular dementia ([place]) 08/28/2017  . Osteoporosis    in lower back per  pt   . Plantar fasciitis   . Snoring 08/28/2017  . Stroke [facility])    residual left sided weakness  . Tremors of nervous system   . Vertigo     Assessment/Plan:   2 Days Post-Op Procedure(s) (LRB): TOTAL KNEE ARTHROPLASTY-LEFT NEEDS RNFA (Left) Active Problems:   S/P TKR (total knee replacement) using cement, left  Estimated body mass index is 37.24 kg/m as calculated from the following:   Height as of this encounter: 5\' 5"  (1.651 m).   Weight as of this encounter: 101.5 kg. Advance diet Up with therapy  Labs stable VSS CM to assist with discharge to home with HHPT.  Possible discharge to home today if completion of PT goals  DVT Prophylaxis - Lovenox, TED hose and SCDs Weight-Bearing as tolerated to left leg   T. Rachelle Hora, PA-C [place] 09/23/2018, 9:33 AM

## 2018-09-23 NOTE — Progress Notes (Signed)
Physical Therapy Treatment Patient Details Name: [name] MRN: [id number] DOB: 1955/09/29 Today's Date: 09/23/2018    History of Present Illness 63 yo female admitted for acute hospitalization status post L TKR (09/21/18), WBAT.    PT Comments    Pt able to progress to ambulating 140 feet with RW CGA but requiring consistent cueing for safety (pt appearing drowsy and bumping into walls or doors to R or L with walker; decreased cadence noted).  Able to perform 6 steps with R railing with CGA and cueing for safety.  8/10 L knee pain beginning/end of session (nurse notified).  Pt requiring consistent extra cueing for safety d/t drowsiness (discussed with MD Rudene Christians and pt's nurse).  Will continue to progress pt with strengthening, ROM, and progressive ambulation per pt tolerance.    Follow Up Recommendations  Home health PT;Supervision/Assistance - 24 hour     Equipment Recommendations  Rolling walker with 5" wheels;3in1 (PT)    Recommendations for Other Services OT consult     Precautions / Restrictions Precautions Precautions: Fall Restrictions Weight Bearing Restrictions: Yes LLE Weight Bearing: Weight bearing as tolerated    Mobility  Bed Mobility Overal bed mobility: Needs Assistance Bed Mobility: Sit to Supine       Sit to supine: Min guard;HOB elevated   General bed mobility comments: CGA L LE for safety sit to semi-supine; vc's for technique  Transfers Overall transfer level: Needs assistance Equipment used: Rolling walker (2 wheeled) Transfers: Sit to/from Stand Sit to Stand: Min guard         General transfer comment: CGA to stand from recliner and mat table; vc's for LE positioning required  Ambulation/Gait Ambulation/Gait assistance: Min guard Gait Distance (Feet): 140 Feet Assistive device: Rolling walker (2 wheeled)   Gait velocity: decreased   General Gait Details: partial step through gait pattern; initial vc's to stay closer to RW and increase  UE support through RW to offweight L LE; antalgic; decreased stance time L LE; vc's for safety (pt running into walls and doors on R or L side with walker--pt appearing drowsy requiring cueing for safety)   Stairs Stairs: Yes Stairs assistance: Min guard Stair Management: One rail Left;Step to pattern;Sideways Number of Stairs: 6 General stair comments: initial demo for technique and then vc's for technique   Wheelchair Mobility    Modified Rankin (Stroke Patients Only)       Balance Overall balance assessment: Needs assistance Sitting-balance support: No upper extremity supported;Feet supported Sitting balance-Leahy Scale: Normal Sitting balance - Comments: steady sitting reaching outside BOS   Standing balance support: No upper extremity supported Standing balance-Leahy Scale: Fair Standing balance comment: steady static standing                            Cognition Arousal/Alertness: Lethargic Behavior During Therapy: Flat affect Overall Cognitive Status: Difficult to assess                                 General Comments: pt appearing drowsy with decreased safety awareness      Exercises Total Joint Exercises Long Arc Quad: AAROM;Strengthening;Left;10 reps;Seated Knee Flexion: AROM;Strengthening;Left;10 reps;Seated    General Comments  Pt agreeable to PT session.      Pertinent Vitals/Pain Pain Assessment: 0-10 Pain Score: 8  Pain Location: L knee Pain Descriptors / Indicators: Aching;Tender;Sore Pain Intervention(s): Limited activity within patient's tolerance;Monitored during  session;Premedicated before session;Repositioned;Patient requesting pain meds-RN notified;Other (comment)(polar care applied and activated)  Vitals (HR and O2 on room air) stable and WFL throughout treatment session.    Home Living                      Prior Function            PT Goals (current goals can now be found in the care plan section)  Acute Rehab PT Goals Patient Stated Goal: to return home PT Goal Formulation: With patient Time For Goal Achievement: 10/06/18 Potential to Achieve Goals: Good Progress towards PT goals: Progressing toward goals    Frequency    BID      PT Plan Current plan remains appropriate    Co-evaluation              AM-PAC PT "6 Clicks" Mobility   Outcome Measure  Help needed turning from your back to your side while in a flat bed without using bedrails?: A Little Help needed moving from lying on your back to sitting on the side of a flat bed without using bedrails?: A Little Help needed moving to and from a bed to a chair (including a wheelchair)?: A Little Help needed standing up from a chair using your arms (e.g., wheelchair or bedside chair)?: A Little Help needed to walk in hospital room?: A Little Help needed climbing 3-5 steps with a railing? : A Little 6 Click Score: 18    End of Session Equipment Utilized During Treatment: Gait belt Activity Tolerance: Patient limited by pain Patient left: with call bell/phone within reach;with bed alarm set;with family/visitor present;with SCD's reapplied;Other (comment)(B heels elevated via towel rolls; polar care in place and activated) Nurse Communication: Mobility status;Precautions;Other (comment);Patient requests pain meds PT Visit Diagnosis: Muscle weakness (generalized) (M62.81);Difficulty in walking, not elsewhere classified (R26.2);Pain Pain - Right/Left: Left Pain - part of body: Knee     Time: 7782-4235 PT Time Calculation (min) (ACUTE ONLY): 42 min  Charges:  $Gait Training: 8-22 mins $Therapeutic Exercise: 8-22 mins $Therapeutic Activity: 8-22 mins                    Leitha Bleak, PT 09/23/18, 4:40 PM [phone]

## 2018-09-24 ENCOUNTER — Inpatient Hospital Stay: Payer: Medicare Other

## 2018-09-24 LAB — BLOOD GAS, ARTERIAL
Acid-Base Excess: 2 mmol/L (ref 0.0–2.0)
Bicarbonate: 25.6 mmol/L (ref 20.0–28.0)
FIO2: 0.21
O2 Saturation: 94.7 %
Patient temperature: 37
pCO2 arterial: 36 mmHg (ref 32.0–48.0)
pH, Arterial: 7.46 — ABNORMAL HIGH (ref 7.350–7.450)
pO2, Arterial: 70 mmHg — ABNORMAL LOW (ref 83.0–108.0)

## 2018-09-24 LAB — GLUCOSE, CAPILLARY
GLUCOSE-CAPILLARY: 159 mg/dL — AB (ref 70–99)
Glucose-Capillary: 111 mg/dL — ABNORMAL HIGH (ref 70–99)
Glucose-Capillary: 139 mg/dL — ABNORMAL HIGH (ref 70–99)
Glucose-Capillary: 118 mg/dL — ABNORMAL HIGH (ref 70–99)

## 2018-09-24 LAB — AMMONIA: Ammonia: 23 umol/L (ref 9–35)

## 2018-09-24 MED ORDER — SODIUM CHLORIDE 0.9 % IV SOLN
Freq: Once | INTRAVENOUS | Status: AC
  Administered 2018-09-24: 11:00:00 via INTRAVENOUS

## 2018-09-24 NOTE — Progress Notes (Signed)
PT Cancellation Note  Patient Details Name: [name] MRN: [id number] DOB: 02/20/1956   Cancelled Treatment:    Reason Eval/Treat Not Completed: Fatigue/lethargy limiting ability to participate.  Pt sleeping in bed upon PT arrival with breakfast tray in front of her (pt did not appear to have started eating).  Pt woken with vc's but then would immediately fall back asleep with multiple tries (nurse notified).  Will re-attempt PT treatment session later today.  Leitha Bleak, PT 09/24/18, 10:10 AM [phone]

## 2018-09-24 NOTE — Progress Notes (Signed)
Notified Dr. Rudene Christians that patient is still lethargic and falling asleep. Therapy did not work with her this morning due to the lethargy. Order to hold Baclofen, Gabapentin and give a 500 mL bolus or normal saline. Will re-evaluate after lunch.

## 2018-09-24 NOTE — Progress Notes (Signed)
Physical Therapy Treatment Patient Details Name: [name] MRN: [id number] DOB: 1956-01-06 Today's Date: 09/24/2018    History of Present Illness 63 yo female admitted for acute hospitalization status post L TKR (09/21/18), WBAT.    PT Comments    Pt appearing the most alert that this therapist has seen pt; pt talkative and able to participate appropriately in conversation.  Pt able to perform toilet transfer (BSC over toilet) with RW safely and then ambulate around nursing loop with RW CGA.  Overall pt appearing steady and safe with functional mobility (significant improvement with safety overall d/t improved alertness).  L knee flexion AROM to 100 degrees.  Will continue to progress pt with strengthening, ROM, and progressive functional mobility per pt tolerance.    Follow Up Recommendations  Home health PT;Supervision/Assistance - 24 hour     Equipment Recommendations  Rolling walker with 5" wheels;3in1 (PT)    Recommendations for Other Services OT consult     Precautions / Restrictions Precautions Precautions: Fall Restrictions Weight Bearing Restrictions: Yes LLE Weight Bearing: Weight bearing as tolerated    Mobility  Bed Mobility Overal bed mobility: Modified Independent Bed Mobility: Supine to Sit;Sit to Supine     Supine to sit: Modified independent (Device/Increase time) Sit to supine: Modified independent (Device/Increase time)   General bed mobility comments: mild increased effort to perform on own but no assist required  Transfers Overall transfer level: Needs assistance Equipment used: Rolling walker (2 wheeled) Transfers: Sit to/from Stand Sit to Stand: Min assist;Min guard         General transfer comment: pt initially with difficulty standing from bed requiring min assist but improved technique 2nd and 3rd trials (CGA) after initial vc's  Ambulation/Gait Ambulation/Gait assistance: Min guard Gait Distance (Feet): 200 Feet Assistive device:  Rolling walker (2 wheeled)   Gait velocity: decreased mildly   General Gait Details: partial step through gait pattern; steady; mild decreased stance time L LE   Stairs             Wheelchair Mobility    Modified Rankin (Stroke Patients Only)       Balance Overall balance assessment: Needs assistance Sitting-balance support: No upper extremity supported;Feet supported Sitting balance-Leahy Scale: Normal Sitting balance - Comments: steady sitting reaching outside BOS   Standing balance support: No upper extremity supported Standing balance-Leahy Scale: Good Standing balance comment: steady standing washing hands at sink                            Cognition Arousal/Alertness: Awake/alert Behavior During Therapy: Flat affect Overall Cognitive Status: Within Functional Limits for tasks assessed                                        Exercises Total Joint Exercises Long Arc Quad: AROM;Strengthening;Left;10 reps;Seated Goniometric ROM: L knee extension 6 degrees short of neutral semi-supine in bed; L knee flexion 100 degrees AROM sitting edge of bed    General Comments   Nursing cleared pt for participation in physical therapy.  Pt agreeable to PT session.      Pertinent Vitals/Pain Pain Assessment: 0-10 Faces Pain Scale: Hurts little more Pain Location: L knee Pain Descriptors / Indicators: Aching;Tender;Sore Pain Intervention(s): Limited activity within patient's tolerance;Monitored during session;Repositioned  Vitals (HR and O2 on room air) stable and WFL throughout treatment session.  Home Living                      Prior Function            PT Goals (current goals can now be found in the care plan section) Acute Rehab PT Goals Patient Stated Goal: to return home PT Goal Formulation: With patient Time For Goal Achievement: 10/06/18 Potential to Achieve Goals: Good Progress towards PT goals: Progressing toward  goals    Frequency    BID      PT Plan Current plan remains appropriate    Co-evaluation              AM-PAC PT "6 Clicks" Mobility   Outcome Measure  Help needed turning from your back to your side while in a flat bed without using bedrails?: A Little Help needed moving from lying on your back to sitting on the side of a flat bed without using bedrails?: A Little Help needed moving to and from a bed to a chair (including a wheelchair)?: A Little Help needed standing up from a chair using your arms (e.g., wheelchair or bedside chair)?: A Little Help needed to walk in hospital room?: A Little Help needed climbing 3-5 steps with a railing? : A Little 6 Click Score: 18    End of Session Equipment Utilized During Treatment: Gait belt Activity Tolerance: Patient tolerated treatment well Patient left: in bed;with call bell/phone within reach;with bed alarm set;with family/visitor present;with SCD's reapplied;Other (comment)(B heels elevated via towel rolls) Nurse Communication: Mobility status;Precautions;Other (comment) PT Visit Diagnosis: Muscle weakness (generalized) (M62.81);Difficulty in walking, not elsewhere classified (R26.2);Pain Pain - Right/Left: Left Pain - part of body: Knee     Time: 3664-4034 PT Time Calculation (min) (ACUTE ONLY): 33 min  Charges:  $Gait Training: 8-22 mins $Therapeutic Exercise: 8-22 mins                     Leitha Bleak, PT 09/24/18, 3:46 PM [phone]

## 2018-09-24 NOTE — Care Management Important Message (Signed)
Important Message  Patient Details  Name: ROLANDO WHITBY MRN: [id number] Date of Birth: 1955-11-01   Medicare Important Message Given:  Yes    Juliann Pulse A Remmy Riffe 09/24/2018, 11:15 AM

## 2018-09-24 NOTE — Care Management (Signed)
Called [facility] in [place] at [phone] to call in verbal order and check the price of Lovenox 40 mg Once Daily X 14 days price is $3.60, Notified the patient of the price

## 2018-09-24 NOTE — H&P (Addendum)
[facility] [place] NAME: [name]    MR#:  [id number]  DATE OF BIRTH:  1956-07-21  DATE OF ADMISSION:  09/21/2018  PRIMARY CARE PHYSICIAN: [name], DO   REQUESTING/REFERRING PHYSICIAN:   CHIEF COMPLAINT: Lethargy  HISTORY OF PRESENT ILLNESS: [name]  is a 63 y.o. female with a known history per below which includes excessive daytime sleepiness, memory loss, dementia history of CVA, was admitted to the hospital for elective left total knee replacement done on Tuesday by Dr. Rudene Christians, hospitalist service consulted today for continued excessive daytime sleepiness/altered mental status, patient evaluated on the floor, nurse was present for part of the discussion, patient does have waxing and waning lethargy, able to open eyes and answer questions appropriately, denies any pain/weakness/slurred speech/change in vision, or paresthesias, patient is now being admitted for acute altered mental status with known history of excessive daytime sleepiness/memory deficits/dementia/history of CVA.  PAST MEDICAL HISTORY:   Past Medical History:  Diagnosis Date  . Allergy   . Arthritis   . COPD (chronic obstructive pulmonary disease) ([place])   . Diabetes mellitus, type 2 ([place])   . Excessive daytime sleepiness 11/12/2017  . GERD (gastroesophageal reflux disease)   . Goiter   . History of kidney stones   . Hypertension   . Kidney stone   . Memory loss   . Mixed Alzheimer's and vascular dementia ([place]) 08/28/2017  . Osteoporosis    in lower back per pt   . Plantar fasciitis   . Snoring 08/28/2017  . Stroke [facility])    residual left sided weakness  . Tremors of nervous system   . Vertigo     PAST SURGICAL HISTORY:  Past Surgical History:  Procedure Laterality Date  . APPENDECTOMY    . BLADDER SURGERY    . CATARACT EXTRACTION W/PHACO Left 05/06/2017   Procedure: CATARACT EXTRACTION PHACO AND INTRAOCULAR LENS PLACEMENT ([place]) LEFT DIABETIC;   Surgeon: Leandrew Koyanagi, MD;  Location: [facility];  Service: Ophthalmology;  Laterality: Left;  Diabetic - oral meds  . CATARACT EXTRACTION W/PHACO Right 06/03/2017   Procedure: CATARACT EXTRACTION PHACO AND INTRAOCULAR LENS PLACEMENT ([place]) RIGHT DIABETIC;  Surgeon: Leandrew Koyanagi, MD;  Location: [facility];  Service: Ophthalmology;  Laterality: Right;  Diabetic - oral meds  . CHOLECYSTECTOMY    . FOOT SURGERY    . TOTAL ABDOMINAL HYSTERECTOMY    . TOTAL KNEE ARTHROPLASTY Left 09/21/2018   Procedure: TOTAL KNEE ARTHROPLASTY-LEFT NEEDS RNFA;  Surgeon: Hessie Knows, MD;  Location: [facility];  Service: Orthopedics;  Laterality: Left;    SOCIAL HISTORY:  Social History   Tobacco Use  . Smoking status: Never Smoker  . Smokeless tobacco: Never Used  Substance Use Topics  . Alcohol use: No    FAMILY HISTORY:  Family History  Problem Relation Age of Onset  . Healthy Mother   . Heart disease Father   . Alcohol abuse Father   . Alcohol abuse Brother   . Heart disease Sister   . Diabetes Sister   . Breast cancer Daughter   . Colon polyps Neg Hx   . Colon cancer Neg Hx   . Esophageal cancer Neg Hx   . Rectal cancer Neg Hx   . Stomach cancer Neg Hx     DRUG ALLERGIES:  Allergies  Allergen Reactions  . Hydrocodone-Acetaminophen Nausea And Vomiting    REVIEW OF SYSTEMS: Poor historian due to lethargy  CONSTITUTIONAL: No fever, fatigue  or weakness.  EYES: No blurred or double vision.  EARS, NOSE, AND THROAT: No tinnitus or ear pain.  RESPIRATORY: No cough, shortness of breath, wheezing or hemoptysis.  CARDIOVASCULAR: No chest pain, orthopnea, edema.  GASTROINTESTINAL: No nausea, vomiting, diarrhea or abdominal pain.  GENITOURINARY: No dysuria, hematuria.  ENDOCRINE: No polyuria, nocturia,  HEMATOLOGY: No anemia, easy bruising or bleeding SKIN: No rash or lesion. MUSCULOSKELETAL: No joint pain or arthritis.   NEUROLOGIC: No tingling, numbness,  weakness.  PSYCHIATRY: No anxiety or depression.   MEDICATIONS AT HOME:  Prior to Admission medications   Medication Sig Start Date End Date Taking? Authorizing Provider  ACCU-CHEK AVIVA PLUS test strip USE  TO CHECK BLOOD SUGAR THREE TIMES DAILY 12/25/16  Yes Verner Mould, MD  ACCU-CHEK SOFTCLIX LANCETS lancets USE  TO CHECK BLOOD SUGAR THREE TIMES DAILY 01/28/17  Yes Verner Mould, MD  albuterol (PROVENTIL HFA;VENTOLIN HFA) 108 (90 Base) MCG/ACT inhaler Inhale 1-2 puffs into the lungs every 6 (six) hours as needed for wheezing. 12/25/17  Yes Verner Mould, MD  albuterol (PROVENTIL) (2.5 MG/3ML) 0.083% nebulizer solution Take 3 mLs (2.5 mg total) by nebulization every 4 (four) hours as needed for wheezing. 12/25/17  Yes Verner Mould, MD  Alcohol Swabs (B-D SINGLE USE SWABS REGULAR) PADS USE  TO  CLEAN  SKIN  BEFORE CHECKING BLOOD SUGAR 03/30/17  Yes Verner Mould, MD  alendronate (FOSAMAX) 70 MG tablet TAKE 1 TABLET EVERY 7 DAYS Patient taking differently: Take 70 mg by mouth once a week. TAKE 1 TABLET EVERY 7 DAYS 09/29/16  Yes Verner Mould, MD  aspirin EC 81 MG tablet Take 81 mg by mouth daily.   Yes [provider]  atorvastatin (LIPITOR) 10 MG tablet Take 1 tablet (10 mg total) by mouth daily. 08/10/17  Yes Verner Mould, MD  baclofen (LIORESAL) 20 MG tablet Take 20 mg by mouth 3 (three) times daily.   Yes [provider]  CALCIUM CITRATE-VITAMIN D PO Take 1 tablet by mouth 2 (two) times daily.    Yes [provider]  clotrimazole (LOTRIMIN) 1 % cream Apply 1 application topically 2 (two) times daily. 08/10/17  Yes Verner Mould, MD  docusate sodium (COLACE) 100 MG capsule Take 100 mg by mouth daily.   Yes [provider]  DULoxetine (CYMBALTA) 60 MG capsule Take 1 capsule (60 mg total) by mouth daily. 09/15/18  Yes Mullis, Kiersten P, DO  gabapentin (NEURONTIN) 800 MG  tablet Take 1 tablet (800 mg total) by mouth 3 (three) times daily. 08/10/17  Yes Verner Mould, MD  JANUVIA 100 MG tablet TAKE 1 TABLET (100 MG TOTAL) BY MOUTH DAILY. Patient taking differently: Take 100 mg by mouth every evening.  07/13/18  Yes Mullis, Kiersten P, DO  lisinopril (PRINIVIL,ZESTRIL) 10 MG tablet Take 10 mg by mouth daily.   Yes [provider]  meclizine (ANTIVERT) 12.5 MG tablet TAKE 1 TABLET 2 TIMES DAILY AS NEEDED FOR DIZZINESS. Patient taking differently: Take 12.5 mg by mouth daily.  04/16/18  Yes Mullis, Kiersten P, DO  meloxicam (MOBIC) 15 MG tablet TAKE 1 TABLET EVERY DAY 07/17/17  Yes Verner Mould, MD  metFORMIN (GLUCOPHAGE) 1000 MG tablet Take 1 tablet (1,000 mg total) by mouth 2 (two) times daily with a meal. 08/10/17  Yes Verner Mould, MD  Multiple Vitamins-Minerals ([employer] SPECTRAVITE WOMENS SENIOR PO) Take 1 tablet by mouth daily.    Yes [provider]  omeprazole (  PRILOSEC) 20 MG capsule TAKE 1 CAPSULE EVERY DAY Patient taking differently: Take 20 mg by mouth every evening.  07/17/17  Yes Verner Mould, MD  ondansetron (ZOFRAN) 4 MG tablet TAKE 1 TABLET EVERY 8 HOURS AS NEEDED  FOR NAUSEA  AND  VOMITING Patient taking differently: Take 4 mg by mouth every 8 (eight) hours as needed for nausea or vomiting.  01/11/18  Yes Verner Mould, MD  Psyllium (METAMUCIL PO) Take 1 scoop by mouth daily.   Yes [provider]  acetaminophen (TYLENOL) 325 MG tablet Take 1-2 tablets (325-650 mg total) by mouth every 6 (six) hours as needed for mild pain (pain score 1-3 or temp > 100.5). 09/23/18   Duanne Guess, PA-C  beclomethasone (QVAR REDIHALER) 40 MCG/ACT inhaler Inhale 2 puffs into the lungs 2 (two) times daily. Patient not taking: Reported on 09/06/2018 12/25/17   Verner Mould, MD  docusate sodium (COLACE) 100 MG capsule Take 1 capsule (100 mg total) by mouth 2 (two) times daily.  09/23/18   Duanne Guess, PA-C  enoxaparin (LOVENOX) 40 MG/0.4ML injection Inject 0.4 mLs (40 mg total) into the skin daily for 14 days. 09/23/18 10/07/18  Duanne Guess, PA-C  methocarbamol (ROBAXIN) 500 MG tablet Take 1 tablet (500 mg total) by mouth every 6 (six) hours as needed for muscle spasms. 09/23/18   Duanne Guess, PA-C  oxyCODONE (OXY IR/ROXICODONE) 5 MG immediate release tablet Take 1-2 tablets (5-10 mg total) by mouth every 4 (four) hours as needed for moderate pain (pain score 4-6). 09/23/18   Duanne Guess, PA-C      PHYSICAL EXAMINATION:   VITAL SIGNS: Blood pressure 126/66, pulse 86, temperature 98.1 F (36.7 C), temperature source Oral, resp. rate 18, height 5\' 5"  (1.651 m), weight 101.5 kg, SpO2 99 %.  GENERAL:  63 y.o.-year-old patient lying in the bed with no acute distress.  EYES: Pupils equal, round, reactive to light and accommodation. No scleral icterus. Extraocular muscles intact.  HEENT: Head atraumatic, normocephalic. Oropharynx and nasopharynx clear.  NECK:  Supple, no jugular venous distention. No thyroid enlargement, no tenderness.  LUNGS: Normal breath sounds bilaterally, no wheezing, rales,rhonchi or crepitation. No use of accessory muscles of respiration.  CARDIOVASCULAR: S1, S2 normal. No murmurs, rubs, or gallops.  ABDOMEN: Soft, nontender, nondistended. Bowel sounds present. No organomegaly or mass.  EXTREMITIES: No pedal edema, cyanosis, or clubbing.  NEUROLOGIC: Cranial nerves II through XII are intact.  Limited strength testing due to knee replacement/decreased range of motion noted.  Sensation intact. Gait not checked.  PSYCHIATRIC: The patient is alert and oriented x 3.  SKIN: No obvious rash, lesion, or ulcer.   LABORATORY PANEL:   CBC Recent Labs  Lab 09/21/18 1401 09/22/18 0513 09/23/18 0352  WBC 8.2 10.0 8.2  HGB 11.4* 10.2* 10.6*  HCT 34.3* 30.6* 32.7*  PLT 125* 111* 109*  MCV 95.5 95.3 96.2  MCH 31.8 31.8 31.2  MCHC 33.2 33.3  32.4  RDW 15.9* 15.6* 16.1*   ------------------------------------------------------------------------------------------------------------------  Chemistries  Recent Labs  Lab 09/21/18 1401 09/22/18 0513 09/23/18 0352  NA  --  139 141  K  --  3.7 3.7  CL  --  111 108  CO2  --  21* 27  GLUCOSE  --  169* 145*  BUN  --  22 17  CREATININE 0.52 0.48 0.50  CALCIUM  --  8.1* 8.5*   ------------------------------------------------------------------------------------------------------------------ estimated creatinine clearance is 87.2 mL/min (by C-G formula based  on SCr of 0.5 mg/dL). ------------------------------------------------------------------------------------------------------------------ No results for input(s): TSH, T4TOTAL, T3FREE, THYROIDAB in the last 72 hours.  Invalid input(s): FREET3   Coagulation profile No results for input(s): INR, PROTIME in the last 168 hours. ------------------------------------------------------------------------------------------------------------------- No results for input(s): DDIMER in the last 72 hours. -------------------------------------------------------------------------------------------------------------------  Cardiac Enzymes No results for input(s): CKMB, TROPONINI, MYOGLOBIN in the last 168 hours.  Invalid input(s): CK ------------------------------------------------------------------------------------------------------------------ Invalid input(s): POCBNP  ---------------------------------------------------------------------------------------------------------------  Urinalysis    Component Value Date/Time   COLORURINE YELLOW (A) 09/08/2018 0900   APPEARANCEUR CLEAR (A) 09/08/2018 0900   APPEARANCEUR Hazy 10/21/2014 1320   LABSPEC 1.017 09/08/2018 0900   LABSPEC 1.015 10/21/2014 1320   PHURINE 5.0 09/08/2018 0900   GLUCOSEU NEGATIVE 09/08/2018 0900   GLUCOSEU Negative 10/21/2014 1320   HGBUR NEGATIVE 09/08/2018 0900    HGBUR trace-intact 04/26/2010 1614   BILIRUBINUR NEGATIVE 09/08/2018 0900   BILIRUBINUR Negative 10/21/2014 1320   KETONESUR NEGATIVE 09/08/2018 0900   PROTEINUR NEGATIVE 09/08/2018 0900   UROBILINOGEN 0.2 08/25/2012 2117   NITRITE POSITIVE (A) 09/08/2018 0900   LEUKOCYTESUR NEGATIVE 09/08/2018 0900   LEUKOCYTESUR Negative 10/21/2014 1320     RADIOLOGY: No results found.  EKG: Orders placed or performed during the hospital encounter of 09/08/18  . EKG 12 lead  . 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 which is exacerbated by polypharmacy-noted numerous psychotropic meds inpatient with organic brain disease We will check stat CMP, CBC, ammonia level, RPR, CT head without contrast, blood gas, neurochecks per routine, aspiration/fall/skin care precautions while in house, avoid psychotropic meds for now (Benadryl, Cymbalta, baclofen, Neurontin, meclizine, Reglan, tramadol, Ambien), and limit opioid medicines as much as possible  *Chronic dementia with history of CVA/memory loss Plan of care as stated above  *COPD without exacerbation Breathing treatments PRN  *Chronic diabetes mellitus type 2 Stable on current regiment, continue sliding scale insulin with Accu-Cheks per routine  *Chronic GERD without esophagitis EPI daily  All the records are reviewed and case discussed with ED provider. Management plans discussed with the patient, family and they are in agreement.  CODE STATUS:full    Code Status Orders  (From admission, onward)         Start     Ordered   09/21/18 1329  Full code  Continuous     09/21/18 1329        Code Status History    This patient has a current code status but no historical code status.       TOTAL TIME TAKING CARE OF THIS PATIENT: 45 minutes.    Avel Peace Jafeth Mustin M.D  on 09/24/2018   Between 7am to 6pm - Pager - [phone]  After 6pm go to [email or web address] - password [email or web address] [facility]  Office  [phone]  CC: Primary care physician; [name], DO   Note: This dictation was prepared with Dragon dictation along with smaller phrase technology. Any transcriptional errors that result from this process are unintentional.

## 2018-09-24 NOTE — Progress Notes (Signed)
Family Meeting Note  Advance Directive:yes  Today a meeting took place with the Patient.  Patient is able to participate   The following clinical team members were present during this meeting:MD  The following were discussed:Patient's diagnosis: Altered mental status, COPD, diabetes, dementia, Patient's progosis: Unable to determine and Goals for treatment: Full Code  Additional follow-up to be provided: prn  Time spent during discussion:20 minutes  Gorden Harms, MD

## 2018-09-24 NOTE — Progress Notes (Signed)
PT Cancellation Note  Patient Details Name: [name] MRN: [id number] DOB: 10-Mar-1956   Cancelled Treatment:    Reason Eval/Treat Not Completed: Fatigue/lethargy limiting ability to participate.  Discussed pt's drowsiness/decreased alertness with nurse who recommends re-attempting PT session this afternoon--pt now receiving fluids d/t above concerns (just recently started fluids).  Will re-attempt PT session this afternoon.  Leitha Bleak, PT 09/24/18, 11:38 AM [phone]

## 2018-09-24 NOTE — Progress Notes (Signed)
   Subjective: 3 Days Post-Op Procedure(s) (LRB): TOTAL KNEE ARTHROPLASTY-LEFT NEEDS RNFA (Left) Patient reports pain as mild.   Patient is well, and has had no acute complaints or problems Denies any CP, SOB, ABD pain. We will continue therapy today.   Objective: Vital signs in last 24 hours: Temp:  [97.8 F (36.6 C)-97.9 F (36.6 C)] 97.9 F (36.6 C) (03/05 2330) Pulse Rate:  [84-87] 87 (03/05 2330) Resp:  [18] 18 (03/05 2330) BP: (118-128)/(61-71) 128/65 (03/05 2330) SpO2:  [99 %] 99 % (03/05 2330)  Intake/Output from previous day: 03/05 0701 - 03/06 0700 In: 240 [P.O.:240] Out: -  Intake/Output this shift: No intake/output data recorded.  Recent Labs    09/21/18 1401 09/22/18 0513 09/23/18 0352  HGB 11.4* 10.2* 10.6*   Recent Labs    09/22/18 0513 09/23/18 0352  WBC 10.0 8.2  RBC 3.21* 3.40*  HCT 30.6* 32.7*  PLT 111* 109*   Recent Labs    09/22/18 0513 09/23/18 0352  NA 139 141  K 3.7 3.7  CL 111 108  CO2 21* 27  BUN 22 17  CREATININE 0.48 0.50  GLUCOSE 169* 145*  CALCIUM 8.1* 8.5*   No results for input(s): LABPT, INR in the last 72 hours.  EXAM General - Patient is Alert, Appropriate and Oriented Extremity - Neurovascular intact Sensation intact distally Intact pulses distally Dorsiflexion/Plantar flexion intact No cellulitis present Compartment soft Dressing - dressing C/D/I and scant drainage Motor Function - intact, moving foot and toes well on exam.   Past Medical History:  Diagnosis Date  . Allergy   . Arthritis   . COPD (chronic obstructive pulmonary disease) ([place])   . Diabetes mellitus, type 2 ([place])   . Excessive daytime sleepiness 11/12/2017  . GERD (gastroesophageal reflux disease)   . Goiter   . History of kidney stones   . Hypertension   . Kidney stone   . Memory loss   . Mixed Alzheimer's and vascular dementia ([place]) 08/28/2017  . Osteoporosis    in lower back per pt   . Plantar fasciitis   . Snoring 08/28/2017  .  Stroke [facility])    residual left sided weakness  . Tremors of nervous system   . Vertigo     Assessment/Plan:   3 Days Post-Op Procedure(s) (LRB): TOTAL KNEE ARTHROPLASTY-LEFT NEEDS RNFA (Left) Active Problems:   S/P TKR (total knee replacement) using cement, left  Estimated body mass index is 37.24 kg/m as calculated from the following:   Height as of this encounter: 5\' 5"  (1.651 m).   Weight as of this encounter: 101.5 kg. Advance diet Up with therapy  VSS CM to assist with discharge to home with HHPT.   Discharged home with home health PT today.  DVT Prophylaxis - Lovenox, TED hose and SCDs Weight-Bearing as tolerated to left leg   T. Rachelle Hora, PA-C [place] 09/24/2018, 7:43 AM

## 2018-09-25 LAB — COMPREHENSIVE METABOLIC PANEL
ALT: 19 U/L (ref 0–44)
AST: 25 U/L (ref 15–41)
Albumin: 2.9 g/dL — ABNORMAL LOW (ref 3.5–5.0)
Alkaline Phosphatase: 71 U/L (ref 38–126)
Anion gap: 10 (ref 5–15)
BUN: 17 mg/dL (ref 8–23)
CO2: 24 mmol/L (ref 22–32)
CREATININE: 0.44 mg/dL (ref 0.44–1.00)
Calcium: 8.7 mg/dL — ABNORMAL LOW (ref 8.9–10.3)
Chloride: 105 mmol/L (ref 98–111)
GFR calc Af Amer: >60 mL/min (ref >60)
GFR calc non Af Amer: >60 mL/min (ref >60)
Glucose, Bld: 147 mg/dL — ABNORMAL HIGH (ref 70–99)
Potassium: 3.7 mmol/L (ref 3.5–5.1)
Sodium: 139 mmol/L (ref 135–145)
Total Bilirubin: 0.9 mg/dL (ref 0.3–1.2)
Total Protein: 6.7 g/dL (ref 6.5–8.1)

## 2018-09-25 LAB — CBC
HCT: 32.7 % — ABNORMAL LOW (ref 36.0–46.0)
Hemoglobin: 10.8 g/dL — ABNORMAL LOW (ref 12.0–15.0)
MCH: 31 pg (ref 26.0–34.0)
MCHC: 33 g/dL (ref 30.0–36.0)
MCV: 94 fL (ref 80.0–100.0)
Platelets: 133 10*3/uL — ABNORMAL LOW (ref 150–400)
RBC: 3.48 MIL/uL — ABNORMAL LOW (ref 3.87–5.11)
RDW: 15.8 % — AB (ref 11.5–15.5)
WBC: 8.5 10*3/uL (ref 4.0–10.5)
nRBC: 0 % (ref 0.0–0.2)

## 2018-09-25 LAB — GLUCOSE, CAPILLARY: Glucose-Capillary: 137 mg/dL — ABNORMAL HIGH (ref 70–99)

## 2018-09-25 MED ORDER — OXYCODONE HCL 5 MG PO TABS: 2.5000 mg | ORAL_TABLET | ORAL | 0 refills | Status: DC | PRN

## 2018-09-25 NOTE — Progress Notes (Signed)
   Subjective: 4 Days Post-Op Procedure(s) (LRB): TOTAL KNEE ARTHROPLASTY-LEFT NEEDS RNFA (Left) Patient reports pain as mild.   Patient is well, and has had no acute complaints or problems Denies any CP, SOB, ABD pain. We will continue therapy today.   Objective: Vital signs in last 24 hours: Temp:  [98.1 F (36.7 C)-98.5 F (36.9 C)] 98.5 F (36.9 C) (03/06 2339) Pulse Rate:  [86-98] 98 (03/06 2339) Resp:  [18-24] 24 (03/06 2339) BP: (110-126)/(60-66) 125/65 (03/06 2339) SpO2:  [99 %] 99 % (03/06 2339)  Intake/Output from previous day: 03/06 0701 - 03/07 0700 In: 480 [P.O.:480] Out: -  Intake/Output this shift: No intake/output data recorded.  Recent Labs    09/23/18 0352 09/25/18 0500  HGB 10.6* 10.8*   Recent Labs    09/23/18 0352 09/25/18 0500  WBC 8.2 8.5  RBC 3.40* 3.48*  HCT 32.7* 32.7*  PLT 109* 133*   Recent Labs    09/23/18 0352 09/25/18 0500  NA 141 139  K 3.7 3.7  CL 108 105  CO2 27 24  BUN 17 17  CREATININE 0.50 0.44  GLUCOSE 145* 147*  CALCIUM 8.5* 8.7*   No results for input(s): LABPT, INR in the last 72 hours.  EXAM General - Patient is Alert, Appropriate and Oriented.  Extremity - Neurovascular intact Sensation intact distally Intact pulses distally Dorsiflexion/Plantar flexion intact No cellulitis present Compartment soft Dressing - dressing C/D/I and scant drainage Motor Function - intact, moving foot and toes well on exam.   Past Medical History:  Diagnosis Date  . Allergy   . Arthritis   . COPD (chronic obstructive pulmonary disease) ([place])   . Diabetes mellitus, type 2 ([place])   . Excessive daytime sleepiness 11/12/2017  . GERD (gastroesophageal reflux disease)   . Goiter   . History of kidney stones   . Hypertension   . Kidney stone   . Memory loss   . Mixed Alzheimer's and vascular dementia ([place]) 08/28/2017  . Osteoporosis    in lower back per pt   . Plantar fasciitis   . Snoring 08/28/2017  . Stroke [facility])    residual left sided weakness  . Tremors of nervous system   . Vertigo     Assessment/Plan:   4 Days Post-Op Procedure(s) (LRB): TOTAL KNEE ARTHROPLASTY-LEFT NEEDS RNFA (Left) Active Problems:   S/P TKR (total knee replacement) using cement, left  Estimated body mass index is 37.24 kg/m as calculated from the following:   Height as of this encounter: 5\' 5"  (1.651 m).   Weight as of this encounter: 101.5 kg. Advance diet Up with therapy  VSS CM to assist with discharge to home with HHPT.   Discharged home with home health PT today.  DVT Prophylaxis - Lovenox, TED hose and SCDs Weight-Bearing as tolerated to left leg   T. Rachelle Hora, PA-C [place] 09/25/2018, 8:24 AM

## 2018-09-25 NOTE — Progress Notes (Signed)
DISCHARGE NOTE:  Pt and daughter given discharge and scripts. Pt verbalized understanding. Pts surgical dressing changed, (honeycomb) clean, dry, intact. TED hose on both legs, walker sent with pt. Pt wheeled to car by staff member.

## 2018-09-25 NOTE — Progress Notes (Signed)
Physical Therapy Treatment Patient Details Name: [name] MRN: [id number] DOB: 02-Nov-1955 Today's Date: 09/25/2018    History of Present Illness 63 yo female admitted for acute hospitalization status post L TKR (09/21/18), WBAT.    PT Comments    Assisted pt to commode to void and BM then ambulated around unit x 1 with walker and min guard.  Overall progressing well.  Gait remains slow but steady with no LOB or buckling noted.   Follow Up Recommendations  Home health PT;Supervision/Assistance - 24 hour     Equipment Recommendations  Rolling walker with 5" wheels;3in1 (PT)    Recommendations for Other Services       Precautions / Restrictions Precautions Precautions: Fall Restrictions Weight Bearing Restrictions: No LLE Weight Bearing: Weight bearing as tolerated    Mobility  Bed Mobility Overal bed mobility: Modified Independent Bed Mobility: Supine to Sit;Sit to Supine     Supine to sit: Modified independent (Device/Increase time) Sit to supine: Modified independent (Device/Increase time)      Transfers Overall transfer level: Needs assistance Equipment used: Rolling walker (2 wheeled) Transfers: Sit to/from Stand Sit to Stand: Min assist;Min guard            Ambulation/Gait Ambulation/Gait assistance: Min guard Gait Distance (Feet): 200 Feet Assistive device: Rolling walker (2 wheeled) Gait Pattern/deviations: Step-to pattern;Step-through pattern Gait velocity: decreased mildly   General Gait Details: irregular pattern but no LOB or buckling noted.  Mostly step through,   Stairs         General stair comments: declined, felt comfortable from prior experience and training in previous PT session.   Wheelchair Mobility    Modified Rankin (Stroke Patients Only)       Balance Overall balance assessment: Needs assistance Sitting-balance support: No upper extremity supported;Feet supported Sitting balance-Leahy Scale: Normal Sitting balance  - Comments: steady sitting reaching outside BOS   Standing balance support: No upper extremity supported Standing balance-Leahy Scale: Good Standing balance comment: steady with personal care after commode.                            Cognition Arousal/Alertness: Awake/alert Behavior During Therapy: WFL for tasks assessed/performed Overall Cognitive Status: Within Functional Limits for tasks assessed                                        Exercises Total Joint Exercises Goniometric ROM:  4-103 Other Exercises Other Exercises: LAQ and heel slides x 10 in sitting,  quad sets, SLR, heel slides     General Comments        Pertinent Vitals/Pain Pain Assessment: 0-10 Pain Score: 2  Pain Location: L knee Pain Descriptors / Indicators: Aching;Tender;Sore Pain Intervention(s): Limited activity within patient's tolerance;Monitored during session    Home Living                      Prior Function            PT Goals (current goals can now be found in the care plan section) Progress towards PT goals: Progressing toward goals    Frequency    BID      PT Plan Current plan remains appropriate    Co-evaluation              AM-PAC PT "6 Clicks" Mobility   Outcome Measure  Help needed turning from your back to your side while in a flat bed without using bedrails?: A Little Help needed moving from lying on your back to sitting on the side of a flat bed without using bedrails?: A Little Help needed moving to and from a bed to a chair (including a wheelchair)?: A Little Help needed standing up from a chair using your arms (e.g., wheelchair or bedside chair)?: A Little Help needed to walk in hospital room?: A Little Help needed climbing 3-5 steps with a railing? : A Little 6 Click Score: 18    End of Session Equipment Utilized During Treatment: Gait belt Activity Tolerance: Patient tolerated treatment well Patient left: in bed;with  bed alarm set;with call bell/phone within reach   Pain - Right/Left: Left Pain - part of body: Knee     Time: 2836-6294 PT Time Calculation (min) (ACUTE ONLY): 27 min  Charges:  $Gait Training: 8-22 mins $Therapeutic Exercise: 8-22 mins                    Chesley Noon, PTA 09/25/18, 10:15 AM

## 2018-09-25 NOTE — Progress Notes (Signed)
[facility]                                                                                                                                                                                  Patient Demographics   [name], is a 63 y.o. female, DOB - 08-Oct-1955, SHF:[id number]  Admit date - 09/21/2018   Admitting Physician Hessie Knows, MD  Outpatient Primary MD for the patient is Mina Marble P, DO   LOS - 4  Subjective: Patient doing much better confusion is now resolved    Review of Systems:   CONSTITUTIONAL: No documented fever. No fatigue, weakness. No weight gain, no weight loss.  EYES: No blurry or double vision.  ENT: No tinnitus. No postnasal drip. No redness of the oropharynx.  RESPIRATORY: No cough, no wheeze, no hemoptysis. No dyspnea.  CARDIOVASCULAR: No chest pain. No orthopnea. No palpitations. No syncope.  GASTROINTESTINAL: No nausea, no vomiting or diarrhea. No abdominal pain. No melena or hematochezia.  GENITOURINARY: No dysuria or hematuria.  ENDOCRINE: No polyuria or nocturia. No heat or cold intolerance.  HEMATOLOGY: No anemia. No bruising. No bleeding.  INTEGUMENTARY: No rashes. No lesions.  MUSCULOSKELETAL: No arthritis. No swelling. No gout.  NEUROLOGIC: No numbness, tingling, or ataxia. No seizure-type activity.  PSYCHIATRIC: No anxiety. No insomnia. No ADD.    Vitals:   Vitals:   09/24/18 0928 09/24/18 0944 09/24/18 2339 09/25/18 0830  BP: 110/60 126/66 125/65 122/69  Pulse:  86 98 96  Resp:  18 (!) 24 20  Temp:  98.1 F (36.7 C) 98.5 F (36.9 C) 98 F (36.7 C)  TempSrc:  Oral Oral Oral  SpO2:  99% 99% 98%  Weight:      Height:        Wt Readings from Last 3 Encounters:  09/21/18 101.5 kg  09/08/18 100.7 kg  05/19/18 103 kg     Intake/Output Summary (Last 24 hours) at 09/25/2018 1041 Last data filed at 09/25/2018 1010 Gross per 24 hour  Intake 720 ml  Output -  Net 720 ml    Physical  Exam:   GENERAL: Pleasant-appearing in no apparent distress.  HEAD, EYES, EARS, NOSE AND THROAT: Atraumatic, normocephalic. Extraocular muscles are intact. Pupils equal and reactive to light. Sclerae anicteric. No conjunctival injection. No oro-pharyngeal erythema.  NECK: Supple. There is no jugular venous distention. No bruits, no lymphadenopathy, no thyromegaly.  HEART: Regular rate and rhythm,. No murmurs, no rubs, no clicks.  LUNGS: Clear to auscultation bilaterally. No rales or rhonchi. No wheezes.  ABDOMEN: Soft, flat, nontender, nondistended. Has good bowel sounds. No hepatosplenomegaly appreciated.  EXTREMITIES: No evidence of any  cyanosis, clubbing, or peripheral edema.  +2 pedal and radial pulses bilaterally.  NEUROLOGIC: The patient is alert, awake, and oriented x3 with no focal motor or sensory deficits appreciated bilaterally.  SKIN: Moist and warm with no rashes appreciated.  Psych: Not anxious, depressed LN: No inguinal LN enlargement    Antibiotics   Anti-infectives (From admission, onward)   Start     Dose/Rate Route Frequency Ordered Stop   09/21/18 1600  ceFAZolin (ANCEF) IVPB 2g/100 mL premix     2 g 200 mL/hr over 30 Minutes Intravenous Every 6 hours 09/21/18 1329 09/21/18 2215   09/21/18 1127  gentamicin (GARAMYCIN) 80 mg in sodium chloride 0.9 % 500 mL irrigation  Status:  Discontinued       As needed 09/21/18 1128 09/21/18 1156   09/21/18 0841  ceFAZolin (ANCEF) 2-4 GM/100ML-% IVPB    Note to Pharmacy:  Garfield Cornea   : cabinet override      09/21/18 0841 09/21/18 1010   09/20/18 2200  ceFAZolin (ANCEF) IVPB 2g/100 mL premix     2 g 200 mL/hr over 30 Minutes Intravenous  Once 09/20/18 2155 09/21/18 1015      Medications   Scheduled Meds: . aspirin EC  81 mg Oral Daily  . atorvastatin  10 mg Oral Daily  . calcium citrate-vitamin D  1 tablet Oral BID  . clotrimazole  1 application Topical BID  . docusate sodium  100 mg Oral BID  . enoxaparin (LOVENOX)  injection  30 mg Subcutaneous Q12H  . insulin aspart  0-15 Units Subcutaneous TID WC  . linagliptin  5 mg Oral Daily  . lisinopril  10 mg Oral Daily  . metFORMIN  1,000 mg Oral BID WC  . pantoprazole  40 mg Oral Daily  . psyllium  1 packet Oral Daily   Continuous Infusions: . sodium chloride Stopped (09/23/18 0440)   PRN Meds:.acetaminophen, albuterol, alum & mag hydroxide-simeth, bisacodyl, HYDROmorphone (DILAUDID) injection, magnesium citrate, magnesium hydroxide, menthol-cetylpyridinium **OR** phenol, ondansetron **OR** ondansetron (ZOFRAN) IV, oxyCODONE, oxyCODONE   Data Review:   Micro Results No results found for this or any previous visit (from the past 240 hour(s)).  Radiology Reports Dg Knee 1-2 Views Left  Result Date: 09/21/2018 CLINICAL DATA:  Postop left knee arthroplasty. EXAM: LEFT KNEE - 1-2 VIEW COMPARISON:  03/25/2018 FINDINGS: Left knee arthropathic components appear well seated well aligned. No acute fracture or evidence of an operative complication. IMPRESSION: Well-positioned total left knee arthroplasty. Electronically Signed   By: Lajean Manes M.D.   On: 09/21/2018 12:26   Ct Head Wo Contrast  Result Date: 09/24/2018 CLINICAL DATA:  63 y/o  F; acute toxic metabolic encephalopathy. EXAM: CT HEAD WITHOUT CONTRAST TECHNIQUE: Contiguous axial images were obtained from the base of the skull through the vertex without intravenous contrast. COMPARISON:  02/22/2015 MRI head. 10/21/2014 CT head. FINDINGS: Brain: No evidence of acute infarction, hemorrhage, hydrocephalus, extra-axial collection or mass lesion/mass effect. Few nonspecific white matter hypodensities are compatible with mild chronic microvascular ischemic changes. Vascular: No hyperdense vessel or unexpected calcification. Skull: Normal. Negative for fracture or focal lesion. Sinuses/Orbits: Left frontal, anterior ethmoid, and maxillary sinus opacification which. Partial opacification of right-sided mastoid air  cells. Additional visible paranasal sinuses and the left mastoid air cells are normally aerated. Bilateral intra-ocular lens replacement. Other: None. IMPRESSION: 1. No acute intracranial abnormality identified. 2. Mild chronic microvascular ischemic changes of the brain. 3. Left frontal, anterior ethmoid, and maxillary sinus opacification compatible with left middle meatus  obstruction. Direct visualization recommended. Electronically Signed   By: Kristine Garbe M.D.   On: 09/24/2018 14:43   Ct Knee Left Wo Contrast  Result Date: 08/30/2018 CLINICAL DATA:  Preoperative planning study using the MY KNEE protocol. Chronic left knee pain. Patient for knee replacement EXAM: CT OF THE LEFT KNEE WITHOUT CONTRAST TECHNIQUE: Multidetector CT imaging of the left knee was performed according to the standard protocol. Multiplanar CT image reconstructions were also generated. Axial imaging only of the left hip and ankle was also performed. COMPARISON:  Plain films left knee 03/25/2018. FINDINGS: Bones/Joint/Cartilage Axial imaging of the left hip demonstrates no acute abnormality. There is disease the symphysis pubis and left SI joint. No acute abnormality left ankle is identified. Degenerative change about the tarsometatarsal joints appears worst at the second and third. Calcifications in the plantar fascia are noted. The patient has osteoarthritis about all 3 compartments of the knee. Degenerative change is worst medially where there is bone-on-bone joint space narrowing. Several small loose bodies are identified in the joint. The patient has a moderately large joint effusion containing debris. Ligaments Suboptimally assessed by CT. Muscles and Tendons Intact and normal appearance. Soft tissues No acute or focal abnormality. IMPRESSION: Advanced left knee osteoarthritis is worst in the medial compartment. Joint effusion containing debris and loose bodies are noted. Electronically Signed   By: Inge Rise  M.D.   On: 08/30/2018 15:26     CBC Recent Labs  Lab 09/21/18 1401 09/22/18 0513 09/23/18 0352 09/25/18 0500  WBC 8.2 10.0 8.2 8.5  HGB 11.4* 10.2* 10.6* 10.8*  HCT 34.3* 30.6* 32.7* 32.7*  PLT 125* 111* 109* 133*  MCV 95.5 95.3 96.2 94.0  MCH 31.8 31.8 31.2 31.0  MCHC 33.2 33.3 32.4 33.0  RDW 15.9* 15.6* 16.1* 15.8*    Chemistries  Recent Labs  Lab 09/21/18 1401 09/22/18 0513 09/23/18 0352 09/25/18 0500  NA  --  139 141 139  K  --  3.7 3.7 3.7  CL  --  111 108 105  CO2  --  21* 27 24  GLUCOSE  --  169* 145* 147*  BUN  --  22 17 17   CREATININE 0.52 0.48 0.50 0.44  CALCIUM  --  8.1* 8.5* 8.7*  AST  --   --   --  25  ALT  --   --   --  19  ALKPHOS  --   --   --  71  BILITOT  --   --   --  0.9   ------------------------------------------------------------------------------------------------------------------ estimated creatinine clearance is 87.2 mL/min (by C-G formula based on SCr of 0.44 mg/dL). ------------------------------------------------------------------------------------------------------------------ No results for input(s): HGBA1C in the last 72 hours. ------------------------------------------------------------------------------------------------------------------ No results for input(s): CHOL, HDL, LDLCALC, TRIG, CHOLHDL, LDLDIRECT in the last 72 hours. ------------------------------------------------------------------------------------------------------------------ No results for input(s): TSH, T4TOTAL, T3FREE, THYROIDAB in the last 72 hours.  Invalid input(s): FREET3 ------------------------------------------------------------------------------------------------------------------ No results for input(s): VITAMINB12, FOLATE, FERRITIN, TIBC, IRON, RETICCTPCT in the last 72 hours.  Coagulation profile No results for input(s): INR, PROTIME in the last 168 hours.  No results for input(s): DDIMER in the last 72 hours.  Cardiac Enzymes No results for  input(s): CKMB, TROPONINI, MYOGLOBIN in the last 168 hours.  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              DVT Prophylaxis  Lovenox   Lab Results  Component Value Date   PLT 133 (L) 09/25/2018     Time Spent in minutes   35 minutes  Greater than 50% of time spent in care coordination and counseling patient regarding the condition and plan of care.   Dustin Flock M.D on 09/25/2018 at 10:41 AM  Between 7am to 6pm - Pager - [phone]  After 6pm go to [email or web address] - Proofreader  [employer]   Office  [phone]

## 2018-09-25 NOTE — Care Management Note (Signed)
Case Management Note  Patient Details  Name: [name] MRN: [id number] Date of Birth: July 03, 1956  Subjective/Objective: Patient to be discharged per MD order. Orders in place for home health services. Previous RNCM had completed workup for discharge with [facility] home [facility]. Notified Helene Kelp from [facility] of pending discharge. [employer] has delivered [facility] and rolling walker. Family to transport.                     Action/Plan:   Expected Discharge Date:  09/25/18               Expected Discharge Plan:     In-House Referral:     Discharge planning Services  CM Consult  Post Acute Care Choice:    Choice offered to:  Patient  DME Arranged:  [facility] rolling, 3-N-1 DME Agency:     HH Arranged:  PT [place]:  [facility]  Status of Service:  Completed, signed off  If discussed at [employer], dates discussed:    Additional Comments:  Latanya Maudlin, RN 09/25/2018, 8:49 AM

## 2018-09-28 LAB — RPR: RPR: NONREACTIVE

## 2018-10-01 ENCOUNTER — Other Ambulatory Visit: Payer: Self-pay | Admitting: *Deleted

## 2018-10-01 NOTE — Patient Outreach (Signed)
[facility]) Care Management  10/01/2018  [name] 02-Apr-1956 [id number]   Subjective: Telephone call to patient's home  / mobile number, no answer, left HIPAA compliant voicemail message, and requested call back.    Objective: Per KPN (Knowledge Performance Now, point of care tool) and chart review, patient hospitalized 09/21/2018 - 09/25/2018 for PRIMARY OSTEOARTHRITIS OF LEFT KNEE, status post TOTAL KNEE ARTHROPLASTY-LEFT NEEDS RNFA (Left) on 09/21/2018.   Patient also has a history of Mixed Alzheimer's and vascular dementia, diabetes, Vitamin D deficiency, Osteopenia, Goiter, Stroke with residual left sided weakness, and COPD.       Assessment: Received [facility] EMMI General Discharge Red Alert flag follow up referral on 10/01/2018.   Red Alert Flag trigger, Day #4, patient answered yes to the following question: Lost interest in things?   [facility] EMMI follow up pending patient contact.     Plan: RNCM will send unsuccessful outreach letter, [facility] pamphlet, handout: Know Before You Go, will call patient for 2nd telephone outreach attempt within 4 business days, [facility] EMMI follow up, and proceed with case closure, within 10 business days if no return call.       [name] H. Annia Friendly, BSN, [place] [facility] Telephonic CM Phone: [phone] Fax: [phone]

## 2018-10-04 ENCOUNTER — Other Ambulatory Visit: Payer: Self-pay | Admitting: *Deleted

## 2018-10-04 NOTE — Patient Outreach (Signed)
[facility]) Care Management  10/04/2018  [name] 06/02/1956 [id number]   Subjective: Telephone call to patient's home  / mobile number, no answer, left HIPAA compliant voicemail message, and requested call back.    Objective: Per KPN (Knowledge Performance Now, point of care tool) and chart review, patient hospitalized 09/21/2018 - 09/25/2018 for PRIMARY OSTEOARTHRITIS OF LEFT KNEE, status post TOTAL KNEE ARTHROPLASTY-LEFT NEEDS RNFA (Left) on 09/21/2018.   Patient also has a history of Mixed Alzheimer's and vascular dementia, diabetes, Vitamin D deficiency, Osteopenia, Goiter, Stroke with residual left sided weakness, and COPD.       Assessment: Received [facility] EMMI General Discharge Red Alert flag follow up referral on 10/01/2018.   Red Alert Flag trigger, Day #4, patient answered yes to the following question: Lost interest in things?   [facility] EMMI follow up pending patient contact.     Plan: RNCM has sent  unsuccessful outreach letter, [facility] pamphlet, handout: Know Before You Go, will call patient for 3rd telephone outreach attempt within 4 business days, [facility] EMMI follow up, and proceed with case closure, within 10 business days if no return call.      [name], BSN, [employer] [facility] Telephonic CM Phone: [phone] Fax: [phone]

## 2018-10-05 ENCOUNTER — Other Ambulatory Visit: Payer: Self-pay | Admitting: *Deleted

## 2018-10-05 NOTE — Patient Outreach (Signed)
[facility]) Care Management  10/05/2018  [name] 02/15/1956 [id number]   Subjective: Telephone call to patient's home / mobile number, spoke with patient, and HIPAA verified.  Discussed [facility] EMMI General Discharge follow up, patient voiced understanding, and is in agreement to follow up.  Patient states she no longer has [employer] is her primary insurance as of 09/19/2018.  Patient states she remember receiving EMMI automated calls, does not remember saying yes to lost interest in things, has not lost interest in things, things are going good, has completed home health physical therapy, and will be starting outpatient physical therapy at her MD's office in the near future.  States she is having the normal pain and being managed without difficulty.  Patient states she does not have any education material, EMMI follow up, care coordination, care management, disease monitoring, transportation, community resource, or pharmacy needs at this time.  States she is very appreciative of the follow up.    Objective:Per KPN (Knowledge Performance Now, point of care tool) and chart review,patient hospitalized 09/21/2018 - 09/25/2018 forPRIMARY OSTEOARTHRITIS OF LEFT KNEE, status postTOTAL KNEE ARTHROPLASTY-LEFT NEEDS RNFA (Left)on 09/21/2018. Patient also has a history of Mixed Alzheimer's and vascular dementia, diabetes,Vitamin D deficiency,Osteopenia,Goiter,Strokewithresidual left sided weakness, and COPD.     Assessment: Received [facility] EMMI General Discharge Red Alert flag follow up referral on 10/01/2018. Red Alert Flag trigger, Day #4, patient answered yes to the following question: Lost interest in things?EMMI follow up completed and no further care management needs.      Plan:RNCM will complete case closure due to follow up completed / no care management needs.        Gracelee Stemmler H. Annia Friendly, BSN,  [place] [facility] Telephonic CM Phone: [phone] Fax: [phone]

## 2018-10-28 ENCOUNTER — Other Ambulatory Visit: Payer: Self-pay | Admitting: Family Medicine

## 2018-10-28 DIAGNOSIS — K219 Gastro-esophageal reflux disease without esophagitis: Secondary | ICD-10-CM

## 2018-10-28 DIAGNOSIS — M8580 Other specified disorders of bone density and structure, unspecified site: Secondary | ICD-10-CM

## 2018-10-28 DIAGNOSIS — E119 Type 2 diabetes mellitus without complications: Secondary | ICD-10-CM

## 2018-10-28 DIAGNOSIS — G8929 Other chronic pain: Secondary | ICD-10-CM

## 2018-10-28 DIAGNOSIS — M25561 Pain in right knee: Principal | ICD-10-CM

## 2018-10-28 MED ORDER — BACLOFEN 20 MG PO TABS: 20.0000 mg | ORAL_TABLET | Freq: Three times a day (TID) | ORAL | 2 refills | Status: DC

## 2018-10-28 MED ORDER — MELOXICAM 15 MG PO TABS: 15.0000 mg | ORAL_TABLET | Freq: Every day | ORAL | 2 refills | Status: DC

## 2018-10-28 MED ORDER — SITAGLIPTIN PHOSPHATE 100 MG PO TABS: 100.0000 mg | ORAL_TABLET | Freq: Every evening | ORAL | 2 refills | Status: DC

## 2018-10-28 MED ORDER — OMEPRAZOLE 20 MG PO CPDR: 20.0000 mg | DELAYED_RELEASE_CAPSULE | Freq: Every evening | ORAL | 2 refills | Status: DC

## 2018-10-28 MED ORDER — ALENDRONATE SODIUM 70 MG PO TABS: 70.0000 mg | ORAL_TABLET | ORAL | 2 refills | Status: DC

## 2018-10-28 NOTE — Telephone Encounter (Signed)
Pt called about her needing several meds refilled from her knee repalcement. Please give pt a call back.

## 2018-10-28 NOTE — Telephone Encounter (Signed)
Patient called for refills of the following: Alendronate 70mg  Baclofen 20mg  Meloxicam 15mg  Januvia 100mg   Omeprazole 20mg   Refills sent to pharmacy.   Patient also asked about getting new nebulizer machine. Patient received machine in June 2019 by Dr. Avon Gully. Given I am currently not in clinic and unsure of procedure for receiving new machine, recommended patient call clinic to inquire about new machine. Patient understood and agreed to plan.

## 2018-11-04 NOTE — Telephone Encounter (Signed)
Pt called back about nebulizer machine.   I contacted [employer] and there is no warranty on the machines.  Per [employer] pt would need to call the manufacturer.    Pt advised and agreeable with plan. Christen Bame, CMA

## 2018-11-05 ENCOUNTER — Other Ambulatory Visit: Payer: Self-pay

## 2018-11-05 DIAGNOSIS — H8110 Benign paroxysmal vertigo, unspecified ear: Secondary | ICD-10-CM

## 2018-11-05 NOTE — Telephone Encounter (Signed)
Pt called nurse line stating she would like a refill on Meclizine. Pt stated her pcp would fill this for her when she needed it without being seen. Please advise.

## 2018-11-06 MED ORDER — MECLIZINE HCL 12.5 MG PO TABS: ORAL_TABLET | ORAL | 0 refills | Status: DC

## 2018-11-30 ENCOUNTER — Other Ambulatory Visit: Payer: Self-pay | Admitting: Family Medicine

## 2018-11-30 DIAGNOSIS — K219 Gastro-esophageal reflux disease without esophagitis: Secondary | ICD-10-CM

## 2018-11-30 DIAGNOSIS — M8580 Other specified disorders of bone density and structure, unspecified site: Secondary | ICD-10-CM

## 2018-12-16 ENCOUNTER — Other Ambulatory Visit: Payer: Self-pay | Admitting: Family Medicine

## 2018-12-16 DIAGNOSIS — E119 Type 2 diabetes mellitus without complications: Secondary | ICD-10-CM

## 2019-02-14 ENCOUNTER — Encounter: Payer: Self-pay | Admitting: Gastroenterology

## 2019-02-17 ENCOUNTER — Telehealth: Payer: Self-pay | Admitting: *Deleted

## 2019-02-17 ENCOUNTER — Other Ambulatory Visit: Payer: Self-pay

## 2019-02-17 DIAGNOSIS — R112 Nausea with vomiting, unspecified: Secondary | ICD-10-CM

## 2019-02-17 DIAGNOSIS — E1169 Type 2 diabetes mellitus with other specified complication: Secondary | ICD-10-CM

## 2019-02-17 DIAGNOSIS — H8110 Benign paroxysmal vertigo, unspecified ear: Secondary | ICD-10-CM

## 2019-02-17 DIAGNOSIS — G894 Chronic pain syndrome: Secondary | ICD-10-CM

## 2019-02-17 DIAGNOSIS — E119 Type 2 diabetes mellitus without complications: Secondary | ICD-10-CM

## 2019-02-17 MED ORDER — ONDANSETRON HCL 4 MG PO TABS: 4.0000 mg | ORAL_TABLET | Freq: Three times a day (TID) | ORAL | 2 refills | Status: DC | PRN

## 2019-02-17 MED ORDER — METFORMIN HCL 1000 MG PO TABS: 1000.0000 mg | ORAL_TABLET | Freq: Two times a day (BID) | ORAL | 0 refills | Status: DC

## 2019-02-17 MED ORDER — ATORVASTATIN CALCIUM 10 MG PO TABS: 10.0000 mg | ORAL_TABLET | Freq: Every day | ORAL | 3 refills | Status: DC

## 2019-02-17 MED ORDER — GABAPENTIN 800 MG PO TABS: 800.0000 mg | ORAL_TABLET | Freq: Three times a day (TID) | ORAL | 0 refills | Status: DC

## 2019-02-17 MED ORDER — ALBUTEROL SULFATE (2.5 MG/3ML) 0.083% IN NEBU: 2.5000 mg | INHALATION_SOLUTION | RESPIRATORY_TRACT | 2 refills | Status: DC | PRN

## 2019-02-17 MED ORDER — MECLIZINE HCL 12.5 MG PO TABS: ORAL_TABLET | ORAL | 0 refills | Status: DC

## 2019-02-17 NOTE — Telephone Encounter (Signed)
I was unaware of a need for a new nebulizer. Unsure of what paperwork is needed or where to obtain that. Details of this information would be greatly appreciated. Happy she was able to get a new machine. Thanks for your help Janett Billow.

## 2019-02-17 NOTE — Telephone Encounter (Signed)
Pts daughter calls to figure out what is going on with the nebulizer.  Contacted Jazmin @ [employer] they state that no paperwork was ever received.  Jazmin will allow [place] to give another machine , just to be sure that we completed the paperwork and faxed it back.  Daughter informed and she will be by later to pick up machine. Christen Bame, CMA

## 2019-02-18 NOTE — Telephone Encounter (Signed)
The paper is completed when we give out the nebulizer.  Somehow that paperwork must have gotten lost.   But nothing for you to do :-)

## 2019-02-22 ENCOUNTER — Other Ambulatory Visit: Payer: Self-pay | Admitting: *Deleted

## 2019-02-23 ENCOUNTER — Other Ambulatory Visit: Payer: Self-pay | Admitting: Family Medicine

## 2019-02-23 DIAGNOSIS — G8929 Other chronic pain: Secondary | ICD-10-CM

## 2019-02-25 ENCOUNTER — Other Ambulatory Visit: Payer: Self-pay | Admitting: Family Medicine

## 2019-02-25 DIAGNOSIS — M8580 Other specified disorders of bone density and structure, unspecified site: Secondary | ICD-10-CM

## 2019-02-26 NOTE — Telephone Encounter (Signed)
Spoke to patient in regards to medication refill requests. Patient endorsed taking Lisinopril as she thought I had restarted her on it. No documentation of this or refills. She went through medication bottles at home and could not find a bottle. Instructed her not to take it at this time. Instructed her to take blood pressure at home periodically and call the clinic if >140/90 to discuss restarting Lisinopril 10mg  at that time. Refill not appropriate at this time.  Refill for Mobic and Baclofen provided. Patient notes need for Baclofen 20mg  TID due to muscle cramps in her calves.  Patient notes she was taking Meclizine BID because bottle said "take BID as needed". I instructed her to take it only if she needs it. She notes she probably only needs it once a day and agreed to only take it once a day for now or not at all if not feeling dizzy.   Patient to RTC in 2 months for blood pressure check and further discuss medications. Given her history of AMS goal is to continue to wean medications as able.

## 2019-03-03 ENCOUNTER — Other Ambulatory Visit: Payer: Self-pay | Admitting: Family Medicine

## 2019-03-03 DIAGNOSIS — K219 Gastro-esophageal reflux disease without esophagitis: Secondary | ICD-10-CM

## 2019-03-14 ENCOUNTER — Other Ambulatory Visit: Payer: Self-pay | Admitting: Family Medicine

## 2019-03-14 DIAGNOSIS — E119 Type 2 diabetes mellitus without complications: Secondary | ICD-10-CM

## 2019-03-29 ENCOUNTER — Telehealth: Payer: Self-pay | Admitting: Family Medicine

## 2019-03-29 ENCOUNTER — Other Ambulatory Visit: Payer: Self-pay | Admitting: Family Medicine

## 2019-03-29 DIAGNOSIS — M7989 Other specified soft tissue disorders: Secondary | ICD-10-CM

## 2019-03-29 NOTE — Telephone Encounter (Signed)
Pt already scheduled for an appt. Jamont Mellin, CMA  

## 2019-03-29 NOTE — Telephone Encounter (Signed)
Referral placed. If this is new for patient, then she may want to be seen by me or her cardiologist as well.

## 2019-03-29 NOTE — Telephone Encounter (Signed)
Daughter is calling and would like a referral for her mother to go to [place]. Both of her feet are swollen and she also has very large blisters. Can we put a urgent referral in. jw

## 2019-03-31 ENCOUNTER — Encounter: Payer: Self-pay | Admitting: Podiatry

## 2019-03-31 ENCOUNTER — Ambulatory Visit (INDEPENDENT_AMBULATORY_CARE_PROVIDER_SITE_OTHER): Payer: Medicare Other | Admitting: Podiatry

## 2019-03-31 ENCOUNTER — Ambulatory Visit (INDEPENDENT_AMBULATORY_CARE_PROVIDER_SITE_OTHER): Payer: Medicare Other

## 2019-03-31 ENCOUNTER — Other Ambulatory Visit: Payer: Self-pay

## 2019-03-31 VITALS — BP 126/68 | HR 83 | Resp 16

## 2019-03-31 DIAGNOSIS — S90822A Blister (nonthermal), left foot, initial encounter: Secondary | ICD-10-CM | POA: Diagnosis not present

## 2019-03-31 DIAGNOSIS — M79676 Pain in unspecified toe(s): Secondary | ICD-10-CM

## 2019-03-31 DIAGNOSIS — E1142 Type 2 diabetes mellitus with diabetic polyneuropathy: Secondary | ICD-10-CM

## 2019-03-31 DIAGNOSIS — B351 Tinea unguium: Secondary | ICD-10-CM | POA: Diagnosis not present

## 2019-03-31 MED ORDER — SILVER SULFADIAZINE 1 % EX CREA: 1.0000 "application " | TOPICAL_CREAM | Freq: Every day | CUTANEOUS | 1 refills | Status: DC

## 2019-04-02 ENCOUNTER — Encounter: Payer: Self-pay | Admitting: Podiatry

## 2019-04-02 NOTE — Progress Notes (Signed)
Subjective:  [name], female    DOB: 29-Nov-1955,  MRN: [id number] HPI Chief Complaint  Patient presents with   Diabetes    Diabetic-swelling, tender a times, blister dorsal forefoot left x 1 week, popped, keeping covered with bandaid, last a1c was 8.9   New Patient (Initial Visit)    63 y.o. female presents with the above complaint.   ROS: Denies fever chills nausea vomiting muscle aches pains calf pain back pain chest pain shortness of breath.  Past Medical History:  Diagnosis Date   Allergy    Arthritis    COPD (chronic obstructive pulmonary disease) (HCC)    Diabetes mellitus, type 2 (HCC)    Excessive daytime sleepiness 11/12/2017   GERD (gastroesophageal reflux disease)    Goiter    History of kidney stones    Hypertension    Kidney stone    Memory loss    Mixed Alzheimer's and vascular dementia ([place]) 08/28/2017   Osteoporosis    in lower back per pt    Plantar fasciitis    Snoring 08/28/2017   Stroke ([place])    residual left sided weakness   Tremors of nervous system    Vertigo    Past Surgical History:  Procedure Laterality Date   APPENDECTOMY     BLADDER SURGERY     CATARACT EXTRACTION W/PHACO Left 05/06/2017   Procedure: CATARACT EXTRACTION PHACO AND INTRAOCULAR LENS PLACEMENT ([place]) LEFT DIABETIC;  Surgeon: Leandrew Koyanagi, MD;  Location: [place];  Service: Ophthalmology;  Laterality: Left;  Diabetic - oral meds   CATARACT EXTRACTION W/PHACO Right 06/03/2017   Procedure: CATARACT EXTRACTION PHACO AND INTRAOCULAR LENS PLACEMENT ([place]) RIGHT DIABETIC;  Surgeon: Leandrew Koyanagi, MD;  Location: [place];  Service: Ophthalmology;  Laterality: Right;  Diabetic - oral meds   CHOLECYSTECTOMY     FOOT SURGERY     TOTAL ABDOMINAL HYSTERECTOMY     TOTAL KNEE ARTHROPLASTY Left 09/21/2018   Procedure: TOTAL KNEE ARTHROPLASTY-LEFT NEEDS RNFA;  Surgeon: Hessie Knows, MD;  Location: [facility];  Service:  Orthopedics;  Laterality: Left;    Current Outpatient Medications:    Ascorbic Acid (VITAMIN C PO), Take by mouth., Disp: , Rfl:    aspirin EC 81 MG tablet, Take 81 mg by mouth daily., Disp: , Rfl:    cyclobenzaprine (FLEXERIL) 10 MG tablet, Take 10 mg by mouth 3 (three) times daily as needed for muscle spasms., Disp: , Rfl:    dimenhyDRINATE (DRAMAMINE) 50 MG tablet, Take 50 mg by mouth every 8 (eight) hours as needed., Disp: , Rfl:    triamcinolone (KENALOG) 0.025 % cream, Apply 1 application topically 2 (two) times daily., Disp: , Rfl:    VITAMIN D PO, Take by mouth., Disp: , Rfl:    ACCU-CHEK AVIVA PLUS test strip, USE  TO CHECK BLOOD SUGAR THREE TIMES DAILY, Disp: 300 each, Rfl: 11   ACCU-CHEK SOFTCLIX LANCETS lancets, USE  TO CHECK BLOOD SUGAR THREE TIMES DAILY, Disp: 300 each, Rfl: 11   acetaminophen (TYLENOL) 325 MG tablet, Take 1-2 tablets (325-650 mg total) by mouth every 6 (six) hours as needed for mild pain (pain score 1-3 or temp > 100.5)., Disp: , Rfl:    albuterol (PROVENTIL HFA;VENTOLIN HFA) 108 (90 Base) MCG/ACT inhaler, Inhale 1-2 puffs into the lungs every 6 (six) hours as needed for wheezing., Disp: 2 Inhaler, Rfl: 2   albuterol (PROVENTIL) (2.5 MG/3ML) 0.083% nebulizer solution, Take 3 mLs (2.5 mg total) by nebulization every 4 (  four) hours as needed for wheezing., Disp: 75 mL, Rfl: 2   Alcohol Swabs (B-D SINGLE USE SWABS REGULAR) PADS, USE  TO  CLEAN  SKIN  BEFORE CHECKING BLOOD SUGAR, Disp: 300 each, Rfl: 11   alendronate (FOSAMAX) 70 MG tablet, TAKE 1 TABLET BY MOUTH ONE TIME PER WEEK, Disp: 12 tablet, Rfl: 0   atorvastatin (LIPITOR) 10 MG tablet, Take 1 tablet (10 mg total) by mouth daily., Disp: 90 tablet, Rfl: 3   clotrimazole (LOTRIMIN) 1 % cream, Apply 1 application topically 2 (two) times daily., Disp: 30 g, Rfl: 0   docusate sodium (COLACE) 100 MG capsule, Take 1 capsule (100 mg total) by mouth 2 (two) times daily., Disp: 10 capsule, Rfl: 0    DULoxetine (CYMBALTA) 60 MG capsule, Take 1 capsule (60 mg total) by mouth daily., Disp: 90 capsule, Rfl: 3   gabapentin (NEURONTIN) 800 MG tablet, Take 1 tablet (800 mg total) by mouth 3 (three) times daily., Disp: 270 tablet, Rfl: 0   JANUVIA 100 MG tablet, TAKE 1 TABLET BY MOUTH EVERY DAY IN THE EVENING, Disp: 90 tablet, Rfl: 3   lisinopril (PRINIVIL,ZESTRIL) 10 MG tablet, Take 10 mg by mouth daily., Disp: , Rfl:    meclizine (ANTIVERT) 12.5 MG tablet, TAKE 1 TABLET 2 TIMES DAILY AS NEEDED FOR DIZZINESS., Disp: 180 tablet, Rfl: 0   meloxicam (MOBIC) 15 MG tablet, TAKE 1 TABLET BY MOUTH EVERY DAY, Disp: 30 tablet, Rfl: 2   metFORMIN (GLUCOPHAGE) 1000 MG tablet, Take 1 tablet (1,000 mg total) by mouth 2 (two) times daily with a meal., Disp: 180 tablet, Rfl: 0   Multiple Vitamins-Minerals ([employer] SPECTRAVITE WOMENS SENIOR PO), Take 1 tablet by mouth daily. , Disp: , Rfl:    omeprazole (PRILOSEC) 20 MG capsule, TAKE 1 CAPSULE BY MOUTH EVERY DAY IN THE EVENING, Disp: 90 capsule, Rfl: 0   ondansetron (ZOFRAN) 4 MG tablet, Take 1 tablet (4 mg total) by mouth every 8 (eight) hours as needed for nausea or vomiting., Disp: 30 tablet, Rfl: 2   silver sulfADIAZINE (SILVADENE) 1 % cream, Apply 1 application topically daily., Disp: 50 g, Rfl: 1  Allergies  Allergen Reactions   Hydrocodone-Acetaminophen Nausea And Vomiting   Review of Systems Objective:   Vitals:   03/31/19 1609  BP: 126/68  Pulse: 83  Resp: 16    General: Well developed, nourished, in no acute distress, alert and oriented x3   Dermatological: Skin is warm, dry and supple bilateral. Nails x 10 are well maintained; remaining integument appears unremarkable at this time. There are no open sores, no preulcerative lesions, no rash or signs of infection present.  Bolus measuring about 5 cm in diameter dorsal aspect of the left foot with a serous sanguinous fluid no purulence no malodor was lanced.  Toenails are long thick yellow  dystrophic and clinically mycotic  Vascular: Dorsalis Pedis artery and Posterior Tibial artery pedal pulses are 2/4 bilateral with immedate capillary fill time. Pedal hair growth present. No varicosities and no lower extremity edema present bilateral.   Neruologic: Grossly intact via light touch bilateral. Vibratory intact via tuning fork bilateral. Protective threshold with Semmes Wienstein monofilament intact to all pedal sites bilateral. Patellar and Achilles deep tendon reflexes 2+ bilateral. No Babinski or clonus noted bilateral.   Musculoskeletal: No gross boney pedal deformities bilateral. No pain, crepitus, or limitation noted with foot and ankle range of motion bilateral. Muscular strength 5/5 in all groups tested bilateral.  Gait: Unassisted, Nonantalgic.  Radiographs:  Radiographs taken today demonstrate some osteoarthritic changes of the midfoot osteopenia some plantar and posterior spurring moderate edema in the soft tissues.  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  Encouraged her to do the same daily until the bolus is completely healed.  If any of the bullae form she can lance them allow them to drain and the continue similar treatments.  I expressed to her that getting her blood sugar to an appropriate level would help with this as well also keeping her feet and legs elevated to help with swelling would aid in this as well.  We will get her in with Dr. Elisha Ponder for routine nail debridement.     Deran Barro T. [place], [place]

## 2019-04-04 ENCOUNTER — Ambulatory Visit
Admission: RE | Admit: 2019-04-04 | Discharge: 2019-04-04 | Disposition: A | Discharge status: Home / Self Care | Payer: Medicare Other | Source: Ambulatory Visit | Attending: Orthopedic Surgery | Admitting: Orthopedic Surgery

## 2019-04-04 ENCOUNTER — Other Ambulatory Visit: Payer: Self-pay | Admitting: Orthopedic Surgery

## 2019-04-04 ENCOUNTER — Other Ambulatory Visit: Payer: Self-pay

## 2019-04-04 DIAGNOSIS — S32040A Wedge compression fracture of fourth lumbar vertebra, initial encounter for closed fracture: Secondary | ICD-10-CM

## 2019-04-05 ENCOUNTER — Telehealth: Payer: Self-pay | Admitting: *Deleted

## 2019-04-05 ENCOUNTER — Other Ambulatory Visit: Payer: Self-pay | Admitting: Podiatry

## 2019-04-05 MED ORDER — AMOXICILLIN-POT CLAVULANATE 875-125 MG PO TABS: 1.0000 | ORAL_TABLET | Freq: Two times a day (BID) | ORAL | 0 refills | Status: DC

## 2019-04-05 NOTE — Telephone Encounter (Signed)
Pt's drr, [name] states the area on pt's foot Dr, Milinda Pointer treated 03/31/2019 is red and swollen, and they would like an antibiotic sent to their pharmacy.

## 2019-04-05 NOTE — Telephone Encounter (Signed)
I informed pt's dtr, [name] of Dr. Stephenie Acres orders.

## 2019-04-05 NOTE — Progress Notes (Signed)
aug

## 2019-04-05 NOTE — Telephone Encounter (Signed)
Pt's dtr, [name] states the blister is red surrounding and the toes and foot are red and swollen and they want an antibiotic.

## 2019-04-05 NOTE — Telephone Encounter (Signed)
I sent augmentin

## 2019-04-06 ENCOUNTER — Other Ambulatory Visit: Payer: Self-pay

## 2019-04-06 ENCOUNTER — Other Ambulatory Visit
Admission: RE | Admit: 2019-04-06 | Discharge: 2019-04-06 | Disposition: A | Discharge status: Home / Self Care | Payer: Medicare Other | Source: Ambulatory Visit | Attending: Orthopedic Surgery | Admitting: Orthopedic Surgery

## 2019-04-06 DIAGNOSIS — Z20828 Contact with and (suspected) exposure to other viral communicable diseases: Secondary | ICD-10-CM | POA: Diagnosis present

## 2019-04-06 DIAGNOSIS — Z01812 Encounter for preprocedural laboratory examination: Secondary | ICD-10-CM | POA: Insufficient documentation

## 2019-04-06 LAB — BASIC METABOLIC PANEL
Anion gap: 9 (ref 5–15)
BUN: 15 mg/dL (ref 8–23)
CO2: 26 mmol/L (ref 22–32)
Calcium: 9.1 mg/dL (ref 8.9–10.3)
Chloride: 105 mmol/L (ref 98–111)
Creatinine, Ser: 0.43 mg/dL — ABNORMAL LOW (ref 0.44–1.00)
GFR calc Af Amer: >60 mL/min (ref >60)
GFR calc non Af Amer: >60 mL/min (ref >60)
Glucose, Bld: 209 mg/dL — ABNORMAL HIGH (ref 70–99)
Potassium: 3.3 mmol/L — ABNORMAL LOW (ref 3.5–5.1)
Sodium: 140 mmol/L (ref 135–145)

## 2019-04-06 LAB — CBC
HCT: 34.5 % — ABNORMAL LOW (ref 36.0–46.0)
Hemoglobin: 11.1 g/dL — ABNORMAL LOW (ref 12.0–15.0)
MCH: 30.4 pg (ref 26.0–34.0)
MCHC: 32.2 g/dL (ref 30.0–36.0)
MCV: 94.5 fL (ref 80.0–100.0)
Platelets: 169 10*3/uL (ref 150–400)
RBC: 3.65 MIL/uL — ABNORMAL LOW (ref 3.87–5.11)
RDW: 16.1 % — ABNORMAL HIGH (ref 11.5–15.5)
WBC: 7 10*3/uL (ref 4.0–10.5)
nRBC: 0 % (ref 0.0–0.2)

## 2019-04-06 LAB — SARS CORONAVIRUS 2 (TAT 6-24 HRS): SARS Coronavirus 2: NEGATIVE

## 2019-04-06 MED ORDER — DEXTROSE 5 % IV SOLN
3.0000 g | Freq: Once | INTRAVENOUS | Status: AC
  Administered 2019-04-07: 3 g via INTRAVENOUS
  Filled 2019-04-06: qty 3

## 2019-04-06 NOTE — Patient Instructions (Signed)
Your procedure is scheduled on:  Thursday 04/07/19.  Report to DAY SURGERY DEPARTMENT LOCATED ON 2ND FLOOR MEDICAL MALL ENTRANCE. To find out your arrival time please call [phone] between 1PM - 3PM on Today 04/06/19.   Remember: Instructions that are not followed completely may result in serious medical risk, up to and including death, or upon the discretion of your surgeon and anesthesiologist your surgery may need to be rescheduled.      _X__ 1. Do not eat food after midnight the night before your procedure.                 No gum chewing or hard candies. You may drink clear liquids up to 2 hours                 before you are scheduled to arrive for your surgery- DO NOT drink water                 within 2 hours of the start of your surgery.                    __X__2.  On the morning of surgery brush your teeth with toothpaste and water, you may rinse your mouth with mouthwash if you wish.  Do not swallow any toothpaste or mouthwash.       Do not wear jewelry, make-up, hairpins, clips or nail polish. Do not wear lotions, powders, or perfumes.  Do not shave 48 hours prior to surgery. Men may shave face and neck. Do not bring valuables to the hospital.     [facility] is not responsible for any belongings or valuables.   Contacts, dentures/partials or body piercings may not be worn into surgery. Bring a case for your contacts, glasses or hearing aids, a denture cup will be supplied.    Patients discharged the day of surgery will not be allowed to drive home.    Please read over the following fact sheets that you were given:   MRSA Information   __X__ Take these medicines the morning of surgery with A SIP OF WATER:     1. albuterol (PROVENTIL) (2.5 MG/3ML) 0.083% nebulizer solution  2. amoxicillin-clavulanate (AUGMENTIN) 875-125 MG tablet  3. baclofen (LIORESAL) 20 MG tablet  4. gabapentin (NEURONTIN) 800 MG tablet  5. omeprazole (PRILOSEC) 20 MG capsule  6.  oxycodone (OXY-IR) 5 MG capsule if needed  7. meclizine (ANTIVERT) 12.5 MG tablet if needed     __X__ Use CHG Soap/SAGE wipes as directed   _ X___ Use your nebulizer the morning of surgery.   __X__ Stop Metformin TODAY     __X__ Stop Blood Thinners:  Aspirin TODAY   __X__ Stop Anti-inflammatories 7 days before surgery such as Advil, Ibuprofen, Motrin, BC or Goodies Powder, Naprosyn, Naproxen, Aleve, Aspirin, Meloxicam. May take Tylenol and or Oxycodone if needed for pain or discomfort.

## 2019-04-06 NOTE — Progress Notes (Signed)
Pre-Admit Testing Provider Notification Note  Provider Notified: Dr. Rudene Christians  Notification Mode: Fax  Reason: Abnormal lab results  Response: Fax confirmation received  Additional Information: N/A  Signed: Beulah Gandy, RN

## 2019-04-07 ENCOUNTER — Ambulatory Visit: Payer: Medicare Other | Admitting: Anesthesiology

## 2019-04-07 ENCOUNTER — Other Ambulatory Visit: Payer: Self-pay

## 2019-04-07 ENCOUNTER — Ambulatory Visit
Admission: RE | Admit: 2019-04-07 | Discharge: 2019-04-07 | Disposition: A | Discharge status: Home / Self Care | Payer: Medicare Other | Attending: Orthopedic Surgery | Admitting: Orthopedic Surgery

## 2019-04-07 ENCOUNTER — Encounter: Payer: Self-pay | Admitting: *Deleted

## 2019-04-07 ENCOUNTER — Encounter
Admission: RE | Disposition: A | Discharge status: Home / Self Care | Payer: Self-pay | Source: Home / Self Care | Attending: Orthopedic Surgery

## 2019-04-07 ENCOUNTER — Ambulatory Visit: Payer: Medicare Other

## 2019-04-07 DIAGNOSIS — J449 Chronic obstructive pulmonary disease, unspecified: Secondary | ICD-10-CM | POA: Insufficient documentation

## 2019-04-07 DIAGNOSIS — Z7982 Long term (current) use of aspirin: Secondary | ICD-10-CM | POA: Insufficient documentation

## 2019-04-07 DIAGNOSIS — E119 Type 2 diabetes mellitus without complications: Secondary | ICD-10-CM | POA: Diagnosis not present

## 2019-04-07 DIAGNOSIS — Z9849 Cataract extraction status, unspecified eye: Secondary | ICD-10-CM | POA: Insufficient documentation

## 2019-04-07 DIAGNOSIS — Z7984 Long term (current) use of oral hypoglycemic drugs: Secondary | ICD-10-CM | POA: Insufficient documentation

## 2019-04-07 DIAGNOSIS — Z6839 Body mass index (BMI) 39.0-39.9, adult: Secondary | ICD-10-CM | POA: Diagnosis not present

## 2019-04-07 DIAGNOSIS — Z885 Allergy status to narcotic agent status: Secondary | ICD-10-CM | POA: Insufficient documentation

## 2019-04-07 DIAGNOSIS — Z79899 Other long term (current) drug therapy: Secondary | ICD-10-CM | POA: Diagnosis not present

## 2019-04-07 DIAGNOSIS — K219 Gastro-esophageal reflux disease without esophagitis: Secondary | ICD-10-CM | POA: Diagnosis not present

## 2019-04-07 DIAGNOSIS — Z96652 Presence of left artificial knee joint: Secondary | ICD-10-CM | POA: Diagnosis not present

## 2019-04-07 DIAGNOSIS — W19XXXA Unspecified fall, initial encounter: Secondary | ICD-10-CM | POA: Insufficient documentation

## 2019-04-07 DIAGNOSIS — Z419 Encounter for procedure for purposes other than remedying health state, unspecified: Secondary | ICD-10-CM

## 2019-04-07 DIAGNOSIS — I10 Essential (primary) hypertension: Secondary | ICD-10-CM | POA: Diagnosis not present

## 2019-04-07 DIAGNOSIS — S32040A Wedge compression fracture of fourth lumbar vertebra, initial encounter for closed fracture: Secondary | ICD-10-CM | POA: Diagnosis not present

## 2019-04-07 HISTORY — PX: KYPHOPLASTY: SHX5884

## 2019-04-07 LAB — POCT I-STAT, CHEM 8
BUN: 12 mg/dL (ref 8–23)
Calcium, Ion: 1.18 mmol/L (ref 1.15–1.40)
Chloride: 101 mmol/L (ref 98–111)
Creatinine, Ser: 0.3 mg/dL — ABNORMAL LOW (ref 0.44–1.00)
Glucose, Bld: 132 mg/dL — ABNORMAL HIGH (ref 70–99)
HCT: 35 % — ABNORMAL LOW (ref 36.0–46.0)
Hemoglobin: 11.9 g/dL — ABNORMAL LOW (ref 12.0–15.0)
Potassium: 3.9 mmol/L (ref 3.5–5.1)
Sodium: 140 mmol/L (ref 135–145)
TCO2: 26 mmol/L (ref 22–32)

## 2019-04-07 LAB — GLUCOSE, CAPILLARY: Glucose-Capillary: 114 mg/dL — ABNORMAL HIGH (ref 70–99)

## 2019-04-07 SURGERY — KYPHOPLASTY
Anesthesia: General | Site: Back

## 2019-04-07 MED ORDER — OXYCODONE HCL 5 MG PO TABS
5.0000 mg | ORAL_TABLET | ORAL | Status: DC | PRN
  Administered 2019-04-07: 15:00:00 5 mg via ORAL

## 2019-04-07 MED ORDER — OXYCODONE HCL 5 MG PO TABS: 5.0000 mg | ORAL_TABLET | Freq: Three times a day (TID) | ORAL | 0 refills | Status: DC | PRN

## 2019-04-07 MED ORDER — LIDOCAINE HCL 1 % IJ SOLN
INTRAMUSCULAR | Status: DC | PRN
  Administered 2019-04-07 (×2): 20 mL

## 2019-04-07 MED ORDER — FENTANYL CITRATE (PF) 100 MCG/2ML IJ SOLN
INTRAMUSCULAR | Status: AC
  Filled 2019-04-07: qty 2

## 2019-04-07 MED ORDER — SODIUM CHLORIDE 0.9 % IV SOLN: INTRAVENOUS | Status: DC

## 2019-04-07 MED ORDER — HYDROMORPHONE HCL 1 MG/ML IJ SOLN: 0.5000 mg | INTRAMUSCULAR | Status: DC | PRN

## 2019-04-07 MED ORDER — FENTANYL CITRATE (PF) 100 MCG/2ML IJ SOLN
INTRAMUSCULAR | Status: DC | PRN
  Administered 2019-04-07 (×4): 50 ug via INTRAVENOUS

## 2019-04-07 MED ORDER — MIDAZOLAM HCL 2 MG/2ML IJ SOLN
INTRAMUSCULAR | Status: AC
  Filled 2019-04-07: qty 2

## 2019-04-07 MED ORDER — METOCLOPRAMIDE HCL 10 MG PO TABS: 5.0000 mg | ORAL_TABLET | Freq: Three times a day (TID) | ORAL | Status: DC | PRN

## 2019-04-07 MED ORDER — BUPIVACAINE-EPINEPHRINE (PF) 0.5% -1:200000 IJ SOLN
INTRAMUSCULAR | Status: DC | PRN
  Administered 2019-04-07: 20 mL

## 2019-04-07 MED ORDER — FENTANYL CITRATE (PF) 100 MCG/2ML IJ SOLN
25.0000 ug | INTRAMUSCULAR | Status: DC | PRN
  Administered 2019-04-07: 25 ug via INTRAVENOUS
  Administered 2019-04-07: 50 ug via INTRAVENOUS
  Administered 2019-04-07: 14:00:00 25 ug via INTRAVENOUS

## 2019-04-07 MED ORDER — DEXMEDETOMIDINE HCL 200 MCG/2ML IV SOLN
INTRAVENOUS | Status: DC | PRN
  Administered 2019-04-07 (×2): 4 ug via INTRAVENOUS

## 2019-04-07 MED ORDER — ACETAMINOPHEN 325 MG PO TABS: 325.0000 mg | ORAL_TABLET | Freq: Four times a day (QID) | ORAL | Status: DC | PRN

## 2019-04-07 MED ORDER — KETAMINE HCL 10 MG/ML IJ SOLN
INTRAMUSCULAR | Status: DC | PRN
  Administered 2019-04-07 (×5): 10 mg via INTRAVENOUS

## 2019-04-07 MED ORDER — SODIUM CHLORIDE 0.9 % IV SOLN
INTRAVENOUS | Status: DC
  Administered 2019-04-07: 12:00:00 via INTRAVENOUS

## 2019-04-07 MED ORDER — MIDAZOLAM HCL 2 MG/2ML IJ SOLN
INTRAMUSCULAR | Status: DC | PRN
  Administered 2019-04-07 (×2): 0.5 mg via INTRAVENOUS

## 2019-04-07 MED ORDER — OXYCODONE HCL 5 MG PO TABS
ORAL_TABLET | ORAL | Status: AC
  Filled 2019-04-07: qty 1

## 2019-04-07 MED ORDER — KETAMINE HCL 50 MG/ML IJ SOLN
INTRAMUSCULAR | Status: AC
  Filled 2019-04-07: qty 10

## 2019-04-07 MED ORDER — METOCLOPRAMIDE HCL 5 MG/ML IJ SOLN: 5.0000 mg | Freq: Three times a day (TID) | INTRAMUSCULAR | Status: DC | PRN

## 2019-04-07 MED ORDER — ONDANSETRON HCL 4 MG/2ML IJ SOLN: 4.0000 mg | Freq: Four times a day (QID) | INTRAMUSCULAR | Status: DC | PRN

## 2019-04-07 MED ORDER — PROPOFOL 10 MG/ML IV BOLUS
INTRAVENOUS | Status: DC | PRN
  Administered 2019-04-07 (×2): 10 mg via INTRAVENOUS
  Administered 2019-04-07 (×2): 5 mg via INTRAVENOUS

## 2019-04-07 MED ORDER — IOHEXOL 180 MG/ML  SOLN
INTRAMUSCULAR | Status: DC | PRN
  Administered 2019-04-07: 20 mL

## 2019-04-07 MED ORDER — ONDANSETRON HCL 4 MG PO TABS: 4.0000 mg | ORAL_TABLET | Freq: Four times a day (QID) | ORAL | Status: DC | PRN

## 2019-04-07 MED ORDER — ONDANSETRON HCL 4 MG/2ML IJ SOLN
INTRAMUSCULAR | Status: DC | PRN
  Administered 2019-04-07: 4 mg via INTRAVENOUS

## 2019-04-07 MED ORDER — ACETAMINOPHEN 500 MG PO TABS: 1000.0000 mg | ORAL_TABLET | Freq: Four times a day (QID) | ORAL | Status: DC

## 2019-04-07 MED ORDER — OXYCODONE HCL 5 MG PO TABS: 10.0000 mg | ORAL_TABLET | ORAL | Status: DC | PRN

## 2019-04-07 MED ORDER — PROPOFOL 10 MG/ML IV BOLUS
INTRAVENOUS | Status: AC
  Filled 2019-04-07: qty 20

## 2019-04-07 SURGICAL SUPPLY — 19 items
CEMENT KYPHON CX01A KIT/MIXER (Cement) ×2 IMPLANT
COVER WAND RF STERILE (DRAPES) ×2 IMPLANT
DERMABOND ADVANCED (GAUZE/BANDAGES/DRESSINGS) ×1
DERMABOND ADVANCED .7 DNX12 (GAUZE/BANDAGES/DRESSINGS) ×1 IMPLANT
DEVICE BIOPSY BONE KYPHX (INSTRUMENTS) ×4 IMPLANT
DRAPE C-ARM XRAY 36X54 (DRAPES) ×2 IMPLANT
DURAPREP 26ML APPLICATOR (WOUND CARE) ×2 IMPLANT
FEE RENTAL RFA GENERATOR (MISCELLANEOUS) IMPLANT
GLOVE SURG SYN 9.0  PF PI (GLOVE) ×1
GLOVE SURG SYN 9.0 PF PI (GLOVE) ×1 IMPLANT
GOWN SRG 2XL LVL 4 RGLN SLV (GOWNS) ×1 IMPLANT
GOWN STRL NON-REIN 2XL LVL4 (GOWNS) ×1
GOWN STRL REUS W/ TWL LRG LVL3 (GOWN DISPOSABLE) ×1 IMPLANT
GOWN STRL REUS W/TWL LRG LVL3 (GOWN DISPOSABLE) ×1
PACK KYPHOPLASTY (MISCELLANEOUS) ×2 IMPLANT
RENTAL RFA GENERATOR (MISCELLANEOUS) IMPLANT
STRAP SAFETY 5IN WIDE (MISCELLANEOUS) ×2 IMPLANT
TRAY KYPHOPAK 15/3 EXPRESS 1ST (MISCELLANEOUS) ×1 IMPLANT
TRAY KYPHOPAK 20/3 EXPRESS 1ST (MISCELLANEOUS) ×2 IMPLANT

## 2019-04-07 NOTE — H&P (Signed)
Reviewed paper H+P, will be scanned into chart. No changes noted.  

## 2019-04-07 NOTE — Anesthesia Post-op Follow-up Note (Signed)
Anesthesia QCDR form completed.        

## 2019-04-07 NOTE — Anesthesia Postprocedure Evaluation (Signed)
Anesthesia Post Note  Patient: [name]  Procedure(s) Performed: L4 KYPHOPLASTY (N/A Back)  Patient location during evaluation: PACU Anesthesia Type: General Level of consciousness: awake and alert Pain management: pain level controlled Vital Signs Assessment: post-procedure vital signs reviewed and stable Respiratory status: spontaneous breathing, nonlabored ventilation, respiratory function stable and patient connected to nasal cannula oxygen Cardiovascular status: blood pressure returned to baseline and stable Postop Assessment: no apparent nausea or vomiting Anesthetic complications: no     Last Vitals:  Vitals:   04/07/19 1355 04/07/19 1402  BP:  117/69  Pulse: 86 86  Resp: 15 11  Temp:    SpO2: 100% 99%    Last Pain:  Vitals:   04/07/19 1402  TempSrc:   PainSc: Asleep                 Martha Clan

## 2019-04-07 NOTE — Anesthesia Preprocedure Evaluation (Addendum)
Anesthesia Evaluation  Patient identified by MRN, date of birth, ID band Patient awake    Reviewed: Allergy & Precautions, H&P , NPO status , Patient's Chart, lab work & pertinent test results  History of Anesthesia Complications Negative for: history of anesthetic complications  Airway Mallampati: III  TM Distance: <3 FB Neck ROM: limited    Dental  (+) Chipped, Poor Dentition, Missing   Pulmonary shortness of breath and with exertion, COPD, neg recent URI,           Cardiovascular Exercise Tolerance: Good hypertension, (-) angina(-) Past MI and (-) Cardiac Stents (-) dysrhythmias (-) Valvular Problems/Murmurs     Neuro/Psych neg Seizures PSYCHIATRIC DISORDERS Dementia  Neuromuscular disease CVA, Residual Symptoms    GI/Hepatic Neg liver ROS, GERD  Medicated and Controlled,  Endo/Other  diabetes, Type 2, Oral Hypoglycemic AgentsMorbid obesity  Renal/GU Renal disease     Musculoskeletal  (+) Arthritis ,   Abdominal   Peds  Hematology negative hematology ROS (+)   Anesthesia Other Findings Past Medical History: No date: Allergy No date: Arthritis No date: COPD (chronic obstructive pulmonary disease) (HCC) No date: Diabetes mellitus, type 2 ([place]) 11/12/2017: Excessive daytime sleepiness No date: GERD (gastroesophageal reflux disease) No date: Goiter No date: History of kidney stones No date: Hypertension No date: Kidney stone No date: Memory loss 08/28/2017: Mixed Alzheimer's and vascular dementia ([place]) No date: Osteoporosis     Comment:  in lower back per pt  No date: Plantar fasciitis 08/28/2017: Snoring No date: Stroke [facility])     Comment:  residual left sided weakness No date: Tremors of nervous system No date: Vertigo  Past Surgical History: No date: APPENDECTOMY No date: BLADDER SURGERY 05/06/2017: CATARACT EXTRACTION W/PHACO; Left     Comment:  Procedure: CATARACT EXTRACTION PHACO AND INTRAOCULAR                LENS PLACEMENT ([place]) LEFT DIABETIC;  Surgeon: Leandrew Koyanagi, MD;  Location: [place];  Service:               Ophthalmology;  Laterality: Left;  Diabetic - oral meds 06/03/2017: CATARACT EXTRACTION W/PHACO; Right     Comment:  Procedure: CATARACT EXTRACTION PHACO AND INTRAOCULAR               LENS PLACEMENT ([place]) RIGHT DIABETIC;  Surgeon:               Leandrew Koyanagi, MD;  Location: [place];              Service: Ophthalmology;  Laterality: Right;  Diabetic -               oral meds No date: CHOLECYSTECTOMY No date: FOOT SURGERY No date: TOTAL ABDOMINAL HYSTERECTOMY     Reproductive/Obstetrics negative OB ROS                             Anesthesia Physical  Anesthesia Plan  ASA: III  Anesthesia Plan: General   Post-op Pain Management:    Induction: Intravenous  PONV Risk Score and Plan: 3 and Propofol infusion and TIVA  Airway Management Planned: Natural Airway and Nasal Cannula  Additional Equipment:   Intra-op Plan:   Post-operative Plan:   Informed Consent: I have reviewed the patients History and Physical, chart, labs and discussed the procedure including the risks, benefits and alternatives  for the proposed anesthesia with the patient or authorized representative who has indicated his/her understanding and acceptance.     Dental Advisory Given  Plan Discussed with: Anesthesiologist, CRNA and Surgeon  Anesthesia Plan Comments: (Patient consented for risks of anesthesia including but not limited to:  - adverse reactions to medications - risk of bleeding, infection, nerve damage and headache - risk of failed spinal - damage to teeth, lips or other oral mucosa - sore throat or hoarseness - Damage to heart, brain, lungs or loss of life  Patient voiced understanding.)       Anesthesia Quick Evaluation

## 2019-04-07 NOTE — Discharge Instructions (Signed)
°  Band aids off Saturday. Take it easy today and tomorrow. Slowly increase activity as tolerated starting Saturday. Ok to shower Saturday. Pain medication has been sent if she needs it.   AMBULATORY SURGERY  DISCHARGE INSTRUCTIONS   1) The drugs that you were given will stay in your system until tomorrow so for the next 24 hours you should not:  A) Drive an automobile B) Make any legal decisions C) Drink any alcoholic beverage   2) You may resume regular meals tomorrow.  Today it is better to start with liquids and gradually work up to solid foods.  You may eat anything you prefer, but it is better to start with liquids, then soup and crackers, and gradually work up to solid foods.   3) Please notify your doctor immediately if you have any unusual bleeding, trouble breathing, redness and pain at the surgery site, drainage, fever, or pain not relieved by medication.    4) Additional Instructions:        Please contact your physician with any problems or Same Day Surgery at [phone], Monday through Friday 6 am to 4 pm, or [place] at [facility] number at [phone].

## 2019-04-07 NOTE — Transfer of Care (Signed)
Immediate Anesthesia Transfer of Care Note  Patient: [name]  Procedure(s) Performed: L4 KYPHOPLASTY (N/A Back)  Patient Location: PACU  Anesthesia Type:MAC  Level of Consciousness: oriented and drowsy  Airway & Oxygen Therapy: Patient Spontanous Breathing and Patient connected to nasal cannula oxygen  Post-op Assessment: Report given to RN and Post -op Vital signs reviewed and stable  Post vital signs: Reviewed and stable  Last Vitals:  Vitals Value Taken Time  BP 139/72 04/07/19 1332  Temp 36.9 C 04/07/19 1331  Pulse 96 04/07/19 1333  Resp 15 04/07/19 1333  SpO2 99 % 04/07/19 1333  Vitals shown include unvalidated device data.  Last Pain:  Vitals:   04/07/19 1058  TempSrc: Tympanic  PainSc: 10-Worst pain ever         Complications: No apparent anesthesia complications

## 2019-04-07 NOTE — Op Note (Signed)
Date 04/07/2019  time 1:38 PM   PATIENT:  [name]   PRE-OPERATIVE DIAGNOSIS:  closed wedge compression fracture of L4   POST-OPERATIVE DIAGNOSIS:  closed wedge compression fracture of L4   PROCEDURE:  Procedure(s): KYPHOPLASTY L4  SURGEON: Laurene Footman, MD   ASSISTANTS: None   ANESTHESIA:   local and MAC   EBL:  No intake/output data recorded.   BLOOD ADMINISTERED:none   DRAINS: none    LOCAL MEDICATIONS USED:  MARCAINE    and XYLOCAINE    SPECIMEN:   L4 vertebral body biopsy   DISPOSITION OF SPECIMEN:  Pathology   COUNTS:  YES   TOURNIQUET:  * No tourniquets in log *   IMPLANTS: Bone cement   DICTATION: .Dragon Dictation  patient was brought to the operating room and after adequate anesthesia was obtained the patient was placed prone.  C arm was brought in in good visualization of the affected level obtained on both AP and lateral projections.  After patient identification and timeout procedures were completed, local anesthetic was infiltrated with 10 cc 1% Xylocaine infiltrated subcutaneously.  This is done the area on the both sides of the planned approach.  The back was then prepped and draped in the usual sterile manner and repeat timeout procedure carried out.  A spinal needle was brought down to the pedicle on the each side of  L4 and a 50-50 mix of 1% Xylocaine half percent Sensorcaine with epinephrine total of 20 cc injected.  After allowing this to set a small incision was made and the trocar was advanced into the vertebral body in an extrapedicular fashion.  Biopsy was obtained Drilling was carried out balloon inserted.  The left size with this was then approached in a similar fashion.  With inflation to  9 and half cc total there is good correction of the kyphotic deformity.  When the cement was appropriate consistency 10 cc were injected into the vertebral body without extravasation, good fill superior to inferior endplates and from right to left sides along  the inferior endplate.  After the cement had set the trochar was removed and permanent C-arm views obtained.  The wound was closed with Dermabond followed by Band-Aid   PLAN OF CARE: Discharge to home after PACU   PATIENT DISPOSITION:  PACU - hemodynamically stable.

## 2019-04-11 LAB — SURGICAL PATHOLOGY

## 2019-04-22 ENCOUNTER — Encounter: Payer: Self-pay | Admitting: Family Medicine

## 2019-04-22 ENCOUNTER — Ambulatory Visit (INDEPENDENT_AMBULATORY_CARE_PROVIDER_SITE_OTHER): Payer: Medicare Other | Admitting: Family Medicine

## 2019-04-22 ENCOUNTER — Other Ambulatory Visit: Payer: Self-pay

## 2019-04-22 VITALS — BP 128/62 | HR 94 | Wt 231.6 lb

## 2019-04-22 DIAGNOSIS — Z23 Encounter for immunization: Secondary | ICD-10-CM

## 2019-04-22 DIAGNOSIS — Z1211 Encounter for screening for malignant neoplasm of colon: Secondary | ICD-10-CM

## 2019-04-22 DIAGNOSIS — Z1231 Encounter for screening mammogram for malignant neoplasm of breast: Secondary | ICD-10-CM

## 2019-04-22 DIAGNOSIS — E119 Type 2 diabetes mellitus without complications: Secondary | ICD-10-CM

## 2019-04-22 LAB — POCT GLYCOSYLATED HEMOGLOBIN (HGB A1C): HbA1c, POC (controlled diabetic range): 7.3 % — AB (ref 0.0–7.0)

## 2019-04-22 NOTE — Progress Notes (Signed)
   Subjective:   [name]    DOB: Dec 12, 1955, 63 y.o. female   MRN: [id number]  JACOYA CRESSY is a 63 y.o. female here for diabetes follow up.  T2DM: Last A1C 8.9, A1C today 7.3. Currently taking Metformin 1000mg  BID and Januvia 100mg  QD. Endorses compliance. Denies any polyuria, polydipsia, polyphagia, or hypoglycemic symptoms. She plans to make an eye appointment with her eye doctor. Most recent BMP in September showed great kidney function.  Health Maintenance: Due for flu vaccine, mammogram, diabetic eye exam, colonoscopy  Review of Systems:  Per HPI.   [place], medications and smoking status reviewed.  Objective:   BP 128/62   Pulse 94   Wt 231 lb 9.6 oz (105.1 kg)   SpO2 95%   BMI 38.54 kg/m  Vitals and nursing note reviewed.  General: well nourished, well developed, in no acute distress with non-toxic appearance, sitting up comfortably in exam chair CV: regular rate and rhythm without murmurs, rubs, or gallops, no lower extremity edema Lungs: clear to auscultation bilaterally with normal work of breathing Abdomen: soft, non-tender, non-distended, normoactive bowel sounds Skin: warm, dry Extremities: warm and well perfused Neuro: Alert and oriented, speech normal  Assessment & Plan:   Diabetes mellitus type 2, uncomplicated Improved. A1C down from 8.9 to 7.3. Tolerating Metformin and Januvia well.  - continue current regimen  - RTC 3-6 months for repeat A1C - consider starting Jardiance if additional agent is needed - Will plan to obtain Urine Microalbumin:Cr ratio at follow up visit  Health Maintenance: - declined Flu shot - History of colonic adenoma with recommendation to have colonoscopy in 3 years. She is due for colonoscopy this year. Order placed today. Patient instructed to call and schedule at earliest convenience. - Due for mammogram. Order placed and patient instructed to call to schedule appointment - patient to schedule appointment with  eye doctor at earliest convenience  Orders Placed This Encounter  Procedures  . MM Digital Screening    Standing Status:   Future    Standing Expiration Date:   06/21/2020    Order Specific Question:   Reason for Exam (SYMPTOM  OR DIAGNOSIS REQUIRED)    Answer:   breast cancer screeing    Order Specific Question:   Preferred imaging location?    Answer:   [facility]  . CT VIRTUAL COLONOSCOPY SCREENING    Standing Status:   Future    Standing Expiration Date:   07/22/2020    Order Specific Question:   Preferred imaging location?    Answer:   [id number]    Order Specific Question:   Radiology Contrast Protocol - do NOT remove file path    Answer:   [email or web address]  . HgB A1c    Mina Marble, DO PGY-2, [facility] 04/23/2019 10:53 PM

## 2019-04-22 NOTE — Patient Instructions (Signed)
Thanks so much for coming in to see me today!   Your diabetes is doing so much better!! So proud of you! Your hemoglobin A1C went from 8.9 to 7.3!!  Lets continue what we are doing for now with the Metformin and Januvia. Lets plan to see each other in 6 months for a follow up for your diabetes. Of course, if you need to see me sooner you may!  Take care, Dr. Tarry Kos

## 2019-04-23 NOTE — Assessment & Plan Note (Addendum)
Improved. A1C down from 8.9 to 7.3. Tolerating Metformin and Januvia well.  - continue current regimen  - RTC 3-6 months for repeat A1C - consider starting Jardiance if additional agent is needed - Will plan to obtain Urine Microalbumin:Cr ratio at follow up visit

## 2019-04-25 DIAGNOSIS — Z23 Encounter for immunization: Secondary | ICD-10-CM | POA: Diagnosis not present

## 2019-04-26 ENCOUNTER — Encounter: Payer: Self-pay | Admitting: Gastroenterology

## 2019-05-10 ENCOUNTER — Other Ambulatory Visit: Payer: Self-pay | Admitting: Orthopedic Surgery

## 2019-05-10 ENCOUNTER — Ambulatory Visit (INDEPENDENT_AMBULATORY_CARE_PROVIDER_SITE_OTHER): Payer: Medicare Other | Admitting: Sports Medicine

## 2019-05-10 ENCOUNTER — Other Ambulatory Visit: Payer: Self-pay

## 2019-05-10 ENCOUNTER — Encounter: Payer: Self-pay | Admitting: Sports Medicine

## 2019-05-10 ENCOUNTER — Telehealth: Payer: Self-pay | Admitting: *Deleted

## 2019-05-10 DIAGNOSIS — L03116 Cellulitis of left lower limb: Secondary | ICD-10-CM | POA: Diagnosis not present

## 2019-05-10 DIAGNOSIS — M47816 Spondylosis without myelopathy or radiculopathy, lumbar region: Secondary | ICD-10-CM

## 2019-05-10 DIAGNOSIS — S32050A Wedge compression fracture of fifth lumbar vertebra, initial encounter for closed fracture: Secondary | ICD-10-CM

## 2019-05-10 DIAGNOSIS — Z9889 Other specified postprocedural states: Secondary | ICD-10-CM

## 2019-05-10 DIAGNOSIS — I739 Peripheral vascular disease, unspecified: Secondary | ICD-10-CM | POA: Diagnosis not present

## 2019-05-10 DIAGNOSIS — E1141 Type 2 diabetes mellitus with diabetic mononeuropathy: Secondary | ICD-10-CM | POA: Diagnosis not present

## 2019-05-10 MED ORDER — AMOXICILLIN-POT CLAVULANATE 875-125 MG PO TABS: 1.0000 | ORAL_TABLET | Freq: Two times a day (BID) | ORAL | 0 refills | Status: DC

## 2019-05-10 NOTE — Telephone Encounter (Signed)
-----   Message from Landis Martins, [place] sent at 05/10/2019 12:33 PM EDT ----- Venous reflux studies

## 2019-05-10 NOTE — Progress Notes (Signed)
Subjective: [name] is a 63 y.o. female patient who presents to office for evaluation of bilateral swelling reports that she did have a blister on the left foot but now that is much better reports that she is concerned because she had redness all the way up to the knee was worse a few days ago but now slowly has been getting better and reports that there is an intense burning pain on both feet and legs reports history of back problems and has to have more injections and a possible another procedure on her back.  Patient reports that she has tried elevating and has had an episode where her legs have swollen up before but never this bad especially on the left.  Patient denies nausea vomiting fever chills.  Denies injury/trip/fall/sprain/any causative factors.   Patient Active Problem List   Diagnosis Date Noted  . Healthcare maintenance 04/16/2018  . Mixed Alzheimer's and vascular dementia ([place]) 08/28/2017  . Abnormal TSH 08/28/2017  . Memory loss 08/10/2017  . Urge incontinence 07/25/2016  . Impaired functional mobility, balance, and endurance 07/24/2016  . Falls 05/23/2016  . 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 Dyslipidemia associated with type 2 diabetes mellitus ([place]) 04/26/2015  . Benign paroxysmal positional vertigo 01/24/2015  . Diabetes mellitus type 2, uncomplicated ([place]) 99991111  . Vitamin D deficiency 07/11/2013  . Mood disorder ([place]) 07/11/2013  . Osteopenia 11/04/2012  . Atony of bladder 06/10/2010  . Uterovaginal prolapse, incomplete 06/10/2010  . VAGINITIS, ATROPHIC 05/17/2010  . 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 Chronic pain syndrome 04/26/2010  . Morbid obesity ([place]) 09/17/2006  . GASTROESOPHAGEAL REFLUX, NO ESOPHAGITIS 09/17/2006    Current Outpatient Medications on File Prior to Visit  Medication Sig Dispense Refill  . ACCU-CHEK AVIVA PLUS test strip USE  TO CHECK BLOOD SUGAR THREE TIMES  DAILY 300 each 11  . ACCU-CHEK SOFTCLIX LANCETS lancets USE  TO CHECK BLOOD SUGAR THREE TIMES DAILY 300 each 11  . albuterol (PROVENTIL HFA;VENTOLIN HFA) 108 (90 Base) MCG/ACT inhaler Inhale 1-2 puffs into the lungs every 6 (six) hours as needed for wheezing. 2 Inhaler 2  . albuterol (PROVENTIL) (2.5 MG/3ML) 0.083% nebulizer solution Take 3 mLs (2.5 mg total) by nebulization every 4 (four) hours as needed for wheezing. 75 mL 2  . Alcohol Swabs (B-D SINGLE USE SWABS REGULAR) PADS USE  TO  CLEAN  SKIN  BEFORE CHECKING BLOOD SUGAR 300 each 11  . alendronate (FOSAMAX) 70 MG tablet TAKE 1 TABLET BY MOUTH ONE TIME PER WEEK (Patient taking differently: Take 70 mg by mouth every Sunday. ) 12 tablet 0  . aspirin EC 81 MG tablet Take 81 mg by mouth daily.    Marland Kitchen atorvastatin (LIPITOR) 10 MG tablet Take 1 tablet (10 mg total) by mouth daily. 90 tablet 3  . baclofen (LIORESAL) 20 MG tablet Take 20 mg by mouth 3 (three) times daily.    . Calcium Carbonate-Vitamin D (CALCIUM-D PO) Take 1 tablet by mouth daily.    Marland Kitchen docusate sodium (COLACE) 250 MG capsule Take 250 mg by mouth daily.    . DULoxetine (CYMBALTA) 60 MG capsule Take 1 capsule (60 mg total) by mouth daily. 90 capsule 3  . gabapentin (NEURONTIN) 800 MG tablet Take 1 tablet (800 mg total) by mouth 3 (three) times daily. 270 tablet 0  . JANUVIA 100 MG tablet TAKE 1 TABLET BY MOUTH EVERY DAY IN THE EVENING (Patient taking differently: Take  100 mg by mouth every evening. ) 90 tablet 3  . loratadine (CLARITIN) 10 MG tablet Take 10 mg by mouth daily.    . meclizine (ANTIVERT) 12.5 MG tablet TAKE 1 TABLET 2 TIMES DAILY AS NEEDED FOR DIZZINESS. (Patient taking differently: Take 12.5 mg by mouth 3 (three) times daily. ) 180 tablet 0  . meloxicam (MOBIC) 15 MG tablet TAKE 1 TABLET BY MOUTH EVERY DAY (Patient taking differently: Take 15 mg by mouth daily. ) 30 tablet 2  . metFORMIN (GLUCOPHAGE) 1000 MG tablet Take 1 tablet (1,000 mg total) by mouth 2 (two) times  daily with a meal. 180 tablet 0  . Multiple Vitamins-Minerals ([employer] SPECTRAVITE WOMENS SENIOR PO) Take 1 tablet by mouth daily.     Marland Kitchen omeprazole (PRILOSEC) 20 MG capsule TAKE 1 CAPSULE BY MOUTH EVERY DAY IN THE EVENING (Patient taking differently: Take 20 mg by mouth every evening. ) 90 capsule 0  . ondansetron (ZOFRAN) 4 MG tablet Take 1 tablet (4 mg total) by mouth every 8 (eight) hours as needed for nausea or vomiting. (Patient taking differently: Take 4 mg by mouth 3 (three) times daily. ) 30 tablet 2  . oxycodone (OXY-IR) 5 MG capsule Take 5 mg by mouth every 4 (four) hours as needed.    Marland Kitchen oxyCODONE (ROXICODONE) 5 MG immediate release tablet Take 1 tablet (5 mg total) by mouth every 8 (eight) hours as needed. 20 tablet 0  . psyllium (METAMUCIL) 58.6 % powder Take 1 packet by mouth daily as needed (constipation).    . silver sulfADIAZINE (SILVADENE) 1 % cream Apply 1 application topically daily. (Patient taking differently: Apply 1 application topically 2 (two) times daily. ) 50 g 1  . triamcinolone (KENALOG) 0.025 % cream Apply 1 application topically 3 (three) times daily as needed (rash).      No current facility-administered medications on file prior to visit.     Allergies  Allergen Reactions  . Hydrocodone-Acetaminophen Nausea And Vomiting    Objective:  General: Alert and oriented x3 in no acute distress  Dermatology: Previous left foot blister well-healed.  No open lesions bilateral lower extremities, no webspace macerations, no ecchymosis bilateral, all nails x 10 are well manicured.  Faint red discoloration bilateral possible infectious versus venous reflux hyperpigmentation.  Vascular: Dorsalis Pedis and Posterior Tibial pedal pulses palpable faintly, Capillary Fill Time 5 seconds,scant pedal hair growth bilateral, 1+ pitting  edema bilateral lower extremities, Temperature gradient within normal limits with slight increase on the left leg compared to the right.  Neurology:  Johney Maine sensation intact via light touch bilateral, protective sensation intact however oratory diminished bilateral.  Musculoskeletal: No specific tenderness bilateral however diffusely there is pain to legs with increased subjective burning pain to feet feels like she is walking on bubbles.  No pain to palpation to the calves.  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 ([place])           -Complete examination performed -Discussed treatment options for PVD versus cellulitis -Applied [place]na boots bilateral for patient to keep intact for at least 5 days once removes them may apply pressure in sleeves -Prescribed Augmentin for protective measures -Ordered  venous reflux study to evaluate veins to see if patient needs long-term compression garments to control edema which she has on both legs -Continue with rest and elevation -Advised patient that her nerve symptoms are likely coming from her back or her history of diabetes to follow-up with prescribing doctor of gabapentin for medication adjustments if needed -Patient to return to office after venous studies or sooner if condition worsens.  Landis Martins, DPM

## 2019-05-11 ENCOUNTER — Telehealth (HOSPITAL_COMMUNITY): Payer: Self-pay | Admitting: *Deleted

## 2019-05-11 NOTE — Telephone Encounter (Signed)
Faxed required form, clinicals and demographics to [facility]. 

## 2019-05-11 NOTE — Telephone Encounter (Signed)
-----   Message from Landis Martins, [place] sent at 05/10/2019 12:33 PM EDT ----- Venous reflux studies

## 2019-05-11 NOTE — Telephone Encounter (Signed)
Left VM asking for return call

## 2019-05-12 ENCOUNTER — Other Ambulatory Visit: Payer: Self-pay | Admitting: Family Medicine

## 2019-05-12 DIAGNOSIS — E119 Type 2 diabetes mellitus without complications: Secondary | ICD-10-CM

## 2019-05-13 ENCOUNTER — Ambulatory Visit (HOSPITAL_COMMUNITY)
Admission: RE | Admit: 2019-05-13 | Discharge: 2019-05-13 | Disposition: A | Discharge status: Home / Self Care | Payer: Medicare Other | Source: Ambulatory Visit | Attending: Sports Medicine | Admitting: Sports Medicine

## 2019-05-13 ENCOUNTER — Other Ambulatory Visit: Payer: Self-pay

## 2019-05-13 DIAGNOSIS — I739 Peripheral vascular disease, unspecified: Secondary | ICD-10-CM | POA: Diagnosis not present

## 2019-05-15 ENCOUNTER — Other Ambulatory Visit: Payer: Self-pay | Admitting: Family Medicine

## 2019-05-15 DIAGNOSIS — G894 Chronic pain syndrome: Secondary | ICD-10-CM

## 2019-05-18 ENCOUNTER — Encounter: Payer: Self-pay | Admitting: Gastroenterology

## 2019-05-24 ENCOUNTER — Encounter: Payer: Self-pay | Admitting: Sports Medicine

## 2019-05-24 ENCOUNTER — Other Ambulatory Visit: Payer: Self-pay

## 2019-05-24 ENCOUNTER — Ambulatory Visit
Admission: RE | Admit: 2019-05-24 | Discharge: 2019-05-24 | Disposition: A | Discharge status: Home / Self Care | Payer: Medicare Other | Source: Ambulatory Visit | Attending: Orthopedic Surgery | Admitting: Orthopedic Surgery

## 2019-05-24 ENCOUNTER — Ambulatory Visit (INDEPENDENT_AMBULATORY_CARE_PROVIDER_SITE_OTHER): Payer: Medicare Other | Admitting: Sports Medicine

## 2019-05-24 ENCOUNTER — Telehealth: Payer: Self-pay | Admitting: *Deleted

## 2019-05-24 DIAGNOSIS — Z9889 Other specified postprocedural states: Secondary | ICD-10-CM

## 2019-05-24 DIAGNOSIS — I739 Peripheral vascular disease, unspecified: Secondary | ICD-10-CM

## 2019-05-24 DIAGNOSIS — S32050A Wedge compression fracture of fifth lumbar vertebra, initial encounter for closed fracture: Secondary | ICD-10-CM | POA: Diagnosis present

## 2019-05-24 DIAGNOSIS — M47816 Spondylosis without myelopathy or radiculopathy, lumbar region: Secondary | ICD-10-CM

## 2019-05-24 DIAGNOSIS — E1141 Type 2 diabetes mellitus with diabetic mononeuropathy: Secondary | ICD-10-CM | POA: Diagnosis not present

## 2019-05-24 NOTE — Telephone Encounter (Signed)
-----   Message from [place], [place] sent at 05/24/2019  1:00 PM EST ----- Regarding: Refer to vein vascular Refer to vein vascular for further recommendations Positive reflux studies consistent with chronic venous changes bilateral

## 2019-05-24 NOTE — Telephone Encounter (Signed)
Faxed required form, clinicals and demographics to [facility]. 

## 2019-05-24 NOTE — Progress Notes (Signed)
Subjective: [name] is a 63 y.o. female patient who presents to office for follow-up evaluation of bilateral swelling.  Patient reports that the swelling is a little bit better less intensely red on the left and no recurrent blisters.  Patient denies any changes with medication or health since last visit.  Patient is also here for review of venous reflux test results.  No other acute pedal complaints or symptoms noted at this time.  Patient Active Problem List   Diagnosis Date Noted  . Healthcare maintenance 04/16/2018  . Mixed Alzheimer's and vascular dementia ([place]) 08/28/2017  . Abnormal TSH 08/28/2017  . Memory loss 08/10/2017  . Urge incontinence 07/25/2016  . Impaired functional mobility, balance, and endurance 07/24/2016  . Falls 05/23/2016  . 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 Dyslipidemia associated with type 2 diabetes mellitus ([place]) 04/26/2015  . Benign paroxysmal positional vertigo 01/24/2015  . Diabetes mellitus type 2, uncomplicated ([place]) 99991111  . Vitamin D deficiency 07/11/2013  . Mood disorder ([place]) 07/11/2013  . Osteopenia 11/04/2012  . Atony of bladder 06/10/2010  . Uterovaginal prolapse, incomplete 06/10/2010  . VAGINITIS, ATROPHIC 05/17/2010  . 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 Chronic pain syndrome 04/26/2010  . Morbid obesity ([place]) 09/17/2006  . GASTROESOPHAGEAL REFLUX, NO ESOPHAGITIS 09/17/2006    Current Outpatient Medications on File Prior to Visit  Medication Sig Dispense Refill  . ACCU-CHEK AVIVA PLUS test strip USE  TO CHECK BLOOD SUGAR THREE TIMES DAILY 300 each 11  . ACCU-CHEK SOFTCLIX LANCETS lancets USE  TO CHECK BLOOD SUGAR THREE TIMES DAILY 300 each 11  . albuterol (PROVENTIL HFA;VENTOLIN HFA) 108 (90 Base) MCG/ACT inhaler Inhale 1-2 puffs into the lungs every 6 (six) hours as needed for wheezing. 2 Inhaler 2  . albuterol (PROVENTIL) (2.5 MG/3ML) 0.083% nebulizer  solution Take 3 mLs (2.5 mg total) by nebulization every 4 (four) hours as needed for wheezing. 75 mL 2  . Alcohol Swabs (B-D SINGLE USE SWABS REGULAR) PADS USE  TO  CLEAN  SKIN  BEFORE CHECKING BLOOD SUGAR 300 each 11  . alendronate (FOSAMAX) 70 MG tablet TAKE 1 TABLET BY MOUTH ONE TIME PER WEEK (Patient taking differently: Take 70 mg by mouth every Sunday. ) 12 tablet 0  . amoxicillin-clavulanate (AUGMENTIN) 875-125 MG tablet Take 1 tablet by mouth 2 (two) times daily. 28 tablet 0  . aspirin EC 81 MG tablet Take 81 mg by mouth daily.    Marland Kitchen atorvastatin (LIPITOR) 10 MG tablet Take 1 tablet (10 mg total) by mouth daily. 90 tablet 3  . baclofen (LIORESAL) 20 MG tablet Take 20 mg by mouth 3 (three) times daily.    . Calcium Carbonate-Vitamin D (CALCIUM-D PO) Take 1 tablet by mouth daily.    Marland Kitchen docusate sodium (COLACE) 250 MG capsule Take 250 mg by mouth daily.    . DULoxetine (CYMBALTA) 60 MG capsule Take 1 capsule (60 mg total) by mouth daily. 90 capsule 3  . gabapentin (NEURONTIN) 800 MG tablet TAKE 1 TABLET BY MOUTH THREE TIMES A DAY 270 tablet 0  . JANUVIA 100 MG tablet TAKE 1 TABLET BY MOUTH EVERY DAY IN THE EVENING (Patient taking differently: Take 100 mg by mouth every evening. ) 90 tablet 3  . loratadine (CLARITIN) 10 MG tablet Take 10 mg by mouth daily.    . meclizine (ANTIVERT) 12.5 MG tablet TAKE 1 TABLET 2 TIMES DAILY AS NEEDED FOR DIZZINESS. (Patient taking differently: Take  12.5 mg by mouth 3 (three) times daily. ) 180 tablet 0  . meloxicam (MOBIC) 15 MG tablet TAKE 1 TABLET BY MOUTH EVERY DAY (Patient taking differently: Take 15 mg by mouth daily. ) 30 tablet 2  . metFORMIN (GLUCOPHAGE) 1000 MG tablet TAKE 1 TABLET (1,000 MG TOTAL) BY MOUTH 2 (TWO) TIMES DAILY WITH A MEAL. 180 tablet 0  . Multiple Vitamins-Minerals ([employer] SPECTRAVITE WOMENS SENIOR PO) Take 1 tablet by mouth daily.     Marland Kitchen omeprazole (PRILOSEC) 20 MG capsule TAKE 1 CAPSULE BY MOUTH EVERY DAY IN THE EVENING (Patient taking  differently: Take 20 mg by mouth every evening. ) 90 capsule 0  . ondansetron (ZOFRAN) 4 MG tablet Take 1 tablet (4 mg total) by mouth every 8 (eight) hours as needed for nausea or vomiting. (Patient taking differently: Take 4 mg by mouth 3 (three) times daily. ) 30 tablet 2  . oxycodone (OXY-IR) 5 MG capsule Take 5 mg by mouth every 4 (four) hours as needed.    Marland Kitchen oxyCODONE (ROXICODONE) 5 MG immediate release tablet Take 1 tablet (5 mg total) by mouth every 8 (eight) hours as needed. 20 tablet 0  . psyllium (METAMUCIL) 58.6 % powder Take 1 packet by mouth daily as needed (constipation).    . silver sulfADIAZINE (SILVADENE) 1 % cream Apply 1 application topically daily. (Patient taking differently: Apply 1 application topically 2 (two) times daily. ) 50 g 1  . triamcinolone (KENALOG) 0.025 % cream Apply 1 application topically 3 (three) times daily as needed (rash).      No current facility-administered medications on file prior to visit.     Allergies  Allergen Reactions  . Hydrocodone-Acetaminophen Nausea And Vomiting    Objective:  General: Alert and oriented x3 in no acute distress  Dermatology: No recurrent blisters bilateral.  No open lesions bilateral lower extremities, no webspace macerations, no ecchymosis bilateral, all nails x 10 are well manicured.  Faint red discoloration that improves with elevation left greater than right leg likely related to venous reflux.  Vascular: Dorsalis Pedis and Posterior Tibial pedal pulses palpable faintly, Capillary Fill Time 5 seconds,scant pedal hair growth bilateral, 1+ pitting  edema bilateral lower extremities, Temperature gradient within normal limits with slight increase on the left leg compared to the right as before.  Neurology: Johney Maine sensation intact via light touch bilateral, protective sensation intact however oratory diminished bilateral.  Musculoskeletal: No specific tenderness bilateral however diffusely there is pain to legs with  increased subjective burning pain to feet feels like she is walking on bubbles like before which could be related to history of neuropathy swelling or history of previous back surgery.  No pain to palpation to the calves.  Assessment and Plan: Problem List Items Addressed This Visit    None    Visit Diagnoses    PVD (peripheral vascular disease) ([place])    -  Primary   Neuritis due to diabetes mellitus ([place])           -Complete examination performed -Discussed treatment options for PVD and neuritis with history of diabetes and back surgery -Venous reflux study reviewed with patient changes are consistent with chronic venous changes will refer patient to vein vascular for further recommendations -Recommend at this time for patient to follow-up with PCP for possible additional add on of Lasix or diuretic to help with edema -Advised patient that her nerve symptoms are likely coming from her back or her history of diabetes to follow-up with prescribing doctor  of gabapentin for medication adjustments if needed like before -Patient to return to office as scheduled for routine diabetic foot care or sooner if problems or condition worsens.  Landis Martins, DPM

## 2019-05-27 IMAGING — CT CT KNEE*L* W/O CM
4 of 8 series · 12 of 33 positions shown, 13 images · non-contrast
Comparison: Plain films left knee 03/25/2018.

CLINICAL DATA: Preoperative planning study using the MY KNEE
protocol. Chronic left knee pain. Patient for knee replacement

EXAM:
CT OF THE LEFT KNEE WITHOUT CONTRAST
TECHNIQUE: Multidetector CT imaging of the left knee was performed according to
the standard protocol. Multiplanar CT image reconstructions were
also generated. Axial imaging only of the left hip and ankle was
also performed.

[Series 3: axial bone knee · axial · 0.34mm/px · z∈[+994,+1156]mm · 3 of 163 slices shown, 4 images]
[im 41/163  soft-tissue]
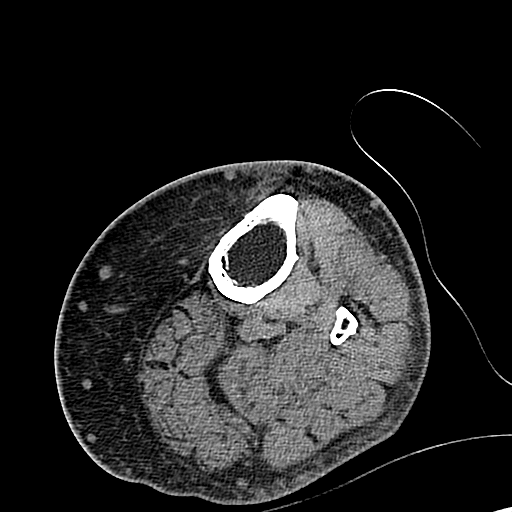
[im 41/163  bone]
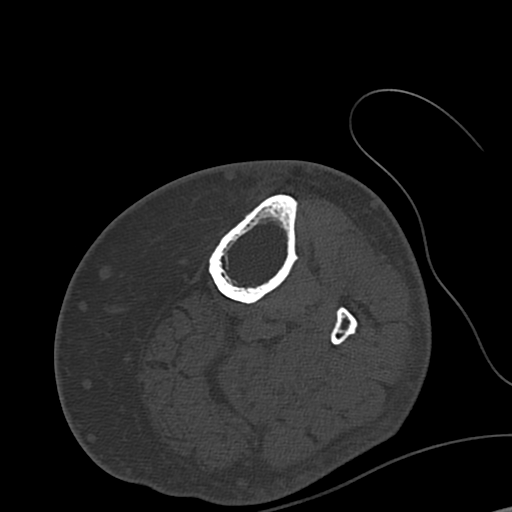
[im 82/163  bone]
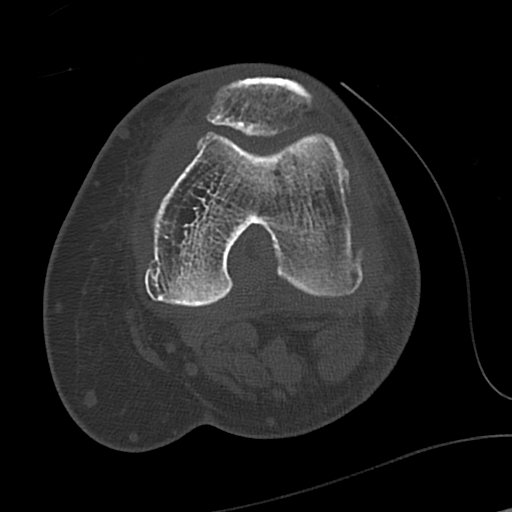
[im 122/163  bone]
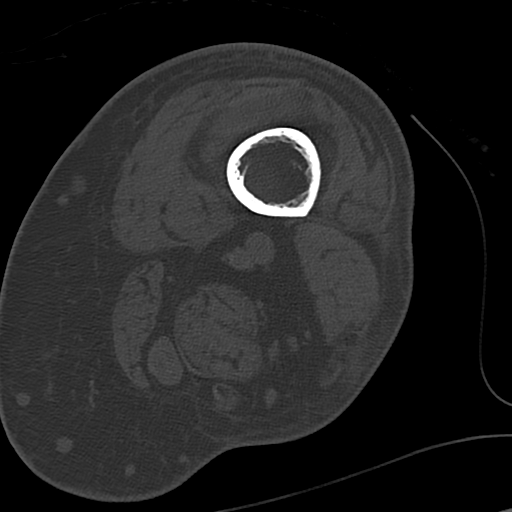

[Series 4: axial st knee · axial · 0.34mm/px · z∈[+994,+1156]mm · 3 of 163 slices shown]
[im 41/163  bone]
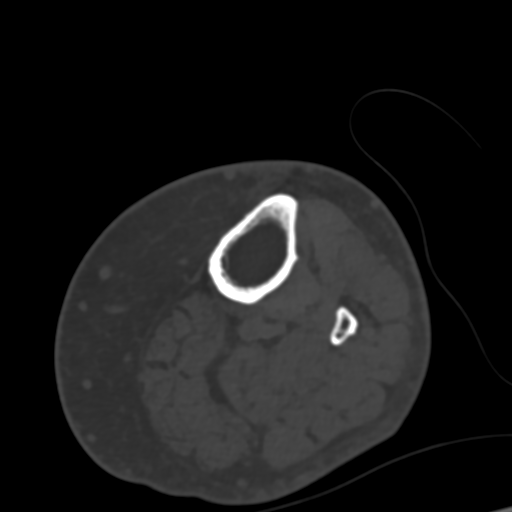
[im 82/163  bone]
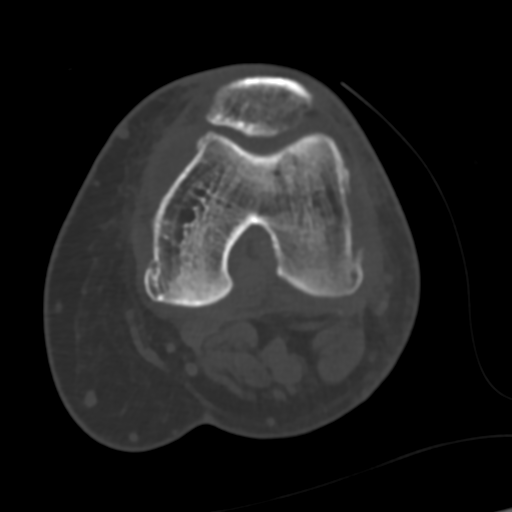
[im 122/163  bone]
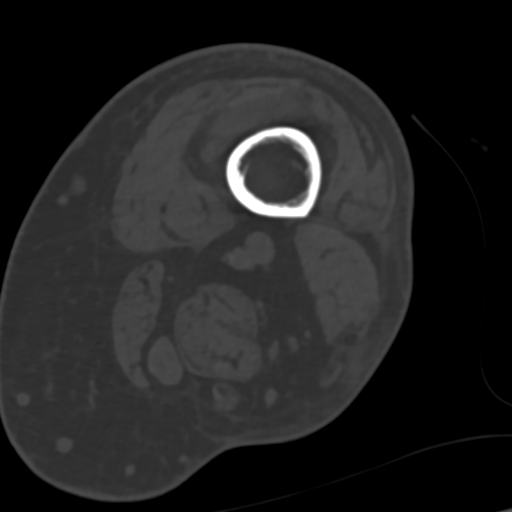

[Series 5: coronal bone knee · coronal · 0.25mm/px · 1 of 80 slices shown]
[im 40/80  bone]
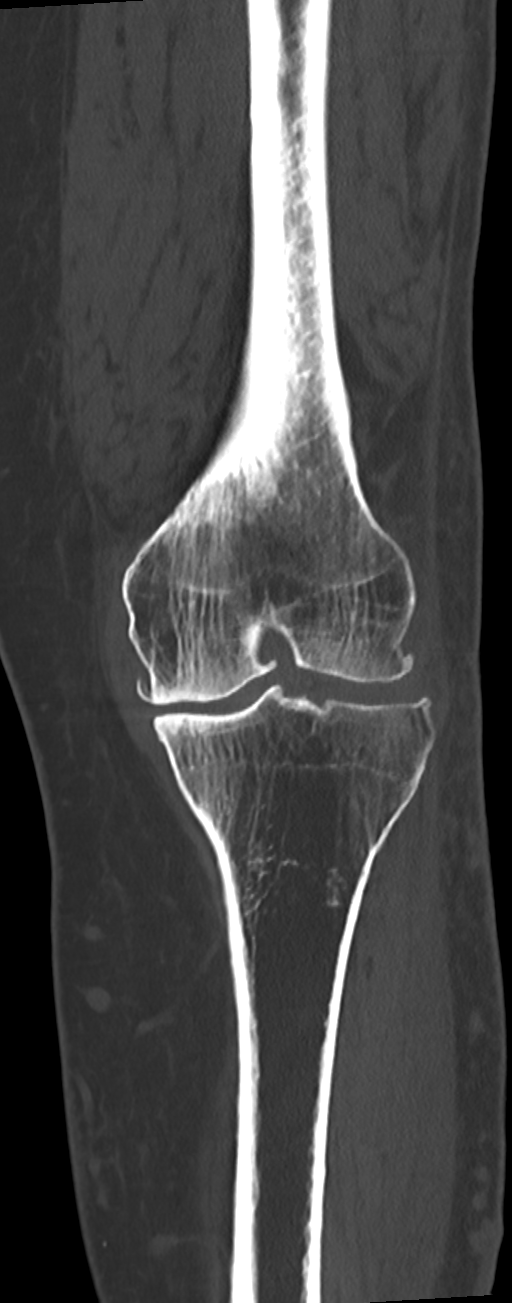

[Series 9: sagittal bone knee · sagittal · 0.32mm/px · 5 of 64 slices shown]
[im 11/64  bone]
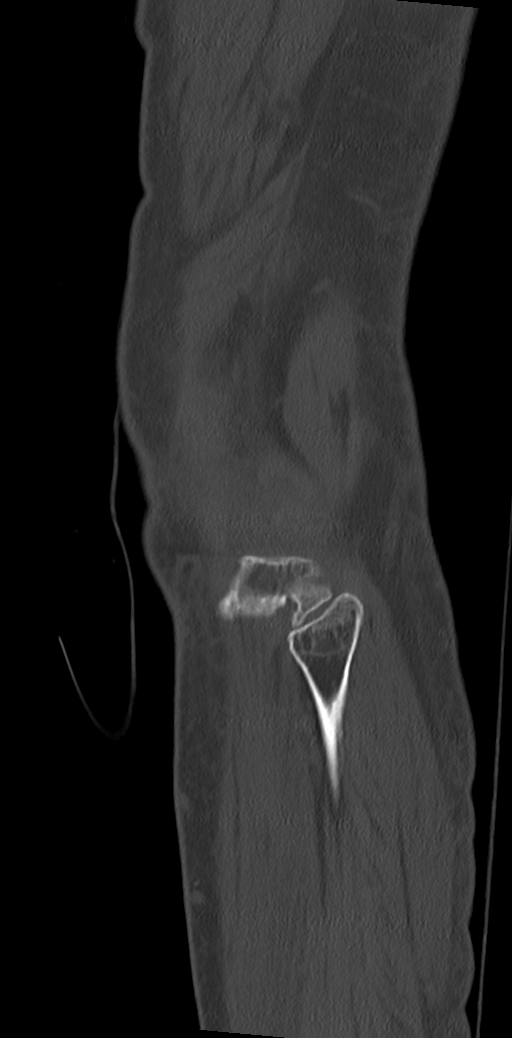
[im 22/64  bone]
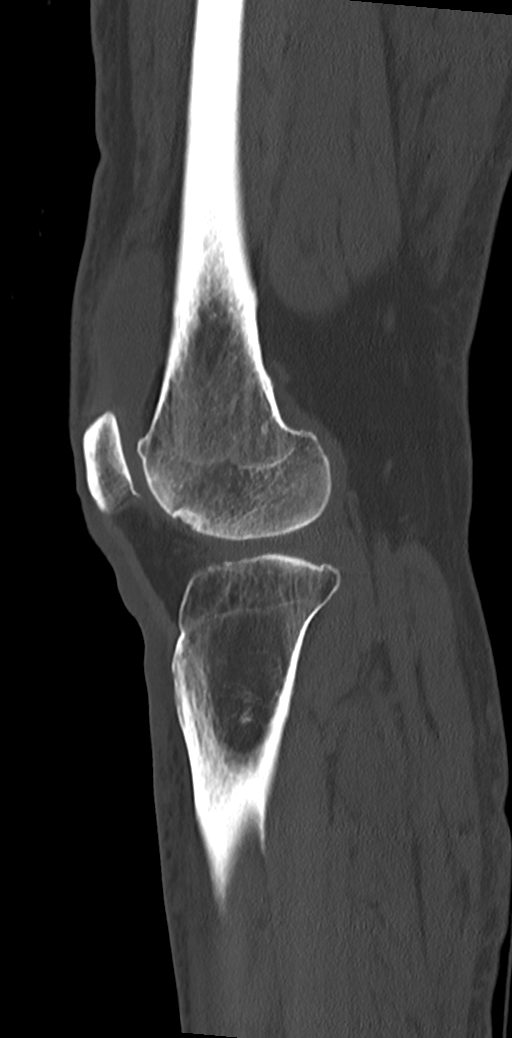
[im 32/64  bone]
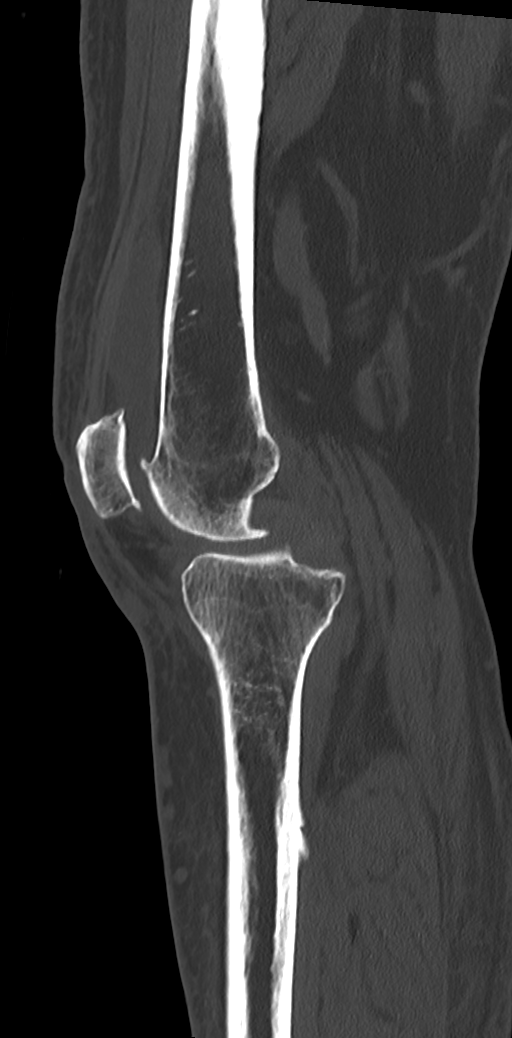
[im 43/64  bone]
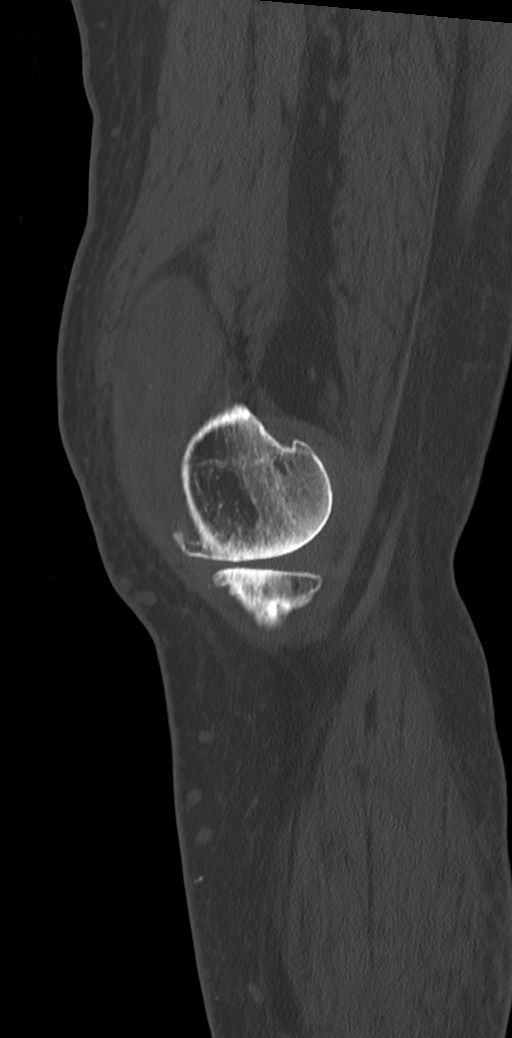
[im 53/64  bone]
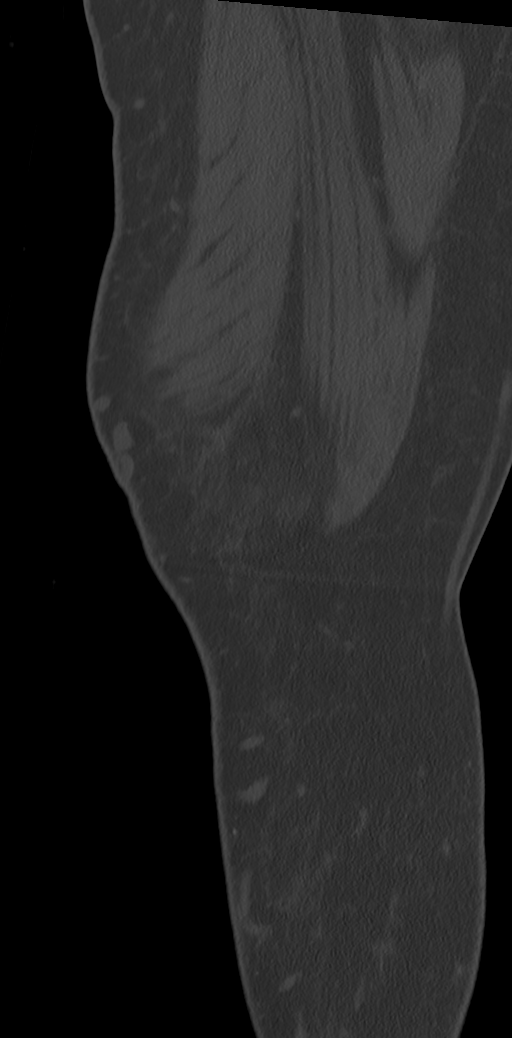

[12 of 33 positions shown; findings below may reference images not displayed]

FINDINGS: Bones/Joint/Cartilage

Axial imaging of the left hip demonstrates no acute abnormality.
There is disease the symphysis pubis and left SI joint. No acute
abnormality left ankle is identified. Degenerative change about the
tarsometatarsal joints appears worst at the second and third.
Calcifications in the plantar fascia are noted.

The patient has osteoarthritis about all 3 compartments of the knee.
Degenerative change is worst medially where there is bone-on-bone
joint space narrowing. Several small loose bodies are identified in
the joint. The patient has a moderately large joint effusion
containing debris.

Ligaments

Suboptimally assessed by CT.

Muscles and Tendons

Intact and normal appearance.

Soft tissues

No acute or focal abnormality.
IMPRESSION: Advanced left knee osteoarthritis is worst in the medial
compartment. Joint effusion containing debris and loose bodies are
noted.

## 2019-05-28 ENCOUNTER — Other Ambulatory Visit: Payer: Self-pay | Admitting: Family Medicine

## 2019-05-28 DIAGNOSIS — M25561 Pain in right knee: Secondary | ICD-10-CM

## 2019-05-28 DIAGNOSIS — M8580 Other specified disorders of bone density and structure, unspecified site: Secondary | ICD-10-CM

## 2019-06-03 ENCOUNTER — Other Ambulatory Visit: Payer: Self-pay | Admitting: Family Medicine

## 2019-06-03 DIAGNOSIS — G8929 Other chronic pain: Secondary | ICD-10-CM

## 2019-06-03 DIAGNOSIS — K219 Gastro-esophageal reflux disease without esophagitis: Secondary | ICD-10-CM

## 2019-06-06 ENCOUNTER — Encounter: Payer: Medicare Other | Admitting: Gastroenterology

## 2019-06-07 ENCOUNTER — Telehealth (INDEPENDENT_AMBULATORY_CARE_PROVIDER_SITE_OTHER): Payer: Medicare Other | Admitting: Family Medicine

## 2019-06-07 ENCOUNTER — Other Ambulatory Visit: Payer: Self-pay

## 2019-06-07 DIAGNOSIS — K529 Noninfective gastroenteritis and colitis, unspecified: Secondary | ICD-10-CM | POA: Diagnosis not present

## 2019-06-07 NOTE — Progress Notes (Signed)
[place] Telemedicine Visit  Patient consented to have virtual visit. Method of visit: Video was attempted, but technology challenges prevented patient from using video, so visit was conducted via telephone.  Encounter participants: Patient: [name] - located at home Provider: Wilber Oliphant - located at [facility] Others (if applicable): n/a  Chief Complaint: Diarrhea  HPI: Diarrhea x 3 weeks. Back surgery about a month ago on 05/09/19. Patient reports BM every time she drinks or eats something or at least 5 times a day. BM are loose and "dark looking" from dark green to black over the last 3-4 days. Last time in was black was this morning. When the diarrhea first started, it was brown. Varies from small to large volume. Diarrhea Patient also complains of a hemorrhoid that has pain and is having bright red blood from there. She did wipe up a "liver looking thing" that looked like a clot. Has only passed one clot. No frank blood loss.  She is currently taking colace and metformin is not a new medication.   Denies fevers, n/v. Reports that her heart feels like it "beats out of her chest" when she is up and walking around. She reports having trouble walking since the surgery (which she is being followed by her surgeons for).  Patient has 12/18 colonoscopy at 8am. She denies lightheadedness, dizziness, but does report that she.   ROS: per HPI  Pertinent PMHx: recent back surgery, DMII  Exam:  Respiratory: speaking in full sentences. No respiratory distress.   Labs:  Most recent Hgb 11.9 which is her baseline.   Assessment/Plan:  Chronic diarrhea of unknown origin Patient complaining of diarrhea x3 weeks.  Patient denies any recent antibiotic use but does report that she was recently on steroids.  Patient reports that she does not have any abdominal pain with the diarrhea. Chronic diarrhea after hospital stay concerning for C. Difficile. DDx includes infection,  food allergy, medicine given acute onset.   She is currently taking Colace but no other bowel regimen.  The metformin is not new on her medication list.  She otherwise denies fevers, nausea vomiting.  Her next colonoscopy is scheduled for December 18 at 8 AM.   Melena-her presentation is concerning for dark looking stool that is black only over the last 3 to 4 days.  Additionally, she reports that she has been tachycardic and it feels like her heart is beating out of her chest.  She did recently have surgery and could be deconditioned but also worrisome for an anemia.  Patient denies any frank blood loss. I think is important that this patient come in to be seen in person to get vitals, labs, physical exam.  Schedule appointment with Dr. Shan Levans tomorrow at 330.   - Patient given return precautions and aware of after-hours line should she need any help - Patient encouraged to stay hydrated and drink plenty of fluids as she may get dehydrated due to diarrhea    Time spent during visit with patient: 17 minutes  Wilber Oliphant, M.D.  9:27 AM 06/07/2019

## 2019-06-07 NOTE — Assessment & Plan Note (Signed)
Patient complaining of diarrhea x3 weeks.  Patient denies any recent antibiotic use but does report that she was recently on steroids.  Patient reports that she does not have any abdominal pain with the diarrhea. Chronic diarrhea after hospital stay concerning for C. Difficile. DDx includes infection, food allergy, medicine given acute onset.   She is currently taking Colace but no other bowel regimen.  The metformin is not new on her medication list.  She otherwise denies fevers, nausea vomiting.  Her next colonoscopy is scheduled for December 18 at 8 AM.   Melena-her presentation is concerning for dark looking stool that is black only over the last 3 to 4 days.  Additionally, she reports that she has been tachycardic and it feels like her heart is beating out of her chest.  She did recently have surgery and could be deconditioned but also worrisome for an anemia.  Patient denies any frank blood loss. I think is important that this patient come in to be seen in person to get vitals, labs, physical exam.  Schedule appointment with Dr. Shan Levans tomorrow at 330.   - Patient given return precautions and aware of after-hours line should she need any help - Patient encouraged to stay hydrated and drink plenty of fluids as she may get dehydrated due to diarrhea

## 2019-06-08 ENCOUNTER — Ambulatory Visit (INDEPENDENT_AMBULATORY_CARE_PROVIDER_SITE_OTHER): Payer: Medicare Other | Admitting: Family Medicine

## 2019-06-08 ENCOUNTER — Other Ambulatory Visit: Payer: Self-pay

## 2019-06-08 ENCOUNTER — Telehealth: Payer: Self-pay | Admitting: *Deleted

## 2019-06-08 VITALS — BP 130/70 | HR 102

## 2019-06-08 DIAGNOSIS — K921 Melena: Secondary | ICD-10-CM | POA: Diagnosis not present

## 2019-06-08 MED ORDER — LOPERAMIDE HCL 2 MG PO TABS: 4.0000 mg | ORAL_TABLET | Freq: Three times a day (TID) | ORAL | 0 refills | Status: DC | PRN

## 2019-06-08 NOTE — Progress Notes (Addendum)
     Subjective: Chief Complaint  Patient presents with  . Leg Swelling  . Back Pain   HPI: BRITNIE SITTERLY is a 63 y.o. presenting to clinic today to discuss the following:  Concern for Melena Patient is a 63y/o female with PMH of diarrhea, chronic back pain, hemorrhoids, and T2DM presenting for change in stool color. She states she has had diarrhea for 2-3 weeks described as loose formed stools 3-4 times per day. She states they were brown until 3-4 days ago and turned dark green and now black. She denies fever, abdominal pain, nausea, vomiting. She is taking Pepto and imodium for diarrhea with no improvement. She still taking metamucil. No syncope, chest pain, or shortness of breath. She does have an associated headache.     ROS noted in HPI.    Social History   Tobacco Use  Smoking Status Never Smoker  Smokeless Tobacco Never Used    Objective: BP 130/70   Pulse (!) 102  Vitals and nursing notes reviewed  Physical Exam Gen: Alert and Oriented x 3, NAD Abd: non-distended, non-tender, soft, +bs in all four quadrants Ext: no clubbing, cyanosis, bilateral LE edema Neuro: No gross deficits Skin: warm, dry, intact, no rashes  Assessment/Plan:  Melena Given her history and description I am less concerned that this is actual melena. She is having some dark stools but today says its sometimes dark green. No red flag symptoms. - CBC w/Diff to rule out acute anemia - Stop Pepto as this will make stools dark - Imodium increase to 4mg  up to 4 times per day to help with diarrhea. - Sitz baths, tucks pads, and OTC cream for hemorrhoid - Patient states she has a scheduled colonoscopy for 06/28/2019 with her GI doctor.  PATIENT EDUCATION PROVIDED: See AVS    Diagnosis and plan along with any newly prescribed medication(s) were discussed in detail with this patient today. The patient verbalized understanding and agreed with the plan. Patient advised if symptoms worsen return to  clinic or ER.    Orders Placed This Encounter  Procedures  . CBC with Differential    Meds ordered this encounter  Medications  . loperamide (IMODIUM A-D) 2 MG tablet    Sig: Take 2 tablets (4 mg total) by mouth 3 (three) times daily as needed for diarrhea or loose stools.    Dispense:  30 tablet    Refill:  0     Harolyn Rutherford, DO 06/08/2019, 3:52 PM PGY-3 [place]

## 2019-06-08 NOTE — Telephone Encounter (Signed)
Pt's dtr, [name] states pt was referred to PCP for fluid bills and referred to vascular doctor, but have not received a call from the vascular doctor.

## 2019-06-08 NOTE — Telephone Encounter (Signed)
Left message informing pt's dtr, [name], [facility] referral and they could call to schedule [phone], or [phone].

## 2019-06-08 NOTE — Assessment & Plan Note (Signed)
Given her history and description I am less concerned that this is actual melena. She is having some dark stools but today says its sometimes dark green. No red flag symptoms. - CBC w/Diff to rule out acute anemia - Stop Pepto as this will make stools dark - Imodium increase to 4mg  up to 4 times per day to help with diarrhea. - Sitz baths, tucks pads, and OTC cream for hemorrhoid

## 2019-06-08 NOTE — Patient Instructions (Signed)
Diarrhea, Adult Diarrhea is when you pass loose and watery poop (stool) often. Diarrhea can make you feel weak and cause you to lose water in your body (get dehydrated). Losing water in your body can cause you to:  Feel tired and thirsty.  Have a dry mouth.  Go pee (urinate) less often.  Follow these instructions at home: Eating and drinking     Follow these instructions as told by your doctor:  Take an ORS (oral rehydration solution). This is a drink that helps you replace fluids and minerals your body lost. It is sold at pharmacies and stores.  Drink plenty of fluids, such as: ? Water. ? Ice chips. ? Diluted fruit juice. ? Low-calorie sports drinks. ? Milk, if you want.  Avoid drinking fluids that have a lot of sugar or caffeine in them.  Eat bland, easy-to-digest foods in small amounts as you are able. These foods include: ? Bananas. ? Applesauce. ? Rice. ? Low-fat (lean) meats. ? Toast. ? Crackers.  Avoid alcohol.  Avoid spicy or fatty foods.  Medicines  Take over-the-counter and prescription medicines only as told by your doctor.  If you were prescribed an antibiotic medicine, take it as told by your doctor. Do not stop using the antibiotic even if you start to feel better. General instructions   Wash your hands often using soap and water. If soap and water are not available, use a hand sanitizer. Others in your home should wash their hands as well. Hands should be washed: ? After using the toilet or changing a diaper. ? Before preparing, cooking, or serving food. ? While caring for a sick person. ? While visiting someone in a hospital.  Drink enough fluid to keep your pee (urine) pale yellow.  Rest at home while you get better.  Watch your condition for any changes.  Take a warm bath to help with any burning or pain from having diarrhea.  Keep all follow-up visits as told by your doctor. This is important. Contact a doctor if:  You have a fever.   Your diarrhea gets worse.  You have new symptoms.  You cannot keep fluids down.  You feel light-headed or dizzy.  You have a headache.  You have muscle cramps. Get help right away if:  You have chest pain.  You feel very weak or you pass out (faint).  You have bloody or black poop or poop that looks like tar.  You have very bad pain, cramping, or bloating in your belly (abdomen).  You have trouble breathing or you are breathing very quickly.  Your heart is beating very quickly.  Your skin feels cold and clammy.  You feel confused.  You have signs of losing too much water in your body, such as: ? Dark pee, very little pee, or no pee. ? Cracked lips. ? Dry mouth. ? Sunken eyes. ? Sleepiness. ? Weakness. Summary  Diarrhea is when you pass loose and watery poop (stool) often.  Diarrhea can make you feel weak and cause you to lose water in your body (get dehydrated).  Take an ORS (oral rehydration solution). This is a drink that is sold at pharmacies and stores.  Eat bland, easy-to-digest foods in small amounts as you are able.  Contact a doctor if your condition gets worse. Get help right away if you have signs that you have lost too much water in your body. This information is not intended to replace advice given to you by your health care  provider. Make sure you discuss any questions you have with your health care provider. Document Released: 12/24/2007 Document Revised: 12/11/2017 Document Reviewed: 12/11/2017 Elsevier Patient Education  2020 [employer].

## 2019-06-09 ENCOUNTER — Ambulatory Visit: Payer: Medicare Other

## 2019-06-09 LAB — CBC WITH DIFFERENTIAL/PLATELET
Basophils Absolute: 0.1 10*3/uL (ref 0.0–0.2)
Basos: 1 %
EOS (ABSOLUTE): 0.3 10*3/uL (ref 0.0–0.4)
Eos: 3 %
Hematocrit: 34.9 % (ref 34.0–46.6)
Hemoglobin: 11.6 g/dL (ref 11.1–15.9)
Immature Grans (Abs): 0 10*3/uL (ref 0.0–0.1)
Immature Granulocytes: 0 %
Lymphocytes Absolute: 2.5 10*3/uL (ref 0.7–3.1)
Lymphs: 29 %
MCH: 30 pg (ref 26.6–33.0)
MCHC: 33.2 g/dL (ref 31.5–35.7)
MCV: 90 fL (ref 79–97)
Monocytes Absolute: 1.2 10*3/uL — ABNORMAL HIGH (ref 0.1–0.9)
Monocytes: 13 %
Neutrophils Absolute: 4.6 10*3/uL (ref 1.4–7.0)
Neutrophils: 54 %
Platelets: 173 10*3/uL (ref 150–450)
RBC: 3.87 x10E6/uL (ref 3.77–5.28)
RDW: 15.9 % — ABNORMAL HIGH (ref 11.7–15.4)
WBC: 8.6 10*3/uL (ref 3.4–10.8)

## 2019-06-20 ENCOUNTER — Ambulatory Visit (INDEPENDENT_AMBULATORY_CARE_PROVIDER_SITE_OTHER): Payer: Medicare Other | Admitting: Physician Assistant

## 2019-06-20 ENCOUNTER — Other Ambulatory Visit: Payer: Self-pay

## 2019-06-20 VITALS — BP 122/66 | HR 93 | Temp 97.7°F | Resp 14 | Ht 65.0 in | Wt 231.0 lb

## 2019-06-20 DIAGNOSIS — I890 Lymphedema, not elsewhere classified: Secondary | ICD-10-CM

## 2019-06-20 DIAGNOSIS — I872 Venous insufficiency (chronic) (peripheral): Secondary | ICD-10-CM | POA: Diagnosis not present

## 2019-06-20 NOTE — Progress Notes (Signed)
[facility] [place]   Reason for referral: Swollen B leg  History of Present Illness  [name] is a 63 y.o. female who presents with chief complaint: swollen leg.  Patient notes, onset of swelling . 3 months ago, associated with spine surgery Kyphoplasty.  The patient has had no history of DVT, no history of varicose vein, no history of venous stasis ulcers, no history of  Lymphedema and + B feet erythema in dependent position.  There is no family history of venous disorders.  The patient has not used compression stockings in the past.   Past Medical History:  Diagnosis Date  . Allergy   . Arthritis   . COPD (chronic obstructive pulmonary disease) ([place])   . Diabetes mellitus, type 2 ([place])   . Excessive daytime sleepiness 11/12/2017  . GERD (gastroesophageal reflux disease)   . Goiter   . History of kidney stones   . Hypertension   . Kidney stone   . Memory loss   . 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 Osteoporosis    in lower back per pt   . Plantar fasciitis   . Snoring 08/28/2017  . Stroke [facility])    residual left sided weakness  . Tremors of nervous system   . Vertigo     Past Surgical History:  Procedure Laterality Date  . APPENDECTOMY    . BLADDER SURGERY    . CATARACT EXTRACTION W/PHACO Left 05/06/2017   Procedure: CATARACT EXTRACTION PHACO AND INTRAOCULAR LENS PLACEMENT ([place]) LEFT DIABETIC;  Surgeon: Leandrew Koyanagi, MD;  Location: [facility];  Service: Ophthalmology;  Laterality: Left;  Diabetic - oral meds  . CATARACT EXTRACTION W/PHACO Right 06/03/2017   Procedure: CATARACT EXTRACTION PHACO AND INTRAOCULAR LENS PLACEMENT ([place]) RIGHT DIABETIC;  Surgeon: Leandrew Koyanagi, MD;  Location: [facility];  Service: Ophthalmology;  Laterality: Right;  Diabetic - oral meds  . CHOLECYSTECTOMY    . FOOT SURGERY    . KYPHOPLASTY N/A 04/07/2019   Procedure: L4 KYPHOPLASTY;  Surgeon: Hessie Knows, MD;   Location: [facility];  Service: Orthopedics;  Laterality: N/A;  . TOTAL ABDOMINAL HYSTERECTOMY    . TOTAL KNEE ARTHROPLASTY Left 09/21/2018   Procedure: TOTAL KNEE ARTHROPLASTY-LEFT NEEDS RNFA;  Surgeon: Hessie Knows, MD;  Location: [facility];  Service: Orthopedics;  Laterality: Left;    Social History   Socioeconomic History  . Marital status: Single    Spouse name: Not on file  . Number of children: 4  . Years of education: HS  . Highest education level: Not on file  Occupational History  . Occupation: Unemployed  Social Needs  . Financial resource strain: Not hard at all  . Food insecurity    Worry: Never true    Inability: Never true  . Transportation needs    Medical: No    Non-medical: No  Tobacco Use  . Smoking status: Never Smoker  . Smokeless tobacco: Never Used  Substance and Sexual Activity  . Alcohol use: No  . Drug use: No  . Sexual activity: Not on file  Lifestyle  . Physical activity    Days per week: 2 days    Minutes per session: 30 min  . Stress: Only a little  Relationships  . Social Herbalist on phone: Twice a week    Gets together: Once a week    Attends religious service: Never    Active member of club or organization: No  Attends meetings of clubs or organizations: Never    Relationship status: Not on file  . Intimate partner violence    Fear of current or ex partner: Patient refused    Emotionally abused: Patient refused    Physically abused: Patient refused    Forced sexual activity: Patient refused  Other Topics Concern  . Not on file  Social History Narrative   Lives at home alone.   Right-handed.   Rarely uses caffeine.    Family History  Problem Relation Age of Onset  . Healthy Mother   . Heart disease Father   . Alcohol abuse Father   . Alcohol abuse Brother   . Heart disease Sister   . Diabetes Sister   . Breast cancer Daughter   . Colon polyps Neg Hx   . Colon cancer Neg Hx   . Esophageal cancer Neg Hx   .  Rectal cancer Neg Hx   . Stomach cancer Neg Hx     Current Outpatient Medications on File Prior to Visit  Medication Sig Dispense Refill  . ACCU-CHEK AVIVA PLUS test strip USE  TO CHECK BLOOD SUGAR THREE TIMES DAILY 300 each 11  . ACCU-CHEK SOFTCLIX LANCETS lancets USE  TO CHECK BLOOD SUGAR THREE TIMES DAILY 300 each 11  . albuterol (PROVENTIL HFA;VENTOLIN HFA) 108 (90 Base) MCG/ACT inhaler Inhale 1-2 puffs into the lungs every 6 (six) hours as needed for wheezing. 2 Inhaler 2  . albuterol (PROVENTIL) (2.5 MG/3ML) 0.083% nebulizer solution Take 3 mLs (2.5 mg total) by nebulization every 4 (four) hours as needed for wheezing. 75 mL 2  . Alcohol Swabs (B-D SINGLE USE SWABS REGULAR) PADS USE  TO  CLEAN  SKIN  BEFORE CHECKING BLOOD SUGAR 300 each 11  . alendronate (FOSAMAX) 70 MG tablet TAKE 1 TABLET BY MOUTH ONE TIME PER WEEK 12 tablet 0  . aspirin EC 81 MG tablet Take 81 mg by mouth daily.    Marland Kitchen atorvastatin (LIPITOR) 10 MG tablet Take 1 tablet (10 mg total) by mouth daily. 90 tablet 3  . baclofen (LIORESAL) 20 MG tablet TAKE 1 TABLET (20 MG TOTAL) BY MOUTH 3 (THREE) TIMES DAILY AS NEEDED FOR MUSCLE SPASMS. 270 tablet 0  . Calcium Carbonate-Vitamin D (CALCIUM-D PO) Take 1 tablet by mouth daily.    Marland Kitchen docusate sodium (COLACE) 250 MG capsule Take 250 mg by mouth daily.    . DULoxetine (CYMBALTA) 60 MG capsule Take 1 capsule (60 mg total) by mouth daily. 90 capsule 3  . gabapentin (NEURONTIN) 800 MG tablet TAKE 1 TABLET BY MOUTH THREE TIMES A DAY 270 tablet 0  . JANUVIA 100 MG tablet TAKE 1 TABLET BY MOUTH EVERY DAY IN THE EVENING (Patient taking differently: Take 100 mg by mouth every evening. ) 90 tablet 3  . loperamide (IMODIUM A-D) 2 MG tablet Take 2 tablets (4 mg total) by mouth 3 (three) times daily as needed for diarrhea or loose stools. 30 tablet 0  . loratadine (CLARITIN) 10 MG tablet Take 10 mg by mouth daily.    . meclizine (ANTIVERT) 12.5 MG tablet TAKE 1 TABLET 2 TIMES DAILY AS NEEDED FOR  DIZZINESS. (Patient taking differently: Take 12.5 mg by mouth 3 (three) times daily. ) 180 tablet 0  . meloxicam (MOBIC) 15 MG tablet TAKE 1 TABLET BY MOUTH EVERY DAY 30 tablet 2  . metFORMIN (GLUCOPHAGE) 1000 MG tablet TAKE 1 TABLET (1,000 MG TOTAL) BY MOUTH 2 (TWO) TIMES DAILY WITH A MEAL. 180 tablet  0  . Multiple Vitamins-Minerals ([employer] SPECTRAVITE WOMENS SENIOR PO) Take 1 tablet by mouth daily.     Marland Kitchen omeprazole (PRILOSEC) 20 MG capsule TAKE 1 CAPSULE BY MOUTH EVERY DAY IN THE EVENING 90 capsule 2  . ondansetron (ZOFRAN) 4 MG tablet Take 1 tablet (4 mg total) by mouth every 8 (eight) hours as needed for nausea or vomiting. (Patient taking differently: Take 4 mg by mouth 3 (three) times daily. ) 30 tablet 2  . oxyCODONE (ROXICODONE) 5 MG immediate release tablet Take 1 tablet (5 mg total) by mouth every 8 (eight) hours as needed. 20 tablet 0  . silver sulfADIAZINE (SILVADENE) 1 % cream Apply 1 application topically daily. (Patient taking differently: Apply 1 application topically 2 (two) times daily. ) 50 g 1  . triamcinolone (KENALOG) 0.025 % cream Apply 1 application topically 3 (three) times daily as needed (rash).     . [DISCONTINUED] meloxicam (MOBIC) 15 MG tablet TAKE 1 TABLET BY MOUTH EVERY DAY (Patient taking differently: Take 15 mg by mouth daily. ) 30 tablet 2  . [DISCONTINUED] omeprazole (PRILOSEC) 20 MG capsule TAKE 1 CAPSULE BY MOUTH EVERY DAY IN THE EVENING (Patient taking differently: Take 20 mg by mouth every evening. ) 90 capsule 0   No current facility-administered medications on file prior to visit.     Allergies as of 06/20/2019 - Review Complete 06/20/2019  Allergen Reaction Noted  . Hydrocodone-acetaminophen Nausea And Vomiting 04/29/2017     ROS:   General:  No weight loss, Fever, chills  HEENT: No recent headaches, no nasal bleeding, no visual changes, no sore throat  Neurologic: No dizziness, blackouts, seizures. No recent symptoms of stroke or mini- stroke. No  recent episodes of slurred speech, or temporary blindness.  Cardiac: No recent episodes of chest pain/pressure, no shortness of breath at rest.  No shortness of breath with exertion.  Denies history of atrial fibrillation or irregular heartbeat  Vascular: No history of rest pain in feet.  No history of claudication.  No history of non-healing ulcer, No history of DVT   Pulmonary: No home oxygen, no productive cough, no hemoptysis,  No asthma or wheezing  Musculoskeletal:  [ ]  Arthritis, [x ] Low back pain,  [ ]  Joint pain  Hematologic:No history of hypercoagulable state.  No history of easy bleeding.  No history of anemia  Gastrointestinal: No hematochezia or melena,  No gastroesophageal reflux, no trouble swallowing  Urinary: [ ]  chronic Kidney disease, [ ]  on HD - [ ]  MWF or [ ]  TTHS, [ ]  Burning with urination, [ ]  Frequent urination, [ ]  Difficulty urinating;   Skin: No rashes  Psychological: No history of anxiety,  No history of depression  Physical Examination  Vitals:   06/20/19 1551  BP: 122/66  Pulse: 93  Resp: 14  Temp: 97.7 F (36.5 C)  TempSrc: Temporal  SpO2: 96%  Weight: 231 lb (104.8 kg)  Height: 5\' 5"  (1.651 m)    Body mass index is 38.44 kg/m.  General:  Alert and oriented, no acute distress HEENT: Normal Neck: No bruit or JVD Pulmonary: Clear to auscultation bilaterally Cardiac: Regular Rate and Rhythm with murmur Abdomen: Soft, non-tender, non-distended, no mass, no scars Skin: No rash Extremity Pulses:  2+ radial, brachial, femoral, dorsalis pedis pulses bilaterally Musculoskeletal: B LE ptting edema with dependent rubor and edema on the dorsum of the feet.    Neurologic: motor grossly intact weak B LE  DATA:  Venous Reflux Times Normal value < 0.5 sec +-----------+----------+---------+            Right (ms)Left (ms) +-----------+----------+---------+ CFV        2641.00   1034.00    +-----------+----------+---------+ GSV at knee          1115.00   +-----------+----------+---------+ SSV mid              660.00    +-----------+----------+---------+  +------------------------------+----------+---------+ VEIN DIAMETERS:               Right (cm)Left (cm) +------------------------------+----------+---------+ GSV at Saphenofemoral junction0.643     0.87      +------------------------------+----------+---------+ GSV at prox thigh             0.630     0.698     +------------------------------+----------+---------+ GSV at mid thigh              0.523     0.600     +------------------------------+----------+---------+ GSV at distal thigh           0.475     0.541     +------------------------------+----------+---------+ GSV at knee                   0.359     0.562     +------------------------------+----------+---------+ GSV prox calf                 0.474     0.557     +------------------------------+----------+---------+ GSV mid calf                  0.401     0.483     +------------------------------+----------+---------+ SSV origin                    0.297     0.334     +------------------------------+----------+---------+ SSV prox                      0.278     0.260     +------------------------------+----------+---------+ SSV mid                       0.292     0.261     +------------------------------+----------+---------+    Right Reflux Technical Findings: No evidence of DVT, SVT, or Baker's cyst. The sapheno-femoral junction is competent. No evidence of GSV reflux. No evidence of SSV reflux. Deep venous reflux is seen in the right common femoral vein. Left Reflux Technical Findings: No evidence of DVT, SVT, or Baker's cyst. The sapheno-femoral junction is competent. The GSV demonstrates reflux at the knee. The small saphenous vein demonstrates reflux at the mid calf. The common femoral vein  demonstrates deep venous reflux.   Summary: Right: No reflux was noted in the femoral vein in the thigh, popliteal vein, great saphenous vein at the saphenofemoral junction, great saphenous vein at the proximal thigh, great saphenous vein at the mid thigh, great saphenous vein at the distal thigh,  great saphenous vein at the knee, proximal small saphenous vein, and mid small saphenous vein. Abnormal reflux times were noted in the common femoral vein. Evidence of chronic venous insufficiency is detected in the deep venous system. There is no  evidence of superficial venous thrombosis. There is no evidence of deep vein thrombosis in the lower extremity. No cystic structure found in the popliteal fossa.  Left: No reflux was noted in the femoral vein in the thigh, popliteal vein, great saphenous  vein at the saphenofemoral junction, great saphenous vein at the proximal thigh, great saphenous vein at the mid thigh, great saphenous vein at the distal thigh,  and proximal small saphenous vein. Abnormal reflux times were noted in the common femoral vein, great saphenous vein at the knee, and mid small saphenous vein. Evidence of chronic venous insufficiency is detected in the great saphenous vein, small  saphenous vein, and deep venous system. There is no evidence of deep vein thrombosis in the lower extremity. There is no evidence of superficial venous thrombosis. No cystic structure found in the popliteal fossa.     Assessment: Lymphedema B LE Abnormal reflux times were noted in the common femoral vein, great saphenous vein at the knee, and mid small saphenous vein. Evidence of chronic venous insufficiency is detected in the great saphenous vein, small saphenous vein, and deep venous system.  Plan: I recommend thigh high compression garments 20-30 mmhg to be worn daily.  Elevation of B LE above the level of her heart if she tolerates with her back pain.  I recommend she address her lumbar issues, wear  the compression and f/u as needed in regards to her left LE reflux.  Keep feet clean and dry with lotion daily to prevent wounds.  Roxy Horseman PA-C [facility] [place] Office: [phone]  MD in clinic [place]

## 2019-06-22 ENCOUNTER — Encounter: Payer: Self-pay | Admitting: Family Medicine

## 2019-06-29 ENCOUNTER — Ambulatory Visit (INDEPENDENT_AMBULATORY_CARE_PROVIDER_SITE_OTHER): Payer: Medicare Other | Admitting: Podiatry

## 2019-06-29 ENCOUNTER — Other Ambulatory Visit: Payer: Self-pay

## 2019-06-29 ENCOUNTER — Encounter: Payer: Self-pay | Admitting: Podiatry

## 2019-06-29 DIAGNOSIS — B351 Tinea unguium: Secondary | ICD-10-CM | POA: Diagnosis not present

## 2019-06-29 DIAGNOSIS — M79676 Pain in unspecified toe(s): Secondary | ICD-10-CM

## 2019-06-29 NOTE — Patient Instructions (Signed)
Diabetes Mellitus and Foot Care Foot care is an important part of your health, especially when you have diabetes. Diabetes may cause you to have problems because of poor blood flow (circulation) to your feet and legs, which can cause your skin to:  Become thinner and drier.  Break more easily.  Heal more slowly.  Peel and crack. You may also have nerve damage (neuropathy) in your legs and feet, causing decreased feeling in them. This means that you may not notice minor injuries to your feet that could lead to more serious problems. Noticing and addressing any potential problems early is the best way to prevent future foot problems. How to care for your feet Foot hygiene  Wash your feet daily with warm water and mild soap. Do not use hot water. Then, pat your feet and the areas between your toes until they are completely dry. Do not soak your feet as this can dry your skin.  Trim your toenails straight across. Do not dig under them or around the cuticle. File the edges of your nails with an emery board or nail file.  Apply a moisturizing lotion or petroleum jelly to the skin on your feet and to dry, brittle toenails. Use lotion that does not contain alcohol and is unscented. Do not apply lotion between your toes. Shoes and socks  Wear clean socks or stockings every day. Make sure they are not too tight. Do not wear knee-high stockings since they may decrease blood flow to your legs.  Wear shoes that fit properly and have enough cushioning. Always look in your shoes before you put them on to be sure there are no objects inside.  To break in new shoes, wear them for just a few hours a day. This prevents injuries on your feet. Wounds, scrapes, corns, and calluses  Check your feet daily for blisters, cuts, bruises, sores, and redness. If you cannot see the bottom of your feet, use a mirror or ask someone for help.  Do not cut corns or calluses or try to remove them with medicine.  If you  find a minor scrape, cut, or break in the skin on your feet, keep it and the skin around it clean and dry. You may clean these areas with mild soap and water. Do not clean the area with peroxide, alcohol, or iodine.  If you have a wound, scrape, corn, or callus on your foot, look at it several times a day to make sure it is healing and not infected. Check for: ? Redness, swelling, or pain. ? Fluid or blood. ? Warmth. ? Pus or a bad smell. General instructions  Do not cross your legs. This may decrease blood flow to your feet.  Do not use heating pads or hot water bottles on your feet. They may burn your skin. If you have lost feeling in your feet or legs, you may not know this is happening until it is too late.  Protect your feet from hot and cold by wearing shoes, such as at the beach or on hot pavement.  Schedule a complete foot exam at least once a year (annually) or more often if you have foot problems. If you have foot problems, report any cuts, sores, or bruises to your health care provider immediately. Contact a health care provider if:  You have a medical condition that increases your risk of infection and you have any cuts, sores, or bruises on your feet.  You have an injury that is not   healing.  You have redness on your legs or feet.  You feel burning or tingling in your legs or feet.  You have pain or cramps in your legs and feet.  Your legs or feet are numb.  Your feet always feel cold.  You have pain around a toenail. Get help right away if:  You have a wound, scrape, corn, or callus on your foot and: ? You have pain, swelling, or redness that gets worse. ? You have fluid or blood coming from the wound, scrape, corn, or callus. ? Your wound, scrape, corn, or callus feels warm to the touch. ? You have pus or a bad smell coming from the wound, scrape, corn, or callus. ? You have a fever. ? You have a red line going up your leg. Summary  Check your feet every day  for cuts, sores, red spots, swelling, and blisters.  Moisturize feet and legs daily.  Wear shoes that fit properly and have enough cushioning.  If you have foot problems, report any cuts, sores, or bruises to your health care provider immediately.  Schedule a complete foot exam at least once a year (annually) or more often if you have foot problems. This information is not intended to replace advice given to you by your health care provider. Make sure you discuss any questions you have with your health care provider. Document Released: 07/04/2000 Document Revised: 08/19/2017 Document Reviewed: 08/08/2016 Elsevier Patient Education  2020 [employer]  

## 2019-07-07 NOTE — Progress Notes (Signed)
Subjective: [name] presents today with history of diabetes and neuropathy. Patient seen for follow up of chronic, painful mycotic toenails  which interfere with daily activities and routine tasks.  Pain is aggravated when wearing enclosed shoe gear. Pain is getting progressively worse and relieved with periodic professional debridement.   Mullis, Kiersten P, DO is her PCP.  Current Outpatient Medications on File Prior to Visit  Medication Sig Dispense Refill  . traMADol (ULTRAM) 50 MG tablet Take by mouth.    Marland Kitchen ACCU-CHEK AVIVA PLUS test strip USE  TO CHECK BLOOD SUGAR THREE TIMES DAILY 300 each 11  . ACCU-CHEK SOFTCLIX LANCETS lancets USE  TO CHECK BLOOD SUGAR THREE TIMES DAILY 300 each 11  . albuterol (PROVENTIL HFA;VENTOLIN HFA) 108 (90 Base) MCG/ACT inhaler Inhale 1-2 puffs into the lungs every 6 (six) hours as needed for wheezing. 2 Inhaler 2  . albuterol (PROVENTIL) (2.5 MG/3ML) 0.083% nebulizer solution Take 3 mLs (2.5 mg total) by nebulization every 4 (four) hours as needed for wheezing. 75 mL 2  . Alcohol Swabs (B-D SINGLE USE SWABS REGULAR) PADS USE  TO  CLEAN  SKIN  BEFORE CHECKING BLOOD SUGAR 300 each 11  . alendronate (FOSAMAX) 70 MG tablet TAKE 1 TABLET BY MOUTH ONE TIME PER WEEK 12 tablet 0  . aspirin EC 81 MG tablet Take 81 mg by mouth daily.    Marland Kitchen atorvastatin (LIPITOR) 10 MG tablet Take 1 tablet (10 mg total) by mouth daily. 90 tablet 3  . baclofen (LIORESAL) 20 MG tablet TAKE 1 TABLET (20 MG TOTAL) BY MOUTH 3 (THREE) TIMES DAILY AS NEEDED FOR MUSCLE SPASMS. 270 tablet 0  . Calcium Carbonate-Vitamin D (CALCIUM-D PO) Take 1 tablet by mouth daily.    . diazepam (VALIUM) 10 MG tablet TAKE 1 TABLET (10 MG TOTAL) BY MOUTH ONCE FOR 1 DOSE TAKE 30 MINUTES PRIOR TO MRI    . docusate sodium (COLACE) 250 MG capsule Take 250 mg by mouth daily.    . DULoxetine (CYMBALTA) 60 MG capsule Take 1 capsule (60 mg total) by mouth daily. 90 capsule 3  . gabapentin (NEURONTIN) 800 MG tablet  TAKE 1 TABLET BY MOUTH THREE TIMES A DAY 270 tablet 0  . JANUVIA 100 MG tablet TAKE 1 TABLET BY MOUTH EVERY DAY IN THE EVENING (Patient taking differently: Take 100 mg by mouth every evening. ) 90 tablet 3  . loperamide (IMODIUM A-D) 2 MG tablet Take 2 tablets (4 mg total) by mouth 3 (three) times daily as needed for diarrhea or loose stools. 30 tablet 0  . loratadine (CLARITIN) 10 MG tablet Take 10 mg by mouth daily.    . meclizine (ANTIVERT) 12.5 MG tablet TAKE 1 TABLET 2 TIMES DAILY AS NEEDED FOR DIZZINESS. (Patient taking differently: Take 12.5 mg by mouth 3 (three) times daily. ) 180 tablet 0  . meloxicam (MOBIC) 15 MG tablet TAKE 1 TABLET BY MOUTH EVERY DAY 30 tablet 2  . metFORMIN (GLUCOPHAGE) 1000 MG tablet TAKE 1 TABLET (1,000 MG TOTAL) BY MOUTH 2 (TWO) TIMES DAILY WITH A MEAL. 180 tablet 0  . Multiple Vitamins-Minerals ([employer] SPECTRAVITE WOMENS SENIOR PO) Take 1 tablet by mouth daily.     Marland Kitchen omeprazole (PRILOSEC) 20 MG capsule TAKE 1 CAPSULE BY MOUTH EVERY DAY IN THE EVENING 90 capsule 2  . ondansetron (ZOFRAN) 4 MG tablet Take 1 tablet (4 mg total) by mouth every 8 (eight) hours as needed for nausea or vomiting. (Patient taking differently: Take 4 mg  by mouth 3 (three) times daily. ) 30 tablet 2  . oxyCODONE (ROXICODONE) 5 MG immediate release tablet Take 1 tablet (5 mg total) by mouth every 8 (eight) hours as needed. 20 tablet 0  . potassium chloride (KLOR-CON) 10 MEQ tablet TAKE 4 TABLETS (40 MEQ TOTAL) BY MOUTH ONCE FOR 1 DOSE    . silver sulfADIAZINE (SILVADENE) 1 % cream Apply 1 application topically daily. (Patient taking differently: Apply 1 application topically 2 (two) times daily. ) 50 g 1  . triamcinolone (KENALOG) 0.025 % cream Apply 1 application topically 3 (three) times daily as needed (rash).     . [DISCONTINUED] meloxicam (MOBIC) 15 MG tablet TAKE 1 TABLET BY MOUTH EVERY DAY (Patient taking differently: Take 15 mg by mouth daily. ) 30 tablet 2  . [DISCONTINUED] omeprazole  (PRILOSEC) 20 MG capsule TAKE 1 CAPSULE BY MOUTH EVERY DAY IN THE EVENING (Patient taking differently: Take 20 mg by mouth every evening. ) 90 capsule 0   No current facility-administered medications on file prior to visit.    Allergies  Allergen Reactions  . Hydrocodone-Acetaminophen Nausea And Vomiting   Objective: There were no vitals filed for this visit.  Vascular Examination: Capillary refill time to digits <5 seconds b/l.   Dorsalis pedis pulses faintly palpable b/l.  Posterior tibial pulses faintly palpable b/l.  No digital hair x 10 digits.  Skin temperature WNL b/l.  +1 pitting edema b/l LE.  Dependent rubor noted.   Dermatological Examination: Skin with normal turgor, texture and tone b/l.  Toenails 1-5 b/l discolored, thick, dystrophic with subungual debris and pain with palpation to nailbeds due to thickness of nails. Incurvated nailplate b/l great toes with tenderness to palpation. No erythema, no edema, no drainage noted.  Musculoskeletal: Muscle strength 5/5 to all LE muscle groups.  Neurological: Sensation diminished with 10 gram monofilament b/l.   Assessment: 1. Painful onychomycosis toenails 1-5 b/l 2. NIDDM with neuropathy   Plan: 1. Toenails 1-5 b/l were debrided in length and girth without iatrogenic bleeding. 2. Patient to continue soft, supportive shoe gear daily. 3. Patient to report any pedal injuries to medical professional immediately. 4. Follow up 3 months.  5. Patient/POA to call should there be a concern in the interim.

## 2019-07-08 ENCOUNTER — Encounter: Payer: Medicare Other | Admitting: Gastroenterology

## 2019-07-11 ENCOUNTER — Other Ambulatory Visit: Payer: Self-pay

## 2019-07-11 ENCOUNTER — Ambulatory Visit
Admission: RE | Admit: 2019-07-11 | Discharge: 2019-07-11 | Disposition: A | Discharge status: Home / Self Care | Payer: Medicare Other | Source: Ambulatory Visit | Attending: Family Medicine | Admitting: Family Medicine

## 2019-07-11 DIAGNOSIS — Z1231 Encounter for screening mammogram for malignant neoplasm of breast: Secondary | ICD-10-CM

## 2019-07-12 ENCOUNTER — Other Ambulatory Visit: Payer: Self-pay

## 2019-07-12 ENCOUNTER — Ambulatory Visit (AMBULATORY_SURGERY_CENTER): Payer: Medicare Other | Admitting: *Deleted

## 2019-07-12 VITALS — Temp 97.1°F | Ht 65.0 in | Wt 242.0 lb

## 2019-07-12 DIAGNOSIS — Z86010 Personal history of colonic polyps: Secondary | ICD-10-CM

## 2019-07-12 DIAGNOSIS — Z1159 Encounter for screening for other viral diseases: Secondary | ICD-10-CM

## 2019-07-12 MED ORDER — NA SULFATE-K SULFATE-MG SULF 17.5-3.13-1.6 GM/177ML PO SOLN: ORAL | 0 refills | Status: DC

## 2019-07-12 NOTE — Progress Notes (Signed)
Patient is here in-person for PV via wheel chair, patient was able to stand to get weight. Patient's daughter with her in PV (temp 96.9). Patient denies any allergies to eggs or soy. Patient denies any problems with anesthesia/sedation. Patient denies any oxygen use at home. Patient denies taking any diet/weight loss medications or blood thinners. Patient is not being treated for MRSA or C-diff. EMMI education assisgned to the patient for the procedure, this was explained and instructions given to patient. COVID-19 screening test is on 12/31 before 12 noon at [facility], the pt is aware. Pt is aware that care partner will wait in the car during procedure; if they feel like they will be too hot or cold to wait in the car; they may wait in the 4 th floor lobby. Patient is aware to bring only one care partner. We want them to wear a mask (we do not have any that we can provide them), practice social distancing, and we will check their temperatures when they get here.  I did remind the patient that their care partner needs to stay in the parking lot the entire time and have a cell phone available, we will call them when the pt is ready for discharge. Patient will wear mask into building.

## 2019-07-13 ENCOUNTER — Encounter: Payer: Self-pay | Admitting: Gastroenterology

## 2019-07-21 ENCOUNTER — Other Ambulatory Visit
Admission: RE | Admit: 2019-07-21 | Discharge: 2019-07-21 | Disposition: A | Discharge status: Home / Self Care | Payer: Medicare Other | Source: Ambulatory Visit | Attending: Gastroenterology | Admitting: Gastroenterology

## 2019-07-21 DIAGNOSIS — Z20828 Contact with and (suspected) exposure to other viral communicable diseases: Secondary | ICD-10-CM | POA: Diagnosis not present

## 2019-07-21 DIAGNOSIS — Z01812 Encounter for preprocedural laboratory examination: Secondary | ICD-10-CM | POA: Insufficient documentation

## 2019-07-22 LAB — SARS CORONAVIRUS 2 (TAT 6-24 HRS): SARS Coronavirus 2: NEGATIVE

## 2019-07-26 ENCOUNTER — Other Ambulatory Visit: Payer: Self-pay

## 2019-07-26 ENCOUNTER — Encounter: Payer: Self-pay | Admitting: Gastroenterology

## 2019-07-26 ENCOUNTER — Ambulatory Visit (AMBULATORY_SURGERY_CENTER): Payer: Medicare Other | Admitting: Gastroenterology

## 2019-07-26 VITALS — BP 129/72 | HR 76 | Temp 97.8°F | Resp 22 | Ht 65.0 in | Wt 242.0 lb

## 2019-07-26 DIAGNOSIS — D125 Benign neoplasm of sigmoid colon: Secondary | ICD-10-CM

## 2019-07-26 DIAGNOSIS — Z86010 Personal history of colonic polyps: Secondary | ICD-10-CM | POA: Diagnosis not present

## 2019-07-26 MED ORDER — SODIUM CHLORIDE 0.9 % IV SOLN: 500.0000 mL | Freq: Once | INTRAVENOUS | Status: DC

## 2019-07-26 NOTE — Progress Notes (Signed)
PT taken to PACU. Monitors in place. VSS. Report given to RN. 

## 2019-07-26 NOTE — Patient Instructions (Signed)
Please read handouts provided. Continue present medications. Await pathology results. No ibuprofen, naproxen, or other non-steriodal anti-inflammatory drugs for 2 weeks. Repeat colonoscopy due to limited prep in the upcoming months.       YOU HAD AN ENDOSCOPIC PROCEDURE TODAY AT [facility]:   Refer to the procedure report that was given to you for any specific questions about what was found during the examination.  If the procedure report does not answer your questions, please call your gastroenterologist to clarify.  If you requested that your care partner not be given the details of your procedure findings, then the procedure report has been included in a sealed envelope for you to review at your convenience later.  YOU SHOULD EXPECT: Some feelings of bloating in the abdomen. Passage of more gas than usual.  Walking can help get rid of the air that was put into your GI tract during the procedure and reduce the bloating. If you had a lower endoscopy (such as a colonoscopy or flexible sigmoidoscopy) you may notice spotting of blood in your stool or on the toilet paper. If you underwent a bowel prep for your procedure, you may not have a normal bowel movement for a few days.  Please Note:  You might notice some irritation and congestion in your nose or some drainage.  This is from the oxygen used during your procedure.  There is no need for concern and it should clear up in a day or so.  SYMPTOMS TO REPORT IMMEDIATELY:   Following lower endoscopy (colonoscopy or flexible sigmoidoscopy):  Excessive amounts of blood in the stool  Significant tenderness or worsening of abdominal pains  Swelling of the abdomen that is new, acute  Fever of 100F or higher   For urgent or emergent issues, a gastroenterologist can be reached at any hour by calling [phone].   DIET:  We do recommend a small meal at first, but then you may proceed to your regular diet.  Drink plenty of  fluids but you should avoid alcoholic beverages for 24 hours.  ACTIVITY:  You should plan to take it easy for the rest of today and you should NOT DRIVE or use heavy machinery until tomorrow (because of the sedation medicines used during the test).    FOLLOW UP: Our staff will call the number listed on your records 48-72 hours following your procedure to check on you and address any questions or concerns that you may have regarding the information given to you following your procedure. If we do not reach you, we will leave a message.  We will attempt to reach you two times.  During this call, we will ask if you have developed any symptoms of COVID 19. If you develop any symptoms (ie: fever, flu-like symptoms, shortness of breath, cough etc.) before then, please call [phone].  If you test positive for Covid 19 in the 2 weeks post procedure, please call and report this information to [place].    If any biopsies were taken you will be contacted by phone or by letter within the next 1-3 weeks.  Please call us at [phone] if you have not heard about the biopsies in 3 weeks.    SIGNATURES/CONFIDENTIALITY: You and/or your care partner have signed paperwork which will be entered into your electronic medical record.  These signatures attest to the fact that that the information above on your After Visit Summary has been reviewed and is understood.  Full responsibility of the confidentiality  of this discharge information lies with you and/or your care-partner.

## 2019-07-26 NOTE — Op Note (Signed)
[facility] Patient Name: [name] Procedure Date: 07/26/2019 9:34 AM MRN: [id number] Endoscopist: Remo Lipps P. Havery Moros , MD Age: 64 Referring MD:  Date of Birth: 1956/03/22 Gender: Female Account #: [id number] Procedure:                Colonoscopy Indications:              Surveillance: Personal history of adenomatous                            polyps on last colonoscopy 3 years ago Medicines:                Monitored Anesthesia Care Procedure:                Pre-Anesthesia Assessment:                           - Prior to the procedure, a History and Physical                            was performed, and patient medications and                            allergies were reviewed. The patient's tolerance of                            previous anesthesia was also reviewed. The risks                            and benefits of the procedure and the sedation                            options and risks were discussed with the patient.                            All questions were answered, and informed consent                            was obtained. Prior Anticoagulants: The patient has                            taken no previous anticoagulant or antiplatelet                            agents. ASA Grade Assessment: III - A patient with                            severe systemic disease. After reviewing the risks                            and benefits, the patient was deemed in                            satisfactory condition to undergo the procedure.  After obtaining informed consent, the colonoscope                            was passed under direct vision. Throughout the                            procedure, the patient's blood pressure, pulse, and                            oxygen saturations were monitored continuously. The                            Colonoscope was introduced through the anus and                            advanced to the the  cecum, identified by the                            ileocecal valve. The colonoscopy was performed                            without difficulty. The patient tolerated the                            procedure well. The quality of the bowel                            preparation was inadequate. The ileocecal valve and                            the rectum were photographed. Scope In: 9:40:12 AM Scope Out: 10:03:42 AM Scope Withdrawal Time: 0 hours 16 minutes 3 seconds  Total Procedure Duration: 0 hours 23 minutes 30 seconds  Findings:                 Hemorrhoids were found on perianal exam.                           A few medium-mouthed diverticula were found in the                            sigmoid colon.                           A moderate amount of solid stool was found in the                            entire colon, interfering with visualization. Prep                            was worst in the right colon, could not clear the                            cecum and right colon.  Three sessile polyps were found in the sigmoid                            colon. The polyps were 2 to 5 mm in size. These                            polyps were removed with a cold snare. There was                            persistent oozing at the largest polypectomy site                            after several minutes of observation. Snare tip                            coagulation was performed to the area with good                            hemostasis. Resection and retrieval were complete.                           Internal hemorrhoids were found during retroflexion.                           The sigmoid colon had a very restricted lumen,                            which took some time to traverse, using mostly                            water immersion. The exam was otherwise without                            abnormality. Complications:            No immediate complications.  Estimated blood loss:                            Minimal. Estimated Blood Loss:     Estimated blood loss was minimal. Impression:               - Preparation of the colon was inadequate for                            screening purposes.                           - Hemorrhoids found on perianal exam.                           - Diverticulosis in the sigmoid colon.                           - Restricted mobility of the left colon                           -  Three 2 to 5 mm polyps in the sigmoid colon,                            removed with a cold snare. Resected and retrieved.                            Snare tip coagulation used at the largest polyp                            site for hemostasis to treat mild persistent oozing.                           - Internal hemorrhoids.                           - The examination was otherwise normal. Recommendation:           - Patient has a contact number available for                            emergencies. The signs and symptoms of potential                            delayed complications were discussed with the                            patient. Return to normal activities tomorrow.                            Written discharge instructions were provided to the                            patient.                           - Resume previous diet.                           - Continue present medications.                           - Await pathology results.                           - No ibuprofen, naproxen, or other non-steroidal                            anti-inflammatory drugs for 2 weeks after polyp                            removal.                           - Repeat colonoscopy is warranted due to limited  prep, at some point in the upcoming months when the                            patient can do this again, would use double prep. Remo Lipps P. Mycal Conde, MD 07/26/2019 10:11:07 AM This report has been signed  electronically.

## 2019-07-26 NOTE — Progress Notes (Signed)
Pt's states no medical or surgical changes since previsit or office visit.  Temp JR VS JJ

## 2019-07-28 ENCOUNTER — Telehealth: Payer: Self-pay

## 2019-07-28 NOTE — Telephone Encounter (Signed)
  Follow up Call-  Call back number 07/26/2019  Post procedure Call Back phone  # [phone]  Permission to leave phone message Yes  Some recent data might be hidden     Patient questions:  Do you have a fever, pain , or abdominal swelling? No. Pain Score  0 *  Have you tolerated food without any problems? Yes.    Have you been able to return to your normal activities? Yes.    Do you have any questions about your discharge instructions: Diet   No. Medications  No. Follow up visit  No.  Do you have questions or concerns about your Care? No.  Actions: * If pain score is 4 or above: No action needed, pain <4.  1. Have you developed a fever since your procedure? no  2.   Have you had an respiratory symptoms (SOB or cough) since your procedure? no  3.   Have you tested positive for COVID 19 since your procedure no  4.   Have you had any family members/close contacts diagnosed with the COVID 19 since your procedure?  no   If yes to any of these questions please route to Joylene John, RN and Alphonsa Gin, Therapist, sports.

## 2019-07-31 ENCOUNTER — Other Ambulatory Visit: Payer: Self-pay | Admitting: Family Medicine

## 2019-07-31 DIAGNOSIS — H8110 Benign paroxysmal vertigo, unspecified ear: Secondary | ICD-10-CM

## 2019-08-05 DIAGNOSIS — M48061 Spinal stenosis, lumbar region without neurogenic claudication: Secondary | ICD-10-CM | POA: Insufficient documentation

## 2019-08-18 ENCOUNTER — Other Ambulatory Visit: Payer: Self-pay | Admitting: *Deleted

## 2019-08-18 DIAGNOSIS — H8110 Benign paroxysmal vertigo, unspecified ear: Secondary | ICD-10-CM

## 2019-08-18 MED ORDER — MECLIZINE HCL 12.5 MG PO TABS: ORAL_TABLET | ORAL | 1 refills | Status: DC

## 2019-08-19 LAB — HM DIABETES EYE EXAM

## 2019-08-26 DIAGNOSIS — Z9889 Other specified postprocedural states: Secondary | ICD-10-CM | POA: Insufficient documentation

## 2019-08-26 DIAGNOSIS — M5416 Radiculopathy, lumbar region: Secondary | ICD-10-CM | POA: Insufficient documentation

## 2019-08-29 ENCOUNTER — Other Ambulatory Visit: Payer: Self-pay | Admitting: Family Medicine

## 2019-08-29 DIAGNOSIS — M25561 Pain in right knee: Secondary | ICD-10-CM

## 2019-08-30 ENCOUNTER — Other Ambulatory Visit: Payer: Self-pay | Admitting: *Deleted

## 2019-08-30 DIAGNOSIS — M25561 Pain in right knee: Secondary | ICD-10-CM

## 2019-08-30 DIAGNOSIS — G8929 Other chronic pain: Secondary | ICD-10-CM

## 2019-08-30 MED ORDER — MELOXICAM 15 MG PO TABS: 15.0000 mg | ORAL_TABLET | Freq: Every day | ORAL | 2 refills | Status: DC

## 2019-09-05 ENCOUNTER — Other Ambulatory Visit: Payer: Self-pay | Admitting: Family Medicine

## 2019-09-05 DIAGNOSIS — M8580 Other specified disorders of bone density and structure, unspecified site: Secondary | ICD-10-CM

## 2019-09-05 DIAGNOSIS — E119 Type 2 diabetes mellitus without complications: Secondary | ICD-10-CM

## 2019-09-05 DIAGNOSIS — G894 Chronic pain syndrome: Secondary | ICD-10-CM

## 2019-09-05 DIAGNOSIS — M25561 Pain in right knee: Secondary | ICD-10-CM

## 2019-09-06 MED ORDER — ALENDRONATE SODIUM 70 MG PO TABS: 70.0000 mg | ORAL_TABLET | ORAL | 1 refills | Status: DC

## 2019-09-06 MED ORDER — GABAPENTIN 800 MG PO TABS: 800.0000 mg | ORAL_TABLET | Freq: Three times a day (TID) | ORAL | 1 refills | Status: DC

## 2019-09-06 MED ORDER — BACLOFEN 20 MG PO TABS: 20.0000 mg | ORAL_TABLET | Freq: Three times a day (TID) | ORAL | 1 refills | Status: DC

## 2019-09-06 MED ORDER — METFORMIN HCL 1000 MG PO TABS: 1000.0000 mg | ORAL_TABLET | Freq: Two times a day (BID) | ORAL | 1 refills | Status: DC

## 2019-09-07 DIAGNOSIS — T148XXD Other injury of unspecified body region, subsequent encounter: Secondary | ICD-10-CM | POA: Insufficient documentation

## 2019-09-14 ENCOUNTER — Telehealth: Payer: Self-pay

## 2019-09-14 NOTE — Telephone Encounter (Signed)
Patient calls nurse line stating her CBGs have been running higher than normal. Patient stated she has had 2 back surgeries and now has an open wound. Patient is being followed by Dr. Lacinda Axon for this. However, he stated if she doesn't get her CBGs under control the open wound will not heal. Patient takes metformin 1 gram BID and Januvia 100mg  in evenings. Sugars over the last 24 hours have been [phone]. She is over due for a1c and DM FU. Scheduled for ATC tomorrow, as this is the only time that worked for her. She is scheduled to see Dr. Lacinda Axon again on 3/3 and he is requesting a plan.

## 2019-09-14 NOTE — Telephone Encounter (Signed)
Yes I agree, patient should be evaluated. Thank you for arranging.

## 2019-09-15 ENCOUNTER — Encounter: Payer: Self-pay | Admitting: Family Medicine

## 2019-09-15 ENCOUNTER — Ambulatory Visit (INDEPENDENT_AMBULATORY_CARE_PROVIDER_SITE_OTHER): Payer: Medicare Other | Admitting: Family Medicine

## 2019-09-15 ENCOUNTER — Other Ambulatory Visit: Payer: Self-pay

## 2019-09-15 VITALS — BP 122/58 | HR 98 | Wt 236.5 lb

## 2019-09-15 DIAGNOSIS — E119 Type 2 diabetes mellitus without complications: Secondary | ICD-10-CM

## 2019-09-15 LAB — POCT GLYCOSYLATED HEMOGLOBIN (HGB A1C): HbA1c, POC (controlled diabetic range): 7.8 % — AB (ref 0.0–7.0)

## 2019-09-15 MED ORDER — OZEMPIC (0.25 OR 0.5 MG/DOSE) 2 MG/1.5ML ~~LOC~~ SOPN: 0.2500 mg | PEN_INJECTOR | SUBCUTANEOUS | 2 refills | Status: DC

## 2019-09-15 MED ORDER — KETOCONAZOLE 2 % EX SHAM: 1.0000 "application " | MEDICATED_SHAMPOO | CUTANEOUS | 0 refills | Status: DC

## 2019-09-15 MED ORDER — OZEMPIC (0.25 OR 0.5 MG/DOSE) 2 MG/1.5ML ~~LOC~~ SOPN: 0.2500 mg | PEN_INJECTOR | SUBCUTANEOUS | 0 refills | Status: DC

## 2019-09-15 NOTE — Progress Notes (Signed)
Patient educated on purpose, proper use and potential adverse effects of Ozempic.  Following instruction patient verbalized understanding of treatment plan.   Demonstrated proper inhaler technique using Ozempic demo pen.  Patient able to demonstrate proper inhaler technique using teach back method. Samples of this drug were given to the patient, quantity 1 pen, Lot Number [id number], Expiration date 08/21/2019. Ozempic was primed and able to administer first dose in office without issue.  Thank you for involving pharmacy to assist in providing this patient's care.   Drexel Iha, PharmD PGY2 Ambulatory Care Pharmacy Resident

## 2019-09-15 NOTE — Progress Notes (Addendum)
   CHIEF COMPLAINT / HPI: Accompanied by patient's daughter  Diabetes Disease Monitoring: Blood Sugar ranges(Severity) -170s to 190s at home. Medications: Januvia 100 mg, Metformin 1000 mg twice daily Compliance (Modifying factor) - yes Hypoglycemic symptoms- no On aspirin and on statin ROS: denies dizziness, diaphoresis, LOC, polyuria, polydipsia Patient has had 3 back surgeries this year and currently has open wound that is not healing.  Her current back surgeon states that she needs to get her diabetes under better control in order for her to have proper healing.  Patient is encouraged and motivated to have better healing.  PERTINENT  PMH / PSH: T2DM, osteopenia, mood disorder,  OBJECTIVE: BP (!) 122/58   Pulse 98   Wt 236 lb 8 oz (107.3 kg)   SpO2 98%   BMI 39.36 kg/m   General: NAD, pleasant, in wheelchair Neck: Supple, no LAD Respiratory: normal work of breathing Neuro: CN II-XII grossly intact Psych: AOx3, appropriate affect  ASSESSMENT / PLAN:  Diabetes mellitus type 2, uncomplicated [id number] not at goal at 7.8 today.  Discussed multiple options with patient including starting injectable GLP-1 versus initiating insulin.  Discussed risk and benefits of both medications including GI symptoms with GLP-1 and hypoglycemia with insulin.  Insulin would likely help patient get to her goal CBGs faster but given the need for daily injections and closer monitoring as well as risk of hypoglycemia patient and daughter would like to trial GLP-1 weekly injections. -Pharmacy team provided instruction on use of Ozempic and was given samples of medications -Patient is to follow-up with physician in 6 weeks to discuss her progress on these medications or sooner if she has any side effects.     [place] Lasheena Frieze, DO PGY-3, [facility]

## 2019-09-15 NOTE — Patient Instructions (Signed)
Thank you for coming to see me today. It was a pleasure! Today we talked about:   We have started you on a medication called Ozempic.  You were shown how to use this in the office today.  You will need to use this weekly.  You will be started on 0.25 mg injection weekly.  If your pharmacy does not cover this medication then please call our office and we can provide you with samples while we figure out another alternative.  I have sent in a shampoo to your pharmacy to use for your dry scalp.  Please let us know if this is not helping.  Please follow-up with Dr. Tarry Kos in 6 weeks or sooner as needed.  If you have any questions or concerns, please do not hesitate to call the office at [phone].  Take Care,   Ellen Little Winton, DO

## 2019-09-16 ENCOUNTER — Encounter: Payer: Self-pay | Admitting: Family Medicine

## 2019-09-16 NOTE — Assessment & Plan Note (Addendum)
A1c not at goal at 7.8 today.  Discussed multiple options with patient including starting injectable GLP-1 versus initiating insulin.  Discussed risk and benefits of both medications including GI symptoms with GLP-1 and hypoglycemia with insulin.  Insulin would likely help patient get to her goal CBGs faster but given the need for daily injections and closer monitoring as well as risk of hypoglycemia patient and daughter would like to trial GLP-1 weekly injections. -Pharmacy team provided instruction on use of Ozempic and was given samples of medications -Patient is to follow-up with physician in 6 weeks to discuss her progress on these medications or sooner if she has any side effects.

## 2019-09-21 ENCOUNTER — Other Ambulatory Visit: Payer: Self-pay | Admitting: *Deleted

## 2019-09-21 ENCOUNTER — Encounter: Payer: Self-pay | Admitting: Family Medicine

## 2019-09-21 ENCOUNTER — Other Ambulatory Visit: Payer: Self-pay

## 2019-09-21 MED ORDER — ACCU-CHEK SOFTCLIX LANCETS MISC: 11 refills | Status: DC

## 2019-09-21 MED ORDER — BD SWAB SINGLE USE REGULAR PADS: MEDICATED_PAD | 11 refills | Status: DC

## 2019-09-21 MED ORDER — ACCU-CHEK AVIVA PLUS VI STRP: ORAL_STRIP | 11 refills | Status: DC

## 2019-09-22 ENCOUNTER — Other Ambulatory Visit: Payer: Self-pay | Admitting: Family Medicine

## 2019-09-22 DIAGNOSIS — E119 Type 2 diabetes mellitus without complications: Secondary | ICD-10-CM

## 2019-09-22 MED ORDER — ACCU-CHEK FASTCLIX LANCET KIT: PACK | 1 refills | Status: DC

## 2019-09-22 MED ORDER — ACCU-CHEK FASTCLIX LANCETS MISC: 11 refills | Status: DC

## 2019-09-27 ENCOUNTER — Encounter: Payer: Self-pay | Admitting: Family Medicine

## 2019-09-28 ENCOUNTER — Other Ambulatory Visit: Payer: Self-pay | Admitting: Family Medicine

## 2019-09-28 ENCOUNTER — Ambulatory Visit (INDEPENDENT_AMBULATORY_CARE_PROVIDER_SITE_OTHER): Payer: Medicare Other | Admitting: Podiatry

## 2019-09-28 ENCOUNTER — Other Ambulatory Visit: Payer: Self-pay

## 2019-09-28 DIAGNOSIS — E1142 Type 2 diabetes mellitus with diabetic polyneuropathy: Secondary | ICD-10-CM | POA: Diagnosis not present

## 2019-09-28 DIAGNOSIS — E119 Type 2 diabetes mellitus without complications: Secondary | ICD-10-CM

## 2019-09-28 DIAGNOSIS — M79676 Pain in unspecified toe(s): Secondary | ICD-10-CM

## 2019-09-28 DIAGNOSIS — B351 Tinea unguium: Secondary | ICD-10-CM | POA: Diagnosis not present

## 2019-09-28 DIAGNOSIS — I739 Peripheral vascular disease, unspecified: Secondary | ICD-10-CM | POA: Diagnosis not present

## 2019-09-28 MED ORDER — ACCU-CHEK GUIDE W/DEVICE KIT: 1.0000 | PACK | Freq: Three times a day (TID) | 2 refills | Status: DC

## 2019-09-28 NOTE — Patient Instructions (Signed)
Diabetes Mellitus and Foot Care Foot care is an important part of your health, especially when you have diabetes. Diabetes may cause you to have problems because of poor blood flow (circulation) to your feet and legs, which can cause your skin to:  Become thinner and drier.  Break more easily.  Heal more slowly.  Peel and crack. You may also have nerve damage (neuropathy) in your legs and feet, causing decreased feeling in them. This means that you may not notice minor injuries to your feet that could lead to more serious problems. Noticing and addressing any potential problems early is the best way to prevent future foot problems. How to care for your feet Foot hygiene  Wash your feet daily with warm water and mild soap. Do not use hot water. Then, pat your feet and the areas between your toes until they are completely dry. Do not soak your feet as this can dry your skin.  Trim your toenails straight across. Do not dig under them or around the cuticle. File the edges of your nails with an emery board or nail file.  Apply a moisturizing lotion or petroleum jelly to the skin on your feet and to dry, brittle toenails. Use lotion that does not contain alcohol and is unscented. Do not apply lotion between your toes. Shoes and socks  Wear clean socks or stockings every day. Make sure they are not too tight. Do not wear knee-high stockings since they may decrease blood flow to your legs.  Wear shoes that fit properly and have enough cushioning. Always look in your shoes before you put them on to be sure there are no objects inside.  To break in new shoes, wear them for just a few hours a day. This prevents injuries on your feet. Wounds, scrapes, corns, and calluses  Check your feet daily for blisters, cuts, bruises, sores, and redness. If you cannot see the bottom of your feet, use a mirror or ask someone for help.  Do not cut corns or calluses or try to remove them with medicine.  If you  find a minor scrape, cut, or break in the skin on your feet, keep it and the skin around it clean and dry. You may clean these areas with mild soap and water. Do not clean the area with peroxide, alcohol, or iodine.  If you have a wound, scrape, corn, or callus on your foot, look at it several times a day to make sure it is healing and not infected. Check for: ? Redness, swelling, or pain. ? Fluid or blood. ? Warmth. ? Pus or a bad smell. General instructions  Do not cross your legs. This may decrease blood flow to your feet.  Do not use heating pads or hot water bottles on your feet. They may burn your skin. If you have lost feeling in your feet or legs, you may not know this is happening until it is too late.  Protect your feet from hot and cold by wearing shoes, such as at the beach or on hot pavement.  Schedule a complete foot exam at least once a year (annually) or more often if you have foot problems. If you have foot problems, report any cuts, sores, or bruises to your health care provider immediately. Contact a health care provider if:  You have a medical condition that increases your risk of infection and you have any cuts, sores, or bruises on your feet.  You have an injury that is not   healing.  You have redness on your legs or feet.  You feel burning or tingling in your legs or feet.  You have pain or cramps in your legs and feet.  Your legs or feet are numb.  Your feet always feel cold.  You have pain around a toenail. Get help right away if:  You have a wound, scrape, corn, or callus on your foot and: ? You have pain, swelling, or redness that gets worse. ? You have fluid or blood coming from the wound, scrape, corn, or callus. ? Your wound, scrape, corn, or callus feels warm to the touch. ? You have pus or a bad smell coming from the wound, scrape, corn, or callus. ? You have a fever. ? You have a red line going up your leg. Summary  Check your feet every day  for cuts, sores, red spots, swelling, and blisters.  Moisturize feet and legs daily.  Wear shoes that fit properly and have enough cushioning.  If you have foot problems, report any cuts, sores, or bruises to your health care provider immediately.  Schedule a complete foot exam at least once a year (annually) or more often if you have foot problems. This information is not intended to replace advice given to you by your health care provider. Make sure you discuss any questions you have with your health care provider. Document Revised: 03/30/2019 Document Reviewed: 08/08/2016 Elsevier Patient Education  2020 Elsevier Inc.  

## 2019-09-30 ENCOUNTER — Telehealth (INDEPENDENT_AMBULATORY_CARE_PROVIDER_SITE_OTHER): Payer: Medicare Other | Admitting: Family Medicine

## 2019-09-30 ENCOUNTER — Other Ambulatory Visit: Payer: Self-pay

## 2019-09-30 DIAGNOSIS — E119 Type 2 diabetes mellitus without complications: Secondary | ICD-10-CM

## 2019-09-30 NOTE — Assessment & Plan Note (Signed)
Chronic, uncontrolled.  Currently on Januvia 100 mg daily, metformin 1000 mg twice daily, Ozempic 0.25 mg q. weekly.  Denies any adverse effects with these medications.  Continues to have elevated blood sugars in the mid to upper 200s.  Diet likely contributing and will plan to further discuss in future appointments.  Patient currently has open wound from recent surgery thus tight glucose control is indicated at this time.   - Will increase Ozempic to 0.5 mg q. Weekly with goal to titrate up to 1 mg q. weekly if indicated. -Continue Metformin 1000 mg twice daily -Continue Januvia 100 mg daily -Follow-up in 2 weeks with me via virtual appointment -Overall goal would be to add an SGLT-2.  If patient requires further glucose lowering medications then we can add [place] or Vania Rea as this is covered by her insurance.  If well controlled with current regimen, then we can transition patient off of Januvia to SGLT-2.  We will max out Ozempic and determine this plan at a later date.

## 2019-09-30 NOTE — Progress Notes (Signed)
[facility] Telemedicine Visit  Patient consented to have virtual visit. Method of visit: Video  Encounter participants: Patient: [name] - located at home Provider: [name] - [facility] at [facility] Others (if applicable): none  Chief Complaint: Diabetes follow-up  HPI:  Patient presenting today for follow-up of diabetes. She is currently on Januvia 100 mg daily, Metformin 1000 mg twice daily, Ozempic 0.25 mg q. Weekly. Last A1C 7.8. She notes her blood sugars have been running high at home. Her average blood sugars range from 226-295. She has some highs in the 300-400's on one day. She does endorse occasional sweets but it isnt often. Denies any GI symptoms since starting the Ozempic.   ROS: per HPI  Pertinent PMHx: type 2 diabetes, recent back surgery with healing wound  Exam:  Respiratory: Breathing comfortably on room air, speaking in full sentences  Assessment/Plan:  Diabetes mellitus type 2, uncomplicated Chronic, uncontrolled.  Currently on Januvia 100 mg daily, metformin 1000 mg twice daily, Ozempic 0.25 mg q. weekly.  Denies any adverse effects with these medications.  Continues to have elevated blood sugars in the mid to upper 200s.  Diet likely contributing and will plan to further discuss in future appointments.  Patient currently has open wound from recent surgery thus tight glucose control is indicated at this time.   - Will increase Ozempic to 0.5 mg q. Weekly with goal to titrate up to 1 mg q. weekly if indicated. -Continue Metformin 1000 mg twice daily -Continue Januvia 100 mg daily -Follow-up in 2 weeks with me via virtual appointment -Overall goal would be to add an SGLT-2.  If patient requires further glucose lowering medications then we can add [place] or Vania Rea as this is covered by her insurance.  If well controlled with current regimen, then we can transition patient off of Januvia to SGLT-2.  We will max out Ozempic and  determine this plan at a later date.  Follow up on 10/14/19 at 10:10 am via virtual appointment for Diabetes Follow up.  Time spent during visit with patient: 20 minutes  Mina Marble, [place], PGY2 09/30/19

## 2019-10-01 ENCOUNTER — Encounter: Payer: Self-pay | Admitting: Family Medicine

## 2019-10-03 ENCOUNTER — Other Ambulatory Visit: Payer: Self-pay | Admitting: Family Medicine

## 2019-10-03 DIAGNOSIS — E119 Type 2 diabetes mellitus without complications: Secondary | ICD-10-CM

## 2019-10-03 MED ORDER — ACCU-CHEK GUIDE VI STRP: ORAL_STRIP | 12 refills | Status: DC

## 2019-10-06 ENCOUNTER — Encounter: Payer: Self-pay | Admitting: Podiatry

## 2019-10-06 NOTE — Progress Notes (Signed)
Subjective: [name] presents today for follow up of preventative diabetic foot care, for diabetic foot evaluation and painful mycotic nails b/l that are difficult to trim. Pain interferes with ambulation. Aggravating factors include wearing enclosed shoe gear. Pain is relieved with periodic professional debridement.  Patient states she had a reaction to either the 3-WEA or Foot Miracle Cream on her last visit.   Allergies  Allergen Reactions  . Hydrocodone-Acetaminophen Nausea And Vomiting    Objective: There were no vitals filed for this visit.  Pt 64 y.o. year old Caucasian female  in NAD. AAO x 3.   Vascular Examination:  Capillary refill time to digits immediate b/l. Capillary refill time to digits <4 seconds b/l. Palpable DP pulses b/l. Palpable PT pulses b/l. Faintly palpable DP pulses b/l. Faintly palpable PT pulses b/l. Pedal hair absent b/l Skin temperature gradient within normal limits b/l. Dependent rubor noted b/l LE. +1 pitting edema b/l LE.  Dermatological Examination: Pedal skin with normal turgor, texture and tone bilaterally. No open wounds bilaterally. No interdigital macerations bilaterally. Toenails 1-5 b/l elongated, dystrophic, thickened, crumbly with subungual debris and tenderness to dorsal palpation.  Musculoskeletal: Normal muscle strength 5/5 to all lower extremity muscle groups bilaterally, no gross bony deformities bilaterally and no pain crepitus or joint limitation noted with ROM b/l  Neurological: Protective sensation diminished with 10g monofilament b/l. Vibratory sensation decreased b/l  Assessment: 1. Pain due to onychomycosis of toenail   2. Diabetic peripheral neuropathy associated with type 2 diabetes mellitus ([place])   3. PVD (peripheral vascular disease) ([place])   4. Encounter for diabetic foot exam ([place])    Plan: -Diabetic foot examination performed on today's visit. -Continue diabetic foot care principles. Literature dispensed on today.   -Toenails 1-5 b/l were debrided in length and girth with sterile nail nippers and dremel without iatrogenic bleeding.  -Patient to continue soft, supportive shoe gear daily. -Patient to report any pedal injuries to medical professional immediately. -Patient/POA to call should there be question/concern in the interim.  Return in about 3 months (around 12/29/2019) for diabetic nail trim/.

## 2019-10-08 ENCOUNTER — Other Ambulatory Visit: Payer: Self-pay | Admitting: Family Medicine

## 2019-10-12 ENCOUNTER — Encounter: Payer: Self-pay | Admitting: Family Medicine

## 2019-10-12 NOTE — Progress Notes (Signed)
[facility] Telemedicine Visit  I connected with [name] on 10/16/19 at 10:10 AM EDT by a video enabled telemedicine application and verified that I am speaking with the correct person using two identifiers.     I discussed the limitations of evaluation and management by telemedicine and the availability of in person appointments.  I discussed that the purpose of this telehealth visit is to provide medical care while limiting exposure to the novel coronavirus.  The patient expressed understanding and agreed to proceed.  Patient consented to have virtual visit. Method of visit: Video  Encounter participants: Patient: [name] - located at Home Provider: [name] - located at [facility] Others (if applicable): Daughter  Chief Complaint: Diabetes Follow up  HPI:  Diabetes Follow up: Last A1C 7.8. Currently on Januvia 100mg  QD, Metformin 1000mg  BID. Ozempic was increased from 0.25mg  to 0.5mg  once a week. CBGs 150. She had one high of 282, lows of 115. Denies any CBG's >300. Denies any hypoglycemia. Denies any polyphagia, polydipsia, polyuria. She notes she tolerating the medicines well. Denies any GI upset. Due for diabetic eye exam and urine microalbumin.  Lab Results  Component Value Date   HGBA1C 7.8 (A) 09/15/2019    ROS: per HPI  Pertinent PMHx: type 2 diabetes   Exam:  Respiratory: Breathing comfortably on room air, speaking in full sentences  Assessment/Plan:  Diabetes mellitus type 2, uncomplicated Chronic, improved.  Currently on Januvia 100 mg daily, Metformin 1000 mg twice daily and Ozempic 0.5 mg q. weekly.  She is tolerating these well.  She has had much improved blood sugar control in the 150s. Denies any hypoglycemia. Only one instance of BS in 200's.  Discussed management options and we mutually agreed to continue current medications.  We will plan to follow-up in 2 months and repeat A1c at that time.  Requested patient to call if  she begins to have persistent elevated blood sugars in the 200s.  She understood and agreed to plan.  Patient also endorsed that she has had her diabetic eye exam.  We will plan to request those results at next visit. -Continue Ozempic 0.5 mg q. Weekly. -If patient develops persistently elevated blood sugars, can titrate up to 1 mg q. Weekly -Continue Metformin 1000 mg twice daily -Continue Januvia 100 mg daily -Follow-up in 2 months (12/13/19 at 3:30p) for repeat A1c and urine microalbumin -We will request diabetic eye exam records at that time  Dyslipidemia associated with type 2 diabetes mellitus ([place]) Last lipid panel in 2016. Currently on atorvastatin 10 mg daily and tolerating this well. We will plan to obtain repeat lipid panel at follow-up visit.   Adenomatous polyp Uncertain prognosis. GI following.  Plan for repeat colonoscopy in 3 months.  Will continue to follow up.   I discussed the assessment and treatment plan with the patient. They were provided an opportunity to ask questions and all were answered. They agreed with the plan and demonstrated an understanding of the instructions.  Total time: 20 minutes. This includes time spent with the patient during the visit as well as time spent before and after the visit reviewing the chart, documenting the encounter, making phone calls, reviewing studies, etc.  [name], [place], PGY2 10/16/2019 11:12 AM

## 2019-10-13 ENCOUNTER — Other Ambulatory Visit: Payer: Self-pay | Admitting: Family Medicine

## 2019-10-13 DIAGNOSIS — E119 Type 2 diabetes mellitus without complications: Secondary | ICD-10-CM

## 2019-10-13 MED ORDER — ACCU-CHEK GUIDE VI STRP: ORAL_STRIP | 12 refills | Status: DC

## 2019-10-14 ENCOUNTER — Telehealth (INDEPENDENT_AMBULATORY_CARE_PROVIDER_SITE_OTHER): Payer: Medicare Other | Admitting: Family Medicine

## 2019-10-14 ENCOUNTER — Other Ambulatory Visit: Payer: Self-pay

## 2019-10-14 DIAGNOSIS — E119 Type 2 diabetes mellitus without complications: Secondary | ICD-10-CM

## 2019-10-14 DIAGNOSIS — E785 Hyperlipidemia, unspecified: Secondary | ICD-10-CM

## 2019-10-14 DIAGNOSIS — D369 Benign neoplasm, unspecified site: Secondary | ICD-10-CM | POA: Diagnosis not present

## 2019-10-14 DIAGNOSIS — E1169 Type 2 diabetes mellitus with other specified complication: Secondary | ICD-10-CM | POA: Diagnosis not present

## 2019-10-14 NOTE — Assessment & Plan Note (Signed)
Chronic, improved.  Currently on Januvia 100 mg daily, Metformin 1000 mg twice daily and Ozempic 0.5 mg q. weekly.  She is tolerating these well.  She has had much improved blood sugar control in the 150s. Denies any hypoglycemia. Only one instance of BS in 200's.  Discussed management options and we mutually agreed to continue current medications.  We will plan to follow-up in 2 months and repeat A1c at that time.  Requested patient to call if she begins to have persistent elevated blood sugars in the 200s.  She understood and agreed to plan.  Patient also endorsed that she has had her diabetic eye exam.  We will plan to request those results at next visit. -Continue Ozempic 0.5 mg q. Weekly. -If patient develops persistently elevated blood sugars, can titrate up to 1 mg q. Weekly -Continue Metformin 1000 mg twice daily -Continue Januvia 100 mg daily -Follow-up in 2 months (12/13/19 at 3:30p) for repeat A1c and urine microalbumin -We will request diabetic eye exam records at that time

## 2019-10-14 NOTE — Assessment & Plan Note (Signed)
Last lipid panel in 2016. Currently on atorvastatin 10 mg daily and tolerating this well. We will plan to obtain repeat lipid panel at follow-up visit.

## 2019-10-14 NOTE — Assessment & Plan Note (Addendum)
Uncertain prognosis. GI following.  Plan for repeat colonoscopy in 3 months.  Will continue to follow up.

## 2019-10-18 ENCOUNTER — Encounter: Payer: Self-pay | Admitting: Family Medicine

## 2019-11-03 ENCOUNTER — Other Ambulatory Visit: Payer: Self-pay | Admitting: Family Medicine

## 2019-11-07 ENCOUNTER — Encounter: Payer: Self-pay | Admitting: Family Medicine

## 2019-11-30 ENCOUNTER — Telehealth: Payer: Self-pay

## 2019-11-30 NOTE — Telephone Encounter (Signed)
Ellen Sakai, NP calls nurse line after annual home insurance visit. NP reports elevated A1C level at 6.7.   She can be reached at [phone] with any questions.   To PCP  Please advise next steps  Talbot Grumbling, RN

## 2019-12-01 NOTE — Telephone Encounter (Signed)
Attempted to contact NP Elana Ratliff to inform her I was aware of the results. Unable to leave message. I would like to continue her current medications. She has follow up scheduled for 5/25. Appreciate her help with our mutual patient.

## 2019-12-01 NOTE — Telephone Encounter (Signed)
A1C improved from 7.8 to 6.7. That is fantastic.

## 2019-12-05 ENCOUNTER — Other Ambulatory Visit: Payer: Self-pay | Admitting: Family Medicine

## 2019-12-05 DIAGNOSIS — M8580 Other specified disorders of bone density and structure, unspecified site: Secondary | ICD-10-CM

## 2019-12-12 NOTE — Progress Notes (Signed)
Subjective:   [name]    DOB: 08/30/1955, 64 y.o. female   MRN: [id number]  [name] is a 64 y.o. female with a history of mixed Alzheimer's and vascular dementia, T2DM, hyperlipidemia, GERD, BPPV, spondylosis, moderate osteopenia, DJD of lumbar spine, Vit. D deficiency, morbid obesity, urge incontinence, mood disorder, impaired functional mobility, chronic pain syndrome, abnormal TSH, adenomatous poylp here for chronic care management.  Diabetes: Last three A1C's below. Per patient, had home A1C obtained by Isaias Sakai, NP on 11/30/19 that was 6.7.Currently on Januvia 100mg  QD, Metformin 1000mg  BID, Ozempic 0.5mg  qweekly. Endorses compliance. Denies any hypoglycemia. Denies any polyuria, polydipsia, polyphagia. CBG's range from 170-180's. Low of 130's. Highest of 205. Due for diabetic eye exam and urine microalbumin ratio. She had diabetic eye exam about 6 months ago.   Lab Results  Component Value Date   HGBA1C 7.6 (A) 12/13/2019   HGBA1C 7.8 (A) 09/15/2019   HGBA1C 7.3 (A) 04/22/2019   HLD: Last lipid panel below. Currently on Atorvastatin 10mg  . Endorses compliance. Denies any muscles aches or weakness.  Lab Results  Component Value Date   CHOL 144 04/26/2015   HDL 21 (L) 04/26/2015   LDLCALC 61 04/26/2015   LDLDIRECT 103 (H) 08/10/2009   TRIG 311 (H) 04/26/2015   CHOLHDL 6.9 (H) 04/26/2015    Moderate Osteopenia: Last DEXA scan in 07/2019 with T-score persistently at -1.9 (unchanged from 2014). Patient also has history of compression fractures. Has been on Vit. D, calcium supplementation and Alendronate 70mg  q weekly since 2014.  Health Maintenance:  Due for urine microalbumin and diabetic eye exam.  Review of Systems:  Per HPI.   Objective:   BP 120/60   Pulse 94   Ht 5\' 5"  (1.651 m)   Wt 238 lb 4 oz (108.1 kg)   SpO2 98%   BMI 39.65 kg/m  Vitals and nursing note reviewed.  General: pleasant older female, sitting comfortably in wheelchair  with daughter at side, well nourished, well developed, in no acute distress with non-toxic appearance Resp: breathing comfortably on room air, speaking in full sentences MSK: in wheelchair Neuro: Alert and oriented, speech normal  Assessment & Plan:   GASTROESOPHAGEAL REFLUX, NO ESOPHAGITIS Discussed goal of transitioning from PPI to H2-blocker. Patient was amendable to doing this. Will further discuss at follow up visit in 3 months.  Dyslipidemia associated with type 2 diabetes mellitus ([facility]) Currently on Atorvastatin 10mg  QD. Will obtain lipid panel. If still above LDL goal of <70, will plan to increase.   Diabetes mellitus type 2, uncomplicated [id number] slightly improved at 7.6. Goal between 7-8. Discussed management options. Will continue current medications. Plan to RTC in 3 months for diabetes follow up. If A1C worsening, can increase Ozempic to 1mg  at that time. Patient was amendable to this plan.  - Urine microalbumin ordered - patient to send diabetic eye exam results through [facility] at earliest convenience - will discuss starting low dose ACE/ARB for kidney protection at follow up visit in 3 months  Osteopenia Last bone density scan stable. Will stop alendronate as limited benefit after 5 years. Patient counseled to take 1200mg  Calcium and 1000-2000IU Vitamin D daily.   Vitamin D deficiency Recommend 1000-2000IU Vitamin D daily  Chronic pain syndrome Patient being referred to pain management clinic. Has appointment scheduled for next week. She is requesting pain management until she can be seen.  - 10 day course of Oxy IR 5mg  q8 PRN + Methocarbamol 500mg  QID -  patient to avoid Baclofen until completion of Methocarbamol. She voiced understanding and agreement with plan.  - Patient to discontinue Meloxicam given limited benefit and kidney injury with long term use  Abnormal TSH History of abnormal TSH with normal T4. Will obtain repeat TSH.   Orders Placed This Encounter    Procedures  . Lipid panel    Standing Status:   Future    Standing Expiration Date:   12/12/2020  . Microalbumin/Creatinine Ratio, Urine    Standing Status:   Future    Standing Expiration Date:   12/12/2020  . TSH    Standing Status:   Future    Standing Expiration Date:   12/12/2020  . Basic metabolic panel    Standing Status:   Future    Standing Expiration Date:   12/12/2020  . HgB A1c   Meds ordered this encounter  Medications  . oxyCODONE (ROXICODONE) 5 MG immediate release tablet    Sig: Take 1 tablet (5 mg total) by mouth every 8 (eight) hours as needed for up to 10 days.    Dispense:  30 tablet    Refill:  0  . methocarbamol (ROBAXIN) 500 MG tablet    Sig: Take 1 tablet (500 mg total) by mouth 4 (four) times daily for 10 days.    Dispense:  40 tablet    Refill:  0  . Semaglutide,0.25 or 0.5MG /DOS, (OZEMPIC, 0.25 OR 0.5 MG/DOSE,) 2 MG/1.5ML SOPN    Sig: Inject 0.5 mg into the skin once a week.    Dispense:  3 mL    Refill:  2    Mina Marble, DO PGY-2, [facility] 12/14/2019 7:33 PM

## 2019-12-13 ENCOUNTER — Ambulatory Visit (INDEPENDENT_AMBULATORY_CARE_PROVIDER_SITE_OTHER): Payer: Medicare Other | Admitting: Family Medicine

## 2019-12-13 ENCOUNTER — Encounter: Payer: Self-pay | Admitting: Family Medicine

## 2019-12-13 ENCOUNTER — Other Ambulatory Visit: Payer: Self-pay

## 2019-12-13 VITALS — BP 120/60 | HR 94 | Ht 65.0 in | Wt 238.2 lb

## 2019-12-13 DIAGNOSIS — E559 Vitamin D deficiency, unspecified: Secondary | ICD-10-CM

## 2019-12-13 DIAGNOSIS — E1169 Type 2 diabetes mellitus with other specified complication: Secondary | ICD-10-CM

## 2019-12-13 DIAGNOSIS — M8580 Other specified disorders of bone density and structure, unspecified site: Secondary | ICD-10-CM | POA: Diagnosis not present

## 2019-12-13 DIAGNOSIS — M47816 Spondylosis without myelopathy or radiculopathy, lumbar region: Secondary | ICD-10-CM

## 2019-12-13 DIAGNOSIS — E119 Type 2 diabetes mellitus without complications: Secondary | ICD-10-CM | POA: Diagnosis not present

## 2019-12-13 DIAGNOSIS — Z7409 Other reduced mobility: Secondary | ICD-10-CM

## 2019-12-13 DIAGNOSIS — M47896 Other spondylosis, lumbar region: Secondary | ICD-10-CM

## 2019-12-13 DIAGNOSIS — K219 Gastro-esophageal reflux disease without esophagitis: Secondary | ICD-10-CM

## 2019-12-13 DIAGNOSIS — R7989 Other specified abnormal findings of blood chemistry: Secondary | ICD-10-CM | POA: Diagnosis not present

## 2019-12-13 DIAGNOSIS — G894 Chronic pain syndrome: Secondary | ICD-10-CM

## 2019-12-13 DIAGNOSIS — E785 Hyperlipidemia, unspecified: Secondary | ICD-10-CM

## 2019-12-13 LAB — POCT GLYCOSYLATED HEMOGLOBIN (HGB A1C): HbA1c, POC (controlled diabetic range): 7.6 % — AB (ref 0.0–7.0)

## 2019-12-13 MED ORDER — OZEMPIC (0.25 OR 0.5 MG/DOSE) 2 MG/1.5ML ~~LOC~~ SOPN: 0.5000 mg | PEN_INJECTOR | SUBCUTANEOUS | 2 refills | Status: DC

## 2019-12-13 MED ORDER — METHOCARBAMOL 500 MG PO TABS: 500.0000 mg | ORAL_TABLET | Freq: Four times a day (QID) | ORAL | 0 refills | Status: AC

## 2019-12-13 MED ORDER — OXYCODONE HCL 5 MG PO TABS: 5.0000 mg | ORAL_TABLET | Freq: Three times a day (TID) | ORAL | 0 refills | Status: AC | PRN

## 2019-12-13 NOTE — Patient Instructions (Addendum)
Thank you for coming to see me today. It was a pleasure to see you.   Continue diabetes medicines as prescribed. Continue your cholesterol medicine.   Please get lab work as soon as able.  Stop the Alendronate. Take 1200mg  of Calcium a day + 1000-2000 International Units (IU) of Vitamin D daily. Stop the Meloxicam (hurts your kidney's long term)  Please scan and send diabetic eye exam at earliest convenience.  Please follow-up with me in 3 months.   If you have any questions or concerns, please do not hesitate to call the office at [phone].  Take Care,   Dr. Mina Marble, DO Resident Physician [place] [facility] [phone]

## 2019-12-14 NOTE — Assessment & Plan Note (Signed)
History of abnormal TSH with normal T4. Will obtain repeat TSH.

## 2019-12-14 NOTE — Assessment & Plan Note (Addendum)
Patient being referred to pain management clinic. Has appointment scheduled for next week. She is requesting pain management until she can be seen.  - 10 day course of Oxy IR 5mg  q8 PRN + Methocarbamol 500mg  QID - patient to avoid Baclofen until completion of Methocarbamol. She voiced understanding and agreement with plan.  - Patient to discontinue Meloxicam given limited benefit and kidney injury with long term use

## 2019-12-14 NOTE — Assessment & Plan Note (Signed)
Currently on Atorvastatin 10mg  QD. Will obtain lipid panel. If still above LDL goal of <70, will plan to increase.

## 2019-12-14 NOTE — Assessment & Plan Note (Signed)
Recommend 1000-2000IU Vitamin D daily

## 2019-12-14 NOTE — Assessment & Plan Note (Deleted)
Patient being referred to pain management clinic with appointment scheduled for next week. She has requested refill of pain medication to get her through until her appointment. Her pain causes significantly impairment to her ADL's and IADL's. - 10 day course of Oxycodone 5mg  q8 PRN and Methocarbamol 500mg  QID  - patient to hold Baclofen until completing course of Methocarbamol

## 2019-12-14 NOTE — Assessment & Plan Note (Signed)
Discussed goal of transitioning from PPI to H2-blocker. Patient was amendable to doing this. Will further discuss at follow up visit in 3 months.

## 2019-12-14 NOTE — Assessment & Plan Note (Signed)
Last bone density scan stable. Will stop alendronate as limited benefit after 5 years. Patient counseled to take 1200mg  Calcium and 1000-2000IU Vitamin D daily.

## 2019-12-14 NOTE — Assessment & Plan Note (Addendum)
A1C slightly improved at 7.6. Goal between 7-8. Discussed management options. Will continue current medications. Plan to RTC in 3 months for diabetes follow up. If A1C worsening, can increase Ozempic to 1mg  at that time. Patient was amendable to this plan.  - Urine microalbumin ordered - patient to send diabetic eye exam results through MyChart at earliest convenience - will discuss starting low dose ACE/ARB for kidney protection at follow up visit in 3 months

## 2019-12-19 ENCOUNTER — Other Ambulatory Visit: Payer: Self-pay | Admitting: Family Medicine

## 2019-12-19 DIAGNOSIS — Z7409 Other reduced mobility: Secondary | ICD-10-CM

## 2019-12-19 DIAGNOSIS — M47816 Spondylosis without myelopathy or radiculopathy, lumbar region: Secondary | ICD-10-CM

## 2019-12-20 ENCOUNTER — Other Ambulatory Visit: Payer: Self-pay | Admitting: Family Medicine

## 2019-12-20 DIAGNOSIS — K219 Gastro-esophageal reflux disease without esophagitis: Secondary | ICD-10-CM

## 2019-12-21 ENCOUNTER — Other Ambulatory Visit: Payer: Medicare Other

## 2019-12-21 ENCOUNTER — Other Ambulatory Visit: Payer: Self-pay

## 2019-12-21 DIAGNOSIS — R7989 Other specified abnormal findings of blood chemistry: Secondary | ICD-10-CM

## 2019-12-21 DIAGNOSIS — E119 Type 2 diabetes mellitus without complications: Secondary | ICD-10-CM

## 2019-12-21 DIAGNOSIS — E1169 Type 2 diabetes mellitus with other specified complication: Secondary | ICD-10-CM

## 2019-12-22 ENCOUNTER — Other Ambulatory Visit: Payer: Self-pay

## 2019-12-22 DIAGNOSIS — E119 Type 2 diabetes mellitus without complications: Secondary | ICD-10-CM

## 2019-12-22 LAB — LIPID PANEL
Chol/HDL Ratio: 2.5 ratio (ref 0.0–4.4)
Cholesterol, Total: 109 mg/dL (ref 100–199)
HDL: 44 mg/dL (ref >39)
LDL Chol Calc (NIH): 44 mg/dL (ref 0–99)
Triglycerides: 116 mg/dL (ref 0–149)
VLDL Cholesterol Cal: 21 mg/dL (ref 5–40)

## 2019-12-22 LAB — BASIC METABOLIC PANEL
BUN/Creatinine Ratio: 20 (ref 12–28)
BUN: 11 mg/dL (ref 8–27)
CO2: 22 mmol/L (ref 20–29)
Calcium: 10 mg/dL (ref 8.7–10.3)
Chloride: 100 mmol/L (ref 96–106)
Creatinine, Ser: 0.55 mg/dL — ABNORMAL LOW (ref 0.57–1.00)
GFR calc Af Amer: 116 mL/min/{1.73_m2} (ref >59)
GFR calc non Af Amer: 101 mL/min/{1.73_m2} (ref >59)
Glucose: 224 mg/dL — ABNORMAL HIGH (ref 65–99)
Potassium: 4.1 mmol/L (ref 3.5–5.2)
Sodium: 138 mmol/L (ref 134–144)

## 2019-12-22 LAB — TSH: TSH: 2.72 u[IU]/mL (ref 0.450–4.500)

## 2019-12-24 LAB — MICROALBUMIN / CREATININE URINE RATIO
Creatinine, Urine: 163.9 mg/dL
Microalb/Creat Ratio: 10 mg/g creat (ref 0–29)
Microalbumin, Urine: 16.4 ug/mL

## 2020-01-04 ENCOUNTER — Ambulatory Visit (INDEPENDENT_AMBULATORY_CARE_PROVIDER_SITE_OTHER): Payer: Medicare Other | Admitting: Podiatry

## 2020-01-04 ENCOUNTER — Encounter: Payer: Self-pay | Admitting: Podiatry

## 2020-01-04 ENCOUNTER — Other Ambulatory Visit: Payer: Self-pay

## 2020-01-04 VITALS — Temp 96.8°F

## 2020-01-04 DIAGNOSIS — M79676 Pain in unspecified toe(s): Secondary | ICD-10-CM

## 2020-01-04 DIAGNOSIS — B351 Tinea unguium: Secondary | ICD-10-CM

## 2020-01-04 DIAGNOSIS — E1142 Type 2 diabetes mellitus with diabetic polyneuropathy: Secondary | ICD-10-CM

## 2020-01-04 DIAGNOSIS — I739 Peripheral vascular disease, unspecified: Secondary | ICD-10-CM | POA: Diagnosis not present

## 2020-01-04 NOTE — Patient Instructions (Signed)
Diabetes Mellitus and Foot Care Foot care is an important part of your health, especially when you have diabetes. Diabetes may cause you to have problems because of poor blood flow (circulation) to your feet and legs, which can cause your skin to:  Become thinner and drier.  Break more easily.  Heal more slowly.  Peel and crack. You may also have nerve damage (neuropathy) in your legs and feet, causing decreased feeling in them. This means that you may not notice minor injuries to your feet that could lead to more serious problems. Noticing and addressing any potential problems early is the best way to prevent future foot problems. How to care for your feet Foot hygiene  Wash your feet daily with warm water and mild soap. Do not use hot water. Then, pat your feet and the areas between your toes until they are completely dry. Do not soak your feet as this can dry your skin.  Trim your toenails straight across. Do not dig under them or around the cuticle. File the edges of your nails with an emery board or nail file.  Apply a moisturizing lotion or petroleum jelly to the skin on your feet and to dry, brittle toenails. Use lotion that does not contain alcohol and is unscented. Do not apply lotion between your toes. Shoes and socks  Wear clean socks or stockings every day. Make sure they are not too tight. Do not wear knee-high stockings since they may decrease blood flow to your legs.  Wear shoes that fit properly and have enough cushioning. Always look in your shoes before you put them on to be sure there are no objects inside.  To break in new shoes, wear them for just a few hours a day. This prevents injuries on your feet. Wounds, scrapes, corns, and calluses  Check your feet daily for blisters, cuts, bruises, sores, and redness. If you cannot see the bottom of your feet, use a mirror or ask someone for help.  Do not cut corns or calluses or try to remove them with medicine.  If you  find a minor scrape, cut, or break in the skin on your feet, keep it and the skin around it clean and dry. You may clean these areas with mild soap and water. Do not clean the area with peroxide, alcohol, or iodine.  If you have a wound, scrape, corn, or callus on your foot, look at it several times a day to make sure it is healing and not infected. Check for: ? Redness, swelling, or pain. ? Fluid or blood. ? Warmth. ? Pus or a bad smell. General instructions  Do not cross your legs. This may decrease blood flow to your feet.  Do not use heating pads or hot water bottles on your feet. They may burn your skin. If you have lost feeling in your feet or legs, you may not know this is happening until it is too late.  Protect your feet from hot and cold by wearing shoes, such as at the beach or on hot pavement.  Schedule a complete foot exam at least once a year (annually) or more often if you have foot problems. If you have foot problems, report any cuts, sores, or bruises to your health care provider immediately. Contact a health care provider if:  You have a medical condition that increases your risk of infection and you have any cuts, sores, or bruises on your feet.  You have an injury that is not   healing.  You have redness on your legs or feet.  You feel burning or tingling in your legs or feet.  You have pain or cramps in your legs and feet.  Your legs or feet are numb.  Your feet always feel cold.  You have pain around a toenail. Get help right away if:  You have a wound, scrape, corn, or callus on your foot and: ? You have pain, swelling, or redness that gets worse. ? You have fluid or blood coming from the wound, scrape, corn, or callus. ? Your wound, scrape, corn, or callus feels warm to the touch. ? You have pus or a bad smell coming from the wound, scrape, corn, or callus. ? You have a fever. ? You have a red line going up your leg. Summary  Check your feet every day  for cuts, sores, red spots, swelling, and blisters.  Moisturize feet and legs daily.  Wear shoes that fit properly and have enough cushioning.  If you have foot problems, report any cuts, sores, or bruises to your health care provider immediately.  Schedule a complete foot exam at least once a year (annually) or more often if you have foot problems. This information is not intended to replace advice given to you by your health care provider. Make sure you discuss any questions you have with your health care provider. Document Revised: 03/30/2019 Document Reviewed: 08/08/2016 Elsevier Patient Education  2020 Elsevier Inc.  

## 2020-01-14 NOTE — Progress Notes (Signed)
Subjective: [name] presents today at risk footcare. Patient has h/o diabetes, neuropathy and PAD and is seen for  and painful mycotic nails b/l that are difficult to trim. Pain interferes with ambulation. Aggravating factors include wearing enclosed shoe gear. Pain is relieved with periodic professional debridement.  Mullis, Kiersten P, DO is patient's PCP. Last visit was: 12/13/2019.  Past Medical History:  Diagnosis Date  . Allergy   . Arthritis   . COPD (chronic obstructive pulmonary disease) ([place])   . Diabetes mellitus, type 2 ([place])   . Excessive daytime sleepiness 11/12/2017  . GERD (gastroesophageal reflux disease)   . Goiter   . History of kidney stones   . Hypertension   . Kidney stone   . Memory loss   . 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 Osteoporosis    in lower back per pt   . Plantar fasciitis   . Snoring 08/28/2017  . Stroke [facility]) 15 years ago    residual left sided weakness  . Tremors of nervous system   . Uterovaginal prolapse, incomplete 06/10/2010   Qualifier: Diagnosis of  By: Zebedee Iba NP, Manuela Schwartz    . Vertigo      Current Outpatient Medications on File Prior to Visit  Medication Sig Dispense Refill  . Accu-Chek FastClix Lancets MISC USE TO CHECK BLOOD SUGAR THREE TIMES DAILY 300 each 11  . albuterol (PROVENTIL HFA;VENTOLIN HFA) 108 (90 Base) MCG/ACT inhaler Inhale 1-2 puffs into the lungs every 6 (six) hours as needed for wheezing. 2 Inhaler 2  . albuterol (PROVENTIL) (2.5 MG/3ML) 0.083% nebulizer solution Take 3 mLs (2.5 mg total) by nebulization every 4 (four) hours as needed for wheezing. 75 mL 2  . Alcohol Swabs (B-D SINGLE USE SWABS REGULAR) PADS USE  TO  CLEAN  SKIN  BEFORE CHECKING BLOOD SUGAR 300 each 11  . aspirin EC 81 MG tablet Take 81 mg by mouth daily.    Marland Kitchen atorvastatin (LIPITOR) 10 MG tablet Take 1 tablet (10 mg total) by mouth daily. 90 tablet 3  . Blood Glucose Monitoring Suppl (ACCU-CHEK GUIDE) w/Device KIT 1 Device by  Does not apply route in the morning, at noon, and at bedtime. 1 kit 2  . Calcium Carbonate-Vitamin D (CALCIUM-D PO) Take 1 tablet by mouth daily.    Marland Kitchen docusate sodium (COLACE) 250 MG capsule Take 250 mg by mouth daily.    . DULoxetine (CYMBALTA) 60 MG capsule Take 1 capsule (60 mg total) by mouth daily. 90 capsule 3  . gabapentin (NEURONTIN) 800 MG tablet Take 1 tablet (800 mg total) by mouth 3 (three) times daily. 270 tablet 1  . glucose blood (ACCU-CHEK GUIDE) test strip USE  TO CHECK BLOOD SUGAR THREE TIMES DAILY 300 each 12  . JANUVIA 100 MG tablet TAKE 1 TABLET BY MOUTH EVERY DAY IN THE EVENING (Patient taking differently: Take 100 mg by mouth every evening. ) 90 tablet 3  . ketoconazole (NIZORAL) 2 % shampoo APPLY TO AFFECTED AREA TOPICALLY 2 (TWO) TIMES A WEEK. 120 mL 0  . Lancets Misc. (ACCU-CHEK FASTCLIX LANCET) KIT USE TO CHECK BLOOD SUGAR THREE TIMES DAILY 1 kit 1  . loperamide (IMODIUM A-D) 2 MG tablet Take 2 tablets (4 mg total) by mouth 3 (three) times daily as needed for diarrhea or loose stools. 30 tablet 0  . loratadine (CLARITIN) 10 MG tablet Take 10 mg by mouth daily.    . meclizine (ANTIVERT) 12.5 MG tablet TAKE 1 TABLET 2 TIMES  DAILY AS NEEDED FOR DIZZINESS. 180 tablet 1  . metFORMIN (GLUCOPHAGE) 1000 MG tablet Take 1 tablet (1,000 mg total) by mouth 2 (two) times daily with a meal. 180 tablet 1  . methocarbamol (ROBAXIN) 500 MG tablet Take 500 mg by mouth 3 (three) times daily.    . Multiple Vitamins-Minerals ([employer] SPECTRAVITE WOMENS SENIOR PO) Take 1 tablet by mouth daily.     Marland Kitchen omeprazole (PRILOSEC) 20 MG capsule TAKE 1 CAPSULE BY MOUTH EVERY DAY IN THE EVENING 90 capsule 2  . ondansetron (ZOFRAN) 4 MG tablet Take 1 tablet (4 mg total) by mouth every 8 (eight) hours as needed for nausea or vomiting. (Patient taking differently: Take 4 mg by mouth 3 (three) times daily. ) 30 tablet 2  . Semaglutide,0.25 or 0.5MG/DOS, (OZEMPIC, 0.25 OR 0.5 MG/DOSE,) 2 MG/1.5ML SOPN Inject 0.5 mg  into the skin once a week. 3 mL 2  . traMADol (ULTRAM) 50 MG tablet Take by mouth.    . triamcinolone (KENALOG) 0.025 % cream Apply 1 application topically 3 (three) times daily as needed (rash).     . Wound Dressings (MEDIHONEY CA ALGINATE 2"X2") PADS APPLY 1 UNITS TOPICALLY ONCE DAILY    . [DISCONTINUED] meloxicam (MOBIC) 15 MG tablet TAKE 1 TABLET BY MOUTH EVERY DAY (Patient taking differently: Take 15 mg by mouth daily. ) 30 tablet 2  . [DISCONTINUED] omeprazole (PRILOSEC) 20 MG capsule TAKE 1 CAPSULE BY MOUTH EVERY DAY IN THE EVENING (Patient taking differently: Take 20 mg by mouth every evening. ) 90 capsule 0   No current facility-administered medications on file prior to visit.     Allergies  Allergen Reactions  . Hydrocodone-Acetaminophen Nausea And Vomiting    Objective: [name] is a pleasant 64 y.o. female WD, WN in NAD. AAO x 3.  Vitals:   01/04/20 1516  Temp: (!) 96.8 F (36 C)    Vascular Examination: Neurovascular status unchanged b/l lower extremities. Capillary refill time to digits <4 seconds b/l lower extremities. Faintly palpable pedal pulses b/l. Pedal hair absent. Lower extremity skin temperature gradient within normal limits. Dependent rubor noted b/l lower extremities. +1 pitting edema b/l lower extremities.  Dermatological Examination: Pedal skin with normal turgor, texture and tone bilaterally. No open wounds bilaterally. No interdigital macerations bilaterally. Toenails 1-5 b/l elongated, discolored, dystrophic, thickened, crumbly with subungual debris and tenderness to dorsal palpation.  Musculoskeletal: Normal muscle strength 5/5 to all lower extremity muscle groups bilaterally. No pain crepitus or joint limitation noted with ROM b/l. No gross bony deformities bilaterally.  Neurological Examination: Protective sensation diminished with 10g monofilament b/l. Babinski reflex negative b/l. Clonus negative b/l.  Last A1c: Hemoglobin A1C Latest Ref  Rng & Units 12/13/2019 09/15/2019 04/22/2019  HGBA1C 0.0 - 7.0 % 7.6(A) 7.8(A) 7.3(A)  Some recent data might be hidden      Assessment: 1. Pain due to onychomycosis of toenail   2. Diabetic peripheral neuropathy associated with type 2 diabetes mellitus ([place])   3. PVD (peripheral vascular disease) ([place])   Plan: -Examined patient. -No new findings. No new orders. -Continue diabetic foot care principles. -Toenails 1-5 b/l were debrided in length and girth with sterile nail nippers and dremel without iatrogenic bleeding.  -Patient to report any pedal injuries to medical professional immediately. -Patient to continue soft, supportive shoe gear daily. -Patient/POA to call should there be question/concern in the interim.  Return in about 3 months (around 04/05/2020) for diabetic nail and callus trim.  Marzetta Board, DPM

## 2020-01-29 ENCOUNTER — Encounter: Payer: Self-pay | Admitting: Family Medicine

## 2020-01-30 ENCOUNTER — Other Ambulatory Visit: Payer: Self-pay | Admitting: Student in an Organized Health Care Education/Training Program

## 2020-01-30 ENCOUNTER — Other Ambulatory Visit: Payer: Self-pay

## 2020-01-30 ENCOUNTER — Inpatient Hospital Stay
Admission: EM | Admit: 2020-01-30 | Discharge: 2020-02-03 | DRG: 390 | Disposition: A | Discharge status: Home / Self Care | Payer: Medicare Other | Attending: Internal Medicine | Admitting: Internal Medicine

## 2020-01-30 ENCOUNTER — Encounter: Payer: Self-pay | Admitting: Emergency Medicine

## 2020-01-30 ENCOUNTER — Observation Stay: Payer: Medicare Other

## 2020-01-30 ENCOUNTER — Emergency Department: Payer: Medicare Other

## 2020-01-30 DIAGNOSIS — Z8673 Personal history of transient ischemic attack (TIA), and cerebral infarction without residual deficits: Secondary | ICD-10-CM

## 2020-01-30 DIAGNOSIS — R1084 Generalized abdominal pain: Secondary | ICD-10-CM

## 2020-01-30 DIAGNOSIS — E876 Hypokalemia: Secondary | ICD-10-CM | POA: Diagnosis not present

## 2020-01-30 DIAGNOSIS — Z811 Family history of alcohol abuse and dependence: Secondary | ICD-10-CM

## 2020-01-30 DIAGNOSIS — K567 Ileus, unspecified: Secondary | ICD-10-CM

## 2020-01-30 DIAGNOSIS — Z961 Presence of intraocular lens: Secondary | ICD-10-CM | POA: Diagnosis present

## 2020-01-30 DIAGNOSIS — G894 Chronic pain syndrome: Secondary | ICD-10-CM | POA: Diagnosis present

## 2020-01-30 DIAGNOSIS — Z87442 Personal history of urinary calculi: Secondary | ICD-10-CM

## 2020-01-30 DIAGNOSIS — I10 Essential (primary) hypertension: Secondary | ICD-10-CM | POA: Diagnosis present

## 2020-01-30 DIAGNOSIS — D638 Anemia in other chronic diseases classified elsewhere: Secondary | ICD-10-CM | POA: Diagnosis present

## 2020-01-30 DIAGNOSIS — M810 Age-related osteoporosis without current pathological fracture: Secondary | ICD-10-CM | POA: Diagnosis present

## 2020-01-30 DIAGNOSIS — J449 Chronic obstructive pulmonary disease, unspecified: Secondary | ICD-10-CM

## 2020-01-30 DIAGNOSIS — Z803 Family history of malignant neoplasm of breast: Secondary | ICD-10-CM

## 2020-01-30 DIAGNOSIS — Z9841 Cataract extraction status, right eye: Secondary | ICD-10-CM

## 2020-01-30 DIAGNOSIS — K56600 Partial intestinal obstruction, unspecified as to cause: Secondary | ICD-10-CM

## 2020-01-30 DIAGNOSIS — E119 Type 2 diabetes mellitus without complications: Secondary | ICD-10-CM | POA: Diagnosis present

## 2020-01-30 DIAGNOSIS — Z7984 Long term (current) use of oral hypoglycemic drugs: Secondary | ICD-10-CM

## 2020-01-30 DIAGNOSIS — K56609 Unspecified intestinal obstruction, unspecified as to partial versus complete obstruction: Secondary | ICD-10-CM | POA: Diagnosis not present

## 2020-01-30 DIAGNOSIS — D696 Thrombocytopenia, unspecified: Secondary | ICD-10-CM | POA: Diagnosis present

## 2020-01-30 DIAGNOSIS — F0280 Dementia in other diseases classified elsewhere without behavioral disturbance: Secondary | ICD-10-CM | POA: Diagnosis present

## 2020-01-30 DIAGNOSIS — D72829 Elevated white blood cell count, unspecified: Secondary | ICD-10-CM | POA: Diagnosis present

## 2020-01-30 DIAGNOSIS — H8110 Benign paroxysmal vertigo, unspecified ear: Secondary | ICD-10-CM

## 2020-01-30 DIAGNOSIS — Z791 Long term (current) use of non-steroidal anti-inflammatories (NSAID): Secondary | ICD-10-CM

## 2020-01-30 DIAGNOSIS — E1140 Type 2 diabetes mellitus with diabetic neuropathy, unspecified: Secondary | ICD-10-CM

## 2020-01-30 DIAGNOSIS — Z8249 Family history of ischemic heart disease and other diseases of the circulatory system: Secondary | ICD-10-CM

## 2020-01-30 DIAGNOSIS — Z79899 Other long term (current) drug therapy: Secondary | ICD-10-CM

## 2020-01-30 DIAGNOSIS — R112 Nausea with vomiting, unspecified: Secondary | ICD-10-CM

## 2020-01-30 DIAGNOSIS — Z9049 Acquired absence of other specified parts of digestive tract: Secondary | ICD-10-CM

## 2020-01-30 DIAGNOSIS — G309 Alzheimer's disease, unspecified: Secondary | ICD-10-CM | POA: Diagnosis present

## 2020-01-30 DIAGNOSIS — Z90710 Acquired absence of both cervix and uterus: Secondary | ICD-10-CM

## 2020-01-30 DIAGNOSIS — Z7982 Long term (current) use of aspirin: Secondary | ICD-10-CM

## 2020-01-30 DIAGNOSIS — Z9842 Cataract extraction status, left eye: Secondary | ICD-10-CM

## 2020-01-30 DIAGNOSIS — Z20822 Contact with and (suspected) exposure to covid-19: Secondary | ICD-10-CM | POA: Diagnosis present

## 2020-01-30 DIAGNOSIS — Z96652 Presence of left artificial knee joint: Secondary | ICD-10-CM | POA: Diagnosis present

## 2020-01-30 DIAGNOSIS — K5651 Intestinal adhesions [bands], with partial obstruction: Secondary | ICD-10-CM | POA: Diagnosis not present

## 2020-01-30 DIAGNOSIS — Z833 Family history of diabetes mellitus: Secondary | ICD-10-CM

## 2020-01-30 DIAGNOSIS — Z6839 Body mass index (BMI) 39.0-39.9, adult: Secondary | ICD-10-CM

## 2020-01-30 DIAGNOSIS — K219 Gastro-esophageal reflux disease without esophagitis: Secondary | ICD-10-CM | POA: Diagnosis present

## 2020-01-30 HISTORY — DX: Partial intestinal obstruction, unspecified as to cause: K56.600

## 2020-01-30 LAB — URINALYSIS, COMPLETE (UACMP) WITH MICROSCOPIC
Bilirubin Urine: NEGATIVE
Glucose, UA: NEGATIVE mg/dL
Hgb urine dipstick: NEGATIVE
Ketones, ur: NEGATIVE mg/dL
Leukocytes,Ua: NEGATIVE
Nitrite: NEGATIVE
Protein, ur: 30 mg/dL — AB
Specific Gravity, Urine: >1.046 — ABNORMAL HIGH (ref 1.005–1.030)
pH: 5 (ref 5.0–8.0)

## 2020-01-30 LAB — COMPREHENSIVE METABOLIC PANEL
ALT: 26 U/L (ref 0–44)
AST: 40 U/L (ref 15–41)
Albumin: 3.2 g/dL — ABNORMAL LOW (ref 3.5–5.0)
Alkaline Phosphatase: 118 U/L (ref 38–126)
Anion gap: 14 (ref 5–15)
BUN: 12 mg/dL (ref 8–23)
CO2: 23 mmol/L (ref 22–32)
Calcium: 9.3 mg/dL (ref 8.9–10.3)
Chloride: 100 mmol/L (ref 98–111)
Creatinine, Ser: 0.62 mg/dL (ref 0.44–1.00)
GFR calc Af Amer: >60 mL/min (ref >60)
GFR calc non Af Amer: >60 mL/min (ref >60)
Glucose, Bld: 170 mg/dL — ABNORMAL HIGH (ref 70–99)
Potassium: 3.9 mmol/L (ref 3.5–5.1)
Sodium: 137 mmol/L (ref 135–145)
Total Bilirubin: 1.7 mg/dL — ABNORMAL HIGH (ref 0.3–1.2)
Total Protein: 8.1 g/dL (ref 6.5–8.1)

## 2020-01-30 LAB — CBC
HCT: 38.1 % (ref 36.0–46.0)
Hemoglobin: 12.5 g/dL (ref 12.0–15.0)
MCH: 28.7 pg (ref 26.0–34.0)
MCHC: 32.8 g/dL (ref 30.0–36.0)
MCV: 87.6 fL (ref 80.0–100.0)
Platelets: 207 10*3/uL (ref 150–400)
RBC: 4.35 MIL/uL (ref 3.87–5.11)
RDW: 18.5 % — ABNORMAL HIGH (ref 11.5–15.5)
WBC: 16.7 10*3/uL — ABNORMAL HIGH (ref 4.0–10.5)
nRBC: 0 % (ref 0.0–0.2)

## 2020-01-30 LAB — GLUCOSE, CAPILLARY
Glucose-Capillary: 112 mg/dL — ABNORMAL HIGH (ref 70–99)
Glucose-Capillary: 112 mg/dL — ABNORMAL HIGH (ref 70–99)

## 2020-01-30 LAB — LIPASE, BLOOD: Lipase: 27 U/L (ref 11–51)

## 2020-01-30 LAB — TROPONIN I (HIGH SENSITIVITY): Troponin I (High Sensitivity): 6 ng/L (ref <18)

## 2020-01-30 MED ORDER — ONDANSETRON HCL 4 MG PO TABS: 4.0000 mg | ORAL_TABLET | Freq: Three times a day (TID) | ORAL | 0 refills | Status: DC | PRN

## 2020-01-30 MED ORDER — ONDANSETRON HCL 4 MG PO TABS: 4.0000 mg | ORAL_TABLET | Freq: Three times a day (TID) | ORAL | 2 refills | Status: DC | PRN

## 2020-01-30 MED ORDER — IOHEXOL 300 MG/ML  SOLN
125.0000 mL | Freq: Once | INTRAMUSCULAR | Status: AC | PRN
  Administered 2020-01-30: 125 mL via INTRAVENOUS

## 2020-01-30 MED ORDER — ACETAMINOPHEN 325 MG PO TABS: 650.0000 mg | ORAL_TABLET | Freq: Four times a day (QID) | ORAL | Status: DC | PRN

## 2020-01-30 MED ORDER — LIDOCAINE HCL URETHRAL/MUCOSAL 2 % EX GEL
1.0000 "application " | Freq: Once | CUTANEOUS | Status: AC
  Administered 2020-01-30: 1
  Filled 2020-01-30: qty 10

## 2020-01-30 MED ORDER — KETOROLAC TROMETHAMINE 30 MG/ML IJ SOLN
15.0000 mg | INTRAMUSCULAR | Status: AC
  Administered 2020-01-30: 15 mg via INTRAVENOUS
  Filled 2020-01-30: qty 1

## 2020-01-30 MED ORDER — ONDANSETRON HCL 4 MG PO TABS: 4.0000 mg | ORAL_TABLET | Freq: Four times a day (QID) | ORAL | Status: DC | PRN

## 2020-01-30 MED ORDER — SODIUM CHLORIDE 0.9 % IV SOLN: INTRAVENOUS | Status: DC

## 2020-01-30 MED ORDER — HYDROMORPHONE HCL 1 MG/ML IJ SOLN
1.0000 mg | INTRAMUSCULAR | Status: AC
  Administered 2020-01-30: 1 mg via INTRAVENOUS
  Filled 2020-01-30: qty 1

## 2020-01-30 MED ORDER — INSULIN ASPART 100 UNIT/ML ~~LOC~~ SOLN
0.0000 [IU] | SUBCUTANEOUS | Status: DC
  Administered 2020-02-01: 3 [IU] via SUBCUTANEOUS
  Administered 2020-02-01: 7 [IU] via SUBCUTANEOUS
  Administered 2020-02-01 – 2020-02-02 (×4): 3 [IU] via SUBCUTANEOUS
  Administered 2020-02-02: 4 [IU] via SUBCUTANEOUS
  Administered 2020-02-02 – 2020-02-03 (×2): 3 [IU] via SUBCUTANEOUS
  Administered 2020-02-03: 7 [IU] via SUBCUTANEOUS
  Filled 2020-01-30 (×10): qty 1

## 2020-01-30 MED ORDER — ENOXAPARIN SODIUM 40 MG/0.4ML ~~LOC~~ SOLN
40.0000 mg | Freq: Two times a day (BID) | SUBCUTANEOUS | Status: DC
  Administered 2020-01-30 – 2020-02-03 (×8): 40 mg via SUBCUTANEOUS
  Filled 2020-01-30 (×8): qty 0.4

## 2020-01-30 MED ORDER — ONDANSETRON HCL 4 MG/2ML IJ SOLN
4.0000 mg | Freq: Once | INTRAMUSCULAR | Status: AC
  Administered 2020-01-30: 4 mg via INTRAVENOUS
  Filled 2020-01-30: qty 2

## 2020-01-30 MED ORDER — SODIUM CHLORIDE 0.9 % IV BOLUS
1000.0000 mL | Freq: Once | INTRAVENOUS | Status: AC
  Administered 2020-01-30: 1000 mL via INTRAVENOUS

## 2020-01-30 MED ORDER — ONDANSETRON HCL 4 MG/2ML IJ SOLN: 4.0000 mg | Freq: Four times a day (QID) | INTRAMUSCULAR | Status: DC | PRN

## 2020-01-30 MED ORDER — MORPHINE SULFATE (PF) 2 MG/ML IV SOLN
2.0000 mg | INTRAVENOUS | Status: DC | PRN
  Administered 2020-01-31 – 2020-02-03 (×6): 2 mg via INTRAVENOUS
  Filled 2020-01-30 (×6): qty 1

## 2020-01-30 MED ORDER — ACETAMINOPHEN 650 MG RE SUPP: 650.0000 mg | Freq: Four times a day (QID) | RECTAL | Status: DC | PRN

## 2020-01-30 NOTE — ED Provider Notes (Signed)
[facility] Emergency Department Provider Note  ____________________________________________  Time seen: Approximately 7:24 PM  I have reviewed the triage vital signs and the nursing notes.   HISTORY  Chief Complaint Abdominal Pain    HPI [name] is a 64 y.o. female with a history of COPD diabetes GERD kidney stone hypertension and prior stroke who comes the ED complaining of epigastric pain, gradual onset about 2 days ago.  Constant, nonradiating, worse with movement and eating.  No alleviating factors.  Associated with nausea but no vomiting or diarrhea.  Pain is rated as severe.  Patient is status post appendectomy, cholecystectomy about 6 years ago, and hysterectomy      Past Medical History:  Diagnosis Date  . Allergy   . Arthritis   . COPD (chronic obstructive pulmonary disease) ([place])   . Diabetes mellitus, type 2 ([place])   . Excessive daytime sleepiness 11/12/2017  . GERD (gastroesophageal reflux disease)   . Goiter   . History of kidney stones   . Hypertension   . Kidney stone   . Memory loss   . Mixed Alzheimer's and vascular dementia ([place]) 08/28/2017  . Osteoporosis    in lower back per pt   . Plantar fasciitis   . Snoring 08/28/2017  . Stroke [facility]) 15 years ago    residual left sided weakness  . Tremors of nervous system   . Uterovaginal prolapse, incomplete 06/10/2010   Qualifier: Diagnosis of  By: Zebedee Iba NP, Manuela Schwartz    . Vertigo      Patient Active Problem List   Diagnosis Date Noted  . Partial small bowel obstruction ([place]) 01/30/2020  . COPD with chronic bronchitis ([place]) 01/30/2020  . Adenomatous polyp 10/14/2019  . Mixed Alzheimer's and vascular dementia ([place]) 08/28/2017  . Abnormal TSH 08/28/2017  . Memory loss 08/10/2017  . Urge incontinence 07/25/2016  . Impaired functional mobility, balance, and endurance 07/24/2016  . 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 Dyslipidemia  associated with type 2 diabetes mellitus ([place]) 04/26/2015  . Benign paroxysmal positional vertigo 01/24/2015  . Diabetes mellitus type 2, uncomplicated ([place]) 88/87/5797  . Vitamin D deficiency 07/11/2013  . Mood disorder ([place]) 07/11/2013  . Osteopenia 11/04/2012  . 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 Chronic pain syndrome 04/26/2010  . Morbid obesity ([place]) 09/17/2006  . GASTROESOPHAGEAL REFLUX, NO ESOPHAGITIS 09/17/2006     Past Surgical History:  Procedure Laterality Date  . APPENDECTOMY    . BLADDER SURGERY     x2  . CATARACT EXTRACTION W/PHACO Left 05/06/2017   Procedure: CATARACT EXTRACTION PHACO AND INTRAOCULAR LENS PLACEMENT ([place]) LEFT DIABETIC;  Surgeon: Leandrew Koyanagi, MD;  Location: [place];  Service: Ophthalmology;  Laterality: Left;  Diabetic - oral meds  . CATARACT EXTRACTION W/PHACO Right 06/03/2017   Procedure: CATARACT EXTRACTION PHACO AND INTRAOCULAR LENS PLACEMENT ([place]) RIGHT DIABETIC;  Surgeon: Leandrew Koyanagi, MD;  Location: [place];  Service: Ophthalmology;  Laterality: Right;  Diabetic - oral meds  . CHOLECYSTECTOMY    . COLONOSCOPY  03/14/2016  . FOOT SURGERY    . KYPHOPLASTY N/A 04/07/2019   Procedure: L4 KYPHOPLASTY;  Surgeon: Hessie Knows, MD;  Location: [facility];  Service: Orthopedics;  Laterality: N/A;  . TOTAL ABDOMINAL HYSTERECTOMY    . TOTAL KNEE ARTHROPLASTY Left 09/21/2018   Procedure: TOTAL KNEE ARTHROPLASTY-LEFT NEEDS RNFA;  Surgeon: Hessie Knows, MD;  Location: [facility];  Service: Orthopedics;  Laterality: Left;  Prior to Admission medications   Medication Sig Start Date End Date Taking? Authorizing Provider  Accu-Chek FastClix Lancets MISC USE TO CHECK BLOOD SUGAR THREE TIMES DAILY 09/22/19   Mullis, Kiersten P, DO  albuterol (PROVENTIL HFA;VENTOLIN HFA) 108 (90 Base) MCG/ACT inhaler Inhale 1-2 puffs into the lungs every 6 (six) hours as needed for wheezing. 12/25/17   Verner Mould, MD  albuterol (PROVENTIL) (2.5 MG/3ML) 0.083% nebulizer solution Take 3 mLs (2.5 mg total) by nebulization every 4 (four) hours as needed for wheezing. 02/17/19   Mullis, Kiersten P, DO  Alcohol Swabs (B-D SINGLE USE SWABS REGULAR) PADS USE  TO  CLEAN  SKIN  BEFORE CHECKING BLOOD SUGAR 09/21/19   Mullis, Kiersten P, DO  aspirin EC 81 MG tablet Take 81 mg by mouth daily.    [provider]  atorvastatin (LIPITOR) 10 MG tablet Take 1 tablet (10 mg total) by mouth daily. 02/17/19   Mullis, Kiersten P, DO  Blood Glucose Monitoring Suppl (ACCU-CHEK GUIDE) w/Device KIT 1 Device by Does not apply route in the morning, at noon, and at bedtime. 09/28/19   Mullis, Kiersten P, DO  Calcium Carbonate-Vitamin D (CALCIUM-D PO) Take 1 tablet by mouth daily.    [provider]  docusate sodium (COLACE) 250 MG capsule Take 250 mg by mouth daily.    [provider]  DULoxetine (CYMBALTA) 60 MG capsule Take 1 capsule (60 mg total) by mouth daily. 09/15/18   Mullis, Kiersten P, DO  gabapentin (NEURONTIN) 800 MG tablet Take 1 tablet (800 mg total) by mouth 3 (three) times daily. 09/06/19   Mullis, Kiersten P, DO  glucose blood (ACCU-CHEK GUIDE) test strip USE  TO CHECK BLOOD SUGAR THREE TIMES DAILY 10/13/19   Mullis, Kiersten P, DO  JANUVIA 100 MG tablet TAKE 1 TABLET BY MOUTH EVERY DAY IN THE EVENING Patient taking differently: Take 100 mg by mouth every evening.  03/14/19   Mullis, Kiersten P, DO  ketoconazole (NIZORAL) 2 % shampoo APPLY TO AFFECTED AREA TOPICALLY 2 (TWO) TIMES A WEEK. 11/06/19   Mullis, Kiersten P, DO  Lancets Misc. (ACCU-CHEK FASTCLIX LANCET) KIT USE TO CHECK BLOOD SUGAR THREE TIMES DAILY 09/22/19   Mullis, Kiersten P, DO  loperamide (IMODIUM A-D) 2 MG tablet Take 2 tablets (4 mg total) by mouth 3 (three) times daily as needed for diarrhea or loose stools. 06/08/19   Nuala Alpha, DO  loratadine (CLARITIN) 10 MG tablet Take 10 mg by mouth daily.    [provider]  meclizine (ANTIVERT) 12.5 MG tablet TAKE 1 TABLET 2 TIMES DAILY AS NEEDED FOR DIZZINESS. 08/18/19   Mullis, Kiersten P, DO  metFORMIN (GLUCOPHAGE) 1000 MG tablet Take 1 tablet (1,000 mg total) by mouth 2 (two) times daily with a meal. 09/06/19   Mullis, Kiersten P, DO  methocarbamol (ROBAXIN) 500 MG tablet Take 500 mg by mouth 3 (three) times daily.    [provider]  Multiple Vitamins-Minerals ([employer] SPECTRAVITE WOMENS SENIOR PO) Take 1 tablet by mouth daily.     [provider]  omeprazole (PRILOSEC) 20 MG capsule TAKE 1 CAPSULE BY MOUTH EVERY DAY IN THE EVENING 12/20/19   Mullis, Kiersten P, DO  ondansetron (ZOFRAN) 4 MG tablet Take 1 tablet (4 mg total) by mouth every 8 (eight) hours as needed for nausea or vomiting. 01/30/20   Anderson, Chelsey L, DO  Semaglutide,0.25 or 0.5MG/DOS, (OZEMPIC, 0.25 OR 0.5 MG/DOSE,) 2 MG/1.5ML SOPN Inject 0.5 mg into the skin once a  week. 12/13/19   [name], DO  traMADol (ULTRAM) 50 MG tablet Take by mouth. 06/10/19   [provider]  triamcinolone (KENALOG) 0.025 % cream Apply 1 application topically 3 (three) times daily as needed (rash).     [provider]  Wound Dressings (MEDIHONEY CA ALGINATE 2"X2") PADS APPLY 1 UNITS TOPICALLY ONCE DAILY 10/10/19   [provider]  meloxicam (MOBIC) 15 MG tablet TAKE 1 TABLET BY MOUTH EVERY DAY Patient taking differently: Take 15 mg by mouth daily.  02/26/19   Mullis, Kiersten P, DO  omeprazole (PRILOSEC) 20 MG capsule TAKE 1 CAPSULE BY MOUTH EVERY DAY IN THE EVENING Patient taking differently: Take 20 mg by mouth every evening.  03/03/19   Mullis, Kiersten P, DO     Allergies Hydrocodone-acetaminophen   Family History  Problem Relation Age of Onset  . Healthy Mother   . Heart disease Father   . Alcohol abuse Father   . Alcohol abuse Brother   . Heart disease Sister   . Diabetes Sister   . Breast cancer Daughter   . Colon polyps Neg Hx   . Colon  cancer Neg Hx   . Esophageal cancer Neg Hx   . Rectal cancer Neg Hx   . Stomach cancer Neg Hx     Social History Social History   Tobacco Use  . Smoking status: Never Smoker  . Smokeless tobacco: Never Used  Vaping Use  . Vaping Use: Never used  Substance Use Topics  . Alcohol use: No  . Drug use: No    Review of Systems  Constitutional:   No fever or chills.  ENT:   No sore throat. No rhinorrhea. Cardiovascular:   No chest pain or syncope. Respiratory:   No dyspnea or cough. Gastrointestinal: Positive as above for abdominal pain without vomiting and diarrhea.  Musculoskeletal:   Negative for focal pain or swelling All other systems reviewed and are negative except as documented above in ROS and HPI.  ____________________________________________   PHYSICAL EXAM:  VITAL SIGNS: ED Triage Vitals  Enc Vitals Group     BP 01/30/20 1552 (!) 147/80     Pulse Rate 01/30/20 1552 (!) 104     Resp 01/30/20 1552 18     Temp 01/30/20 1552 98.5 F (36.9 C)     Temp Source 01/30/20 1552 Oral     SpO2 01/30/20 1552 94 %     Weight 01/30/20 1553 237 lb (107.5 kg)     Height 01/30/20 1553 '5\' 5"'  (1.651 m)     Head Circumference --      Peak Flow --      Pain Score 01/30/20 1553 10     Pain Loc --      Pain Edu? --      Excl. in [place]? --     Vital signs reviewed, nursing assessments reviewed.   Constitutional:   Alert and oriented. Non-toxic appearance. Eyes:   Conjunctivae are normal. EOMI. PERRL. ENT      Head:   Normocephalic and atraumatic.      Nose:   Normal.      Mouth/Throat:   Dry mucous membranes      Neck:   No meningismus. Full ROM. Hematological/Lymphatic/Immunilogical:   No cervical lymphadenopathy. Cardiovascular:   RRR. Symmetric bilateral radial and DP pulses.  No murmurs. Cap refill less than 2 seconds. Respiratory:   Normal respiratory effort without tachypnea/retractions. Breath sounds are clear and equal bilaterally. No  wheezes/rales/rhonchi. Gastrointestinal:   Soft with pronounced epigastric and left upper quadrant tenderness and some mild diffuse tenderness.  Mildly distended. There is no CVA tenderness.  No rebound, rigidity, or guarding.  Musculoskeletal:   Normal range of motion in all extremities. No joint effusions.  No lower extremity tenderness.  No edema. Neurologic:   Normal speech and language.  Motor grossly intact. No acute focal neurologic deficits are appreciated.  Skin:    Skin is warm, dry and intact. No rash noted.  No petechiae, purpura, or bullae.  ____________________________________________    LABS (pertinent positives/negatives) (all labs ordered are listed, but only abnormal results are displayed) Labs Reviewed  COMPREHENSIVE METABOLIC PANEL - Abnormal; Notable for the following components:      Result Value   Glucose, Bld 170 (*)    Albumin 3.2 (*)    Total Bilirubin 1.7 (*)    All other components within normal limits  CBC - Abnormal; Notable for the following components:   WBC 16.7 (*)    RDW 18.5 (*)    All other components within normal limits  SARS CORONAVIRUS 2 BY RT PCR (HOSPITAL ORDER, [facility])  LIPASE, BLOOD  URINALYSIS, COMPLETE (UACMP) WITH MICROSCOPIC  HEMOGLOBIN A1C  HIV ANTIBODY (ROUTINE TESTING W REFLEX)  CBC  CREATININE, SERUM  BASIC METABOLIC PANEL  CBC  TROPONIN I (HIGH SENSITIVITY)  TROPONIN I (HIGH SENSITIVITY)   ____________________________________________   EKG  Interpreted by me Sinus tachycardia rate 107.  Normal axis and intervals.  Normal QRS ST segments and T waves.  No ischemic changes  ____________________________________________    RADIOLOGY  CT ABDOMEN PELVIS W CONTRAST  Result Date: 01/30/2020 CLINICAL DATA:  Nausea and vomiting.  Acute abdominal pain. EXAM: CT ABDOMEN AND PELVIS WITH CONTRAST TECHNIQUE: Multidetector CT imaging of the abdomen and pelvis was performed using the standard  protocol following bolus administration of intravenous contrast. CONTRAST:  130m OMNIPAQUE IOHEXOL 300 MG/ML  SOLN COMPARISON:  CT 08/26/2012 FINDINGS: Lower chest: Scattered atelectasis. No pleural fluid. Hepatobiliary: Diffusely decreased hepatic density. Heterogeneous hepatic parenchyma with suggestion of nodular contours about the anterior left lobe. Possible tiny micro nodules. No dominant hepatic lesion. Cholecystectomy without biliary dilatation. Pancreas: No ductal dilatation or inflammation. Spleen: Upper normal in size spanning 13.7 cm AP. No focal abnormality. 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 Adrenals/Urinary Tract: Normal adrenal glands. No hydronephrosis or perinephric edema. Homogeneous renal enhancement with symmetric excretion on delayed phase imaging. Previous cyst in the upper left kidney is not well seen on the current exam. There is a small parapelvic cyst in the right kidney. Urinary bladder is physiologically distended without wall thickening. Stomach/Bowel: Small hiatal hernia. Stomach is unremarkable. Small bowel loops are diffusely dilated and fluid-filled. There is fecalization of small bowel contents both in the mid small bowel as well as distal small bowel including the distal ileum. The terminal ileum is difficult to define there is no definite terminal ileal inflammation. There is no transition point to decompressed small bowel. There is mild mesenteric edema. No pneumatosis or perforation. Mild colonic tortuosity with small volume of colonic stool. Vascular/Lymphatic: Mild aortic atherosclerosis. No aortic aneurysm. Patent portal vein. Mesenteric vessels appear patent. No bulky abdominopelvic adenopathy. Reproductive: Status post hysterectomy. No adnexal masses. Other: Mild mesenteric edema. Trace free fluid in the pelvis. No upper abdominal ascites. No free air. Musculoskeletal: L4 compression fracture with vertebral augmentation. This appears similar in  appearance to lumbar MRI 05/24/2019 allowing for  differences in modality. Remote bilateral anterior rib fractures with peripheral callus. No acute osseous abnormalities are seen. IMPRESSION: 1. Diffusely dilated and fluid-filled small bowel loops with fecalization of small bowel contents. There is no transition point, however there is mild mesenteric edema and trace free fluid in the pelvis. Differential considerations include small bowel ileus versus early obstruction 2. Diffusely decreased hepatic density with suggestion of nodular contours about the anterior left lobe, raising concern for cirrhosis. Possible tiny micro nodules. Recommend correlation with cirrhosis risk factors. Consider further evaluation with hepatic protocol MRI for more detailed hepatic parenchyma characterization. Borderline splenomegaly. 3. Small hiatal hernia. 4. 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 Aortic Atherosclerosis (ICD10-I70.0). Electronically Signed   By: Keith Rake M.D.   On: 01/30/2020 20:07    ____________________________________________   PROCEDURES Procedures  ____________________________________________  DIFFERENTIAL DIAGNOSIS   Pancreatitis, choledocholithiasis, diverticulitis, bowel obstruction, gastritis, peptic ulcer disease  CLINICAL IMPRESSION / ASSESSMENT AND PLAN / ED COURSE  Medications ordered in the ED: Medications  lidocaine (XYLOCAINE) 2 % jelly 1 application (has no administration in time range)  HYDROmorphone (DILAUDID) injection 1 mg (has no administration in time range)  insulin aspart (novoLOG) injection 0-20 Units (has no administration in time range)  enoxaparin (LOVENOX) injection 40 mg (has no administration in time range)  0.9 %  sodium chloride infusion (has no administration in time range)  acetaminophen (TYLENOL) tablet 650 mg (has no administration in time range)    Or  acetaminophen (TYLENOL) suppository 650 mg (has no administration in time  range)  morphine 2 MG/ML injection 2 mg (has no administration in time range)  ondansetron (ZOFRAN) tablet 4 mg (has no administration in time range)    Or  ondansetron (ZOFRAN) injection 4 mg (has no administration in time range)  sodium chloride 0.9 % bolus 1,000 mL (1,000 mLs Intravenous New Bag/Given 01/30/20 2002)  ondansetron (ZOFRAN) injection 4 mg (4 mg Intravenous Given 01/30/20 2004)  ketorolac (TORADOL) 30 MG/ML injection 15 mg (15 mg Intravenous Given 01/30/20 2004)  iohexol (OMNIPAQUE) 300 MG/ML solution 125 mL (125 mLs Intravenous Contrast Given 01/30/20 1943)    Pertinent labs & imaging results that were available during my care of the patient were reviewed by me and considered in my medical decision making (see chart for details).  [name] was evaluated in Emergency Department on 01/30/2020 for the symptoms described in the history of present illness. She was evaluated in the context of the global COVID-19 pandemic, which necessitated consideration that the patient might be at risk for infection with the SARS-CoV-2 virus that causes COVID-19. Institutional protocols and algorithms that pertain to the evaluation of patients at risk for COVID-19 are in a state of rapid change based on information released by regulatory bodies including the CDC and federal and state organizations. These policies and algorithms were followed during the patient's care in the ED.   Patient presents with severe epigastric pain and tenderness.  She is tachycardic due to pain and dehydration, not septic.  Other vital signs are normal.  Abdominal exam is nonsurgical.  Initial labs unremarkable.  Will obtain a CT scan while giving IV fluids for hydration and Toradol and Zofran for pain and nausea relief.  ----------------------------------------- 9:22 PM on 01/30/2020 -----------------------------------------  CT consistent with early small bowel obstruction.  Given the patient's symptoms and findings, I  think that she would only worsen with outpatient management.  Will place NG tube and plan to hospitalize for decompression  and bowel rest, IV hydration and pain management.  Will give Dilaudid 1 mg IV for now, as she is on scheduled oxycodone 10 mg at home for chronic pain.  Patient is agreeable with this plan.  Case has been discussed with the hospitalist.  We will plan to place NG tube prior to patient leaving the emergency department.      ____________________________________________   FINAL CLINICAL IMPRESSION(S) / ED DIAGNOSES    Final diagnoses:  SBO (small bowel obstruction) ([place])  Generalized abdominal pain     ED Discharge Orders    None      Portions of this note were generated with dragon dictation software. Dictation errors may occur despite best attempts at proofreading.   Carrie Mew, MD 01/30/20 2123

## 2020-01-30 NOTE — H&P (Signed)
History and Physical    [name]:[id number] DOB: Mar 30, 1956 DOA: 01/30/2020  PCP: [name], DO   Patient coming from: Home  I have personally briefly reviewed patient's old medical records in [place]  Chief Complaint: epigastric pain  HPI: [name] is a 64 y.o. female with medical history significant for COPD, diabetes, chronic pain, HTN, dementia who presents to the emergency room with a 2-day history of epigastric pain of severe intensity, constant, nonradiating, aggravated by eating and with movement.  Unrelieved with pain medication.  She has nausea but no vomiting.  Reports new constipation and decreased flatus.  ED Course: On arrival she was afebrile, HR 104, BP 147/80.  EKG showed NSR with no acute ST-T wave changes.  Lipase 27, troponin 6, WBC 16,000, total bili 1.7.  CT abdomen and pelvis suggested small bowel ileus versus early obstruction.  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  Hospitalist consulted for admission.  Review of Systems: As per HPI otherwise all other systems on review of systems negative.    Past Medical History:  Diagnosis Date  . Allergy   . Arthritis   . COPD (chronic obstructive pulmonary disease) ([place])   . Diabetes mellitus, type 2 ([place])   . Excessive daytime sleepiness 11/12/2017  . GERD (gastroesophageal reflux disease)   . Goiter   . History of kidney stones   . Hypertension   . Kidney stone   . Memory loss   . Mixed Alzheimer's and vascular dementia ([place]) 08/28/2017  . Osteoporosis    in lower back per pt   . Plantar fasciitis   . Snoring 08/28/2017  . Stroke [facility]) 15 years ago    residual left sided weakness  . Tremors of nervous system   . Uterovaginal prolapse, incomplete 06/10/2010   Qualifier: Diagnosis of  By: Zebedee Iba NP, Manuela Schwartz    . Vertigo     Past Surgical History:  Procedure Laterality Date  . APPENDECTOMY    .  BLADDER SURGERY     x2  . CATARACT EXTRACTION W/PHACO Left 05/06/2017   Procedure: CATARACT EXTRACTION PHACO AND INTRAOCULAR LENS PLACEMENT ([place]) LEFT DIABETIC;  Surgeon: Leandrew Koyanagi, MD;  Location: [place];  Service: Ophthalmology;  Laterality: Left;  Diabetic - oral meds  . CATARACT EXTRACTION W/PHACO Right 06/03/2017   Procedure: CATARACT EXTRACTION PHACO AND INTRAOCULAR LENS PLACEMENT ([place]) RIGHT DIABETIC;  Surgeon: Leandrew Koyanagi, MD;  Location: [place];  Service: Ophthalmology;  Laterality: Right;  Diabetic - oral meds  . CHOLECYSTECTOMY    . COLONOSCOPY  03/14/2016  . FOOT SURGERY    . KYPHOPLASTY N/A 04/07/2019   Procedure: L4 KYPHOPLASTY;  Surgeon: Hessie Knows, MD;  Location: [facility];  Service: Orthopedics;  Laterality: N/A;  . TOTAL ABDOMINAL HYSTERECTOMY    . TOTAL KNEE ARTHROPLASTY Left 09/21/2018   Procedure: TOTAL KNEE ARTHROPLASTY-LEFT NEEDS RNFA;  Surgeon: Hessie Knows, MD;  Location: [facility];  Service: Orthopedics;  Laterality: Left;     reports that she has never smoked. She has never used smokeless tobacco. She reports that she does not drink alcohol and does not use drugs.  Allergies  Allergen Reactions  . Hydrocodone-Acetaminophen Nausea And Vomiting    Family History  Problem Relation Age of Onset  . Healthy Mother   . Heart disease Father   . Alcohol abuse Father   . Alcohol abuse Brother   .  Heart disease Sister   . Diabetes Sister   . Breast cancer Daughter   . Colon polyps Neg Hx   . Colon cancer Neg Hx   . Esophageal cancer Neg Hx   . Rectal cancer Neg Hx   . Stomach cancer Neg Hx       Prior to Admission medications   Medication Sig Start Date End Date Taking? Authorizing Provider  Accu-Chek FastClix Lancets MISC USE TO CHECK BLOOD SUGAR THREE TIMES DAILY 09/22/19   Mullis, Kiersten P, DO  albuterol (PROVENTIL HFA;VENTOLIN HFA) 108 (90 Base) MCG/ACT inhaler Inhale 1-2 puffs into the lungs every 6 (six) hours  as needed for wheezing. 12/25/17   Verner Mould, MD  albuterol (PROVENTIL) (2.5 MG/3ML) 0.083% nebulizer solution Take 3 mLs (2.5 mg total) by nebulization every 4 (four) hours as needed for wheezing. 02/17/19   Mullis, Kiersten P, DO  Alcohol Swabs (B-D SINGLE USE SWABS REGULAR) PADS USE  TO  CLEAN  SKIN  BEFORE CHECKING BLOOD SUGAR 09/21/19   Mullis, Kiersten P, DO  aspirin EC 81 MG tablet Take 81 mg by mouth daily.    [provider]  atorvastatin (LIPITOR) 10 MG tablet Take 1 tablet (10 mg total) by mouth daily. 02/17/19   Mullis, Kiersten P, DO  Blood Glucose Monitoring Suppl (ACCU-CHEK GUIDE) w/Device KIT 1 Device by Does not apply route in the morning, at noon, and at bedtime. 09/28/19   Mullis, Kiersten P, DO  Calcium Carbonate-Vitamin D (CALCIUM-D PO) Take 1 tablet by mouth daily.    [provider]  docusate sodium (COLACE) 250 MG capsule Take 250 mg by mouth daily.    [provider]  DULoxetine (CYMBALTA) 60 MG capsule Take 1 capsule (60 mg total) by mouth daily. 09/15/18   Mullis, Kiersten P, DO  gabapentin (NEURONTIN) 800 MG tablet Take 1 tablet (800 mg total) by mouth 3 (three) times daily. 09/06/19   Mullis, Kiersten P, DO  glucose blood (ACCU-CHEK GUIDE) test strip USE  TO CHECK BLOOD SUGAR THREE TIMES DAILY 10/13/19   Mullis, Kiersten P, DO  JANUVIA 100 MG tablet TAKE 1 TABLET BY MOUTH EVERY DAY IN THE EVENING Patient taking differently: Take 100 mg by mouth every evening.  03/14/19   Mullis, Kiersten P, DO  ketoconazole (NIZORAL) 2 % shampoo APPLY TO AFFECTED AREA TOPICALLY 2 (TWO) TIMES A WEEK. 11/06/19   Mullis, Kiersten P, DO  Lancets Misc. (ACCU-CHEK FASTCLIX LANCET) KIT USE TO CHECK BLOOD SUGAR THREE TIMES DAILY 09/22/19   Mullis, Kiersten P, DO  loperamide (IMODIUM A-D) 2 MG tablet Take 2 tablets (4 mg total) by mouth 3 (three) times daily as needed for diarrhea or loose stools. 06/08/19   Nuala Alpha, DO  loratadine (CLARITIN) 10 MG tablet Take  10 mg by mouth daily.    [provider]  meclizine (ANTIVERT) 12.5 MG tablet TAKE 1 TABLET 2 TIMES DAILY AS NEEDED FOR DIZZINESS. 08/18/19   Mullis, Kiersten P, DO  metFORMIN (GLUCOPHAGE) 1000 MG tablet Take 1 tablet (1,000 mg total) by mouth 2 (two) times daily with a meal. 09/06/19   Mullis, Kiersten P, DO  methocarbamol (ROBAXIN) 500 MG tablet Take 500 mg by mouth 3 (three) times daily.    [provider]  Multiple Vitamins-Minerals ([employer] SPECTRAVITE WOMENS SENIOR PO) Take 1 tablet by mouth daily.     [provider]  omeprazole (PRILOSEC) 20 MG capsule TAKE 1 CAPSULE BY MOUTH EVERY DAY IN THE EVENING 12/20/19  Mullis, Kiersten P, DO  ondansetron (ZOFRAN) 4 MG tablet Take 1 tablet (4 mg total) by mouth every 8 (eight) hours as needed for nausea or vomiting. 01/30/20   Anderson, Chelsey L, DO  Semaglutide,0.25 or 0.5MG/DOS, (OZEMPIC, 0.25 OR 0.5 MG/DOSE,) 2 MG/1.5ML SOPN Inject 0.5 mg into the skin once a week. 12/13/19   Mullis, Kiersten P, DO  traMADol (ULTRAM) 50 MG tablet Take by mouth. 06/10/19   [provider]  triamcinolone (KENALOG) 0.025 % cream Apply 1 application topically 3 (three) times daily as needed (rash).     [provider]  Wound Dressings (MEDIHONEY CA ALGINATE 2"X2") PADS APPLY 1 UNITS TOPICALLY ONCE DAILY 10/10/19   [provider]  meloxicam (MOBIC) 15 MG tablet TAKE 1 TABLET BY MOUTH EVERY DAY Patient taking differently: Take 15 mg by mouth daily.  02/26/19   Mullis, Kiersten P, DO  omeprazole (PRILOSEC) 20 MG capsule TAKE 1 CAPSULE BY MOUTH EVERY DAY IN THE EVENING Patient taking differently: Take 20 mg by mouth every evening.  03/03/19   [name], DO    Physical Exam: Vitals:   01/30/20 1553 01/30/20 1856 01/30/20 1956 01/30/20 2000  BP:  112/64 108/64   Pulse:  99 92   Resp:    20  Temp:      TempSrc:      SpO2:  93% 96%   Weight: 107.5 kg     Height: '5\' 5"'  (1.651 m)        Vitals:   01/30/20 1553  01/30/20 1856 01/30/20 1956 01/30/20 2000  BP:  112/64 108/64   Pulse:  99 92   Resp:    20  Temp:      TempSrc:      SpO2:  93% 96%   Weight: 107.5 kg     Height: '5\' 5"'  (1.651 m)         Constitutional: Alert and oriented x 3 . Not in any apparent distress HEENT:      Head: Normocephalic and atraumatic.         Eyes: PERLA, EOMI, Conjunctivae are normal. Sclera is non-icteric.       Mouth/Throat: Mucous membranes are moist.       Neck: Supple with no signs of meningismus. Cardiovascular: Regular rate and rhythm. No murmurs, gallops, or rubs. 2+ symmetrical distal pulses are present . No JVD. No LE edema Respiratory: Respiratory effort normal .Lungs sounds clear bilaterally. No wheezes, crackles, or rhonchi.  Gastrointestinal:  Upper periumbilical tenderness, distended abdomen but soft, sluggish bowel sounds. Genitourinary: No CVA tenderness. Musculoskeletal: Nontender with normal range of motion in all extremities. No cyanosis, or erythema of extremities. Neurologic: Normal speech and language. Face is symmetric. Moving all extremities. No gross focal neurologic deficits . Skin: Skin is warm, dry.  No rash or ulcers Psychiatric: Mood and affect are normal Speech and behavior are normal   Labs on Admission: I have personally reviewed following labs and imaging studies  CBC: Recent Labs  Lab 01/30/20 1555  WBC 16.7*  HGB 12.5  HCT 38.1  MCV 87.6  PLT 671   Basic Metabolic Panel: Recent Labs  Lab 01/30/20 1555  NA 137  K 3.9  CL 100  CO2 23  GLUCOSE 170*  BUN 12  CREATININE 0.62  CALCIUM 9.3   GFR: Estimated Creatinine Clearance: 87.7 mL/min (by C-G formula based on SCr of 0.62 mg/dL). Liver Function Tests: Recent Labs  Lab 01/30/20 1555  AST 40  ALT 26  ALKPHOS 118  BILITOT 1.7*  PROT 8.1  ALBUMIN 3.2*   Recent Labs  Lab 01/30/20 1555  LIPASE 27   No results for input(s): AMMONIA in the last 168 hours. Coagulation Profile: No results for  input(s): INR, PROTIME in the last 168 hours. Cardiac Enzymes: No results for input(s): CKTOTAL, CKMB, CKMBINDEX, TROPONINI in the last 168 hours. BNP (last 3 results) No results for input(s): PROBNP in the last 8760 hours. HbA1C: No results for input(s): HGBA1C in the last 72 hours. CBG: No results for input(s): GLUCAP in the last 168 hours. Lipid Profile: No results for input(s): CHOL, HDL, LDLCALC, TRIG, CHOLHDL, LDLDIRECT in the last 72 hours. Thyroid Function Tests: No results for input(s): TSH, T4TOTAL, FREET4, T3FREE, THYROIDAB in the last 72 hours. Anemia Panel: No results for input(s): VITAMINB12, FOLATE, FERRITIN, TIBC, IRON, RETICCTPCT in the last 72 hours. Urine analysis:    Component Value Date/Time   COLORURINE YELLOW (A) 09/08/2018 0900   APPEARANCEUR CLEAR (A) 09/08/2018 0900   APPEARANCEUR Hazy 10/21/2014 1320   LABSPEC 1.017 09/08/2018 0900   LABSPEC 1.015 10/21/2014 1320   PHURINE 5.0 09/08/2018 0900   GLUCOSEU NEGATIVE 09/08/2018 0900   GLUCOSEU Negative 10/21/2014 1320   HGBUR NEGATIVE 09/08/2018 0900   HGBUR trace-intact 04/26/2010 1614   BILIRUBINUR NEGATIVE 09/08/2018 0900   BILIRUBINUR Negative 10/21/2014 1320   KETONESUR NEGATIVE 09/08/2018 0900   PROTEINUR NEGATIVE 09/08/2018 0900   UROBILINOGEN 0.2 08/25/2012 2117   NITRITE POSITIVE (A) 09/08/2018 0900   LEUKOCYTESUR NEGATIVE 09/08/2018 0900   LEUKOCYTESUR Negative 10/21/2014 1320    Radiological Exams on Admission: CT ABDOMEN PELVIS W CONTRAST  Result Date: 01/30/2020 CLINICAL DATA:  Nausea and vomiting.  Acute abdominal pain. EXAM: CT ABDOMEN AND PELVIS WITH CONTRAST TECHNIQUE: Multidetector CT imaging of the abdomen and pelvis was performed using the standard protocol following bolus administration of intravenous contrast. CONTRAST:  144m OMNIPAQUE IOHEXOL 300 MG/ML  SOLN COMPARISON:  CT 08/26/2012 FINDINGS: Lower chest: Scattered atelectasis. No pleural fluid. Hepatobiliary: Diffusely  decreased hepatic density. Heterogeneous hepatic parenchyma with suggestion of nodular contours about the anterior left lobe. Possible tiny micro nodules. No dominant hepatic lesion. Cholecystectomy without biliary dilatation. Pancreas: No ductal dilatation or inflammation. Spleen: Upper normal in size spanning 13.7 cm AP. No focal abnormality. 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 Adrenals/Urinary Tract: Normal adrenal glands. No hydronephrosis or perinephric edema. Homogeneous renal enhancement with symmetric excretion on delayed phase imaging. Previous cyst in the upper left kidney is not well seen on the current exam. There is a small parapelvic cyst in the right kidney. Urinary bladder is physiologically distended without wall thickening. Stomach/Bowel: Small hiatal hernia. Stomach is unremarkable. Small bowel loops are diffusely dilated and fluid-filled. There is fecalization of small bowel contents both in the mid small bowel as well as distal small bowel including the distal ileum. The terminal ileum is difficult to define there is no definite terminal ileal inflammation. There is no transition point to decompressed small bowel. There is mild mesenteric edema. No pneumatosis or perforation. Mild colonic tortuosity with small volume of colonic stool. Vascular/Lymphatic: Mild aortic atherosclerosis. No aortic aneurysm. Patent portal vein. Mesenteric vessels appear patent. No bulky abdominopelvic adenopathy. Reproductive: Status post hysterectomy. No adnexal masses. Other: Mild mesenteric edema. Trace free fluid in the pelvis. No upper abdominal ascites. No free air. Musculoskeletal: L4 compression fracture with vertebral augmentation. This appears similar in appearance to lumbar MRI 05/24/2019 allowing for differences in modality. Remote bilateral anterior  rib fractures with peripheral callus. No acute osseous abnormalities are seen. IMPRESSION: 1. Diffusely dilated and fluid-filled small bowel  loops with fecalization of small bowel contents. There is no transition point, however there is mild mesenteric edema and trace free fluid in the pelvis. Differential considerations include small bowel ileus versus early obstruction 2. Diffusely decreased hepatic density with suggestion of nodular contours about the anterior left lobe, raising concern for cirrhosis. Possible tiny micro nodules. Recommend correlation with cirrhosis risk factors. Consider further evaluation with hepatic protocol MRI for more detailed hepatic parenchyma characterization. Borderline splenomegaly. 3. Small hiatal hernia. 4. 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 Aortic Atherosclerosis (ICD10-I70.0). Electronically Signed   By: Keith Rake M.D.   On: 01/30/2020 20:07    EKG: Independently reviewed. Interpretation : Sinus rhythm with no acute ST-T wave changes Assessment/Plan 64 year old female with COPD, diabetes, chronic pain, HTN, dementia who presents to the emergency room with a 2-day history of severe epigastric pain.  CT abdomen consistent with ileus versus early small bowel obstruction    Partial small bowel obstruction (HCC) -Keep n.p.o. -IV hydration, IV antiemetics and IV pain medication -Continue NG tube  -Surgical consult requested    Chronic pain syndrome -IV pain meds for now    Diabetes mellitus type 2, uncomplicated (HCC) -Insulin sliding scale coverage    COPD with chronic bronchitis ([place]) -Not acutely exacerbated.  DuoNebs as needed.    DVT prophylaxis: Lovenox  Code Status: full code  Family Communication:  none allow was here Disposition Plan: Back to previous home environment Consults called: Surgery Status: Observation    Athena Masse MD [facility]     01/30/2020, 9:04 PM

## 2020-01-30 NOTE — Progress Notes (Signed)
Sent in 30 tablets zofran without refills. Would recommend following up with PCP for further refills.

## 2020-01-30 NOTE — ED Triage Notes (Signed)
Pt in via [facility] from home with complaints of severe epigastric pain x 2 days w/ some nausea, denies emesis/diarrhea.  Pt diaphoretic in triage, vitals WDL.

## 2020-01-30 NOTE — ED Notes (Signed)
Transport has been called.  

## 2020-01-30 NOTE — ED Triage Notes (Addendum)
First Nurse NOte:  Arrives [facility] for c/o epigastric pain.  Onset today.  VS wnl.  2 NTG given.  324 mg ASA, and 4 Zofran given. 18 ga AL LFA

## 2020-01-30 NOTE — ED Notes (Signed)
Pt transported to CT ?

## 2020-01-31 ENCOUNTER — Encounter: Payer: Self-pay | Admitting: Internal Medicine

## 2020-01-31 ENCOUNTER — Observation Stay: Payer: Medicare Other

## 2020-01-31 DIAGNOSIS — K5651 Intestinal adhesions [bands], with partial obstruction: Secondary | ICD-10-CM | POA: Diagnosis present

## 2020-01-31 DIAGNOSIS — K56609 Unspecified intestinal obstruction, unspecified as to partial versus complete obstruction: Secondary | ICD-10-CM | POA: Diagnosis present

## 2020-01-31 DIAGNOSIS — Z9049 Acquired absence of other specified parts of digestive tract: Secondary | ICD-10-CM | POA: Diagnosis not present

## 2020-01-31 DIAGNOSIS — G894 Chronic pain syndrome: Secondary | ICD-10-CM | POA: Diagnosis present

## 2020-01-31 DIAGNOSIS — Z87442 Personal history of urinary calculi: Secondary | ICD-10-CM | POA: Diagnosis not present

## 2020-01-31 DIAGNOSIS — F0280 Dementia in other diseases classified elsewhere without behavioral disturbance: Secondary | ICD-10-CM | POA: Diagnosis present

## 2020-01-31 DIAGNOSIS — J449 Chronic obstructive pulmonary disease, unspecified: Secondary | ICD-10-CM | POA: Diagnosis present

## 2020-01-31 DIAGNOSIS — E119 Type 2 diabetes mellitus without complications: Secondary | ICD-10-CM | POA: Diagnosis present

## 2020-01-31 DIAGNOSIS — I10 Essential (primary) hypertension: Secondary | ICD-10-CM | POA: Diagnosis present

## 2020-01-31 DIAGNOSIS — Z791 Long term (current) use of non-steroidal anti-inflammatories (NSAID): Secondary | ICD-10-CM | POA: Diagnosis not present

## 2020-01-31 DIAGNOSIS — K56600 Partial intestinal obstruction, unspecified as to cause: Secondary | ICD-10-CM | POA: Diagnosis not present

## 2020-01-31 DIAGNOSIS — Z7982 Long term (current) use of aspirin: Secondary | ICD-10-CM | POA: Diagnosis not present

## 2020-01-31 DIAGNOSIS — Z833 Family history of diabetes mellitus: Secondary | ICD-10-CM | POA: Diagnosis not present

## 2020-01-31 DIAGNOSIS — Z20822 Contact with and (suspected) exposure to covid-19: Secondary | ICD-10-CM | POA: Diagnosis present

## 2020-01-31 DIAGNOSIS — Z79899 Other long term (current) drug therapy: Secondary | ICD-10-CM | POA: Diagnosis not present

## 2020-01-31 DIAGNOSIS — Z96652 Presence of left artificial knee joint: Secondary | ICD-10-CM | POA: Diagnosis present

## 2020-01-31 DIAGNOSIS — K219 Gastro-esophageal reflux disease without esophagitis: Secondary | ICD-10-CM | POA: Diagnosis present

## 2020-01-31 DIAGNOSIS — Z9841 Cataract extraction status, right eye: Secondary | ICD-10-CM | POA: Diagnosis not present

## 2020-01-31 DIAGNOSIS — Z8249 Family history of ischemic heart disease and other diseases of the circulatory system: Secondary | ICD-10-CM | POA: Diagnosis not present

## 2020-01-31 DIAGNOSIS — Z9842 Cataract extraction status, left eye: Secondary | ICD-10-CM | POA: Diagnosis not present

## 2020-01-31 DIAGNOSIS — Z961 Presence of intraocular lens: Secondary | ICD-10-CM | POA: Diagnosis present

## 2020-01-31 DIAGNOSIS — Z803 Family history of malignant neoplasm of breast: Secondary | ICD-10-CM | POA: Diagnosis not present

## 2020-01-31 DIAGNOSIS — Z7984 Long term (current) use of oral hypoglycemic drugs: Secondary | ICD-10-CM | POA: Diagnosis not present

## 2020-01-31 DIAGNOSIS — Z90710 Acquired absence of both cervix and uterus: Secondary | ICD-10-CM | POA: Diagnosis not present

## 2020-01-31 DIAGNOSIS — G309 Alzheimer's disease, unspecified: Secondary | ICD-10-CM | POA: Diagnosis present

## 2020-01-31 DIAGNOSIS — Z811 Family history of alcohol abuse and dependence: Secondary | ICD-10-CM | POA: Diagnosis not present

## 2020-01-31 LAB — CBC
HCT: 33.9 % — ABNORMAL LOW (ref 36.0–46.0)
Hemoglobin: 10.8 g/dL — ABNORMAL LOW (ref 12.0–15.0)
MCH: 28.6 pg (ref 26.0–34.0)
MCHC: 31.9 g/dL (ref 30.0–36.0)
MCV: 89.9 fL (ref 80.0–100.0)
Platelets: 160 10*3/uL (ref 150–400)
RBC: 3.77 MIL/uL — ABNORMAL LOW (ref 3.87–5.11)
RDW: 18.6 % — ABNORMAL HIGH (ref 11.5–15.5)
WBC: 10.8 10*3/uL — ABNORMAL HIGH (ref 4.0–10.5)
nRBC: 0 % (ref 0.0–0.2)

## 2020-01-31 LAB — HEMOGLOBIN A1C
Hgb A1c MFr Bld: 7.2 % — ABNORMAL HIGH (ref 4.8–5.6)
Mean Plasma Glucose: 160 mg/dL

## 2020-01-31 LAB — HIV ANTIBODY (ROUTINE TESTING W REFLEX): HIV Screen 4th Generation wRfx: NONREACTIVE

## 2020-01-31 LAB — GLUCOSE, CAPILLARY
Glucose-Capillary: 102 mg/dL — ABNORMAL HIGH (ref 70–99)
Glucose-Capillary: 104 mg/dL — ABNORMAL HIGH (ref 70–99)
Glucose-Capillary: 108 mg/dL — ABNORMAL HIGH (ref 70–99)
Glucose-Capillary: 115 mg/dL — ABNORMAL HIGH (ref 70–99)
Glucose-Capillary: 120 mg/dL — ABNORMAL HIGH (ref 70–99)
Glucose-Capillary: 98 mg/dL (ref 70–99)

## 2020-01-31 LAB — BASIC METABOLIC PANEL
Anion gap: 9 (ref 5–15)
BUN: 17 mg/dL (ref 8–23)
CO2: 27 mmol/L (ref 22–32)
Calcium: 8.8 mg/dL — ABNORMAL LOW (ref 8.9–10.3)
Chloride: 104 mmol/L (ref 98–111)
Creatinine, Ser: 0.64 mg/dL (ref 0.44–1.00)
GFR calc Af Amer: >60 mL/min (ref >60)
GFR calc non Af Amer: >60 mL/min (ref >60)
Glucose, Bld: 114 mg/dL — ABNORMAL HIGH (ref 70–99)
Potassium: 4.1 mmol/L (ref 3.5–5.1)
Sodium: 140 mmol/L (ref 135–145)

## 2020-01-31 LAB — SARS CORONAVIRUS 2 BY RT PCR (HOSPITAL ORDER, PERFORMED IN ~~LOC~~ HOSPITAL LAB): SARS Coronavirus 2: NEGATIVE

## 2020-01-31 MED ORDER — ALBUTEROL SULFATE (2.5 MG/3ML) 0.083% IN NEBU: 3.0000 mL | INHALATION_SOLUTION | Freq: Four times a day (QID) | RESPIRATORY_TRACT | Status: DC | PRN

## 2020-01-31 MED ORDER — MENTHOL 3 MG MT LOZG
1.0000 | LOZENGE | OROMUCOSAL | Status: DC | PRN
  Administered 2020-01-31: 3 mg via ORAL
  Filled 2020-01-31 (×2): qty 9

## 2020-01-31 MED ORDER — DEXTROSE IN LACTATED RINGERS 5 % IV SOLN: INTRAVENOUS | Status: DC

## 2020-01-31 MED ORDER — ALBUTEROL SULFATE (2.5 MG/3ML) 0.083% IN NEBU: 2.5000 mg | INHALATION_SOLUTION | RESPIRATORY_TRACT | Status: DC | PRN

## 2020-01-31 NOTE — Consult Note (Signed)
[facility] SURGICAL CONSULTATION NOTE (initial) - cpt: 24268   HISTORY OF PRESENT ILLNESS (HPI):  63 y.o. female presented to [facility] ED yesterday evening (07/12) for evaluation of abdominal pain. Patient reports around a 2-day history of upper and epigastric abdominal pain. She described this pain as a constant cramping ache. This has been constant since the onset, does not radiate anywhere, and seems to be exacerbated with PO intake. She endorses associated nausea and decreased appetite. Denied any fever, chills, cough, CP, SOB, emesis, or urinary changes. She is reporting decreased bowel function. Denied any history of similar. Previous abdominal surgeries positive for appendectomy, cholecystectomy, and hysterectomy. Work up in the ED was concerning for possible ileus vs partial SBO.   Surgery is consulted by hospitalist physician Dr. Judd Gaudier, MD in this context for evaluation and management of small bowel obstruction.   PAST MEDICAL HISTORY (PMH):  Past Medical History:  Diagnosis Date  . Allergy   . Arthritis   . COPD (chronic obstructive pulmonary disease) ([place])   . Diabetes mellitus, type 2 ([place])   . Excessive daytime sleepiness 11/12/2017  . GERD (gastroesophageal reflux disease)   . Goiter   . History of kidney stones   . Hypertension   . Kidney stone   . Memory loss   . Mixed Alzheimer's and vascular dementia ([place]) 08/28/2017  . Osteoporosis    in lower back per pt   . Plantar fasciitis   . Snoring 08/28/2017  . Stroke [facility]) 15 years ago    residual left sided weakness  . Tremors of nervous system   . Uterovaginal prolapse, incomplete 06/10/2010   Qualifier: Diagnosis of  By: Zebedee Iba NP, Manuela Schwartz    . Vertigo      PAST SURGICAL HISTORY ([place]):  Past Surgical History:  Procedure Laterality Date  . APPENDECTOMY    . BLADDER SURGERY     x2  . CATARACT EXTRACTION W/PHACO Left 05/06/2017   Procedure: CATARACT EXTRACTION PHACO AND INTRAOCULAR LENS PLACEMENT  ([place]) LEFT DIABETIC;  Surgeon: Leandrew Koyanagi, MD;  Location: [place];  Service: Ophthalmology;  Laterality: Left;  Diabetic - oral meds  . CATARACT EXTRACTION W/PHACO Right 06/03/2017   Procedure: CATARACT EXTRACTION PHACO AND INTRAOCULAR LENS PLACEMENT ([place]) RIGHT DIABETIC;  Surgeon: Leandrew Koyanagi, MD;  Location: [place];  Service: Ophthalmology;  Laterality: Right;  Diabetic - oral meds  . CHOLECYSTECTOMY    . COLONOSCOPY  03/14/2016  . FOOT SURGERY    . KYPHOPLASTY N/A 04/07/2019   Procedure: L4 KYPHOPLASTY;  Surgeon: Hessie Knows, MD;  Location: [facility];  Service: Orthopedics;  Laterality: N/A;  . TOTAL ABDOMINAL HYSTERECTOMY    . TOTAL KNEE ARTHROPLASTY Left 09/21/2018   Procedure: TOTAL KNEE ARTHROPLASTY-LEFT NEEDS RNFA;  Surgeon: Hessie Knows, MD;  Location: [facility];  Service: Orthopedics;  Laterality: Left;     MEDICATIONS:  Prior to Admission medications   Medication Sig Start Date End Date Taking? Authorizing Provider  Accu-Chek FastClix Lancets MISC USE TO CHECK BLOOD SUGAR THREE TIMES DAILY 09/22/19   Mullis, Kiersten P, DO  albuterol (PROVENTIL HFA;VENTOLIN HFA) 108 (90 Base) MCG/ACT inhaler Inhale 1-2 puffs into the lungs every 6 (six) hours as needed for wheezing. 12/25/17   Verner Mould, MD  albuterol (PROVENTIL) (2.5 MG/3ML) 0.083% nebulizer solution Take 3 mLs (2.5 mg total) by nebulization every 4 (four) hours as needed for wheezing. 02/17/19   Mullis, Kiersten P, DO  Alcohol Swabs (B-D SINGLE USE SWABS REGULAR) PADS USE  TO  CLEAN  SKIN  BEFORE CHECKING BLOOD SUGAR 09/21/19   Mullis, Kiersten P, DO  aspirin EC 81 MG tablet Take 81 mg by mouth daily.    [provider]  atorvastatin (LIPITOR) 10 MG tablet Take 1 tablet (10 mg total) by mouth daily. 02/17/19   Mullis, Kiersten P, DO  Blood Glucose Monitoring Suppl (ACCU-CHEK GUIDE) w/Device KIT 1 Device by Does not apply route in the morning, at noon, and at bedtime. 09/28/19    Mullis, Kiersten P, DO  Calcium Carbonate-Vitamin D (CALCIUM-D PO) Take 1 tablet by mouth daily.    [provider]  docusate sodium (COLACE) 250 MG capsule Take 250 mg by mouth daily.    [provider]  DULoxetine (CYMBALTA) 60 MG capsule Take 1 capsule (60 mg total) by mouth daily. 09/15/18   Mullis, Kiersten P, DO  gabapentin (NEURONTIN) 800 MG tablet Take 1 tablet (800 mg total) by mouth 3 (three) times daily. 09/06/19   Mullis, Kiersten P, DO  glucose blood (ACCU-CHEK GUIDE) test strip USE  TO CHECK BLOOD SUGAR THREE TIMES DAILY 10/13/19   Mullis, Kiersten P, DO  JANUVIA 100 MG tablet TAKE 1 TABLET BY MOUTH EVERY DAY IN THE EVENING Patient taking differently: Take 100 mg by mouth every evening.  03/14/19   Mullis, Kiersten P, DO  ketoconazole (NIZORAL) 2 % shampoo APPLY TO AFFECTED AREA TOPICALLY 2 (TWO) TIMES A WEEK. 11/06/19   Mullis, Kiersten P, DO  Lancets Misc. (ACCU-CHEK FASTCLIX LANCET) KIT USE TO CHECK BLOOD SUGAR THREE TIMES DAILY 09/22/19   Mullis, Kiersten P, DO  loperamide (IMODIUM A-D) 2 MG tablet Take 2 tablets (4 mg total) by mouth 3 (three) times daily as needed for diarrhea or loose stools. 06/08/19   Nuala Alpha, DO  loratadine (CLARITIN) 10 MG tablet Take 10 mg by mouth daily.    [provider]  meclizine (ANTIVERT) 12.5 MG tablet TAKE 1 TABLET 2 TIMES DAILY AS NEEDED FOR DIZZINESS. 08/18/19   Mullis, Kiersten P, DO  metFORMIN (GLUCOPHAGE) 1000 MG tablet Take 1 tablet (1,000 mg total) by mouth 2 (two) times daily with a meal. 09/06/19   Mullis, Kiersten P, DO  methocarbamol (ROBAXIN) 500 MG tablet Take 500 mg by mouth 3 (three) times daily.    [provider]  Multiple Vitamins-Minerals ([employer] SPECTRAVITE WOMENS SENIOR PO) Take 1 tablet by mouth daily.     [provider]  omeprazole (PRILOSEC) 20 MG capsule TAKE 1 CAPSULE BY MOUTH EVERY DAY IN THE EVENING 12/20/19   Mullis, Kiersten P, DO  ondansetron (ZOFRAN) 4 MG tablet Take 1  tablet (4 mg total) by mouth every 8 (eight) hours as needed for nausea or vomiting. 01/30/20   Anderson, Chelsey L, DO  Semaglutide,0.25 or 0.5MG/DOS, (OZEMPIC, 0.25 OR 0.5 MG/DOSE,) 2 MG/1.5ML SOPN Inject 0.5 mg into the skin once a week. 12/13/19   Mullis, Kiersten P, DO  traMADol (ULTRAM) 50 MG tablet Take by mouth. 06/10/19   [provider]  triamcinolone (KENALOG) 0.025 % cream Apply 1 application topically 3 (three) times daily as needed (rash).     [provider]  Wound Dressings (MEDIHONEY CA ALGINATE 2"X2") PADS APPLY 1 UNITS TOPICALLY ONCE DAILY 10/10/19   [provider]  meloxicam (MOBIC) 15 MG tablet TAKE 1 TABLET BY MOUTH EVERY DAY Patient taking differently: Take 15 mg by mouth daily.  02/26/19   Mullis, Kiersten P, DO  omeprazole (PRILOSEC) 20 MG capsule TAKE 1 CAPSULE BY MOUTH  EVERY DAY IN THE EVENING Patient taking differently: Take 20 mg by mouth every evening.  03/03/19   Mullis, Kiersten P, DO     ALLERGIES:  Allergies  Allergen Reactions  . Hydrocodone-Acetaminophen Nausea And Vomiting     SOCIAL HISTORY:  Social History   Socioeconomic History  . Marital status: Single    Spouse name: Not on file  . Number of children: 4  . Years of education: HS  . Highest education level: Not on file  Occupational History  . Occupation: Unemployed  Tobacco Use  . Smoking status: Never Smoker  . Smokeless tobacco: Never Used  Vaping Use  . Vaping Use: Never used  Substance and Sexual Activity  . Alcohol use: No  . Drug use: No  . Sexual activity: Not on file  Other Topics Concern  . Not on file  Social History Narrative   Lives at home alone.   Right-handed.   Rarely uses caffeine.   Social Determinants of Health   Financial Resource Strain:   . Difficulty of Paying Living Expenses:   Food Insecurity:   . Worried About Charity fundraiser in the Last Year:   . Arboriculturist in the Last Year:   Transportation Needs:   . Lexicographer (Medical):   Marland Kitchen Lack of Transportation (Non-Medical):   Physical Activity:   . Days of Exercise per Week:   . Minutes of Exercise per Session:   Stress:   . Feeling of Stress :   Social Connections:   . Frequency of Communication with Friends and Family:   . Frequency of Social Gatherings with Friends and Family:   . Attends Religious Services:   . Active Member of Clubs or Organizations:   . Attends Archivist Meetings:   Marland Kitchen Marital Status:   Intimate Partner Violence:   . Fear of Current or Ex-Partner:   . Emotionally Abused:   Marland Kitchen Physically Abused:   . Sexually Abused:      FAMILY HISTORY:  Family History  Problem Relation Age of Onset  . Healthy Mother   . Heart disease Father   . Alcohol abuse Father   . Alcohol abuse Brother   . Heart disease Sister   . Diabetes Sister   . Breast cancer Daughter   . Colon polyps Neg Hx   . Colon cancer Neg Hx   . Esophageal cancer Neg Hx   . Rectal cancer Neg Hx   . Stomach cancer Neg Hx       REVIEW OF SYSTEMS:  Review of Systems  Constitutional: Negative for chills and fever.  HENT: Negative for congestion and sore throat.   Respiratory: Negative for cough and shortness of breath.   Cardiovascular: Negative for chest pain and palpitations.  Gastrointestinal: Positive for abdominal pain, diarrhea and nausea. Negative for blood in stool, constipation and vomiting.  Genitourinary: Negative for dysuria and urgency.  All other systems reviewed and are negative.   VITAL SIGNS:  Temp:  [97.8 F (36.6 C)-98.5 F (36.9 C)] 97.8 F (36.6 C) (07/13 0513) Pulse Rate:  [80-104] 84 (07/13 0513) Resp:  [16-20] 20 (07/13 0513) BP: (102-147)/(54-80) 102/54 (07/13 0513) SpO2:  [93 %-97 %] 96 % (07/13 0513) Weight:  [107.5 kg] 107.5 kg (07/12 1553)     Height: '5\' 5"'  (165.1 cm) Weight: 107.5 kg BMI (Calculated): 39.44   INTAKE/OUTPUT:  07/12 0701 - 07/13 0700 In: 1000 [IV Piggyback:1000] Out: -   PHYSICAL  EXAM:  Physical Exam Vitals and nursing note reviewed.  Constitutional:      General: She is not in acute distress.    Appearance: She is well-developed. She is obese. She is not ill-appearing.  HENT:     Head: Normocephalic and atraumatic.     Comments: NGT in place Eyes:     General: No scleral icterus.    Extraocular Movements: Extraocular movements intact.  Cardiovascular:     Rate and Rhythm: Normal rate and regular rhythm.     Heart sounds: Normal heart sounds. No murmur heard.   Pulmonary:     Effort: Pulmonary effort is normal. No respiratory distress.     Breath sounds: Normal breath sounds.  Abdominal:     General: Abdomen is protuberant. A surgical scar is present. There is no distension.     Palpations: Abdomen is soft.     Tenderness: There is abdominal tenderness (Mild) in the periumbilical area and left upper quadrant. There is no guarding or rebound.  Genitourinary:    Comments: Deferred Skin:    General: Skin is warm and dry.     Coloration: Skin is not jaundiced or pale.  Neurological:     General: No focal deficit present.     Mental Status: She is alert and oriented to person, place, and time.  Psychiatric:        Mood and Affect: Mood normal.      Labs:  CBC Latest Ref Rng & Units 01/31/2020 01/30/2020 06/08/2019  WBC 4.0 - 10.5 K/uL 10.8(H) 16.7(H) 8.6  Hemoglobin 12.0 - 15.0 g/dL 10.8(L) 12.5 11.6  Hematocrit 36 - 46 % 33.9(L) 38.1 34.9  Platelets 150 - 400 K/uL 160 207 173   CMP Latest Ref Rng & Units 01/31/2020 01/30/2020 12/21/2019  Glucose 70 - 99 mg/dL 114(H) 170(H) 224(H)  BUN 8 - 23 mg/dL '17 12 11  ' Creatinine 0.44 - 1.00 mg/dL 0.64 0.62 0.55(L)  Sodium 135 - 145 mmol/L 140 137 138  Potassium 3.5 - 5.1 mmol/L 4.1 3.9 4.1  Chloride 98 - 111 mmol/L 104 100 100  CO2 22 - 32 mmol/L '27 23 22  ' Calcium 8.9 - 10.3 mg/dL 8.8(L) 9.3 10.0  Total Protein 6.5 - 8.1 g/dL - 8.1 -  Total Bilirubin 0.3 - 1.2 mg/dL - 1.7(H) -  Alkaline Phos 38 - 126 U/L -  118 -  AST 15 - 41 U/L - 40 -  ALT 0 - 44 U/L - 26 -     Imaging studies:   CT Abdomen/Pelvis (01/30/2020) personally reviewed which shows multiple loops of distended small bowel without obvious transition point, and radiologist report reviewed:  IMPRESSION: 1. Diffusely dilated and fluid-filled small bowel loops with fecalization of small bowel contents. There is no transition point, however there is mild mesenteric edema and trace free fluid in the pelvis. Differential considerations include small bowel ileus versus early obstruction 2. Diffusely decreased hepatic density with suggestion of nodular contours about the anterior left lobe, raising concern for cirrhosis. Possible tiny micro nodules. Recommend correlation with cirrhosis risk factors. Consider further evaluation with hepatic protocol MRI for more detailed hepatic parenchyma characterization. Borderline splenomegaly. 3. Small hiatal hernia. 4. 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  KUB (01/31/2020) personally personally reviewed with central dilated loops of small bowel, and radiologist report reviewed; IMPRESSION: NG tube tip in the proximal stomach.   Assessment/Plan: (ICD-10's: K97.609) 64 y.o. female with abdominal pain and nausea concerning for likely partial SBO  secondary to post-surgical adhesive disease.   - Appreciate medicine admission   - Continue NGT decompression; monitor and record output  - NPO + IVF Resuscitation     - Monitor abdominal examination +/- serial KUBs; On-going bowel function  - Pain control prn; antiemetics prn  - No need for emergent surgical intervention currently; continue conservative measures  - Further management per primary service; we will follow    All of the above findings and recommendations were discussed with the patient, and all of patient's questions were answered to her expressed satisfaction.  Thank you for the opportunity to participate in  this patient's care.   -- Edison Simon, PA-C [facility] 01/31/2020, 7:14 AM [phone] M-F: 7am - 4pm

## 2020-01-31 NOTE — Progress Notes (Addendum)
Progress Note    [name]  BOF:[id number] DOB: 1955-12-13  DOA: 01/30/2020 PCP: [name], DO      Brief Narrative:    Medical records reviewed and are as summarized below:  [name] is a 64 y.o. female with medical history significant for COPD, diabetes, chronic pain, HTN, dementia who presented to the emergency room with a 2-day history of epigastric pain and nausea.  She did not have any vomiting.  She was found to have partial small bowel obstruction.  She was evaluated by the general surgeon and conservative management was recommended with n.p.o. status, IV fluids, analgesics, antiemetics and gastric decompression with NG tube.    Assessment/Plan:   Principal Problem:   Partial small bowel obstruction (HCC) Active Problems:   Chronic pain syndrome   Diabetes mellitus type 2, uncomplicated (HCC)   COPD with chronic bronchitis (HCC)   SBO (small bowel obstruction) (HCC)   Partial small bowel obstruction: Keep NPO and gastric decompression with NG tube.  Continue analgesics as needed for pain, antiemetics as needed for nausea/vomiting and IV fluids for hydration.  Follow-up with general surgeon for further recommendations.  COPD with chronic bronchitis: Compensated.  Bronchodilators as needed  Type 2 diabetes mellitus: NovoLog as needed for hyperglycemia.  Questionable liver cirrhosis, no splenic artery aneurysm noted on CT abdomen and pelvis: This was discussed with the patient.  Outpatient follow-up with PCP/gastroenterologist was recommended.  History of stroke: Aspirin and statins on hold   Body mass index is 39.44 kg/m.  (Morbid obesity): This complicates overall care and prognosis.  Diet Order            Diet NPO time specified Except for: Ice Chips  Diet effective now                       Medications:   . enoxaparin (LOVENOX) injection  40 mg Subcutaneous BID  . insulin aspart  0-20 Units Subcutaneous Q4H   Continuous  Infusions: . dextrose 5% lactated ringers       Anti-infectives (From admission, onward)   None             Family Communication/Anticipated D/C date and plan/Code Status   DVT prophylaxis: enoxaparin (LOVENOX) injection 40 mg Start: 01/30/20 2200     Code Status: Full Code  Family Communication: Plan discussed with patient Disposition Plan:    Status is: Inpatient  Remains inpatient appropriate because:IV treatments appropriate due to intensity of illness or inability to take PO and Inpatient level of care appropriate due to severity of illness   Dispo: The patient is from: Home              Anticipated d/c is to: Home              Anticipated d/c date is: 3 days              Patient currently is not medically stable to d/c.               Subjective:   C/o abdominal pain which she grades as 4/10 in severity.  She said she had 1 episode of loose stools last night.  No vomiting.  Objective:    Vitals:   01/30/20 2321 01/31/20 0513 01/31/20 0826 01/31/20 1156  BP:  (!) 102/54 137/68 120/66  Pulse:  84 96 88  Resp:  20    Temp: 98.1 F (36.7 C) 97.8 F (  36.6 C) (!) 97.5 F (36.4 C) 98.1 F (36.7 C)  TempSrc: Oral Oral Oral Oral  SpO2:  96% 97% 96%  Weight:      Height:       No data found.   Intake/Output Summary (Last 24 hours) at 01/31/2020 1436 Last data filed at 01/31/2020 1191 Gross per 24 hour  Intake 1000 ml  Output 100 ml  Net 900 ml   Filed Weights   01/30/20 1553  Weight: 107.5 kg    Exam:  GEN: NAD SKIN: No rash EYES: EOMI ENT: MMM, NG tube to intermiitent low wall suction CV: RRR PULM: CTA B ABD: soft, obese, tenderness in the epigastric area, no rebound tenderness or guarding, +BS CNS: AAO x 3, non focal EXT: No edema or tenderness   Data Reviewed:   I have personally reviewed following labs and imaging studies:  Labs: Labs show the following:   Basic Metabolic Panel: Recent Labs  Lab 01/30/20 1555  01/31/20 0446  NA 137 140  K 3.9 4.1  CL 100 104  CO2 23 27  GLUCOSE 170* 114*  BUN 12 17  CREATININE 0.62 0.64  CALCIUM 9.3 8.8*   GFR Estimated Creatinine Clearance: 87.7 mL/min (by C-G formula based on SCr of 0.64 mg/dL). Liver Function Tests: Recent Labs  Lab 01/30/20 1555  AST 40  ALT 26  ALKPHOS 118  BILITOT 1.7*  PROT 8.1  ALBUMIN 3.2*   Recent Labs  Lab 01/30/20 1555  LIPASE 27   No results for input(s): AMMONIA in the last 168 hours. Coagulation profile No results for input(s): INR, PROTIME in the last 168 hours.  CBC: Recent Labs  Lab 01/30/20 1555 01/31/20 0446  WBC 16.7* 10.8*  HGB 12.5 10.8*  HCT 38.1 33.9*  MCV 87.6 89.9  PLT 207 160   Cardiac Enzymes: No results for input(s): CKTOTAL, CKMB, CKMBINDEX, TROPONINI in the last 168 hours. BNP (last 3 results) No results for input(s): PROBNP in the last 8760 hours. CBG: Recent Labs  Lab 01/30/20 2143 01/30/20 2343 01/31/20 0517 01/31/20 0823 01/31/20 1154  GLUCAP 112* 112* 104* 108* 98   D-Dimer: No results for input(s): DDIMER in the last 72 hours. Hgb A1c: No results for input(s): HGBA1C in the last 72 hours. Lipid Profile: No results for input(s): CHOL, HDL, LDLCALC, TRIG, CHOLHDL, LDLDIRECT in the last 72 hours. Thyroid function studies: No results for input(s): TSH, T4TOTAL, T3FREE, THYROIDAB in the last 72 hours.  Invalid input(s): FREET3 Anemia work up: No results for input(s): VITAMINB12, FOLATE, FERRITIN, TIBC, IRON, RETICCTPCT in the last 72 hours. Sepsis Labs: Recent Labs  Lab 01/30/20 1555 01/31/20 0446  WBC 16.7* 10.8*    Microbiology Recent Results (from the past 240 hour(s))  SARS Coronavirus 2 by RT PCR (hospital order, performed in [facility] hospital lab) Nasopharyngeal Nasopharyngeal Swab     Status: None   Collection Time: 01/30/20  9:33 PM   Specimen: Nasopharyngeal Swab  Result Value Ref Range Status   SARS Coronavirus 2 NEGATIVE NEGATIVE Final     Comment: (NOTE) SARS-CoV-2 target nucleic acids are NOT DETECTED.  The SARS-CoV-2 RNA is generally detectable in upper and lower respiratory specimens during the acute phase of infection. The lowest concentration of SARS-CoV-2 viral copies this assay can detect is 250 copies / mL. A negative result does not preclude SARS-CoV-2 infection and should not be used as the sole basis for treatment or other patient management decisions.  A negative result may occur with  improper specimen collection / handling, submission of specimen other than nasopharyngeal swab, presence of viral mutation(s) within the areas targeted by this assay, and inadequate number of viral copies (<250 copies / mL). A negative result must be combined with clinical observations, patient history, and epidemiological information.  Fact Sheet for Patients:   StrictlyIdeas.no  Fact Sheet for Healthcare Providers: BankingDealers.co.za  This test is not yet approved or  cleared by the [place] FDA and has been authorized for detection and/or diagnosis of SARS-CoV-2 by FDA under an Emergency Use Authorization (EUA).  This EUA will remain in effect (meaning this test can be used) for the duration of the COVID-19 declaration under Section 564(b)(1) of the Act, 21 U.S.C. section 360bbb-3(b)(1), unless the authorization is terminated or revoked sooner.  Performed at [facility], [address] [place], [place] [address]     Procedures and diagnostic studies:  DG Abdomen 1 View  Result Date: 01/30/2020 CLINICAL DATA:  64 year old female NG tube placement. EXAM: ABDOMEN - 1 VIEW COMPARISON:  CT Abdomen and Pelvis earlier today. FINDINGS: AP upright view at 2229 hours. Enteric tube placed with tip in the stomach. Side hole is at the level of the gastric fundus. Excreted IV contrast in nondilated renal collecting systems. Stable cholecystectomy clips. Paucity of  bowel gas in the upper abdomen. Negative lung bases. IMPRESSION: Enteric tube placed, side hole at the level of the gastric fundus. Electronically Signed   By: Genevie Ann M.D.   On: 01/30/2020 22:39   CT ABDOMEN PELVIS W CONTRAST  Result Date: 01/30/2020 CLINICAL DATA:  Nausea and vomiting.  Acute abdominal pain. EXAM: CT ABDOMEN AND PELVIS WITH CONTRAST TECHNIQUE: Multidetector CT imaging of the abdomen and pelvis was performed using the standard protocol following bolus administration of intravenous contrast. CONTRAST:  138mL OMNIPAQUE IOHEXOL 300 MG/ML  SOLN COMPARISON:  CT 08/26/2012 FINDINGS: Lower chest: Scattered atelectasis. No pleural fluid. Hepatobiliary: Diffusely decreased hepatic density. Heterogeneous hepatic parenchyma with suggestion of nodular contours about the anterior left lobe. Possible tiny micro nodules. No dominant hepatic lesion. Cholecystectomy without biliary dilatation. Pancreas: No ductal dilatation or inflammation. Spleen: Upper normal in size spanning 13.7 cm AP. No focal abnormality. 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 Adrenals/Urinary Tract: Normal adrenal glands. No hydronephrosis or perinephric edema. Homogeneous renal enhancement with symmetric excretion on delayed phase imaging. Previous cyst in the upper left kidney is not well seen on the current exam. There is a small parapelvic cyst in the right kidney. Urinary bladder is physiologically distended without wall thickening. Stomach/Bowel: Small hiatal hernia. Stomach is unremarkable. Small bowel loops are diffusely dilated and fluid-filled. There is fecalization of small bowel contents both in the mid small bowel as well as distal small bowel including the distal ileum. The terminal ileum is difficult to define there is no definite terminal ileal inflammation. There is no transition point to decompressed small bowel. There is mild mesenteric edema. No pneumatosis or perforation. Mild colonic tortuosity with  small volume of colonic stool. Vascular/Lymphatic: Mild aortic atherosclerosis. No aortic aneurysm. Patent portal vein. Mesenteric vessels appear patent. No bulky abdominopelvic adenopathy. Reproductive: Status post hysterectomy. No adnexal masses. Other: Mild mesenteric edema. Trace free fluid in the pelvis. No upper abdominal ascites. No free air. Musculoskeletal: L4 compression fracture with vertebral augmentation. This appears similar in appearance to lumbar MRI 05/24/2019 allowing for differences in modality. Remote bilateral anterior rib fractures with peripheral callus. No acute osseous abnormalities are seen. IMPRESSION: 1. Diffusely dilated  and fluid-filled small bowel loops with fecalization of small bowel contents. There is no transition point, however there is mild mesenteric edema and trace free fluid in the pelvis. Differential considerations include small bowel ileus versus early obstruction 2. Diffusely decreased hepatic density with suggestion of nodular contours about the anterior left lobe, raising concern for cirrhosis. Possible tiny micro nodules. Recommend correlation with cirrhosis risk factors. Consider further evaluation with hepatic protocol MRI for more detailed hepatic parenchyma characterization. Borderline splenomegaly. 3. Small hiatal hernia. 4. 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 Aortic Atherosclerosis (ICD10-I70.0). Electronically Signed   By: Keith Rake M.D.   On: 01/30/2020 20:07   DG Abd Portable 2V  Result Date: 01/31/2020 CLINICAL DATA:  NG tube EXAM: PORTABLE ABDOMEN - 2 VIEW COMPARISON:  01/30/2020 FINDINGS: NG tube is in the proximal stomach with the side port near the GE junction. Mildly prominent central small bowel loops. Prior cholecystectomy. No organomegaly or free air. IMPRESSION: NG tube tip in the proximal stomach. Electronically Signed   By: Rolm Baptise M.D.   On: 01/31/2020 08:17               LOS: 0 days    Aadil Sur  [facility]     01/31/2020, 2:36 PM

## 2020-02-01 ENCOUNTER — Inpatient Hospital Stay: Payer: Medicare Other

## 2020-02-01 DIAGNOSIS — R1084 Generalized abdominal pain: Secondary | ICD-10-CM

## 2020-02-01 DIAGNOSIS — E119 Type 2 diabetes mellitus without complications: Secondary | ICD-10-CM

## 2020-02-01 DIAGNOSIS — K56609 Unspecified intestinal obstruction, unspecified as to partial versus complete obstruction: Secondary | ICD-10-CM

## 2020-02-01 DIAGNOSIS — J449 Chronic obstructive pulmonary disease, unspecified: Secondary | ICD-10-CM

## 2020-02-01 LAB — BASIC METABOLIC PANEL
Anion gap: 11 (ref 5–15)
BUN: 10 mg/dL (ref 8–23)
CO2: 26 mmol/L (ref 22–32)
Calcium: 7.9 mg/dL — ABNORMAL LOW (ref 8.9–10.3)
Chloride: 101 mmol/L (ref 98–111)
Creatinine, Ser: 0.5 mg/dL (ref 0.44–1.00)
GFR calc Af Amer: >60 mL/min (ref >60)
GFR calc non Af Amer: >60 mL/min (ref >60)
Glucose, Bld: 148 mg/dL — ABNORMAL HIGH (ref 70–99)
Potassium: 2.8 mmol/L — ABNORMAL LOW (ref 3.5–5.1)
Sodium: 138 mmol/L (ref 135–145)

## 2020-02-01 LAB — GLUCOSE, CAPILLARY
Glucose-Capillary: 135 mg/dL — ABNORMAL HIGH (ref 70–99)
Glucose-Capillary: 138 mg/dL — ABNORMAL HIGH (ref 70–99)
Glucose-Capillary: 119 mg/dL — ABNORMAL HIGH (ref 70–99)
Glucose-Capillary: 203 mg/dL — ABNORMAL HIGH (ref 70–99)
Glucose-Capillary: 134 mg/dL — ABNORMAL HIGH (ref 70–99)

## 2020-02-01 LAB — POTASSIUM: Potassium: 4 mmol/L (ref 3.5–5.1)

## 2020-02-01 LAB — MAGNESIUM: Magnesium: 1.5 mg/dL — ABNORMAL LOW (ref 1.7–2.4)

## 2020-02-01 MED ORDER — POTASSIUM CHLORIDE 10 MEQ/100ML IV SOLN
10.0000 meq | INTRAVENOUS | Status: AC
  Administered 2020-02-01 (×4): 10 meq via INTRAVENOUS
  Filled 2020-02-01 (×2): qty 100

## 2020-02-01 MED ORDER — POTASSIUM CHLORIDE CRYS ER 20 MEQ PO TBCR
20.0000 meq | EXTENDED_RELEASE_TABLET | Freq: Two times a day (BID) | ORAL | Status: DC
  Administered 2020-02-01 – 2020-02-03 (×5): 20 meq via ORAL
  Filled 2020-02-01 (×5): qty 1

## 2020-02-01 MED ORDER — MAGNESIUM SULFATE 50 % IJ SOLN
3.0000 g | Freq: Once | INTRAVENOUS | Status: AC
  Administered 2020-02-01: 3 g via INTRAVENOUS
  Filled 2020-02-01: qty 6

## 2020-02-01 NOTE — Progress Notes (Signed)
NG tube removed without difficulty pt tolerated well

## 2020-02-01 NOTE — Progress Notes (Signed)
PROGRESS NOTE    [name]  LPF:[id number] DOB: 03-08-1956 DOA: 01/30/2020 PCP: [name], DO    Chief Complaint  Patient presents with  . Abdominal Pain    Brief Narrative:   64 year old lady with prior h/o hypertension, DM, COPD, chronic pain syndrome, presents to the clinic today history of epigastric pain associated with some nausea.  She was found to have partial small bowel obstruction.  Since evaluated by general surgery NG tube was placed.  Started on IV fluids and  Antiemetics.  Pt seen and examined.  Patient is able to pass flatulence on 2-4 movements in the last 24 hours ambulate..  NG tube was clamped for 4 hours , residual around 10 ml.   Assessment & Plan:   Principal Problem:   Partial small bowel obstruction (HCC) Active Problems:   Chronic pain syndrome   Diabetes mellitus type 2, uncomplicated (HCC)   COPD with chronic bronchitis (HCC)   SBO (small bowel obstruction) (HCC)   Partial small bowel obstruction s/p NGT placement for gastric decompression Improving. NG tube clamped earlier this am, with minimal residuals.  Plan to remove the NGT later today if her nausea is improved and there is no vomiting.  Appreciate surgery recommendations.  Continue with symptomatic management with IV fluids, IV anti emetics and pain control.  Recommend ambulation in the hallway.    Type 2 diabetes mellitus CBG (last 3)  Recent Labs    02/01/20 0442 02/01/20 0810 02/01/20 1136  GLUCAP 135* 138* 119*   Get  hemoglobin A1c. Resume sliding scale insulin.   Hypokalemia and hypomagnesemia Replaced, repeat in the morning.   Mild Anemia of chronic disease Baseline hemoglobin around 11. Currently at 10.8 Continue to monitor.     COPD: No wheezing heard.    DVT prophylaxis: LOVENOX.  Code Status:  FULL CODE.  Family Communication: (none at bedside.  Disposition:   Status is: Inpatient  Remains inpatient appropriate because:IV treatments  appropriate due to intensity of illness or inability to take PO   Dispo: The patient is from: Home              Anticipated d/c is to: pending.               Anticipated d/c date is: 1 day              Patient currently is not medically stable to d/c.       Consultants:   Gen surgery.   Procedures:none.   Antimicrobials: none.    Subjective: Nausea, has improved.  One bowel movement yesterday, able to pass flatulence, no vomiting.  No chest pain or sob.    Objective: Vitals:   01/31/20 1156 01/31/20 1947 02/01/20 0444 02/01/20 1138  BP: 120/66 126/69 135/70 131/88  Pulse: 88 89 78 85  Resp:  18 18 14   Temp: 98.1 F (36.7 C) 98.4 F (36.9 C) 98 F (36.7 C) 98.5 F (36.9 C)  TempSrc: Oral Oral Oral Oral  SpO2: 96% 93% 96% 94%  Weight:      Height:        Intake/Output Summary (Last 24 hours) at 02/01/2020 1222 Last data filed at 02/01/2020 5329 Gross per 24 hour  Intake 2637.06 ml  Output 700 ml  Net 1937.06 ml   Filed Weights   01/30/20 1553  Weight: 107.5 kg    Examination:  General exam: Appears calm and comfortable , NG TUBE In place and clamped.  Respiratory  system: Clear to auscultation. Respiratory effort normal. Cardiovascular system: S1 & S2 heard, RRR. No JVD,  Gastrointestinal system: Abdomen is nondistended, soft and nontender. Bowel sounds wnl.  Central  system: Alert and oriented. No focal neurological deficits. Extremities: Symmetric 5 x 5 power. Skin: No rashes, lesions or ulcers Psychiatry:Mood & affect appropriate.     Data Reviewed: I have personally reviewed following labs and imaging studies  CBC: Recent Labs  Lab 01/30/20 1555 01/31/20 0446  WBC 16.7* 10.8*  HGB 12.5 10.8*  HCT 38.1 33.9*  MCV 87.6 89.9  PLT 207 517    Basic Metabolic Panel: Recent Labs  Lab 01/30/20 1555 01/31/20 0446 02/01/20 0447  NA 137 140 138  K 3.9 4.1 2.8*  CL 100 104 101  CO2 23 27 26   GLUCOSE 170* 114* 148*  BUN 12 17 10     CREATININE 0.62 0.64 0.50  CALCIUM 9.3 8.8* 7.9*  MG  --   --  1.5*    GFR: Estimated Creatinine Clearance: 87.7 mL/min (by C-G formula based on SCr of 0.5 mg/dL).  Liver Function Tests: Recent Labs  Lab 01/30/20 1555  AST 40  ALT 26  ALKPHOS 118  BILITOT 1.7*  PROT 8.1  ALBUMIN 3.2*    CBG: Recent Labs  Lab 01/31/20 1947 01/31/20 2332 02/01/20 0442 02/01/20 0810 02/01/20 1136  GLUCAP 120* 115* 135* 138* 119*     Recent Results (from the past 240 hour(s))  SARS Coronavirus 2 by RT PCR (hospital order, performed in [facility] hospital lab) Nasopharyngeal Nasopharyngeal Swab     Status: None   Collection Time: 01/30/20  9:33 PM   Specimen: Nasopharyngeal Swab  Result Value Ref Range Status   SARS Coronavirus 2 NEGATIVE NEGATIVE Final    Comment: (NOTE) SARS-CoV-2 target nucleic acids are NOT DETECTED.  The SARS-CoV-2 RNA is generally detectable in upper and lower respiratory specimens during the acute phase of infection. The lowest concentration of SARS-CoV-2 viral copies this assay can detect is 250 copies / mL. A negative result does not preclude SARS-CoV-2 infection and should not be used as the sole basis for treatment or other patient management decisions.  A negative result may occur with improper specimen collection / handling, submission of specimen other than nasopharyngeal swab, presence of viral mutation(s) within the areas targeted by this assay, and inadequate number of viral copies (<250 copies / mL). A negative result must be combined with clinical observations, patient history, and epidemiological information.  Fact Sheet for Patients:   StrictlyIdeas.no  Fact Sheet for Healthcare Providers: BankingDealers.co.za  This test is not yet approved or  cleared by the [place] FDA and has been authorized for detection and/or diagnosis of SARS-CoV-2 by FDA under an Emergency Use Authorization  (EUA).  This EUA will remain in effect (meaning this test can be used) for the duration of the COVID-19 declaration under Section 564(b)(1) of the Act, 21 U.S.C. section 360bbb-3(b)(1), unless the authorization is terminated or revoked sooner.  Performed at [facility], [address] [place], [place] [address]          Radiology Studies: DG Abdomen 1 View  Result Date: 01/30/2020 CLINICAL DATA:  64 year old female NG tube placement. EXAM: ABDOMEN - 1 VIEW COMPARISON:  CT Abdomen and Pelvis earlier today. FINDINGS: AP upright view at 2229 hours. Enteric tube placed with tip in the stomach. Side hole is at the level of the gastric fundus. Excreted IV contrast in nondilated renal collecting systems. Stable cholecystectomy  clips. Paucity of bowel gas in the upper abdomen. Negative lung bases. IMPRESSION: Enteric tube placed, side hole at the level of the gastric fundus. Electronically Signed   By: Genevie Ann M.D.   On: 01/30/2020 22:39   CT ABDOMEN PELVIS W CONTRAST  Result Date: 01/30/2020 CLINICAL DATA:  Nausea and vomiting.  Acute abdominal pain. EXAM: CT ABDOMEN AND PELVIS WITH CONTRAST TECHNIQUE: Multidetector CT imaging of the abdomen and pelvis was performed using the standard protocol following bolus administration of intravenous contrast. CONTRAST:  140mL OMNIPAQUE IOHEXOL 300 MG/ML  SOLN COMPARISON:  CT 08/26/2012 FINDINGS: Lower chest: Scattered atelectasis. No pleural fluid. Hepatobiliary: Diffusely decreased hepatic density. Heterogeneous hepatic parenchyma with suggestion of nodular contours about the anterior left lobe. Possible tiny micro nodules. No dominant hepatic lesion. Cholecystectomy without biliary dilatation. Pancreas: No ductal dilatation or inflammation. Spleen: Upper normal in size spanning 13.7 cm AP. No focal abnormality. 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 Adrenals/Urinary Tract: Normal adrenal glands. No hydronephrosis or perinephric edema.  Homogeneous renal enhancement with symmetric excretion on delayed phase imaging. Previous cyst in the upper left kidney is not well seen on the current exam. There is a small parapelvic cyst in the right kidney. Urinary bladder is physiologically distended without wall thickening. Stomach/Bowel: Small hiatal hernia. Stomach is unremarkable. Small bowel loops are diffusely dilated and fluid-filled. There is fecalization of small bowel contents both in the mid small bowel as well as distal small bowel including the distal ileum. The terminal ileum is difficult to define there is no definite terminal ileal inflammation. There is no transition point to decompressed small bowel. There is mild mesenteric edema. No pneumatosis or perforation. Mild colonic tortuosity with small volume of colonic stool. Vascular/Lymphatic: Mild aortic atherosclerosis. No aortic aneurysm. Patent portal vein. Mesenteric vessels appear patent. No bulky abdominopelvic adenopathy. Reproductive: Status post hysterectomy. No adnexal masses. Other: Mild mesenteric edema. Trace free fluid in the pelvis. No upper abdominal ascites. No free air. Musculoskeletal: L4 compression fracture with vertebral augmentation. This appears similar in appearance to lumbar MRI 05/24/2019 allowing for differences in modality. Remote bilateral anterior rib fractures with peripheral callus. No acute osseous abnormalities are seen. IMPRESSION: 1. Diffusely dilated and fluid-filled small bowel loops with fecalization of small bowel contents. There is no transition point, however there is mild mesenteric edema and trace free fluid in the pelvis. Differential considerations include small bowel ileus versus early obstruction 2. Diffusely decreased hepatic density with suggestion of nodular contours about the anterior left lobe, raising concern for cirrhosis. Possible tiny micro nodules. Recommend correlation with cirrhosis risk factors. Consider further evaluation with  hepatic protocol MRI for more detailed hepatic parenchyma characterization. Borderline splenomegaly. 3. Small hiatal hernia. 4. 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 Aortic Atherosclerosis (ICD10-I70.0). Electronically Signed   By: Keith Rake M.D.   On: 01/30/2020 20:07   DG ABD ACUTE 2+V W 1V CHEST  Result Date: 02/01/2020 CLINICAL DATA:  Ileus. EXAM: DG ABDOMEN ACUTE W/ 1V CHEST COMPARISON:  January 31, 2020. FINDINGS: There is no evidence of dilated bowel loops or free intraperitoneal air. No radiopaque calculi or other significant radiographic abnormality is seen. Nasogastric tube tip is seen in proximal stomach. Status post cholecystectomy. Heart size and mediastinal contours are within normal limits. Both lungs are clear. IMPRESSION: No evidence of bowel obstruction or ileus. No acute cardiopulmonary disease. Electronically Signed   By: Marijo Conception M.D.   On: 02/01/2020 08:46  DG Abd Portable 2V  Result Date: 01/31/2020 CLINICAL DATA:  NG tube EXAM: PORTABLE ABDOMEN - 2 VIEW COMPARISON:  01/30/2020 FINDINGS: NG tube is in the proximal stomach with the side port near the GE junction. Mildly prominent central small bowel loops. Prior cholecystectomy. No organomegaly or free air. IMPRESSION: NG tube tip in the proximal stomach. Electronically Signed   By: Rolm Baptise M.D.   On: 01/31/2020 08:17        Scheduled Meds: . enoxaparin (LOVENOX) injection  40 mg Subcutaneous BID  . insulin aspart  0-20 Units Subcutaneous Q4H  . potassium chloride  20 mEq Oral BID   Continuous Infusions: . dextrose 5% lactated ringers 100 mL/hr at 02/01/20 0405  . potassium chloride 10 mEq (02/01/20 0856)     LOS: 1 day       Hosie Poisson, MD [facility]   To contact the attending provider between 7A-7P or the covering provider during after hours 7P-7A, please log into the web site [email or web address] and access using universal [employer] password for that web site.  If you do not have the password, please call the hospital operator.  02/01/2020, 12:22 PM

## 2020-02-01 NOTE — Plan of Care (Signed)
Patient had a bowel movement on the 13th, but her bowel sounds are faint. Will continue to monitor.  [name]

## 2020-02-01 NOTE — Progress Notes (Signed)
[facility] SURGICAL PROGRESS NOTE (cpt [phone])  Hospital Day(s): 1.   Interval History: Patient seen and examined, no acute events or new complaints overnight. Patient reports she is feeling better, still with some abdominal soreness but this is improved, denies fever, chills, nausea, emesis, or distension. She does have hypokalemia this morning to 2.8, otherwise BMP is unremarkable. NGT output in the last 24 hours is 725 ccs. She did have a bowel movement yesterday and is consistently flatus. She is mobilizing.   Review of Systems:  Constitutional: denies fever, chills  HEENT: denies cough or congestion  Respiratory: denies any shortness of breath  Cardiovascular: denies chest pain or palpitations  Gastrointestinal: + abdominal pain (improved), denied N/V, or diarrhea/and bowel function as per interval history Genitourinary: denies burning with urination or urinary frequency   Vital signs in last 24 hours: [min-max] current  Temp:  [97.5 F (36.4 C)-98.4 F (36.9 C)] 98 F (36.7 C) (07/14 0444) Pulse Rate:  [78-96] 78 (07/14 0444) Resp:  [18] 18 (07/14 0444) BP: (120-137)/(66-70) 135/70 (07/14 0444) SpO2:  [93 %-97 %] 96 % (07/14 0444)     Height: 5\' 5"  (165.1 cm) Weight: 107.5 kg BMI (Calculated): 39.44   Intake/Output last 2 shifts:  07/13 0701 - 07/14 0700 In: 2637.1 [I.V.:2637.1] Out: 725 [Emesis/NG output:725]   Physical Exam:  Constitutional: alert, cooperative and no distress  HENT: normocephalic without obvious abnormality, NGT in place Eyes: PERRL, EOM's grossly intact and symmetric Respiratory: breathing non-labored at rest  Cardiovascular: regular rate and sinus rhythm  Gastrointestinal: Soft, mild upper abdominal soreness, and non-distended, no rebound/guarding Musculoskeletal: no edema or wounds, motor and sensation grossly intact, NT    Labs:  CBC Latest Ref Rng & Units 01/31/2020 01/30/2020 06/08/2019  WBC 4.0 - 10.5 K/uL 10.8(H) 16.7(H) 8.6   Hemoglobin 12.0 - 15.0 g/dL 10.8(L) 12.5 11.6  Hematocrit 36 - 46 % 33.9(L) 38.1 34.9  Platelets 150 - 400 K/uL 160 207 173   CMP Latest Ref Rng & Units 02/01/2020 01/31/2020 01/30/2020  Glucose 70 - 99 mg/dL 148(H) 114(H) 170(H)  BUN 8 - 23 mg/dL 10 17 12   Creatinine 0.44 - 1.00 mg/dL 0.50 0.64 0.62  Sodium 135 - 145 mmol/L 138 140 137  Potassium 3.5 - 5.1 mmol/L 2.8(L) 4.1 3.9  Chloride 98 - 111 mmol/L 101 104 100  CO2 22 - 32 mmol/L 26 27 23   Calcium 8.9 - 10.3 mg/dL 7.9(L) 8.8(L) 9.3  Total Protein 6.5 - 8.1 g/dL - - 8.1  Total Bilirubin 0.3 - 1.2 mg/dL - - 1.7(H)  Alkaline Phos 38 - 126 U/L - - 118  AST 15 - 41 U/L - - 40  ALT 0 - 44 U/L - - 26    Imaging studies:   KUB + CXR (02/01/2020) personally reviewed and small bowel dilation does appear improved with air in the colon, and radiologist report pending   Assessment/Plan: (ICD-10's: K41.609) 64 y.o. female with clinically improved likely partial SBO secondary to post-surgical adhesive disease (ileus is also possibility).   - I will preform clamping trial of NGT this morning. Clamp tube x4 hours. After 4 hours, check residuals, if less than 150 ccs and she is without nausea/emesis then we can consider NGT removal.   - remain NPO + IVF Resuscitation  - pain control prn; antiemetics prn  - monitor abdominal examination +/- serial KUBs; on-going bowel function   - Replete K+; Monitor   - No need for emergent surgical intervention currently;  continue conservative measures  - Mobilization encouraged             - Further management per primary service; we will follow     All of the above findings and recommendations were discussed with the patient, and the medical team, and all of patient's questions were answered to her expressed satisfaction.  -- Edison Simon, PA-C [facility] 02/01/2020, 7:36 AM [phone] M-F: 7am - 4pm

## 2020-02-01 NOTE — Evaluation (Signed)
Physical Therapy Evaluation Patient Details Name: [name] MRN: [id number] DOB: 02/01/1956 Today's Date: 02/01/2020   History of Present Illness  Pt is a 64 y.o. female presenting to hospital 7/12 with epigastric pain (gradual onset about 2 days ago).  Pt admitted with partial SBO.  PMH includes COPD, DM, kidney stone, htn, prior stroke (residual L sided weakness), vertigo, back surgeries, mixed Alzheimer's and vascular dementia, and L TKA.  Clinical Impression  Prior to hospital admission, pt was ambulatory with 4ww; just moved into new home (2 doors down from daughter's home); 5 STE with B railing.  Currently pt is CGA to SBA with transfers; and CGA ambulating 110 feet with RW.  Overall pt steady and safe with functional mobility during session's activities.  Pt reporting chronic back and L knee pain during session but no abdominal pain reported when asked.  Pt would benefit from skilled PT to address noted impairments and functional limitations during hospital stay (see below for any additional details).  Upon hospital discharge, anticipate no further PT needs (pt reports feeling close to recent functional baseline).    Follow Up Recommendations No PT follow up    Equipment Recommendations  Other (comment) (pt has RW at home already)    Recommendations for Other Services       Precautions / Restrictions Precautions Precautions: Fall Restrictions Weight Bearing Restrictions: No      Mobility  Bed Mobility Overal bed mobility: Needs Assistance Bed Mobility: Supine to Sit           General bed mobility comments: pt reached out for daughter's hand to assist with sitting up; sit to semi-supine in bed modified independent  Transfers Overall transfer level: Needs assistance Equipment used: Rolling walker (2 wheeled) Transfers: Sit to/from Stand Sit to Stand: Min guard;Supervision         General transfer comment: mild increased effort to stand; CGA to stand from bed and  SBA to stand from [facility]; steady  Ambulation/Gait Ambulation/Gait assistance: Min guard Gait Distance (Feet): 110 Feet Assistive device: Rolling walker (2 wheeled)   Gait velocity: decreased   General Gait Details: partial step through gait pattern; mild decreased stance time L LE (d/t chronic L knee pain); steady  Stairs            Wheelchair Mobility    Modified Rankin (Stroke Patients Only)       Balance Overall balance assessment: Needs assistance Sitting-balance support: No upper extremity supported;Feet supported Sitting balance-Leahy Scale: Normal Sitting balance - Comments: steady sitting reaching outside BOS   Standing balance support: No upper extremity supported Standing balance-Leahy Scale: Good Standing balance comment: steady standing performing toileting hygiene and washing hands at sink                             Pertinent Vitals/Pain Pain Assessment: Faces Faces Pain Scale: Hurts a little bit Pain Location: low back and L knee Pain Descriptors / Indicators: Sore Pain Intervention(s): Limited activity within patient's tolerance;Monitored during session;Repositioned  Vitals (HR and O2 on room air) stable and WFL throughout treatment session.    Home Living Family/patient expects to be discharged to:: Private residence Living Arrangements: Alone Available Help at Discharge: Family (pt's daughter lives 2 doors down) Type of Home: House Home Access: Stairs to enter Entrance Stairs-Rails: Psychiatric nurse of Steps: [place]: One level Home Equipment: Environmental consultant - 2 wheels;Bedside commode;Shower seat;Grab bars - tub/shower;Walker - 4 wheels  Prior Function Level of Independence: Needs assistance   Gait / Transfers Assistance Needed: Modified independent ambulating with 4ww  ADL's / Homemaking Assistance Needed: Daughter assists with grocery shopping  Comments: Supportive daughter     Hand Dominance         Extremity/Trunk Assessment   Upper Extremity Assessment Upper Extremity Assessment: Generalized weakness    Lower Extremity Assessment Lower Extremity Assessment: Generalized weakness    Cervical / Trunk Assessment Cervical / Trunk Assessment: Normal  Communication   Communication: No difficulties  Cognition Arousal/Alertness: Awake/alert Behavior During Therapy: WFL for tasks assessed/performed Overall Cognitive Status: Within Functional Limits for tasks assessed                                        General Comments   Nursing cleared pt for participation in physical therapy.  Pt agreeable to PT session.  Pt's daughter present during session.    Exercises  Transfers and ambulation.   Assessment/Plan    PT Assessment Patient needs continued PT services  PT Problem List Decreased strength;Decreased activity tolerance;Decreased balance;Decreased mobility;Pain       PT Treatment Interventions DME instruction;Gait training;Stair training;Functional mobility training;Therapeutic activities;Therapeutic exercise;Balance training;Patient/family education    PT Goals (Current goals can be found in the Care Plan section)  Acute Rehab PT Goals Patient Stated Goal: to go home PT Goal Formulation: With patient Time For Goal Achievement: 02/15/20 Potential to Achieve Goals: Good    Frequency Min 2X/week   Barriers to discharge        Co-evaluation               AM-PAC PT "6 Clicks" Mobility  Outcome Measure Help needed turning from your back to your side while in a flat bed without using bedrails?: None Help needed moving from lying on your back to sitting on the side of a flat bed without using bedrails?: A Little Help needed moving to and from a bed to a chair (including a wheelchair)?: A Little Help needed standing up from a chair using your arms (e.g., wheelchair or bedside chair)?: A Little Help needed to walk in hospital room?: A Little Help  needed climbing 3-5 steps with a railing? : A Little 6 Click Score: 19    End of Session Equipment Utilized During Treatment: Gait belt Activity Tolerance: Patient tolerated treatment well Patient left: in bed;with call bell/phone within reach;with family/visitor present Nurse Communication: Mobility status;Precautions PT Visit Diagnosis: Other abnormalities of gait and mobility (R26.89);Muscle weakness (generalized) (M62.81)    Time: 5038-8828 PT Time Calculation (min) (ACUTE ONLY): 28 min   Charges:   PT Evaluation $PT Eval Low Complexity: 1 Low PT Treatments $Therapeutic Activity: 8-22 mins       Leitha Bleak, PT 02/01/20, 5:21 PM

## 2020-02-02 LAB — CBC
HCT: 32.5 % — ABNORMAL LOW (ref 36.0–46.0)
Hemoglobin: 10.9 g/dL — ABNORMAL LOW (ref 12.0–15.0)
MCH: 29.1 pg (ref 26.0–34.0)
MCHC: 33.5 g/dL (ref 30.0–36.0)
MCV: 86.9 fL (ref 80.0–100.0)
Platelets: 148 10*3/uL — ABNORMAL LOW (ref 150–400)
RBC: 3.74 MIL/uL — ABNORMAL LOW (ref 3.87–5.11)
RDW: 18.7 % — ABNORMAL HIGH (ref 11.5–15.5)
WBC: 5.6 10*3/uL (ref 4.0–10.5)
nRBC: 0 % (ref 0.0–0.2)

## 2020-02-02 LAB — BASIC METABOLIC PANEL
Anion gap: 8 (ref 5–15)
BUN: <5 mg/dL — ABNORMAL LOW (ref 8–23)
CO2: 29 mmol/L (ref 22–32)
Calcium: 8.5 mg/dL — ABNORMAL LOW (ref 8.9–10.3)
Chloride: 103 mmol/L (ref 98–111)
Creatinine, Ser: 0.44 mg/dL (ref 0.44–1.00)
GFR calc Af Amer: >60 mL/min (ref >60)
GFR calc non Af Amer: >60 mL/min (ref >60)
Glucose, Bld: 135 mg/dL — ABNORMAL HIGH (ref 70–99)
Potassium: 4.3 mmol/L (ref 3.5–5.1)
Sodium: 140 mmol/L (ref 135–145)

## 2020-02-02 LAB — GLUCOSE, CAPILLARY
Glucose-Capillary: 118 mg/dL — ABNORMAL HIGH (ref 70–99)
Glucose-Capillary: 120 mg/dL — ABNORMAL HIGH (ref 70–99)
Glucose-Capillary: 124 mg/dL — ABNORMAL HIGH (ref 70–99)
Glucose-Capillary: 127 mg/dL — ABNORMAL HIGH (ref 70–99)
Glucose-Capillary: 136 mg/dL — ABNORMAL HIGH (ref 70–99)
Glucose-Capillary: 177 mg/dL — ABNORMAL HIGH (ref 70–99)

## 2020-02-02 MED ORDER — DULOXETINE HCL 30 MG PO CPEP
60.0000 mg | ORAL_CAPSULE | Freq: Every day | ORAL | Status: DC
  Administered 2020-02-02 – 2020-02-03 (×2): 60 mg via ORAL
  Filled 2020-02-02 (×2): qty 2

## 2020-02-02 MED ORDER — ASPIRIN EC 81 MG PO TBEC
81.0000 mg | DELAYED_RELEASE_TABLET | Freq: Every day | ORAL | Status: DC
  Administered 2020-02-02 – 2020-02-03 (×2): 81 mg via ORAL
  Filled 2020-02-02 (×2): qty 1

## 2020-02-02 MED ORDER — ATORVASTATIN CALCIUM 10 MG PO TABS
10.0000 mg | ORAL_TABLET | Freq: Every day | ORAL | Status: DC
  Administered 2020-02-02 – 2020-02-03 (×2): 10 mg via ORAL
  Filled 2020-02-02 (×2): qty 1

## 2020-02-02 MED ORDER — PANTOPRAZOLE SODIUM 40 MG PO TBEC
40.0000 mg | DELAYED_RELEASE_TABLET | Freq: Every day | ORAL | Status: DC
  Administered 2020-02-02 – 2020-02-03 (×2): 40 mg via ORAL
  Filled 2020-02-02 (×2): qty 1

## 2020-02-02 NOTE — Progress Notes (Signed)
PROGRESS NOTE    [name]  DJM:[id number] DOB: Apr 16, 1956 DOA: 01/30/2020 PCP: [name], DO    Chief Complaint  Patient presents with  . Abdominal Pain    Brief Narrative:   64 year old lady with prior h/o hypertension, DM, COPD, chronic pain syndrome, presents to the clinic today history of epigastric pain associated with some nausea.  She was found to have partial small bowel obstruction.  Since evaluated by general surgery NG tube was placed.  Started on IV fluids and  Antiemetics.   Patient is able to pass flatulence , had BM yesterday. NG tbe removed and she was started on clear liquid diet. She was able to tolerate clears today, without much nausea, no vomiting or abdominal pain. Diet advanced to full liquid diet.   Assessment & Plan:   Principal Problem:   Partial small bowel obstruction (HCC) Active Problems:   Chronic pain syndrome   Diabetes mellitus type 2, uncomplicated (HCC)   COPD with chronic bronchitis (HCC)   SBO (small bowel obstruction) (HCC)   Partial small bowel obstruction s/p NGT placement for gastric decompression Much improved.  Patient is able to pass flatulence , had BM yesterday. NG tube removed and she was started on clear liquid diet. She was able to tolerate clears today, without much nausea, no vomiting or abdominal pain. Diet advanced to full liquid diet.  Appreciate surgery recommendations.  Continue with symptomatic management with IV fluids, IV anti emetics and pain control.  Recommend ambulation in the hallway.    Type 2 diabetes mellitus CBG (last 3)  Recent Labs    02/02/20 0422 02/02/20 0734 02/02/20 1139  GLUCAP 127* 118* 177*   A1c is 7.2 Resume sliding scale insulin.   Hypokalemia and hypomagnesemia Replaced,.   Mild Anemia of chronic disease Baseline hemoglobin around 11. Currently at 10.8 No bleeding.    Mild thrombocytopenia; No bleeding and platelets have been improving.     COPD: No wheezing  heard. Continue with bronchodilators as needed.    Leukocytosis Probably reactive.  WBC count this morning normal limits   DVT prophylaxis: LOVENOX.  Code Status:  FULL CODE.  Family Communication: (none at bedside.  Disposition:   Status is: Inpatient  Remains inpatient appropriate because:IV treatments appropriate due to intensity of illness or inability to take PO   Dispo: The patient is from: Home              Anticipated d/c is to: Home              Anticipated d/c date is: 1 day              Patient currently is not medically stable to d/c.       Consultants:   Gen surgery.   Procedures:none.   Antimicrobials: none.    Subjective: Nausea is much improved.  Abdominal pain has improved, no vomiting today.   Objective: Vitals:   02/01/20 1942 02/02/20 0420 02/02/20 0734 02/02/20 1139  BP: 118/72 (!) 119/59 120/69 136/71  Pulse: 90 81 77 85  Resp: 16 18 16 16   Temp: 98.1 F (36.7 C) 98.3 F (36.8 C) 98.3 F (36.8 C) 97.9 F (36.6 C)  TempSrc:  Oral Oral   SpO2: 97% 95% 94% 97%  Weight:      Height:        Intake/Output Summary (Last 24 hours) at 02/02/2020 1323 Last data filed at 02/02/2020 1106 Gross per 24 hour  Intake 2841.43  ml  Output 1600 ml  Net 1241.43 ml   Filed Weights   01/30/20 1553  Weight: 107.5 kg    Examination:  General exam: Alert and comfortable, not in any kind of distress Respiratory system: Clear to auscultation bilaterally, no wheezing or rhonchi Cardiovascular system: S1-S2 heard, regular rate rhythm, no JVD, no pedal edema Gastrointestinal system: Abdomen is soft, nontender, nondistended bowel sounds normal Central  system: Alert and oriented, grossly nonfocal Extremities: No cyanosis or clubbing Skin: No rashes seen Psychiatry: Mood is appropriate    Data Reviewed: I have personally reviewed following labs and imaging studies  CBC: Recent Labs  Lab 01/30/20 1555 01/31/20 0446 02/02/20 0739  WBC 16.7*  10.8* 5.6  HGB 12.5 10.8* 10.9*  HCT 38.1 33.9* 32.5*  MCV 87.6 89.9 86.9  PLT 207 160 148*    Basic Metabolic Panel: Recent Labs  Lab 01/30/20 1555 01/31/20 0446 02/01/20 0447 02/01/20 1339 02/02/20 0739  NA 137 140 138  --  140  K 3.9 4.1 2.8* 4.0 4.3  CL 100 104 101  --  103  CO2 23 27 26   --  29  GLUCOSE 170* 114* 148*  --  135*  BUN 12 17 10   --  <5*  CREATININE 0.62 0.64 0.50  --  0.44  CALCIUM 9.3 8.8* 7.9*  --  8.5*  MG  --   --  1.5*  --   --     GFR: Estimated Creatinine Clearance: 87.7 mL/min (by C-G formula based on SCr of 0.44 mg/dL).  Liver Function Tests: Recent Labs  Lab 01/30/20 1555  AST 40  ALT 26  ALKPHOS 118  BILITOT 1.7*  PROT 8.1  ALBUMIN 3.2*    CBG: Recent Labs  Lab 02/01/20 1945 02/01/20 2359 02/02/20 0422 02/02/20 0734 02/02/20 1139  GLUCAP 134* 120* 127* 118* 177*     Recent Results (from the past 240 hour(s))  SARS Coronavirus 2 by RT PCR (hospital order, performed in [facility] hospital lab) Nasopharyngeal Nasopharyngeal Swab     Status: None   Collection Time: 01/30/20  9:33 PM   Specimen: Nasopharyngeal Swab  Result Value Ref Range Status   SARS Coronavirus 2 NEGATIVE NEGATIVE Final    Comment: (NOTE) SARS-CoV-2 target nucleic acids are NOT DETECTED.  The SARS-CoV-2 RNA is generally detectable in upper and lower respiratory specimens during the acute phase of infection. The lowest concentration of SARS-CoV-2 viral copies this assay can detect is 250 copies / mL. A negative result does not preclude SARS-CoV-2 infection and should not be used as the sole basis for treatment or other patient management decisions.  A negative result may occur with improper specimen collection / handling, submission of specimen other than nasopharyngeal swab, presence of viral mutation(s) within the areas targeted by this assay, and inadequate number of viral copies (<250 copies / mL). A negative result must be combined with  clinical observations, patient history, and epidemiological information.  Fact Sheet for Patients:   StrictlyIdeas.no  Fact Sheet for Healthcare Providers: BankingDealers.co.za  This test is not yet approved or  cleared by the [place] FDA and has been authorized for detection and/or diagnosis of SARS-CoV-2 by FDA under an Emergency Use Authorization (EUA).  This EUA will remain in effect (meaning this test can be used) for the duration of the COVID-19 declaration under Section 564(b)(1) of the Act, 21 U.S.C. section 360bbb-3(b)(1), unless the authorization is terminated or revoked sooner.  Performed at [facility], [place]  [address] [place], [place] [address]          Radiology Studies: DG ABD ACUTE 2+V W 1V CHEST  Result Date: 02/01/2020 CLINICAL DATA:  Ileus. EXAM: DG ABDOMEN ACUTE W/ 1V CHEST COMPARISON:  January 31, 2020. FINDINGS: There is no evidence of dilated bowel loops or free intraperitoneal air. No radiopaque calculi or other significant radiographic abnormality is seen. Nasogastric tube tip is seen in proximal stomach. Status post cholecystectomy. Heart size and mediastinal contours are within normal limits. Both lungs are clear. IMPRESSION: No evidence of bowel obstruction or ileus. No acute cardiopulmonary disease. Electronically Signed   By: Marijo Conception M.D.   On: 02/01/2020 08:46        Scheduled Meds: . aspirin EC  81 mg Oral Daily  . atorvastatin  10 mg Oral Daily  . DULoxetine  60 mg Oral Daily  . enoxaparin (LOVENOX) injection  40 mg Subcutaneous BID  . insulin aspart  0-20 Units Subcutaneous Q4H  . pantoprazole  40 mg Oral Daily  . potassium chloride  20 mEq Oral BID   Continuous Infusions: . dextrose 5% lactated ringers 100 mL/hr at 02/02/20 1106     LOS: 2 days       Hosie Poisson, MD [facility]   To contact the attending provider between 7A-7P or the covering  provider during after hours 7P-7A, please log into the web site [email or web address] and access using universal [place] password for that web site. If you do not have the password, please call the hospital operator.  02/02/2020, 1:23 PM

## 2020-02-03 LAB — GLUCOSE, CAPILLARY
Glucose-Capillary: 105 mg/dL — ABNORMAL HIGH (ref 70–99)
Glucose-Capillary: 113 mg/dL — ABNORMAL HIGH (ref 70–99)
Glucose-Capillary: 128 mg/dL — ABNORMAL HIGH (ref 70–99)
Glucose-Capillary: 224 mg/dL — ABNORMAL HIGH (ref 70–99)

## 2020-02-03 MED ORDER — ONDANSETRON HCL 4 MG PO TABS: 4.0000 mg | ORAL_TABLET | Freq: Three times a day (TID) | ORAL | 0 refills | Status: DC | PRN

## 2020-02-03 NOTE — Discharge Summary (Signed)
Physician Discharge Summary  [name] OTL:[id number] DOB: Mar 15, 1956 DOA: 01/30/2020  PCP: [name], DO  Admit date: 01/30/2020 Discharge date: 02/03/2020  Admitted From: Home Disposition:  Home  Recommendations for Outpatient Follow-up:  1. Follow up with PCP in 1-2 weeks 2. Please obtain BMP/CBC in one week  Discharge Condition: stable.  CODE STATUS:FULL,  Diet recommendation: Heart Healthy   Brief/Interim Summary: 64 year old lady with prior h/o hypertension, DM, COPD, chronic pain syndrome, presents to the clinic today history of epigastric pain associated with some nausea.  She was found to have partial small bowel obstruction.  Since evaluated by general surgery NG tube was placed.  Started on IV fluids and  Antiemetics.   Patient is able to pass flatulence , had bowel movements.  NG tbe removed and she was started on clear liquid diet. She was able to tolerate clears , without much nausea, no vomiting or abdominal pain. Diet advanced to full liquid diet, then to soft diet.    Discharge Diagnoses:  Principal Problem:   Partial small bowel obstruction (HCC) Active Problems:   Chronic pain syndrome   Diabetes mellitus type 2, uncomplicated (HCC)   COPD with chronic bronchitis (HCC)   SBO (small bowel obstruction) (HCC)  Partial small bowel obstruction s/p NGT placement for gastric decompression Much improved.  Patient is able to pass flatulence , had BM yesterday. NG tube removed and she was started on clear liquid diet. She was able to tolerate clears, without much nausea, no vomiting or abdominal pain Diet advanced to full liquid diet, then to soft diet without any nausea, vomiting or abd pain.  Appreciate surgery recommendations.     Type 2 diabetes mellitus Resume home meds.   Hypokalemia and hypomagnesemia Replaced,.   Mild Anemia of chronic disease Baseline hemoglobin around 11. Currently at 10.8 No bleeding.    Mild thrombocytopenia; No  bleeding and platelets have been improving.     COPD: No wheezing heard. Continue with bronchodilators as needed.    Leukocytosis Probably reactive.  WBC count this morning normal limits     Discharge Instructions  Discharge Instructions    Diet - low sodium heart healthy   Complete by: As directed    Discharge instructions   Complete by: As directed    Try to avoid constipation.  Please follow up  with PCP in one week.     Allergies as of 02/03/2020      Reactions   Hydrocodone-acetaminophen Nausea And Vomiting      Medication List    STOP taking these medications   loperamide 2 MG tablet Commonly known as: IMODIUM A-D   methocarbamol 500 MG tablet Commonly known as: ROBAXIN   traMADol 50 MG tablet Commonly known as: ULTRAM     TAKE these medications   Accu-Chek FastClix Lancet Kit USE TO CHECK BLOOD SUGAR THREE TIMES DAILY   Accu-Chek FastClix Lancets Misc USE TO CHECK BLOOD SUGAR THREE TIMES DAILY   Accu-Chek Guide test strip Generic drug: glucose blood USE  TO CHECK BLOOD SUGAR THREE TIMES DAILY   Accu-Chek Guide w/Device Kit 1 Device by Does not apply route in the morning, at noon, and at bedtime.   albuterol 108 (90 Base) MCG/ACT inhaler Commonly known as: VENTOLIN HFA Inhale 1-2 puffs into the lungs every 6 (six) hours as needed for wheezing.   albuterol (2.5 MG/3ML) 0.083% nebulizer solution Commonly known as: PROVENTIL Take 3 mLs (2.5 mg total) by nebulization every 4 (four)  hours as needed for wheezing.   aspirin EC 81 MG tablet Take 81 mg by mouth daily.   atorvastatin 10 MG tablet Commonly known as: LIPITOR Take 1 tablet (10 mg total) by mouth daily.   B-D SINGLE USE SWABS REGULAR Pads USE  TO  CLEAN  SKIN  BEFORE CHECKING BLOOD SUGAR   CALCIUM-D PO Take 1 tablet by mouth daily.   [employer] SPECTRAVITE WOMENS SENIOR PO Take 1 tablet by mouth daily.   docusate sodium 250 MG capsule Commonly known as: COLACE Take 250 mg by  mouth daily.   DULoxetine 60 MG capsule Commonly known as: CYMBALTA Take 1 capsule (60 mg total) by mouth daily.   gabapentin 800 MG tablet Commonly known as: NEURONTIN Take 1 tablet (800 mg total) by mouth 3 (three) times daily.   Januvia 100 MG tablet Generic drug: sitaGLIPtin TAKE 1 TABLET BY MOUTH EVERY DAY IN THE EVENING What changed:   how much to take  how to take this   ketoconazole 2 % shampoo Commonly known as: NIZORAL APPLY TO AFFECTED AREA TOPICALLY 2 (TWO) TIMES A WEEK.   loratadine 10 MG tablet Commonly known as: CLARITIN Take 10 mg by mouth daily.   meclizine 12.5 MG tablet Commonly known as: ANTIVERT TAKE 1 TABLET 2 TIMES DAILY AS NEEDED FOR DIZZINESS.   Medihoney Ca Alginate 2"x2" Pads APPLY 1 UNITS TOPICALLY ONCE DAILY   metFORMIN 1000 MG tablet Commonly known as: GLUCOPHAGE Take 1 tablet (1,000 mg total) by mouth 2 (two) times daily with a meal.   omeprazole 20 MG capsule Commonly known as: PRILOSEC TAKE 1 CAPSULE BY MOUTH EVERY DAY IN THE EVENING   ondansetron 4 MG tablet Commonly known as: ZOFRAN Take 1 tablet (4 mg total) by mouth every 8 (eight) hours as needed for nausea or vomiting. What changed:   when to take this  reasons to take this   Ozempic (0.25 or 0.5 MG/DOSE) 2 MG/1.5ML Sopn Generic drug: Semaglutide(0.25 or 0.5MG/DOS) Inject 0.5 mg into the skin once a week.   triamcinolone 0.025 % cream Commonly known as: KENALOG Apply 1 application topically 3 (three) times daily as needed (rash).       Allergies  Allergen Reactions  . Hydrocodone-Acetaminophen Nausea And Vomiting    Consultations:  General surgery.    Procedures/Studies: DG Abdomen 1 View  Result Date: 01/30/2020 CLINICAL DATA:  64 year old female NG tube placement. EXAM: ABDOMEN - 1 VIEW COMPARISON:  CT Abdomen and Pelvis earlier today. FINDINGS: AP upright view at 2229 hours. Enteric tube placed with tip in the stomach. Side hole is at the level of the  gastric fundus. Excreted IV contrast in nondilated renal collecting systems. Stable cholecystectomy clips. Paucity of bowel gas in the upper abdomen. Negative lung bases. IMPRESSION: Enteric tube placed, side hole at the level of the gastric fundus. Electronically Signed   By: Genevie Ann M.D.   On: 01/30/2020 22:39   CT ABDOMEN PELVIS W CONTRAST  Result Date: 01/30/2020 CLINICAL DATA:  Nausea and vomiting.  Acute abdominal pain. EXAM: CT ABDOMEN AND PELVIS WITH CONTRAST TECHNIQUE: Multidetector CT imaging of the abdomen and pelvis was performed using the standard protocol following bolus administration of intravenous contrast. CONTRAST:  141m OMNIPAQUE IOHEXOL 300 MG/ML  SOLN COMPARISON:  CT 08/26/2012 FINDINGS: Lower chest: Scattered atelectasis. No pleural fluid. Hepatobiliary: Diffusely decreased hepatic density. Heterogeneous hepatic parenchyma with suggestion of nodular contours about the anterior left lobe. Possible tiny micro nodules. No dominant hepatic lesion. Cholecystectomy without  biliary dilatation. Pancreas: No ductal dilatation or inflammation. Spleen: Upper normal in size spanning 13.7 cm AP. No focal abnormality. 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 Adrenals/Urinary Tract: Normal adrenal glands. No hydronephrosis or perinephric edema. Homogeneous renal enhancement with symmetric excretion on delayed phase imaging. Previous cyst in the upper left kidney is not well seen on the current exam. There is a small parapelvic cyst in the right kidney. Urinary bladder is physiologically distended without wall thickening. Stomach/Bowel: Small hiatal hernia. Stomach is unremarkable. Small bowel loops are diffusely dilated and fluid-filled. There is fecalization of small bowel contents both in the mid small bowel as well as distal small bowel including the distal ileum. The terminal ileum is difficult to define there is no definite terminal ileal inflammation. There is no transition point to  decompressed small bowel. There is mild mesenteric edema. No pneumatosis or perforation. Mild colonic tortuosity with small volume of colonic stool. Vascular/Lymphatic: Mild aortic atherosclerosis. No aortic aneurysm. Patent portal vein. Mesenteric vessels appear patent. No bulky abdominopelvic adenopathy. Reproductive: Status post hysterectomy. No adnexal masses. Other: Mild mesenteric edema. Trace free fluid in the pelvis. No upper abdominal ascites. No free air. Musculoskeletal: L4 compression fracture with vertebral augmentation. This appears similar in appearance to lumbar MRI 05/24/2019 allowing for differences in modality. Remote bilateral anterior rib fractures with peripheral callus. No acute osseous abnormalities are seen. IMPRESSION: 1. Diffusely dilated and fluid-filled small bowel loops with fecalization of small bowel contents. There is no transition point, however there is mild mesenteric edema and trace free fluid in the pelvis. Differential considerations include small bowel ileus versus early obstruction 2. Diffusely decreased hepatic density with suggestion of nodular contours about the anterior left lobe, raising concern for cirrhosis. Possible tiny micro nodules. Recommend correlation with cirrhosis risk factors. Consider further evaluation with hepatic protocol MRI for more detailed hepatic parenchyma characterization. Borderline splenomegaly. 3. Small hiatal hernia. 4. 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 Aortic Atherosclerosis (ICD10-I70.0). Electronically Signed   By: Keith Rake M.D.   On: 01/30/2020 20:07   DG ABD ACUTE 2+V W 1V CHEST  Result Date: 02/01/2020 CLINICAL DATA:  Ileus. EXAM: DG ABDOMEN ACUTE W/ 1V CHEST COMPARISON:  January 31, 2020. FINDINGS: There is no evidence of dilated bowel loops or free intraperitoneal air. No radiopaque calculi or other significant radiographic abnormality is seen. Nasogastric tube tip is seen in proximal stomach.  Status post cholecystectomy. Heart size and mediastinal contours are within normal limits. Both lungs are clear. IMPRESSION: No evidence of bowel obstruction or ileus. No acute cardiopulmonary disease. Electronically Signed   By: Marijo Conception M.D.   On: 02/01/2020 08:46   DG Abd Portable 2V  Result Date: 01/31/2020 CLINICAL DATA:  NG tube EXAM: PORTABLE ABDOMEN - 2 VIEW COMPARISON:  01/30/2020 FINDINGS: NG tube is in the proximal stomach with the side port near the GE junction. Mildly prominent central small bowel loops. Prior cholecystectomy. No organomegaly or free air. IMPRESSION: NG tube tip in the proximal stomach. Electronically Signed   By: Rolm Baptise M.D.   On: 01/31/2020 08:17       Subjective: No new complaints.   Discharge Exam: Vitals:   02/03/20 0456 02/03/20 1143  BP: 119/65 118/78  Pulse: 80 86  Resp: (!) 24 20  Temp: 98.1 F (36.7 C) 98.1 F (36.7 C)  SpO2: 94% 95%   Vitals:   02/02/20 1139 02/02/20 2002 02/03/20 0456 02/03/20 1143  BP: 136/71  121/69 119/65 118/78  Pulse: 85 90 80 86  Resp: 16 18 (!) 24 20  Temp: 97.9 F (36.6 C) 98.1 F (36.7 C) 98.1 F (36.7 C) 98.1 F (36.7 C)  TempSrc:  Oral Oral Oral  SpO2: 97% 98% 94% 95%  Weight:      Height:        General: Pt is alert, awake, not in acute distress Cardiovascular: RRR, S1/S2 +, no rubs, no gallops Respiratory: CTA bilaterally, no wheezing, no rhonchi Abdominal: Soft, NT, ND, bowel sounds + Extremities: no edema, no cyanosis    The results of significant diagnostics from this hospitalization (including imaging, microbiology, ancillary and laboratory) are listed below for reference.     Microbiology: Recent Results (from the past 240 hour(s))  SARS Coronavirus 2 by RT PCR (hospital order, performed in [facility] hospital lab) Nasopharyngeal Nasopharyngeal Swab     Status: None   Collection Time: 01/30/20  9:33 PM   Specimen: Nasopharyngeal Swab  Result Value Ref Range Status   SARS  Coronavirus 2 NEGATIVE NEGATIVE Final    Comment: (NOTE) SARS-CoV-2 target nucleic acids are NOT DETECTED.  The SARS-CoV-2 RNA is generally detectable in upper and lower respiratory specimens during the acute phase of infection. The lowest concentration of SARS-CoV-2 viral copies this assay can detect is 250 copies / mL. A negative result does not preclude SARS-CoV-2 infection and should not be used as the sole basis for treatment or other patient management decisions.  A negative result may occur with improper specimen collection / handling, submission of specimen other than nasopharyngeal swab, presence of viral mutation(s) within the areas targeted by this assay, and inadequate number of viral copies (<250 copies / mL). A negative result must be combined with clinical observations, patient history, and epidemiological information.  Fact Sheet for Patients:   StrictlyIdeas.no  Fact Sheet for Healthcare Providers: BankingDealers.co.za  This test is not yet approved or  cleared by the [place] FDA and has been authorized for detection and/or diagnosis of SARS-CoV-2 by FDA under an Emergency Use Authorization (EUA).  This EUA will remain in effect (meaning this test can be used) for the duration of the COVID-19 declaration under Section 564(b)(1) of the Act, 21 U.S.C. section 360bbb-3(b)(1), unless the authorization is terminated or revoked sooner.  Performed at [facility], [address] [place], [place] [address]      Labs: BNP (last 3 results) No results for input(s): BNP in the last 8760 hours. Basic Metabolic Panel: Recent Labs  Lab 01/30/20 1555 01/31/20 0446 02/01/20 0447 02/01/20 1339 02/02/20 0739  NA 137 140 138  --  140  K 3.9 4.1 2.8* 4.0 4.3  CL 100 104 101  --  103  CO2 '23 27 26  ' --  29  GLUCOSE 170* 114* 148*  --  135*  BUN '12 17 10  ' --  <5*  CREATININE 0.62 0.64 0.50  --  0.44  CALCIUM  9.3 8.8* 7.9*  --  8.5*  MG  --   --  1.5*  --   --    Liver Function Tests: Recent Labs  Lab 01/30/20 1555  AST 40  ALT 26  ALKPHOS 118  BILITOT 1.7*  PROT 8.1  ALBUMIN 3.2*   Recent Labs  Lab 01/30/20 1555  LIPASE 27   No results for input(s): AMMONIA in the last 168 hours. CBC: Recent Labs  Lab 01/30/20 1555 01/31/20 0446 02/02/20 0739  WBC 16.7* 10.8* 5.6  HGB  12.5 10.8* 10.9*  HCT 38.1 33.9* 32.5*  MCV 87.6 89.9 86.9  PLT 207 160 148*   Cardiac Enzymes: No results for input(s): CKTOTAL, CKMB, CKMBINDEX, TROPONINI in the last 168 hours. BNP: Invalid input(s): POCBNP CBG: Recent Labs  Lab 02/02/20 2025 02/03/20 0007 02/03/20 0453 02/03/20 0739 02/03/20 1141  GLUCAP 136* 105* 128* 113* 224*   D-Dimer No results for input(s): DDIMER in the last 72 hours. Hgb A1c No results for input(s): HGBA1C in the last 72 hours. Lipid Profile No results for input(s): CHOL, HDL, LDLCALC, TRIG, CHOLHDL, LDLDIRECT in the last 72 hours. Thyroid function studies No results for input(s): TSH, T4TOTAL, T3FREE, THYROIDAB in the last 72 hours.  Invalid input(s): FREET3 Anemia work up No results for input(s): VITAMINB12, FOLATE, FERRITIN, TIBC, IRON, RETICCTPCT in the last 72 hours. Urinalysis    Component Value Date/Time   COLORURINE AMBER (A) 01/30/2020 2133   APPEARANCEUR HAZY (A) 01/30/2020 2133   APPEARANCEUR Hazy 10/21/2014 1320   LABSPEC >1.046 (H) 01/30/2020 2133   LABSPEC 1.015 10/21/2014 1320   PHURINE 5.0 01/30/2020 2133   GLUCOSEU NEGATIVE 01/30/2020 2133   GLUCOSEU Negative 10/21/2014 1320   HGBUR NEGATIVE 01/30/2020 2133   HGBUR trace-intact 04/26/2010 1614   BILIRUBINUR NEGATIVE 01/30/2020 2133   BILIRUBINUR Negative 10/21/2014 [place] 01/30/2020 2133   PROTEINUR 30 (A) 01/30/2020 2133   UROBILINOGEN 0.2 08/25/2012 2117   NITRITE NEGATIVE 01/30/2020 2133   LEUKOCYTESUR NEGATIVE 01/30/2020 2133   LEUKOCYTESUR Negative 10/21/2014  1320   Sepsis Labs Invalid input(s): PROCALCITONIN,  WBC,  LACTICIDVEN Microbiology Recent Results (from the past 240 hour(s))  SARS Coronavirus 2 by RT PCR (hospital order, performed in [place] hospital lab) Nasopharyngeal Nasopharyngeal Swab     Status: None   Collection Time: 01/30/20  9:33 PM   Specimen: Nasopharyngeal Swab  Result Value Ref Range Status   SARS Coronavirus 2 NEGATIVE NEGATIVE Final    Comment: (NOTE) SARS-CoV-2 target nucleic acids are NOT DETECTED.  The SARS-CoV-2 RNA is generally detectable in upper and lower respiratory specimens during the acute phase of infection. The lowest concentration of SARS-CoV-2 viral copies this assay can detect is 250 copies / mL. A negative result does not preclude SARS-CoV-2 infection and should not be used as the sole basis for treatment or other patient management decisions.  A negative result may occur with improper specimen collection / handling, submission of specimen other than nasopharyngeal swab, presence of viral mutation(s) within the areas targeted by this assay, and inadequate number of viral copies (<250 copies / mL). A negative result must be combined with clinical observations, patient history, and epidemiological information.  Fact Sheet for Patients:   StrictlyIdeas.no  Fact Sheet for Healthcare Providers: BankingDealers.co.za  This test is not yet approved or  cleared by the [place] FDA and has been authorized for detection and/or diagnosis of SARS-CoV-2 by FDA under an Emergency Use Authorization (EUA).  This EUA will remain in effect (meaning this test can be used) for the duration of the COVID-19 declaration under Section 564(b)(1) of the Act, 21 U.S.C. section 360bbb-3(b)(1), unless the authorization is terminated or revoked sooner.  Performed at [facility], [address], [place], [place] [address]      Time coordinating  discharge: 34  minutes  SIGNED:   Hosie Poisson, MD  [facility] 02/03/2020, 1:25 PM

## 2020-02-03 NOTE — Care Management Important Message (Signed)
Important Message  Patient Details  Name: [name] MRN: [id number] Date of Birth: 03/03/1956   Medicare Important Message Given:  Yes     Dannette Barbara 02/03/2020, 11:21 AM

## 2020-02-16 ENCOUNTER — Encounter: Payer: Self-pay | Admitting: Podiatry

## 2020-02-23 ENCOUNTER — Other Ambulatory Visit: Payer: Self-pay | Admitting: Family Medicine

## 2020-02-23 DIAGNOSIS — H8110 Benign paroxysmal vertigo, unspecified ear: Secondary | ICD-10-CM

## 2020-02-27 ENCOUNTER — Other Ambulatory Visit: Payer: Self-pay | Admitting: Family Medicine

## 2020-02-27 DIAGNOSIS — E119 Type 2 diabetes mellitus without complications: Secondary | ICD-10-CM

## 2020-03-12 DIAGNOSIS — F0390 Unspecified dementia without behavioral disturbance: Secondary | ICD-10-CM | POA: Insufficient documentation

## 2020-03-18 ENCOUNTER — Encounter: Payer: Self-pay | Admitting: Family Medicine

## 2020-03-18 DIAGNOSIS — R112 Nausea with vomiting, unspecified: Secondary | ICD-10-CM

## 2020-03-18 DIAGNOSIS — G894 Chronic pain syndrome: Secondary | ICD-10-CM

## 2020-03-18 DIAGNOSIS — H8110 Benign paroxysmal vertigo, unspecified ear: Secondary | ICD-10-CM

## 2020-03-19 MED ORDER — GABAPENTIN 800 MG PO TABS: 800.0000 mg | ORAL_TABLET | Freq: Three times a day (TID) | ORAL | 1 refills | Status: DC

## 2020-03-19 MED ORDER — ONDANSETRON HCL 4 MG PO TABS: 4.0000 mg | ORAL_TABLET | Freq: Three times a day (TID) | ORAL | 0 refills | Status: DC | PRN

## 2020-04-11 ENCOUNTER — Ambulatory Visit: Payer: Medicare Other | Admitting: Podiatry

## 2020-04-16 ENCOUNTER — Other Ambulatory Visit: Payer: Self-pay | Admitting: Family Medicine

## 2020-04-16 DIAGNOSIS — E119 Type 2 diabetes mellitus without complications: Secondary | ICD-10-CM

## 2020-04-17 ENCOUNTER — Encounter: Payer: Self-pay | Admitting: Family Medicine

## 2020-04-18 ENCOUNTER — Other Ambulatory Visit: Payer: Self-pay | Admitting: Family Medicine

## 2020-04-18 DIAGNOSIS — F39 Unspecified mood [affective] disorder: Secondary | ICD-10-CM

## 2020-04-18 DIAGNOSIS — R112 Nausea with vomiting, unspecified: Secondary | ICD-10-CM

## 2020-04-18 DIAGNOSIS — E119 Type 2 diabetes mellitus without complications: Secondary | ICD-10-CM

## 2020-04-18 DIAGNOSIS — H8110 Benign paroxysmal vertigo, unspecified ear: Secondary | ICD-10-CM

## 2020-04-18 DIAGNOSIS — G894 Chronic pain syndrome: Secondary | ICD-10-CM

## 2020-04-18 MED ORDER — SITAGLIPTIN PHOSPHATE 100 MG PO TABS: 100.0000 mg | ORAL_TABLET | Freq: Every evening | ORAL | 0 refills | Status: DC

## 2020-04-18 MED ORDER — DULOXETINE HCL 60 MG PO CPEP: 60.0000 mg | ORAL_CAPSULE | Freq: Every day | ORAL | 3 refills | Status: DC

## 2020-04-18 MED ORDER — OZEMPIC (0.25 OR 0.5 MG/DOSE) 2 MG/1.5ML ~~LOC~~ SOPN: 0.5000 mg | PEN_INJECTOR | SUBCUTANEOUS | 2 refills | Status: DC

## 2020-04-18 MED ORDER — ONDANSETRON HCL 4 MG PO TABS: 4.0000 mg | ORAL_TABLET | Freq: Three times a day (TID) | ORAL | 1 refills | Status: DC | PRN

## 2020-05-09 ENCOUNTER — Other Ambulatory Visit: Payer: Self-pay | Admitting: Family Medicine

## 2020-05-09 DIAGNOSIS — M25561 Pain in right knee: Secondary | ICD-10-CM

## 2020-05-14 ENCOUNTER — Other Ambulatory Visit: Payer: Self-pay | Admitting: Family Medicine

## 2020-05-14 DIAGNOSIS — E119 Type 2 diabetes mellitus without complications: Secondary | ICD-10-CM

## 2020-05-14 DIAGNOSIS — E785 Hyperlipidemia, unspecified: Secondary | ICD-10-CM

## 2020-05-14 DIAGNOSIS — E1169 Type 2 diabetes mellitus with other specified complication: Secondary | ICD-10-CM

## 2020-05-18 ENCOUNTER — Other Ambulatory Visit: Payer: Self-pay

## 2020-05-18 ENCOUNTER — Ambulatory Visit (INDEPENDENT_AMBULATORY_CARE_PROVIDER_SITE_OTHER): Payer: Medicare Other | Admitting: Family Medicine

## 2020-05-18 ENCOUNTER — Encounter: Payer: Self-pay | Admitting: Family Medicine

## 2020-05-18 VITALS — BP 112/60 | HR 98 | Ht 65.0 in | Wt 230.2 lb

## 2020-05-18 DIAGNOSIS — E119 Type 2 diabetes mellitus without complications: Secondary | ICD-10-CM

## 2020-05-18 DIAGNOSIS — J0100 Acute maxillary sinusitis, unspecified: Secondary | ICD-10-CM

## 2020-05-18 DIAGNOSIS — E049 Nontoxic goiter, unspecified: Secondary | ICD-10-CM

## 2020-05-18 DIAGNOSIS — R7989 Other specified abnormal findings of blood chemistry: Secondary | ICD-10-CM

## 2020-05-18 DIAGNOSIS — R221 Localized swelling, mass and lump, neck: Secondary | ICD-10-CM | POA: Diagnosis not present

## 2020-05-18 LAB — POCT GLYCOSYLATED HEMOGLOBIN (HGB A1C): Hemoglobin A1C: 6.8 % — AB (ref 4.0–5.6)

## 2020-05-18 MED ORDER — AMOXICILLIN 875 MG PO TABS: 875.0000 mg | ORAL_TABLET | Freq: Two times a day (BID) | ORAL | 0 refills | Status: AC

## 2020-05-18 MED ORDER — FLUTICASONE PROPIONATE 50 MCG/ACT NA SUSP: 2.0000 | Freq: Every day | NASAL | 6 refills | Status: DC

## 2020-05-18 NOTE — Patient Instructions (Addendum)
It was a pleasure to see you today!  Thank you for choosing [facility] for your primary care.  [name] was seen for diabetes and neck lump  Our plans for today were:  You A1C was improved from 7.2 to 6.8! Congratulations. Continue your current medications as prescribed. Lets follow up in 3 months for continued monitoring  For your neck lump I am ordering an ultrasound and x-ray. Please have this completed as scheduled.   For your sinus headache. It sounds like you have a Sinusitis (Bacterial Infection) - this most likely started as an Upper Respiratory Virus that has settled into an infection. I have prescribed you Amoxicillin 875mg  twice a day for 7 days. Please start using flonase in each nostril daily to help with the inflammation and symptoms. Use a humidifier at night and vaseline in the nose once a day.  I will call you with the results.   To keep you healthy, please keep in mind the following health maintenance items that you are due for:   1. Flu vaccine   You should return to our clinic in 3 months for diabetes follow up.   Best Wishes,   Mina Marble, DO

## 2020-05-18 NOTE — Assessment & Plan Note (Addendum)
Repeat TSH in May 2021 resolved. Will follow up Thyroid U/S and consider TSH value pending results.

## 2020-05-18 NOTE — Progress Notes (Signed)
Subjective:   [name]    DOB: 1956/02/07, 64 y.o. female   MRN: [id number]  [name] is a 64 y.o. female with a history of mixed Alzheimer's/vascular dementia, COPD, GERD, history of partial SBO, type 2 diabetes, hyperlipidemia, vertigo, DJD, osteopenia, morbid obesity, vitamin D deficiency, urge incontinence, impaired functional mobility, chronic pain syndrome in setting up spondylosis of lumbar spine here for diabetes follow-up and lump on neck.  Diabetes: Last three A1C's below. Currently on Januvia 100mg  QD, Metformin 1000mg  BID, Semagluitde 0.5mg  qweekly. Endorses compliance. Notes CBGs range 160-170. Denies any hypoglycemia. Denies any polyuria, polydipsia, polyphagia. .  Lab Results  Component Value Date   HGBA1C 6.8 (A) 05/18/2020   HGBA1C 7.2 (H) 01/31/2020   HGBA1C 7.6 (A) 12/13/2019   Health Maintenance: Health Maintenance Due  Topic  . INFLUENZA VACCINE    Right sided Neck lump: Patient noticed a lump on the right side of her neck x 3 weeks ago. Denies any pain. Bothers her a little when swallows large bolus. Denies any trauma. Nonsmoker. She denies difficulty eating or drinking. Has thyroid ultrasound in 2002 that was notable for diffusely enlarged and inhomogenous thyroid most consistent with multinodular goiter.  No discrete nodule is seen.  The right lobe of the thyroid measures 5.9 cm sagittally with a depth of 2.6 cm and a width of 2.4 cm.the left lobe measures 6 cm sagittally with a depth of 2.7 cm and a width of 2.1 cm.  Multiple areas of increased and decreased echogenicity are present throughout the thyroid consistent with multinodular goiter no adenopathy is seen.   Headache: Patient notes that she has had headache around her nose and eyes for 1 month. Seems to be getting worse. She has tried treating it with ibuprofen with no improvement. She endorses rhinorrhea. Denies any symptoms with her ears. Denies fever or chills.  Review of  Systems:  Per HPI.   Objective:   BP 112/60   Pulse 98   Ht 5\' 5"  (1.651 m)   Wt 230 lb 3.2 oz (104.4 kg)   SpO2 99%   BMI 38.31 kg/m  Vitals and nursing note reviewed.  General: pleasant older female, sitting comfortably in exam chair, well nourished, well developed, in no acute distress with non-toxic appearance HEENT: normocephalic, atraumatic, moist mucous membranes, oropharynx clear without erythema or exudate, TM normal bilaterally with small fluid level, turbinates inflamed and erythematous bilaterally, pain appreciated on right maxillary sinus to palpation Neck: supple, non-tender without lymphadenopathy, R>L enlargement of thyroid gland, nontender to palpation. Anterior base of neck, just right of midline, hard lump appreciated which feels like clavicular head that is noticeably more anterior than left, nontender to palpation, no erythema or swelling overlying skin  CV: regular rate and rhythm without murmurs, rubs, or gallops Lungs: clear to auscultation bilaterally with normal work of breathing on room air Resp: speaking in full sentences Skin: warm, dry Extremities: warm and well perfused, normal tone MSK: ROM grossly intact, strength intact, gait normal Neuro: Alert and oriented, speech normal  Assessment & Plan:   Abnormal TSH Repeat TSH in May 2021 resolved. Will follow up Thyroid U/S and consider TSH value pending results.  Diabetes mellitus type 2, uncomplicated (HCC) Chronic, controlled. On statin.  - continue Januvia 100mg  QD, Metformin 1000mg  BID, Semagluitde 0.5mg  qweekly - up to date on health maintenance  - consider adding very low dose ACE/ARB at follow up for kidney protection  Lump on neck Acute, unclear  etiology. Lump is hard to palpation and feels consistent with anteriorly placed clavicular head. Has history of enlarged thyroid which is evident and separately discernable on exam. Unclear cause for displacement of clavicale. Will obtain further imaging  for further evaluation.  - US thyroid and CXR - consider head/neck soft tissue U/S pending above results - pending results will have patient return for TSH value   Subacute maxillary sinusitis Subacute, worsening. History and exam concerning for sinusitis.  - Amoxicillin 875mg  BID x 7 days  - Flonase daily  - RTC if no improvement, sooner if worsening  Orders Placed This Encounter  Procedures  . US THYROID    [employer] [facility] Epic order No Travel/NO Covid Wt: 47 Walker No Spinal Stimulator or Body Injector no glucose monitors EW sw April [phone]    Standing Status:   Future    Standing Expiration Date:   05/18/2021    Order Specific Question:   Reason for Exam (SYMPTOM  OR DIAGNOSIS REQUIRED)    Answer:   lump on right side of neck, history of enlarged thyroid, multinodular goiter, last thyroid u/s in 2002    Order Specific Question:   Preferred imaging location?    Answer:   [facility]  . DG Chest 2 View    Standing Status:   Future    Standing Expiration Date:   05/18/2021    Order Specific Question:   Reason for Exam (SYMPTOM  OR DIAGNOSIS REQUIRED)    Answer:   lump on neck, concern for clavicular pathology    Order Specific Question:   Preferred imaging location?    Answer:   [facility]  . TSH    Standing Status:   Future    Standing Expiration Date:   05/19/2021  . POCT glycosylated hemoglobin (Hb A1C)   Meds ordered this encounter  Medications  . amoxicillin (AMOXIL) 875 MG tablet    Sig: Take 1 tablet (875 mg total) by mouth 2 (two) times daily for 7 days.    Dispense:  14 tablet    Refill:  0  . fluticasone (FLONASE) 50 MCG/ACT nasal spray    Sig: Place 2 sprays into both nostrils daily.    Dispense:  16 g    Refill:  [place], DO PGY-3, [facility] 05/21/2020 4:52 PM

## 2020-05-19 DIAGNOSIS — J0100 Acute maxillary sinusitis, unspecified: Secondary | ICD-10-CM | POA: Insufficient documentation

## 2020-05-19 DIAGNOSIS — R221 Localized swelling, mass and lump, neck: Secondary | ICD-10-CM | POA: Insufficient documentation

## 2020-05-19 HISTORY — DX: Acute maxillary sinusitis, unspecified: J01.00

## 2020-05-19 NOTE — Assessment & Plan Note (Addendum)
Acute, unclear etiology. Lump is hard to palpation and feels consistent with anteriorly placed clavicular head. Has history of enlarged thyroid which is evident and separately discernable on exam. Unclear cause for displacement of clavicale. Will obtain further imaging for further evaluation.  - US thyroid and CXR - consider head/neck soft tissue U/S pending above results - pending results will have patient return for TSH value

## 2020-05-19 NOTE — Assessment & Plan Note (Signed)
Subacute, worsening. History and exam concerning for sinusitis.  - Amoxicillin 875mg  BID x 7 days  - Flonase daily  - RTC if no improvement, sooner if worsening

## 2020-05-19 NOTE — Assessment & Plan Note (Signed)
Chronic, controlled. On statin.  - continue Januvia 100mg  QD, Metformin 1000mg  BID, Semagluitde 0.5mg  qweekly - up to date on health maintenance  - consider adding very low dose ACE/ARB at follow up for kidney protection

## 2020-05-24 ENCOUNTER — Ambulatory Visit
Admission: RE | Admit: 2020-05-24 | Discharge: 2020-05-24 | Disposition: A | Discharge status: Home / Self Care | Payer: Medicare Other | Source: Ambulatory Visit | Attending: Family Medicine | Admitting: Family Medicine

## 2020-05-24 DIAGNOSIS — R221 Localized swelling, mass and lump, neck: Secondary | ICD-10-CM

## 2020-05-31 ENCOUNTER — Other Ambulatory Visit: Payer: Self-pay | Admitting: Family Medicine

## 2020-05-31 DIAGNOSIS — E041 Nontoxic single thyroid nodule: Secondary | ICD-10-CM

## 2020-05-31 DIAGNOSIS — E049 Nontoxic goiter, unspecified: Secondary | ICD-10-CM

## 2020-06-04 NOTE — Progress Notes (Signed)
Looks like pt is already scheduled. [name], CMA

## 2020-06-07 ENCOUNTER — Other Ambulatory Visit: Payer: Self-pay | Admitting: Family Medicine

## 2020-06-12 ENCOUNTER — Other Ambulatory Visit: Payer: Self-pay | Admitting: Family Medicine

## 2020-06-12 ENCOUNTER — Ambulatory Visit
Admission: RE | Admit: 2020-06-12 | Discharge: 2020-06-12 | Disposition: A | Discharge status: Home / Self Care | Payer: Medicare Other | Source: Ambulatory Visit | Attending: Family Medicine | Admitting: Family Medicine

## 2020-06-12 DIAGNOSIS — E041 Nontoxic single thyroid nodule: Secondary | ICD-10-CM

## 2020-06-12 DIAGNOSIS — E049 Nontoxic goiter, unspecified: Secondary | ICD-10-CM

## 2020-06-30 ENCOUNTER — Other Ambulatory Visit: Payer: Self-pay | Admitting: Family Medicine

## 2020-07-02 ENCOUNTER — Other Ambulatory Visit: Payer: Self-pay

## 2020-07-02 ENCOUNTER — Encounter: Payer: Self-pay | Admitting: Family Medicine

## 2020-07-02 ENCOUNTER — Ambulatory Visit (INDEPENDENT_AMBULATORY_CARE_PROVIDER_SITE_OTHER): Payer: Medicare Other | Admitting: Family Medicine

## 2020-07-02 VITALS — BP 112/60 | HR 103 | Ht 65.0 in | Wt 234.5 lb

## 2020-07-02 DIAGNOSIS — R221 Localized swelling, mass and lump, neck: Secondary | ICD-10-CM | POA: Diagnosis not present

## 2020-07-02 DIAGNOSIS — E010 Iodine-deficiency related diffuse (endemic) goiter: Secondary | ICD-10-CM | POA: Diagnosis not present

## 2020-07-02 NOTE — Progress Notes (Signed)
Spoke with pt informed her of appt at GI on Jan 4th at 1:20om at [facility]. Pt understood. Salvatore Marvel, CMA

## 2020-07-02 NOTE — Progress Notes (Signed)
   Subjective:   [name]    DOB: 14-Jan-1956, 64 y.o. female   MRN: [id number]  [name] is a 64 y.o. female with a history of mixed Alzheimer's/vascular dementia, COPD, GERD, history of partial SBO, type 2 diabetes, hyperlipidemia, vertigo, DJD, osteopenia, morbid obesity, vitamin D deficiency, urge incontinence, impaired functional mobility, chronic pain syndrome in setting up spondylosis of lumbar spine here for lump on neck follow up.  Neck Mass:  None 05/18/2020 for right-sided neck lump that had been there for 1 month.  She denied pain, trauma, difficulty eating, drinking.  She does have a history of thyromegaly thus thyroid ultrasound was obtained and consistent with prior findings.  Initially noted a nodule located in the isthmus however upon reevaluation this was no longer present, concerning for pseudonodule.  No biopsies were obtained. Last TSH 2.72 (12/21/19), T4 0.83 (08/27/17). She notes that the nodule is still present and feels like it is getting a little larger.   Review of Systems:  Per HPI.   Objective:   BP 112/60   Pulse (!) 103   Ht 5\' 5"  (1.651 m)   Wt 234 lb 8 oz (106.4 kg)   SpO2 98%   BMI 39.02 kg/m  Vitals and nursing note reviewed.  General: pleasant older female, sitting comfortably in exam chair, well nourished, well developed, in no acute distress with non-toxic appearance HEENT: normocephalic, atraumatic Neck: thyromegaly with enlarged neck size, anterior just lateral to midline hard nodule at nape of neck  Resp: breathing comfortably on room air, speaking in full sentences Skin: warm, dry Extremities: warm and well perfused MSK: gait antalgic requiring walker Neuro: Alert and oriented, speech normal  Assessment & Plan:   Lump on neck Established problem with unclear etiology with unknown prognosis. Thyroid work up with known thyromegaly. This appears discrete from thyroid. CXR without abnormalities. Feels like bony enlargemetn of  sternoclavicular joint however given acute onset and possibility of enlargement will continue to work this up. Discussed case with radiology who recommended CT soft tissue of neck without contrast. Order placed. Will follow up pending results.  Thyromegaly Enlarged on exam with apparent enlargement of neck. Does endorse some symptoms when swallowing. Thyroid ultrasound notable for mildly heterogenous and interval enlargement since 2014. Pseudonodule in isthmus. Last TSH in 2020 WNL, Last T4 in 2019 WNL. - obtain TSH and TPO antibodies - Likely benefit from endocrine referral for continued monitoring however will discussed pending labs  Orders Placed This Encounter  Procedures  . CT SOFT TISSUE NECK WO CONTRAST    Attention at right sternoclavicular joint    Standing Status:   Future    Standing Expiration Date:   07/02/2021    Order Specific Question:   Preferred imaging location?    Answer:   [facility]  . TSH  . Thyroid Peroxidase Antibody   No orders of the defined types were placed in this encounter.  Mina Marble, DO PGY-3, [facility] 07/02/2020 12:08 PM

## 2020-07-02 NOTE — Assessment & Plan Note (Addendum)
Established problem with unclear etiology with unknown prognosis. Thyroid work up with known thyromegaly. This appears discrete from thyroid. CXR without abnormalities. Feels like bony enlargemetn of sternoclavicular joint however given acute onset and possibility of enlargement will continue to work this up. Discussed case with radiology who recommended CT soft tissue of neck without contrast. Order placed. Will follow up pending results.

## 2020-07-02 NOTE — Assessment & Plan Note (Addendum)
Enlarged on exam with apparent enlargement of neck. Does endorse some symptoms when swallowing. Thyroid ultrasound notable for mildly heterogenous and interval enlargement since 2014. Pseudonodule in isthmus. Last TSH in 2020 WNL, Last T4 in 2019 WNL. - obtain TSH and TPO antibodies - Likely benefit from endocrine referral for continued monitoring however will discussed pending labs

## 2020-07-03 LAB — THYROID PEROXIDASE ANTIBODY: Thyroperoxidase Ab SerPl-aCnc: 351 IU/mL — ABNORMAL HIGH (ref 0–34)

## 2020-07-03 LAB — TSH: TSH: 2.72 u[IU]/mL (ref 0.450–4.500)

## 2020-07-24 ENCOUNTER — Ambulatory Visit
Admission: RE | Admit: 2020-07-24 | Discharge: 2020-07-24 | Disposition: A | Discharge status: Home / Self Care | Payer: Medicare Other | Source: Ambulatory Visit | Attending: Family Medicine | Admitting: Family Medicine

## 2020-07-24 DIAGNOSIS — R221 Localized swelling, mass and lump, neck: Secondary | ICD-10-CM

## 2020-07-24 MED ORDER — IOPAMIDOL (ISOVUE-300) INJECTION 61%
75.0000 mL | Freq: Once | INTRAVENOUS | Status: AC | PRN
  Administered 2020-07-24: 75 mL via INTRAVENOUS

## 2020-07-25 ENCOUNTER — Other Ambulatory Visit: Payer: Self-pay | Admitting: Family Medicine

## 2020-08-01 ENCOUNTER — Other Ambulatory Visit: Payer: Self-pay | Admitting: Family Medicine

## 2020-08-01 DIAGNOSIS — R7989 Other specified abnormal findings of blood chemistry: Secondary | ICD-10-CM

## 2020-08-01 DIAGNOSIS — E010 Iodine-deficiency related diffuse (endemic) goiter: Secondary | ICD-10-CM

## 2020-08-14 ENCOUNTER — Other Ambulatory Visit: Payer: Self-pay | Admitting: Family Medicine

## 2020-08-14 DIAGNOSIS — G894 Chronic pain syndrome: Secondary | ICD-10-CM

## 2020-09-04 ENCOUNTER — Other Ambulatory Visit: Payer: Self-pay | Admitting: Family Medicine

## 2020-09-04 DIAGNOSIS — H8110 Benign paroxysmal vertigo, unspecified ear: Secondary | ICD-10-CM

## 2020-09-10 ENCOUNTER — Other Ambulatory Visit: Payer: Self-pay | Admitting: Family Medicine

## 2020-09-10 ENCOUNTER — Encounter: Payer: Self-pay | Admitting: Family Medicine

## 2020-09-10 DIAGNOSIS — K219 Gastro-esophageal reflux disease without esophagitis: Secondary | ICD-10-CM

## 2020-09-10 DIAGNOSIS — H8110 Benign paroxysmal vertigo, unspecified ear: Secondary | ICD-10-CM

## 2020-09-10 DIAGNOSIS — R112 Nausea with vomiting, unspecified: Secondary | ICD-10-CM

## 2020-09-10 MED ORDER — ALBUTEROL SULFATE HFA 108 (90 BASE) MCG/ACT IN AERS: 1.0000 | INHALATION_SPRAY | Freq: Four times a day (QID) | RESPIRATORY_TRACT | 2 refills | Status: DC | PRN

## 2020-09-18 ENCOUNTER — Other Ambulatory Visit: Payer: Self-pay | Admitting: Student

## 2020-09-18 DIAGNOSIS — M79662 Pain in left lower leg: Secondary | ICD-10-CM

## 2020-09-20 ENCOUNTER — Ambulatory Visit: Payer: Medicare Other

## 2020-09-20 ENCOUNTER — Other Ambulatory Visit: Payer: Self-pay

## 2020-09-20 ENCOUNTER — Ambulatory Visit
Admission: RE | Admit: 2020-09-20 | Discharge: 2020-09-20 | Disposition: A | Discharge status: Home / Self Care | Payer: Medicare Other | Source: Ambulatory Visit | Attending: Student | Admitting: Student

## 2020-09-20 DIAGNOSIS — M79662 Pain in left lower leg: Secondary | ICD-10-CM | POA: Insufficient documentation

## 2020-10-05 ENCOUNTER — Other Ambulatory Visit: Payer: Self-pay | Admitting: Family Medicine

## 2020-10-05 DIAGNOSIS — E119 Type 2 diabetes mellitus without complications: Secondary | ICD-10-CM

## 2020-11-12 ENCOUNTER — Other Ambulatory Visit: Payer: Self-pay | Admitting: Family Medicine

## 2020-11-12 DIAGNOSIS — J0100 Acute maxillary sinusitis, unspecified: Secondary | ICD-10-CM

## 2020-11-15 ENCOUNTER — Other Ambulatory Visit: Payer: Self-pay | Admitting: Family Medicine

## 2020-11-15 ENCOUNTER — Telehealth: Payer: Self-pay | Admitting: Podiatry

## 2020-11-15 DIAGNOSIS — H8110 Benign paroxysmal vertigo, unspecified ear: Secondary | ICD-10-CM

## 2020-11-15 DIAGNOSIS — R112 Nausea with vomiting, unspecified: Secondary | ICD-10-CM

## 2020-11-15 NOTE — Telephone Encounter (Signed)
Patient is requesting a refill on Ascend cream that she was given the last time she was here. If patient needs to be seen before Rx can filled, please advise.

## 2020-11-20 NOTE — Telephone Encounter (Signed)
I called patient and informed her she needed an appointment as she hasn't been seen since June of 2021. She will schedule appointment.

## 2020-11-30 ENCOUNTER — Other Ambulatory Visit: Payer: Self-pay

## 2020-11-30 ENCOUNTER — Encounter: Payer: Self-pay | Admitting: Podiatry

## 2020-11-30 ENCOUNTER — Ambulatory Visit (INDEPENDENT_AMBULATORY_CARE_PROVIDER_SITE_OTHER): Payer: Medicare Other | Admitting: Podiatry

## 2020-11-30 DIAGNOSIS — M79676 Pain in unspecified toe(s): Secondary | ICD-10-CM

## 2020-11-30 DIAGNOSIS — B351 Tinea unguium: Secondary | ICD-10-CM | POA: Diagnosis not present

## 2020-11-30 DIAGNOSIS — E1142 Type 2 diabetes mellitus with diabetic polyneuropathy: Secondary | ICD-10-CM

## 2020-11-30 MED ORDER — CAPSAICIN 0.05 % EX CREA
TOPICAL_CREAM | CUTANEOUS | 2 refills | Status: DC
Start: 2020-11-30 — End: 2024-01-18

## 2020-12-06 NOTE — Progress Notes (Signed)
  Subjective:  [name], female    DOB: Jun 04, 1956,  MRN: [id number]  65 y.o. female presents at risk foot care with history of diabetic neuropathy and painful thick toenails that are difficult to trim. Pain interferes with ambulation. Aggravating factors include wearing enclosed shoe gear. Pain is relieved with periodic professional debridement.   Patient is requesting refill of Silvadene Cream which she states helps with the burning pain her feet.  She states her blood glucose was 219 mg/dl yesterday.  Allergies  Allergen Reactions  . Hydrocodone-Acetaminophen Nausea And Vomiting    Review of Systems: Negative except as noted in the HPI.   Objective:   Constitutional Pt is a pleasant 65 y.o. Caucasian female in NAD. AAO x 3.   Vascular Capillary refill time to digits immediate b/l. Capillary refill time to digits <4 seconds b/l lower extremities. Faintly palpable pedal pulses b/l. Pedal hair absent. Lower extremity skin temperature gradient within normal limits. Dependent rubor noted b/l lower extremities.  Neurologic Pt has subjective symptoms of neuropathy. Protective sensation diminished with 10g monofilament b/l.  Dermatologic Pedal skin with normal turgor, texture and tone bilaterally. No open wounds bilaterally. No interdigital macerations bilaterally. Toenails 1-5 b/l elongated, discolored, dystrophic, thickened, crumbly with subungual debris and tenderness to dorsal palpation.  Orthopedic: Normal muscle strength 5/5 to all lower extremity muscle groups bilaterally. No pain crepitus or joint limitation noted with ROM b/l. No gross bony deformities bilaterally.   Radiographs: None Assessment:   1. Diabetic peripheral neuropathy associated with type 2 diabetes mellitus ([place])     Plan:  Patient was evaluated and treated and all questions answered.  Onychomycosis with pain -Nails palliatively debridement as below. -Educated on self-care  Procedure: Nail  Debridement Rationale: Pain Type of Debridement: manual, sharp debridement. Instrumentation: Nail nipper, rotary burr. Number of Nails: 10  -Examined patient. -Patient to continue soft, supportive shoe gear daily. -Toenails 1-5 b/l were debrided in length and girth with sterile nail nippers and dremel without iatrogenic bleeding.  -Patient to report any pedal injuries to medical professional immediately. -Discussed indication for Silvadene Cream. I informed her it is not used for neuropathy pain. Rx sent to her pharmacy for Capsaicin Cream 0.05%  to be applied twice daily. -Patient/POA to call should there be question/concern in the interim.  Return in about 3 months (around 03/02/2021).  Marzetta Board, DPM

## 2020-12-11 ENCOUNTER — Encounter: Payer: Self-pay | Admitting: Family Medicine

## 2020-12-12 ENCOUNTER — Telehealth: Payer: Self-pay | Admitting: Family Medicine

## 2020-12-12 NOTE — Telephone Encounter (Signed)
Called patient to discuss MyChart request. No answer, no option for VM.

## 2021-01-09 ENCOUNTER — Other Ambulatory Visit: Payer: Self-pay | Admitting: Family Medicine

## 2021-01-09 DIAGNOSIS — E119 Type 2 diabetes mellitus without complications: Secondary | ICD-10-CM

## 2021-01-09 NOTE — Telephone Encounter (Signed)
Called and left voicemail for patient to call back and schedule appointment.

## 2021-01-09 NOTE — Telephone Encounter (Signed)
Please schedule patient for diabetes follow up as it has been >6 months since last visit.

## 2021-01-11 ENCOUNTER — Emergency Department
Admission: EM | Admit: 2021-01-11 | Discharge: 2021-01-11 | Disposition: A | Discharge status: Home / Self Care | Payer: Medicare Other | Attending: Emergency Medicine | Admitting: Emergency Medicine

## 2021-01-11 ENCOUNTER — Emergency Department: Payer: Medicare Other

## 2021-01-11 ENCOUNTER — Other Ambulatory Visit: Payer: Self-pay

## 2021-01-11 DIAGNOSIS — Z7982 Long term (current) use of aspirin: Secondary | ICD-10-CM | POA: Insufficient documentation

## 2021-01-11 DIAGNOSIS — E119 Type 2 diabetes mellitus without complications: Secondary | ICD-10-CM | POA: Insufficient documentation

## 2021-01-11 DIAGNOSIS — G309 Alzheimer's disease, unspecified: Secondary | ICD-10-CM | POA: Diagnosis not present

## 2021-01-11 DIAGNOSIS — H1132 Conjunctival hemorrhage, left eye: Secondary | ICD-10-CM | POA: Insufficient documentation

## 2021-01-11 DIAGNOSIS — W06XXXA Fall from bed, initial encounter: Secondary | ICD-10-CM | POA: Insufficient documentation

## 2021-01-11 DIAGNOSIS — I10 Essential (primary) hypertension: Secondary | ICD-10-CM | POA: Insufficient documentation

## 2021-01-11 DIAGNOSIS — Z96652 Presence of left artificial knee joint: Secondary | ICD-10-CM | POA: Diagnosis not present

## 2021-01-11 DIAGNOSIS — S0083XA Contusion of other part of head, initial encounter: Secondary | ICD-10-CM | POA: Insufficient documentation

## 2021-01-11 DIAGNOSIS — Z79899 Other long term (current) drug therapy: Secondary | ICD-10-CM | POA: Diagnosis not present

## 2021-01-11 DIAGNOSIS — F0280 Dementia in other diseases classified elsewhere without behavioral disturbance: Secondary | ICD-10-CM | POA: Diagnosis not present

## 2021-01-11 DIAGNOSIS — Z7951 Long term (current) use of inhaled steroids: Secondary | ICD-10-CM | POA: Diagnosis not present

## 2021-01-11 DIAGNOSIS — W19XXXA Unspecified fall, initial encounter: Secondary | ICD-10-CM

## 2021-01-11 DIAGNOSIS — J449 Chronic obstructive pulmonary disease, unspecified: Secondary | ICD-10-CM | POA: Insufficient documentation

## 2021-01-11 DIAGNOSIS — Z7984 Long term (current) use of oral hypoglycemic drugs: Secondary | ICD-10-CM | POA: Insufficient documentation

## 2021-01-11 DIAGNOSIS — S0993XA Unspecified injury of face, initial encounter: Secondary | ICD-10-CM | POA: Diagnosis present

## 2021-01-11 MED ORDER — ERYTHROMYCIN 5 MG/GM OP OINT: 1.0000 "application " | TOPICAL_OINTMENT | Freq: Three times a day (TID) | OPHTHALMIC | 0 refills | Status: AC

## 2021-01-11 NOTE — ED Provider Notes (Signed)
[facility] Emergency Department Provider Note  ____________________________________________   Event Date/Time   First MD Initiated Contact with Patient 01/11/21 1622     (approximate)  I have reviewed the triage vital signs and the nursing notes.   HISTORY  Chief Complaint Fall    HPI [name] is a 65 y.o. female with past medical history as below here with facial trauma.  The patient states that she fell from her bed earlier this morning.  She believes she rolled off of it and struck the left side of her face on a nearby bed stand.  She reports immediate onset of aching, throbbing pain.  Since then, she has had swelling to the left side of her face as well as bruising which is now extended across her face and down her neck.  She states she had some mild eye pain but has had no ongoing eye pain.  She does not feel like she has any foreign bodies in her eye.  Her vision has been normal.  No double vision.  She does not take blood thinners.  Pain is worse with movement and palpation.  No alleviating factors.    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   Osteoporosis    in lower back per pt    Plantar fasciitis    Snoring 08/28/2017   Stroke ([place]) 15 years ago    residual left sided weakness   Tremors of nervous system    Uterovaginal prolapse, incomplete 06/10/2010   Qualifier: Diagnosis of  By: Zebedee Iba NP, Manuela Schwartz     Vertigo     Patient Active Problem List   Diagnosis Date Noted   Lump on neck 05/19/2020   Subacute maxillary sinusitis 05/19/2020   Dementia ([place]) 03/12/2020   Partial small bowel obstruction  ([place]) 01/30/2020   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   Adenomatous polyp 10/14/2019   Delayed wound healing 09/07/2019   History of lumbar laminectomy for spinal cord decompression 08/26/2019   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   Memory loss 08/10/2017   Urge incontinence 07/25/2016   Impaired functional mobility, balance, and endurance 07/24/2016   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/15/2015   Dyslipidemia associated with type 2 diabetes mellitus ([place]) 04/26/2015   Benign paroxysmal positional vertigo 01/24/2015   Diabetes mellitus type 2, uncomplicated ([place]) 77/41/2878   Vitamin D deficiency 07/11/2013   Mood disorder ([place]) 07/11/2013   Osteopenia 11/04/2012   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   Chronic pain syndrome 04/26/2010   Thyromegaly 09/17/2006   Morbid obesity ([place]) 09/17/2006   GASTROESOPHAGEAL REFLUX, NO ESOPHAGITIS 09/17/2006    Past Surgical History:  Procedure Laterality Date   APPENDECTOMY     BLADDER SURGERY     x2   CATARACT EXTRACTION W/PHACO Left 05/06/2017   Procedure: CATARACT EXTRACTION PHACO AND INTRAOCULAR LENS PLACEMENT ([place]) LEFT DIABETIC;  Surgeon: Leandrew Koyanagi, MD;  Location: [place];  Service: Ophthalmology;  Laterality: Left;  Diabetic - oral meds   CATARACT EXTRACTION W/PHACO  Right 06/03/2017   Procedure: CATARACT EXTRACTION PHACO AND INTRAOCULAR LENS PLACEMENT ([place]) RIGHT DIABETIC;  Surgeon: Leandrew Koyanagi, MD;  Location: [place];  Service: Ophthalmology;  Laterality: Right;  Diabetic - oral meds   CHOLECYSTECTOMY     COLONOSCOPY  03/14/2016   FOOT SURGERY     KYPHOPLASTY N/A 04/07/2019   Procedure: L4 KYPHOPLASTY;  Surgeon: Hessie Knows, MD;  Location: [facility];  Service: Orthopedics;  Laterality: N/A;   TOTAL ABDOMINAL HYSTERECTOMY     TOTAL KNEE ARTHROPLASTY Left 09/21/2018   Procedure: TOTAL KNEE ARTHROPLASTY-LEFT  NEEDS RNFA;  Surgeon: Hessie Knows, MD;  Location: [facility];  Service: Orthopedics;  Laterality: Left;    Prior to Admission medications   Medication Sig Start Date End Date Taking? Authorizing Provider  erythromycin ophthalmic ointment Place 1 application into the left eye 3 (three) times daily for 5 days. 01/11/21 01/16/21 Yes Duffy Bruce, MD  Accu-Chek FastClix Lancets MISC USE TO CHECK BLOOD SUGAR THREE TIMES DAILY 09/22/19   Mullis, Kiersten P, DO  albuterol (PROVENTIL) (2.5 MG/3ML) 0.083% nebulizer solution Take 3 mLs (2.5 mg total) by nebulization every 4 (four) hours as needed for wheezing. 02/17/19   Mullis, Kiersten P, DO  albuterol (VENTOLIN HFA) 108 (90 Base) MCG/ACT inhaler Inhale 1-2 puffs into the lungs every 6 (six) hours as needed for wheezing. 09/10/20   Mullis, Kiersten P, DO  Alcohol Swabs (B-D SINGLE USE SWABS REGULAR) PADS USE  TO  CLEAN  SKIN  BEFORE CHECKING BLOOD SUGAR 09/21/19   Mullis, Kiersten P, DO  aspirin EC 81 MG tablet Take 81 mg by mouth daily.    [provider]  atorvastatin (LIPITOR) 10 MG tablet TAKE 1 TABLET BY MOUTH EVERY DAY 05/14/20   Mullis, Kiersten P, DO  Blood Glucose Monitoring Suppl (ACCU-CHEK GUIDE) w/Device KIT 1 Device by Does not apply route in the morning, at noon, and at bedtime. 09/28/19   Mullis, Kiersten P, DO  Calcium Carbonate-Vitamin D (CALCIUM-D PO) Take 1 tablet by mouth daily.    [provider]  Capsaicin 0.05 % CREA Apply to feet twice daily. 11/30/20   Marzetta Board, DPM  docusate sodium (COLACE) 250 MG capsule Take 250 mg by mouth daily.    [provider]  DULoxetine (CYMBALTA) 60 MG capsule Take 1 capsule (60 mg total) by mouth daily. 04/18/20   Mullis, Kiersten P, DO  fluticasone (FLONASE) 50 MCG/ACT nasal spray SPRAY 2 SPRAYS INTO EACH NOSTRIL EVERY DAY 11/12/20   Mullis, Kiersten P, DO  gabapentin (NEURONTIN) 800 MG tablet Take by mouth. 10/18/20 01/16/21  [provider]  glucose blood  (ACCU-CHEK GUIDE) test strip USE  TO CHECK BLOOD SUGAR THREE TIMES DAILY 10/13/19   Mullis, Kiersten P, DO  glucose blood (ACCU-CHEK GUIDE) test strip 3 (three) times daily 12/02/19   [provider]  ibuprofen (ADVIL) 200 MG tablet Take by mouth.    [provider]  JANUVIA 100 MG tablet TAKE 1 TABLET BY MOUTH EVERY EVENING. 01/09/21   Mullis, Kiersten P, DO  ketoconazole (NIZORAL) 2 % shampoo APPLY TO AFFECTED AREA TOPICALLY 2 (TWO) TIMES A WEEK. 07/25/20   Mullis, Kiersten P, DO  Lancets Misc. (ACCU-CHEK FASTCLIX LANCET) KIT 3 (three) times daily 10/10/19   [provider]  loratadine (CLARITIN) 10 MG tablet Take 10 mg by mouth daily.    [provider]  meclizine (ANTIVERT) 12.5 MG tablet TAKE 1 TABLET TWICE DAILY AS NEEDED FOR DIZZINESS. 09/04/20   Mullis,  Kiersten P, DO  metFORMIN (GLUCOPHAGE) 1000 MG tablet TAKE 1 TABLET (1,000 MG TOTAL) BY MOUTH 2 (TWO) TIMES DAILY WITH A MEAL. 02/28/20   Daisy Floro, DO  methocarbamol (ROBAXIN) 500 MG tablet Take by mouth. 11/19/20   [provider]  Multiple Vitamins-Minerals ([employer] SPECTRAVITE WOMENS SENIOR PO) Take 1 tablet by mouth daily.     [provider]  naloxone [facility]) nasal spray 4 mg/0.1 mL Place into the nose. 10/18/20   [provider]  omeprazole (PRILOSEC) 20 MG capsule TAKE 1 CAPSULE BY MOUTH EVERY DAY IN THE EVENING 09/11/20   Mullis, Kiersten P, DO  ondansetron (ZOFRAN) 4 MG tablet TAKE 1 TABLET BY MOUTH EVERY 8 HOURS AS NEEDED FOR NAUSEA AND VOMITING 11/15/20   Mullis, Kiersten P, DO  oxyCODONE (OXY IR/ROXICODONE) 5 MG immediate release tablet Take 5 mg by mouth every 8 (eight) hours as needed. 11/17/20   [provider]  OZEMPIC, 0.25 OR 0.5 MG/DOSE, 2 MG/1.5ML SOPN INJECT 0.5 MG INTO THE SKIN ONCE A WEEK. 01/09/21   Mullis, Kiersten P, DO  predniSONE (DELTASONE) 20 MG tablet Take 20 mg by mouth daily. 09/17/20   [provider]  triamcinolone (KENALOG) 0.025 %  cream Apply 1 application topically 3 (three) times daily as needed (rash).     [provider]  Wound Dressings ([place] ALGINATE 2"X2") PADS APPLY 1 UNITS TOPICALLY ONCE DAILY 10/10/19   [provider]    Allergies Hydrocodone-acetaminophen  Family History  Problem Relation Age of Onset   Healthy Mother    Heart disease Father    Alcohol abuse Father    Alcohol abuse Brother    Heart disease Sister    Diabetes Sister    Breast cancer Daughter    Colon polyps Neg Hx    Colon cancer Neg Hx    Esophageal cancer Neg Hx    Rectal cancer Neg Hx    Stomach cancer Neg Hx     Social History Social History   Tobacco Use   Smoking status: Never   Smokeless tobacco: Never  Vaping Use   Vaping Use: Never used  Substance Use Topics   Alcohol use: No   Drug use: No    Review of Systems  Review of Systems  Constitutional:  Positive for fatigue. Negative for fever.  HENT:  Positive for facial swelling. Negative for congestion and sore throat.   Eyes:  Negative for visual disturbance.  Respiratory:  Negative for cough and shortness of breath.   Cardiovascular:  Negative for chest pain.  Gastrointestinal:  Negative for abdominal pain, diarrhea, nausea and vomiting.  Genitourinary:  Negative for flank pain.  Musculoskeletal:  Negative for back pain and neck pain.  Skin:  Negative for rash and wound.  Neurological:  Positive for headaches. Negative for weakness.  All other systems reviewed and are negative.   ____________________________________________  PHYSICAL EXAM:      VITAL SIGNS: ED Triage Vitals [01/11/21 1417]  Enc Vitals Group     BP (!) 152/77     Pulse Rate 91     Resp 20     Temp 97.7 F (36.5 C)     Temp Source Oral     SpO2 96 %     Weight 240 lb (108.9 kg)     Height '5\' 5"'  (1.651 m)     Head Circumference      Peak Flow      Pain Score 6  Pain Loc      Pain Edu?      Excl. in [place]?      Physical Exam Vitals and nursing  note reviewed.  Constitutional:      General: She is not in acute distress.    Appearance: She is well-developed.  HENT:     Head: Normocephalic and atraumatic.     Comments: Diffuse bruising and ecchymoses noted to the left face, extending down to the upper neck.  She has no hemotympanum.  Approximately 35% subconjunctival hemorrhage along the left outer lower aspect of the conjunctive a, but globe appears intact with painless, full extraocular movements.  Pupils equal and reactive.  Vision is intact. Eyes:     Conjunctiva/sclera: Conjunctivae normal.  Neck:     Comments: No midline or paraspinal tenderness. Cardiovascular:     Rate and Rhythm: Normal rate and regular rhythm.     Heart sounds: Normal heart sounds. No murmur heard.   No friction rub.  Pulmonary:     Effort: Pulmonary effort is normal. No respiratory distress.     Breath sounds: Normal breath sounds. No wheezing or rales.  Abdominal:     General: There is no distension.     Palpations: Abdomen is soft.     Tenderness: There is no abdominal tenderness.  Musculoskeletal:     Cervical back: Neck supple.  Skin:    General: Skin is warm.     Capillary Refill: Capillary refill takes less than 2 seconds.  Neurological:     Mental Status: She is alert and oriented to person, place, and time.     Motor: No abnormal muscle tone.      ____________________________________________   LABS (all labs ordered are listed, but only abnormal results are displayed)  Labs Reviewed - No data to display  ____________________________________________  EKG:  ________________________________________  RADIOLOGY All imaging, including plain films, CT scans, and ultrasounds, independently reviewed by me, and interpretations confirmed via formal radiology reads.  ED MD interpretation:   X-ray knee left: No acute finding CT head, C-spine, face.  No skull fracture.  Periorbital and facial bruising without maxillofacial fracture.  No  cervical spine fracture.  Official radiology report(s): DG Knee 2 Views Left  Result Date: 01/11/2021 CLINICAL DATA:  Fall, LEFT knee pain post fall. EXAM: LEFT KNEE - 1-2 VIEW COMPARISON:  October 18, 2018. FINDINGS: Osteopenia. Post LEFT total knee arthroplasty. No sign of fracture or dislocation. No joint effusion. IMPRESSION: Post LEFT total knee arthroplasty. No acute finding. Electronically Signed   By: Zetta Bills M.D.   On: 01/11/2021 15:06   CT Head Wo Contrast  Result Date: 01/11/2021 CLINICAL DATA:  Large amount of left periorbital bruising after falling and hitting her head today. Midline neck tenderness. EXAM: CT HEAD WITHOUT CONTRAST CT MAXILLOFACIAL WITHOUT CONTRAST CT CERVICAL SPINE WITHOUT CONTRAST TECHNIQUE: Multidetector CT imaging of the head, cervical spine, and maxillofacial structures were performed using the standard protocol without intravenous contrast. Multiplanar CT image reconstructions of the cervical spine and maxillofacial structures were also generated. COMPARISON:  Neck CT dated 07/24/2020.  Head CT dated 09/24/2018. FINDINGS: CT HEAD FINDINGS Brain: Normal appearing cerebral hemispheres and posterior fossa structures. Normal size and position of the ventricles. No intracranial hemorrhage, mass lesion or CT evidence of acute infarction. Vascular: No hyperdense vessel or unexpected calcification. Skull: Normal. Negative for fracture or focal lesion. Other: None. CT MAXILLOFACIAL FINDINGS Osseous: Again demonstrated is a defect in the medial wall of the left maxillary  sinus no fracture or dislocation seen. Multiple missing upper and lower teeth with cavities in the remaining teeth. Orbits: Status post bilateral cataract extraction. Sinuses: Again demonstrated is soft tissue density filling the left maxillary sinus and extending into the adjacent left nasal cavity through the defect in the medial wall of the sinus. Mild left anterior ethmoid sinus mucosal thickening. Soft  tissues: Left periorbital soft tissue swelling and left anterolateral hematoma in the subcutaneous fat of the face at the level of the maxillary sinus. The hematoma measures 3.3 x 1.6 cm. Diffuse left facial subcutaneous edema. CT CERVICAL SPINE FINDINGS Alignment: Normal. Skull base and vertebrae: No acute fracture. No primary bone lesion or focal pathologic process. Soft tissues and spinal canal: No prevertebral fluid or swelling. No visible canal hematoma. Disc levels: Mild fragmented anterior spur formation at the C5-6 level without significant change. Mild anterior spur formation at the T1-2 level without significant change. Upper chest: Clear lung apices. Other: Stable diffuse enlargement of the thyroid gland, previously evaluated with ultrasound. IMPRESSION: 1. No skull fracture or intracranial hemorrhage. 2. Left periorbital and facial bruising and subcutaneous hematoma without maxillofacial fracture. 3. No cervical spine fracture or subluxation. 4. Stable left sinonasal polyp. 5. Stable diffuse thyroid goiter. 6. Multiple missing and upper teeth bilaterally with cavities in the remaining teeth. Electronically Signed   By: Claudie Revering M.D.   On: 01/11/2021 15:36   CT Cervical Spine Wo Contrast  Result Date: 01/11/2021 CLINICAL DATA:  Large amount of left periorbital bruising after falling and hitting her head today. Midline neck tenderness. EXAM: CT HEAD WITHOUT CONTRAST CT MAXILLOFACIAL WITHOUT CONTRAST CT CERVICAL SPINE WITHOUT CONTRAST TECHNIQUE: Multidetector CT imaging of the head, cervical spine, and maxillofacial structures were performed using the standard protocol without intravenous contrast. Multiplanar CT image reconstructions of the cervical spine and maxillofacial structures were also generated. COMPARISON:  Neck CT dated 07/24/2020.  Head CT dated 09/24/2018. FINDINGS: CT HEAD FINDINGS Brain: Normal appearing cerebral hemispheres and posterior fossa structures. Normal size and position of  the ventricles. No intracranial hemorrhage, mass lesion or CT evidence of acute infarction. Vascular: No hyperdense vessel or unexpected calcification. Skull: Normal. Negative for fracture or focal lesion. Other: None. CT MAXILLOFACIAL FINDINGS Osseous: Again demonstrated is a defect in the medial wall of the left maxillary sinus no fracture or dislocation seen. Multiple missing upper and lower teeth with cavities in the remaining teeth. Orbits: Status post bilateral cataract extraction. Sinuses: Again demonstrated is soft tissue density filling the left maxillary sinus and extending into the adjacent left nasal cavity through the defect in the medial wall of the sinus. Mild left anterior ethmoid sinus mucosal thickening. Soft tissues: Left periorbital soft tissue swelling and left anterolateral hematoma in the subcutaneous fat of the face at the level of the maxillary sinus. The hematoma measures 3.3 x 1.6 cm. Diffuse left facial subcutaneous edema. CT CERVICAL SPINE FINDINGS Alignment: Normal. Skull base and vertebrae: No acute fracture. No primary bone lesion or focal pathologic process. Soft tissues and spinal canal: No prevertebral fluid or swelling. No visible canal hematoma. Disc levels: Mild fragmented anterior spur formation at the C5-6 level without significant change. Mild anterior spur formation at the T1-2 level without significant change. Upper chest: Clear lung apices. Other: Stable diffuse enlargement of the thyroid gland, previously evaluated with ultrasound. IMPRESSION: 1. No skull fracture or intracranial hemorrhage. 2. Left periorbital and facial bruising and subcutaneous hematoma without maxillofacial fracture. 3. No cervical spine fracture or subluxation. 4. Stable  left sinonasal polyp. 5. Stable diffuse thyroid goiter. 6. Multiple missing and upper teeth bilaterally with cavities in the remaining teeth. Electronically Signed   By: Claudie Revering M.D.   On: 01/11/2021 15:36   CT Maxillofacial  Wo Contrast  Result Date: 01/11/2021 CLINICAL DATA:  Large amount of left periorbital bruising after falling and hitting her head today. Midline neck tenderness. EXAM: CT HEAD WITHOUT CONTRAST CT MAXILLOFACIAL WITHOUT CONTRAST CT CERVICAL SPINE WITHOUT CONTRAST TECHNIQUE: Multidetector CT imaging of the head, cervical spine, and maxillofacial structures were performed using the standard protocol without intravenous contrast. Multiplanar CT image reconstructions of the cervical spine and maxillofacial structures were also generated. COMPARISON:  Neck CT dated 07/24/2020.  Head CT dated 09/24/2018. FINDINGS: CT HEAD FINDINGS Brain: Normal appearing cerebral hemispheres and posterior fossa structures. Normal size and position of the ventricles. No intracranial hemorrhage, mass lesion or CT evidence of acute infarction. Vascular: No hyperdense vessel or unexpected calcification. Skull: Normal. Negative for fracture or focal lesion. Other: None. CT MAXILLOFACIAL FINDINGS Osseous: Again demonstrated is a defect in the medial wall of the left maxillary sinus no fracture or dislocation seen. Multiple missing upper and lower teeth with cavities in the remaining teeth. Orbits: Status post bilateral cataract extraction. Sinuses: Again demonstrated is soft tissue density filling the left maxillary sinus and extending into the adjacent left nasal cavity through the defect in the medial wall of the sinus. Mild left anterior ethmoid sinus mucosal thickening. Soft tissues: Left periorbital soft tissue swelling and left anterolateral hematoma in the subcutaneous fat of the face at the level of the maxillary sinus. The hematoma measures 3.3 x 1.6 cm. Diffuse left facial subcutaneous edema. CT CERVICAL SPINE FINDINGS Alignment: Normal. Skull base and vertebrae: No acute fracture. No primary bone lesion or focal pathologic process. Soft tissues and spinal canal: No prevertebral fluid or swelling. No visible canal hematoma. Disc  levels: Mild fragmented anterior spur formation at the C5-6 level without significant change. Mild anterior spur formation at the T1-2 level without significant change. Upper chest: Clear lung apices. Other: Stable diffuse enlargement of the thyroid gland, previously evaluated with ultrasound. IMPRESSION: 1. No skull fracture or intracranial hemorrhage. 2. Left periorbital and facial bruising and subcutaneous hematoma without maxillofacial fracture. 3. No cervical spine fracture or subluxation. 4. Stable left sinonasal polyp. 5. Stable diffuse thyroid goiter. 6. Multiple missing and upper teeth bilaterally with cavities in the remaining teeth. Electronically Signed   By: Claudie Revering M.D.   On: 01/11/2021 15:36    ____________________________________________  PROCEDURES   Procedure(s) performed (including Critical Care):  Procedures  ____________________________________________  INITIAL IMPRESSION / MDM / [place] / ED COURSE  As part of my medical decision making, I reviewed the following data within the [place] notes reviewed and incorporated, Old chart reviewed, Notes from prior ED visits, and [place] Controlled Substance Database       *[name] was evaluated in Emergency Department on 01/11/2021 for the symptoms described in the history of present illness. She was evaluated in the context of the global COVID-19 pandemic, which necessitated consideration that the patient might be at risk for infection with the SARS-CoV-2 virus that causes COVID-19. Institutional protocols and algorithms that pertain to the evaluation of patients at risk for COVID-19 are in a state of rapid change based on information released by regulatory bodies including the CDC and federal and state organizations. These policies and algorithms were followed during the patient's care in  the ED.  Some ED evaluations and interventions may be delayed as a result of limited staffing during  the pandemic.*     Medical Decision Making: 65 year old female here with ecchymoses to the left face after mechanical fall.  No evidence of fracture or significant injury on CT, which was independently reviewed by me.  She had some mild knee pain which was reviewed and is negative.  She has not blood thinners.  She is alert and oriented.  She does have a subconjunctival hematoma on the left eye, but globe is intact, extraocular movements intact, no signs of corneal abrasion or laceration.  Vision is at baseline.  Will treat with Romycin ointment, discharged with good return precautions.  ____________________________________________  FINAL CLINICAL IMPRESSION(S) / ED DIAGNOSES  Final diagnoses:  Fall, initial encounter  Facial hematoma, initial encounter  Subconjunctival hemorrhage of left eye     MEDICATIONS GIVEN DURING THIS VISIT:  Medications - No data to display   ED Discharge Orders          Ordered    erythromycin ophthalmic ointment  3 times daily        01/11/21 1707             Note:  This document was prepared using Dragon voice recognition software and may include unintentional dictation errors.   Duffy Bruce, MD 01/11/21 Dorthula Perfect

## 2021-01-11 NOTE — ED Triage Notes (Signed)
Pt here with a fall today. Pt states she fell off the bed and hit her head. Pt noted to have a large amount of bruising around her left eye. Pt states she was asleep and then woke up when she hit the floor.

## 2021-02-07 ENCOUNTER — Other Ambulatory Visit: Payer: Self-pay | Admitting: Family Medicine

## 2021-02-07 DIAGNOSIS — H8110 Benign paroxysmal vertigo, unspecified ear: Secondary | ICD-10-CM

## 2021-02-12 ENCOUNTER — Ambulatory Visit: Payer: Medicare Other | Admitting: Family Medicine

## 2021-02-13 ENCOUNTER — Other Ambulatory Visit: Payer: Self-pay | Admitting: Family Medicine

## 2021-02-13 DIAGNOSIS — E119 Type 2 diabetes mellitus without complications: Secondary | ICD-10-CM

## 2021-02-25 ENCOUNTER — Encounter: Payer: Self-pay | Admitting: Family Medicine

## 2021-02-25 ENCOUNTER — Other Ambulatory Visit: Payer: Self-pay

## 2021-02-25 ENCOUNTER — Ambulatory Visit (INDEPENDENT_AMBULATORY_CARE_PROVIDER_SITE_OTHER): Payer: Medicare Other | Admitting: Family Medicine

## 2021-02-25 VITALS — BP 128/56 | HR 95 | Ht 64.0 in | Wt 228.8 lb

## 2021-02-25 DIAGNOSIS — Z79899 Other long term (current) drug therapy: Secondary | ICD-10-CM

## 2021-02-25 DIAGNOSIS — E1140 Type 2 diabetes mellitus with diabetic neuropathy, unspecified: Secondary | ICD-10-CM

## 2021-02-25 DIAGNOSIS — E119 Type 2 diabetes mellitus without complications: Secondary | ICD-10-CM | POA: Diagnosis not present

## 2021-02-25 DIAGNOSIS — H8110 Benign paroxysmal vertigo, unspecified ear: Secondary | ICD-10-CM | POA: Diagnosis not present

## 2021-02-25 LAB — POCT UA - MICROALBUMIN
Albumin/Creatinine Ratio, Urine, POC: <30
Creatinine, POC: 200 mg/dL
Microalbumin Ur, POC: 80 mg/L

## 2021-02-25 LAB — POCT GLYCOSYLATED HEMOGLOBIN (HGB A1C): HbA1c, POC (controlled diabetic range): 8.5 % — AB (ref 0.0–7.0)

## 2021-02-25 NOTE — Assessment & Plan Note (Signed)
On multiple drugs that increase fall risk.  Most are Rxed by pain management.  Biggest concerns are her narcotics, gabapentin, methocarbimol and meclizine.  See AVS.  Asked to discuss with pain management.

## 2021-02-25 NOTE — Assessment & Plan Note (Signed)
Poor control.  Return to work with Dr. Georgina Peer to improve control. Also trial of capsecum then lidocaine for neuropathy.

## 2021-02-25 NOTE — Patient Instructions (Addendum)
I want you to see Dr. Hughes Better to work on your diabetes.  Please make an appointment with her on the way out. Three medicines that you take the put you at increased fall risk are: Gabapentin - for the neuropathy.  Is it really helping? Meclizine - of course, dizziness can also make you fall.  Only take it if really helps the dizziness. Methocarbimol - a muscle relaxer.  The only drug you were on, baclofen is safer at your age.  For the feet, try that capsecum cream on your feet.  If the capsecum doesn't work, try salon pas - lidocaine.

## 2021-02-25 NOTE — Progress Notes (Signed)
    SUBJECTIVE:   CHIEF COMPLAINT / HPI:   Multiple issues. Fall 6 weeks ago.  "I was sleeping so soundly that I rolled out of bed."  Has had prior falls.  Things contributing to falls: Polypharmacy, on multiple [facility]; diabetic neuropathy; General weakness.  Uses walker for support and balance.   Diabetes, poor control.  A1C has jumped from 6.8 to 8.5.  She was surprised that her last diabetic visit with [place] was 10 months ago.  On multiple meds.  Wants to avoid insulin.  On a positive note, she has had some weight loss. Chronic pain.  Sees pain clinic.  On chronic opioids plus muscle relaxer. Diabetic neuropathy of feet.  Was tried on lyrica, now back on gabapentin.  Also on duloxetine.  C/O severe burning sensation.      OBJECTIVE:   BP (!) 128/56   Pulse 95   Ht '5\' 4"'$  (1.626 m)   Wt 228 lb 12.8 oz (103.8 kg)   SpO2 97%   BMI 39.27 kg/m   Note lowish diastolic pressure.  Confirmed that she is not on any BP meds. Lungs clear Cardiac RRR without m or g Diabetic foot exam done.  No skin breakdown.    ASSESSMENT/PLAN:   Benign paroxysmal positional vertigo Meclizine is an anticholenergic and could contribute to falls (as could vertigo).  Currently taking daily and not waiting for symptoms.  Suggested this is one drug to minimize.   Type 2 diabetes mellitus with diabetic neuropathy, unspecified (HCC) Poor control.  Return to work with Dr. Georgina Peer to improve control. Also trial of capsecum then lidocaine for neuropathy.  Polypharmacy On multiple drugs that increase fall risk.  Most are Rxed by pain management.  Biggest concerns are her narcotics, gabapentin, methocarbimol and meclizine.  See AVS.  Asked to discuss with pain management.     Ellen Resides, MD [place]

## 2021-02-25 NOTE — Assessment & Plan Note (Signed)
Meclizine is an anticholenergic and could contribute to falls (as could vertigo).  Currently taking daily and not waiting for symptoms.  Suggested this is one drug to minimize.

## 2021-03-01 ENCOUNTER — Telehealth: Payer: Self-pay

## 2021-03-01 NOTE — Telephone Encounter (Signed)
LVM to have pt call back to schedule AWV.   RE: confirm insurance and schedule AWV on my schedule if times are convenient for patient or other AWV schedule as template permits.   

## 2021-03-02 ENCOUNTER — Other Ambulatory Visit: Payer: Self-pay | Admitting: Family Medicine

## 2021-03-02 DIAGNOSIS — E119 Type 2 diabetes mellitus without complications: Secondary | ICD-10-CM

## 2021-03-05 ENCOUNTER — Other Ambulatory Visit: Payer: Self-pay | Admitting: Family Medicine

## 2021-03-05 DIAGNOSIS — Z1231 Encounter for screening mammogram for malignant neoplasm of breast: Secondary | ICD-10-CM

## 2021-03-08 ENCOUNTER — Other Ambulatory Visit: Payer: Self-pay

## 2021-03-08 ENCOUNTER — Encounter: Payer: Self-pay | Admitting: Podiatry

## 2021-03-08 ENCOUNTER — Ambulatory Visit (INDEPENDENT_AMBULATORY_CARE_PROVIDER_SITE_OTHER): Payer: Medicare Other | Admitting: Podiatry

## 2021-03-08 ENCOUNTER — Ambulatory Visit
Admission: RE | Admit: 2021-03-08 | Discharge: 2021-03-08 | Disposition: A | Discharge status: Home / Self Care | Payer: Medicare Other | Source: Ambulatory Visit | Attending: *Deleted | Admitting: *Deleted

## 2021-03-08 DIAGNOSIS — M79676 Pain in unspecified toe(s): Secondary | ICD-10-CM

## 2021-03-08 DIAGNOSIS — Z1231 Encounter for screening mammogram for malignant neoplasm of breast: Secondary | ICD-10-CM

## 2021-03-08 DIAGNOSIS — B351 Tinea unguium: Secondary | ICD-10-CM | POA: Diagnosis not present

## 2021-03-08 DIAGNOSIS — E1142 Type 2 diabetes mellitus with diabetic polyneuropathy: Secondary | ICD-10-CM

## 2021-03-10 NOTE — Progress Notes (Signed)
Subjective: [name] is a pleasant 65 y.o. female patient seen today for at risk foot care with history of diabetic neuropathy and painful thick toenails that are difficult to trim. Pain interferes with ambulation. Aggravating factors include wearing enclosed shoe gear. Pain is relieved with periodic professional debridement.   She notes no new pedal problems on today's visit. She did have a fall in June and was seen in the ED.  PCP is Maness, Philip, MD.  Allergies  Allergen Reactions   Pregabalin Nausea And Vomiting   Hydrocodone-Acetaminophen Nausea And Vomiting    Objective: Physical Exam  General: [name] is a pleasant 64 y.o. Caucasian female, in NAD. AAO x 3.   Vascular:  Capillary refill time to digits <4 seconds b/l lower extremities. Faintly palpable DP pulse(s) b/l lower extremities. Faintly palpable PT pulse(s) b/l lower extremities. Pedal hair absent. Lower extremity skin temperature gradient within normal limits. Dependent rubor noted b/l lower extremities. No pain with calf compression b/l.  Dermatological:  Skin warm and supple b/l lower extremities. No open wounds b/l lower extremities. Toenails 1-5 b/l elongated, discolored, dystrophic, thickened, crumbly with subungual debris and tenderness to dorsal palpation. No hyperkeratotic nor porokeratotic lesions present on today's visit.  Musculoskeletal:  Normal muscle strength 5/5 to all lower extremity muscle groups bilaterally. No pain crepitus or joint limitation noted with ROM b/l lower extremities. No gross bony deformities b/l lower extremities.  Neurological:  Pt has subjective symptoms of neuropathy. Protective sensation intact 5/5 intact bilaterally with 10g monofilament b/l. Vibratory sensation intact b/l.  Assessment and Plan:  1. Pain due to onychomycosis of toenail   2. Diabetic peripheral neuropathy associated with type 2 diabetes mellitus ([place])   -Examined patient. -Continue diabetic foot  care principles: inspect feet daily, monitor glucose as recommended by PCP and/or Endocrinologist, and follow prescribed diet per PCP, Endocrinologist and/or dietician. -Patient to continue soft, supportive shoe gear daily. -Toenails 1-5 b/l were debrided in length and girth with sterile nail nippers and dremel without iatrogenic bleeding.  -Patient to report any pedal injuries to medical professional immediately. -Patient/POA to call should there be question/concern in the interim.  Return in about 3 months (around 06/08/2021).  Marzetta Board, DPM

## 2021-03-11 ENCOUNTER — Other Ambulatory Visit: Payer: Self-pay | Admitting: Family Medicine

## 2021-03-11 DIAGNOSIS — H8110 Benign paroxysmal vertigo, unspecified ear: Secondary | ICD-10-CM

## 2021-03-12 ENCOUNTER — Other Ambulatory Visit: Payer: Self-pay | Admitting: *Deleted

## 2021-03-12 DIAGNOSIS — R928 Other abnormal and inconclusive findings on diagnostic imaging of breast: Secondary | ICD-10-CM

## 2021-03-18 ENCOUNTER — Ambulatory Visit (INDEPENDENT_AMBULATORY_CARE_PROVIDER_SITE_OTHER): Payer: Medicare Other | Admitting: Pharmacist

## 2021-03-18 ENCOUNTER — Other Ambulatory Visit: Payer: Self-pay

## 2021-03-18 VITALS — Wt 227.4 lb

## 2021-03-18 DIAGNOSIS — E1140 Type 2 diabetes mellitus with diabetic neuropathy, unspecified: Secondary | ICD-10-CM

## 2021-03-18 MED ORDER — OZEMPIC (1 MG/DOSE) 4 MG/3ML ~~LOC~~ SOPN: 1.0000 mg | PEN_INJECTOR | SUBCUTANEOUS | 0 refills | Status: DC

## 2021-03-18 NOTE — Patient Instructions (Signed)
Ms. Eakes it was a pleasure seeing you today.   Please do the following:  Increase your Ozempic to '1mg'$ . You may take two injections of 0.'5mg'$  to make '1mg'$  dose before picking up new pen as directed today during your appointment. If you have any questions or if you believe something has occurred because of this change, call me or your doctor to let one of [place] know.  We are stopping your Januvia. We will likely start a new medication pending the results of your lab. I will call you to let you know. Continue checking blood sugars at home. It's really important that you record these and bring these in to your next doctor's appointment.  Continue making the lifestyle changes we've discussed together during our visit. Diet and exercise play a significant role in improving your blood sugars.  Follow-up with me in one month   Hypoglycemia or low blood sugar:   Low blood sugar can happen quickly and may become an emergency if not treated right away.   While this shouldn't happen often, it can be brought upon if you skip a meal or do not eat enough. Also, if your insulin or other diabetes medications are dosed too high, this can cause your blood sugar to go to low.   Warning signs of low blood sugar include: Feeling shaky or dizzy Feeling weak or tired  Excessive hunger Feeling anxious or upset  Sweating even when you aren't exercising  What to do if I experience low blood sugar? Follow the Rule of 15 Check your blood sugar with your meter. If lower than 70, proceed to step 2.  Treat with 15 grams of fast acting carbs which is found in 3-4 glucose tablets. If none are available you can try hard candy, 1 tablespoon of sugar or honey,4 ounces of fruit juice, or 6 ounces of REGULAR soda.  Re-check your sugar in 15 minutes. If it is still below 70, do what you did in step 2 again. If your blood sugar has come back up, go ahead and eat a snack or small meal made up of complex carbs (ex. Whole grains) and  protein at this time to avoid recurrence of low blood sugar.

## 2021-03-18 NOTE — Progress Notes (Signed)
Subjective:    [name], female    DOB: 03-22-1956, 65 y.o.   MRN: [id number]  HPI Patient is a 65 y.o. female who presents for diabetes management. She is in good spirits and presents with assistance. Patient was referred and last seen by provider, Dr. Andria Frames, on 02/25/21.  Patient reports diabetes was diagnosed around 34 years ago.   Insurance coverage/medication affordability: [employer] Medicare/Medicaid  Current diabetes medications include: Januvia '100mg'$ , metformin '1000mg'$  BID, Ozempic 0.'5mg'$  once weekly on Thursdays Current hypertension medications include: N/a Current hyperlipidemia medications include: atorvastatin '10mg'$  Patient states that She is taking her medications as prescribed. Patient reports adherence with medications.  Do you feel that your medications are working for you?  "Sometimes I do sometimes I don't"  Have you been experiencing any side effects to the medications prescribed? no  Do you have any problems obtaining medications due to transportation or finances?  no      Patient denies hypoglycemic events. Patient reports polyuria (increased urination).  Patient reports polyphagia (increased appetite).  Patient reports polydipsia (increased thirst).  Patient reports neuropathy (nerve pain). Patient denies visual changes. Patient denies self foot exams.   Home fasting blood sugars:  200's 2 hour post-meal/random blood sugars: 153, 226; mostly in 200's  Objective:   Labs:   Physical Exam Neurological:     Mental Status: She is alert and oriented to person, place, and time.    Review of Systems  Cardiovascular:  Positive for leg swelling.   Lab Results  Component Value Date   HGBA1C 8.5 (A) 02/25/2021   HGBA1C 6.8 (A) 05/18/2020   HGBA1C 7.2 (H) 01/31/2020   Last Weight  Most recent update: 03/18/2021  1:45 PM    Weight  103.1 kg (227 lb 6.4 oz)              Lab Results  Component Value Date   MICRALBCREAT <30 02/25/2021     Lipid Panel     Component Value Date/Time   CHOL 109 12/21/2019 1506   TRIG 116 12/21/2019 1506   HDL 44 12/21/2019 1506   CHOLHDL 2.5 12/21/2019 1506   CHOLHDL 6.9 (H) 04/26/2015 0918   VLDL 62 (H) 04/26/2015 0918   LDLCALC 44 12/21/2019 1506   LDLDIRECT 103 (H) 08/10/2009 2233    Clinical Atherosclerotic Cardiovascular Disease (ASCVD): No  The ASCVD Risk score Mikey Bussing DC Jr., et al., 2013) failed to calculate for the following reasons:   The valid total cholesterol range is 130 to 320 mg/dL   Assessment/Plan:   T2DM is not controlled likely due to needing further adjustments to therapy. Medication adherence appears optimal. Additional pharmacotherapy is warranted. Will discontinue Januvia as patient likely not experiencing much benefit due to also being on Ozempic. Will titrate Ozempic to '1mg'$  once weekly. Obtain BMP today to check kidney function and if appropriate will start SGLT2. Patient educated on purpose, proper use and potential adverse effects of SGLT2.  Following instruction patient verbalized understanding of treatment plan.    Increased dose of GLP-1 Ozempic to '1mg'$  once weekly on Thursdays. Continued metformin '1000mg'$  BID Discontinued Januvia '100mg'$  Extensively discussed pathophysiology of diabetes, dietary effects on blood sugar control, and recommended lifestyle interventions. Counseled on s/sx of and management of hypoglycemia Next A1C anticipated November 2022.   Follow-up appointment one month to review sugar readings. Written patient instructions provided.  This appointment required 30 minutes of direct patient care.  Thank you for involving pharmacy to assist in providing  this patient's care.

## 2021-03-18 NOTE — Assessment & Plan Note (Signed)
T2DM is not controlled likely due to needing further adjustments to therapy. Medication adherence appears optimal. Additional pharmacotherapy is warranted. Will discontinue Januvia as patient likely not experiencing much benefit due to also being on Ozempic. Will titrate Ozempic to '1mg'$  once weekly. Obtain BMP today to check kidney function and if appropriate will start SGLT2. Patient educated on purpose, proper use and potential adverse effects of SGLT2.  Following instruction patient verbalized understanding of treatment plan.    1. Increased dose of GLP-1 Ozempic to '1mg'$  once weekly on Thursdays. 2. Continued metformin '1000mg'$  BID 3. Discontinued Januvia '100mg'$  4. Extensively discussed pathophysiology of diabetes, dietary effects on blood sugar control, and recommended lifestyle interventions. 5. Counseled on s/sx of and management of hypoglycemia 6. Next A1C anticipated November 2022.   Follow-up appointment one month to review sugar readings. Written patient instructions provided.

## 2021-03-19 LAB — BASIC METABOLIC PANEL
BUN/Creatinine Ratio: 22 (ref 12–28)
BUN: 11 mg/dL (ref 8–27)
CO2: 26 mmol/L (ref 20–29)
Calcium: 9.4 mg/dL (ref 8.7–10.3)
Chloride: 102 mmol/L (ref 96–106)
Creatinine, Ser: 0.51 mg/dL — ABNORMAL LOW (ref 0.57–1.00)
Glucose: 202 mg/dL — ABNORMAL HIGH (ref 65–99)
Potassium: 4.4 mmol/L (ref 3.5–5.2)
Sodium: 138 mmol/L (ref 134–144)
eGFR: 104 mL/min/{1.73_m2} (ref >59)

## 2021-03-22 ENCOUNTER — Telehealth: Payer: Self-pay | Admitting: Pharmacist

## 2021-03-22 MED ORDER — EMPAGLIFLOZIN 10 MG PO TABS: 10.0000 mg | ORAL_TABLET | Freq: Every day | ORAL | 0 refills | Status: DC

## 2021-03-22 MED ORDER — EMPAGLIFLOZIN 10 MG PO TABS: 10.0000 mg | ORAL_TABLET | Freq: Every day | ORAL | 3 refills | Status: DC

## 2021-03-22 NOTE — Addendum Note (Signed)
Addended by: Hughes Better on: 03/22/2021 04:00 PM   Modules accepted: Orders

## 2021-03-22 NOTE — Telephone Encounter (Signed)
Left voicemail for patient to go over lab results.   Based on lab results will plan on adding SGLT2 but will wait to discuss with patient upon return phone call.

## 2021-03-22 NOTE — Telephone Encounter (Signed)
Patient returned my call. Discussed lab results  Will send in for Jardiance '10mg'$ . Patient educated on purpose, proper use and potential adverse effects of Jardiance.  Following instruction patient verbalized understanding of treatment plan.    Follow-up scheduled in clinic in one month

## 2021-03-22 NOTE — Telephone Encounter (Signed)
-----   Message from Zenia Resides, MD sent at 03/19/2021 11:55 AM EDT ----- Ernst Bowler, I believe you ordered this yesterday. ----- Message ----- From: Lavone Neri Lab Results In Sent: 03/19/2021   8:13 AM EDT To: Zenia Resides, MD

## 2021-03-26 ENCOUNTER — Other Ambulatory Visit: Payer: Self-pay

## 2021-03-26 ENCOUNTER — Emergency Department
Admission: EM | Admit: 2021-03-26 | Discharge: 2021-03-26 | Disposition: A | Discharge status: Home / Self Care | Payer: Medicare Other | Attending: Emergency Medicine | Admitting: Emergency Medicine

## 2021-03-26 DIAGNOSIS — Z7952 Long term (current) use of systemic steroids: Secondary | ICD-10-CM | POA: Insufficient documentation

## 2021-03-26 DIAGNOSIS — Z96652 Presence of left artificial knee joint: Secondary | ICD-10-CM | POA: Insufficient documentation

## 2021-03-26 DIAGNOSIS — G309 Alzheimer's disease, unspecified: Secondary | ICD-10-CM | POA: Insufficient documentation

## 2021-03-26 DIAGNOSIS — S39012A Strain of muscle, fascia and tendon of lower back, initial encounter: Secondary | ICD-10-CM

## 2021-03-26 DIAGNOSIS — Z7984 Long term (current) use of oral hypoglycemic drugs: Secondary | ICD-10-CM | POA: Diagnosis not present

## 2021-03-26 DIAGNOSIS — Y92410 Unspecified street and highway as the place of occurrence of the external cause: Secondary | ICD-10-CM | POA: Insufficient documentation

## 2021-03-26 DIAGNOSIS — E119 Type 2 diabetes mellitus without complications: Secondary | ICD-10-CM | POA: Insufficient documentation

## 2021-03-26 DIAGNOSIS — F0390 Unspecified dementia without behavioral disturbance: Secondary | ICD-10-CM | POA: Diagnosis not present

## 2021-03-26 DIAGNOSIS — I10 Essential (primary) hypertension: Secondary | ICD-10-CM | POA: Diagnosis not present

## 2021-03-26 DIAGNOSIS — Z79899 Other long term (current) drug therapy: Secondary | ICD-10-CM | POA: Insufficient documentation

## 2021-03-26 DIAGNOSIS — J449 Chronic obstructive pulmonary disease, unspecified: Secondary | ICD-10-CM | POA: Diagnosis not present

## 2021-03-26 DIAGNOSIS — Z7982 Long term (current) use of aspirin: Secondary | ICD-10-CM | POA: Diagnosis not present

## 2021-03-26 DIAGNOSIS — S3992XA Unspecified injury of lower back, initial encounter: Secondary | ICD-10-CM | POA: Diagnosis present

## 2021-03-26 DIAGNOSIS — M542 Cervicalgia: Secondary | ICD-10-CM | POA: Insufficient documentation

## 2021-03-26 MED ORDER — IBUPROFEN 400 MG PO TABS
400.0000 mg | ORAL_TABLET | Freq: Once | ORAL | Status: AC
  Administered 2021-03-26: 400 mg via ORAL
  Filled 2021-03-26: qty 1

## 2021-03-26 MED ORDER — ACETAMINOPHEN 500 MG PO TABS
1000.0000 mg | ORAL_TABLET | Freq: Once | ORAL | Status: AC
  Administered 2021-03-26: 1000 mg via ORAL
  Filled 2021-03-26: qty 2

## 2021-03-26 MED ORDER — IBUPROFEN 400 MG PO TABS: 400.0000 mg | ORAL_TABLET | Freq: Three times a day (TID) | ORAL | 0 refills | Status: AC | PRN
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
## 2021-03-29 ENCOUNTER — Other Ambulatory Visit: Payer: Self-pay

## 2021-03-29 DIAGNOSIS — F39 Unspecified mood [affective] disorder: Secondary | ICD-10-CM

## 2021-03-29 DIAGNOSIS — G894 Chronic pain syndrome: Secondary | ICD-10-CM

## 2021-03-29 MED ORDER — DULOXETINE HCL 60 MG PO CPEP: 60.0000 mg | ORAL_CAPSULE | Freq: Every day | ORAL | 3 refills | Status: DC

## 2021-04-03 ENCOUNTER — Other Ambulatory Visit: Payer: Self-pay | Admitting: Family Medicine

## 2021-04-03 DIAGNOSIS — R928 Other abnormal and inconclusive findings on diagnostic imaging of breast: Secondary | ICD-10-CM

## 2021-04-05 ENCOUNTER — Other Ambulatory Visit: Payer: Self-pay | Admitting: Family Medicine

## 2021-04-05 ENCOUNTER — Other Ambulatory Visit: Payer: Self-pay | Admitting: Surgery

## 2021-04-07 ENCOUNTER — Other Ambulatory Visit: Payer: Self-pay | Admitting: Family Medicine

## 2021-04-07 DIAGNOSIS — E119 Type 2 diabetes mellitus without complications: Secondary | ICD-10-CM

## 2021-04-10 ENCOUNTER — Other Ambulatory Visit: Payer: Medicare Other

## 2021-04-10 ENCOUNTER — Other Ambulatory Visit: Payer: Self-pay

## 2021-04-10 DIAGNOSIS — E1169 Type 2 diabetes mellitus with other specified complication: Secondary | ICD-10-CM

## 2021-04-11 MED ORDER — ATORVASTATIN CALCIUM 10 MG PO TABS: 10.0000 mg | ORAL_TABLET | Freq: Every day | ORAL | 3 refills | Status: DC

## 2021-04-15 ENCOUNTER — Ambulatory Visit: Payer: Medicare Other | Admitting: Pharmacist

## 2021-04-16 ENCOUNTER — Ambulatory Visit (INDEPENDENT_AMBULATORY_CARE_PROVIDER_SITE_OTHER): Payer: Medicare Other | Admitting: Pharmacist

## 2021-04-16 ENCOUNTER — Other Ambulatory Visit: Payer: Medicare Other

## 2021-04-16 ENCOUNTER — Other Ambulatory Visit: Payer: Self-pay

## 2021-04-16 DIAGNOSIS — E1140 Type 2 diabetes mellitus with diabetic neuropathy, unspecified: Secondary | ICD-10-CM

## 2021-04-16 DIAGNOSIS — E1169 Type 2 diabetes mellitus with other specified complication: Secondary | ICD-10-CM

## 2021-04-16 MED ORDER — EMPAGLIFLOZIN 25 MG PO TABS: 25.0000 mg | ORAL_TABLET | Freq: Every day | ORAL | 2 refills | Status: DC

## 2021-04-16 NOTE — Patient Instructions (Signed)
Ms. Lizana it was a pleasure seeing you today.   Please do the following:  Take 2 tablets (20 mg) of your Jardiance until it runs out. Then, increase Jardiance to 25 mg every day (we will send a new prescription to your pharmacy) as directed today during your appointment. If you have any questions or if you believe something has occurred because of this change, call me or your doctor to let one of [place] know.  Continue checking blood sugars at home. It's really important that you record these and bring these in to your next doctor's appointment.  Continue making the lifestyle changes we've discussed together during our visit. Diet and exercise play a significant role in improving your blood sugars.  Follow-up with me in 6 weeks.    Hypoglycemia or low blood sugar:   Low blood sugar can happen quickly and may become an emergency if not treated right away.   While this shouldn't happen often, it can be brought upon if you skip a meal or do not eat enough. Also, if your insulin or other diabetes medications are dosed too high, this can cause your blood sugar to go to low.   Warning signs of low blood sugar include: Feeling shaky or dizzy Feeling weak or tired  Excessive hunger Feeling anxious or upset  Sweating even when you aren't exercising  What to do if I experience low blood sugar? Follow the Rule of 15 Check your blood sugar with your meter. If lower than 70, proceed to step 2.  Treat with 15 grams of fast acting carbs which is found in 3-4 glucose tablets. If none are available you can try hard candy, 1 tablespoon of sugar or honey,4 ounces of fruit juice, or 6 ounces of REGULAR soda.  Re-check your sugar in 15 minutes. If it is still below 70, do what you did in step 2 again. If your blood sugar has come back up, go ahead and eat a snack or small meal made up of complex carbs (ex. Whole grains) and protein at this time to avoid recurrence of low blood sugar.

## 2021-04-16 NOTE — Progress Notes (Signed)
Subjective:    [name], female    DOB: March 14, 1956, 65 y.o.   MRN: [id number]  HPI Patient is a 65 y.o. female who presents for diabetes management. She is in good spirits and presents with assistance. Patient was referred and last seen by provider, Dr. Andria Frames, on 02/25/21. Last seen in  pharmacy clinic on 03/18/21.  Patient reports that her right side was hurting and had started hurting while helping her son move. She still feels sore when doing little things, like moving the steering wheel. She feels that her diabetes is going well since starting the Jardiance, saying "this is gonna be the pill." She is checking her blood glucose at home and reports the lowest being 143. She reports that the day before it was in the 150's. The highest number she has seen has been 180's, with most of her BGs being below 200.  Patient reports diabetes was diagnosed around 34 years ago.   Insurance coverage/medication affordability: U[employer] Medicare/Medicaid  Current diabetes medications include: Jardiance 10 mg, metformin 1000mg  BID, Ozempic 0.5mg  once weekly on Thursdays Current hypertension medications include: N/a Current hyperlipidemia medications include: atorvastatin 10mg  Patient states that She is taking her medications as prescribed. Patient reports adherence with medications.  Mostly eats cereal plain or strawberry Special K; strawberry cream no-sugar oatmeal Mostly eats a sandwich, sometimes will skip lunch Depends on what is cooking - sometimes spaghetti or chicken Favorite vegetable: green beans, cucumbers Normally drinks 1 regular Dr Malachi Bonds, will drink water, juice in the AM Snacks sometimes, not every often - peanut butter no sugar   Do you feel that your medications are working for you?  Yes   Have you been experiencing any side effects to the medications prescribed? No  Do you have any problems obtaining medications due to transportation or finances?  No, not right now. Gets  them for free. Says she got a letter saying that it might increase. Will call insurance to figure out what's going    Patient denies hypoglycemic events. Patient denies polyuria (increased urination). - Wakes up 1-4x at night, depends on how close to bedtime she drinks  Patient denies polyphagia (increased appetite). Patient reports polydipsia (increased thirst).  Patient denies neuropathy (nerve pain).  Patient denies visual changes. -- reports seeing eye doctor and everything is going good. Is in the process of getting new glasses  Patient reports self foot exams, gets nails done with PCP q 3 mo  Objective:   Labs:   Physical Exam Neurological:     Mental Status: She is alert and oriented to person, place, and time.     Lab Results  Component Value Date   HGBA1C 8.5 (A) 02/25/2021   HGBA1C 6.8 (A) 05/18/2020   HGBA1C 7.2 (H) 01/31/2020      Lab Results  Component Value Date   MICRALBCREAT <30 02/25/2021    Lipid Panel     Component Value Date/Time   CHOL 109 12/21/2019 1506   TRIG 116 12/21/2019 1506   HDL 44 12/21/2019 1506   CHOLHDL 2.5 12/21/2019 1506   CHOLHDL 6.9 (H) 04/26/2015 0918   VLDL 62 (H) 04/26/2015 0918   LDLCALC 44 12/21/2019 1506   LDLDIRECT 103 (H) 08/10/2009 2233    Clinical Atherosclerotic Cardiovascular Disease (ASCVD): No  The ASCVD Risk score (Arnett DK, et al., 2019) failed to calculate for the following reasons:   The valid total cholesterol range is 130 to 320 mg/dL   Assessment/Plan:  T2DM is not controlled likely due to needing further adjustments to therapy and sub-optimal dietary choices. Medication adherence appears optimal. Patient having no side effects to Jardiance and is pleased by the medication. Will increase to 25 mg daily. Will continue Ozempic 1mg  once weekly. Patient educated on purpose, proper use and potential adverse effects of her medicines.  Following instruction patient verbalized understanding of treatment plan.     Continued GLP-1 Ozempic 1mg  once weekly on Thursdays Increased SGLT2 Jardiance to 25 mg daily - counseled patient to take 2 tablets of her current supply of 10 mg (total of 20 mg daily) until her supply runs out. Then will pick up Jardiance 25 mg from pharmacy.  Continued metformin 1000mg  BID Extensively discussed pathophysiology of diabetes, dietary effects on blood sugar control, and recommended lifestyle interventions. Counseled on s/sx of and management of hypoglycemia Next A1C anticipated November 2022.   Follow-up appointment in 6 weeks to review sugar readings. Written patient instructions provided.  This appointment required 30 minutes of direct patient care.  This patient was seen with Elita Quick, PharmD - PGY1 Pharmacy Resident  Thank you for involving pharmacy to assist in providing this patient's care.

## 2021-04-17 LAB — LIPID PANEL
Chol/HDL Ratio: 2.4 ratio (ref 0.0–4.4)
Cholesterol, Total: 121 mg/dL (ref 100–199)
HDL: 50 mg/dL (ref >39)
LDL Chol Calc (NIH): 55 mg/dL (ref 0–99)
Triglycerides: 81 mg/dL (ref 0–149)
VLDL Cholesterol Cal: 16 mg/dL (ref 5–40)

## 2021-04-18 NOTE — Assessment & Plan Note (Signed)
T2DM is not controlled likely due to needing further adjustments to therapy and sub-optimal dietary choices. Medication adherence appears optimal. Patient having no side effects to Jardiance and is pleased by the medication. Will increase to 25 mg daily. Will continue Ozempic 1mg  once weekly. Patient educated on purpose, proper use and potential adverse effects of her medicines.  Following instruction patient verbalized understanding of treatment plan.    1. Continued GLP-1 Ozempic 1mg  once weekly on Thursdays 2. Increased SGLT2 Jardiance to 25 mg daily - counseled patient to take 2 tablets of her current supply of 10 mg (total of 20 mg daily) until her supply runs out. Then will pick up Jardiance 25 mg from pharmacy.  3. Continued metformin 1000mg  BID 4. Extensively discussed pathophysiology of diabetes, dietary effects on blood sugar control, and recommended lifestyle interventions. 5. Counseled on s/sx of and management of hypoglycemia 6. Next A1C anticipated November 2022.   Follow-up appointment in 6 weeks to review sugar readings. Written patient instructions provided.

## 2021-04-30 ENCOUNTER — Ambulatory Visit
Admission: RE | Admit: 2021-04-30 | Discharge: 2021-04-30 | Disposition: A | Discharge status: Home / Self Care | Payer: Medicare Other | Source: Ambulatory Visit | Attending: Family Medicine | Admitting: Family Medicine

## 2021-04-30 ENCOUNTER — Ambulatory Visit: Payer: Medicare Other

## 2021-04-30 ENCOUNTER — Other Ambulatory Visit: Payer: Self-pay

## 2021-04-30 DIAGNOSIS — R928 Other abnormal and inconclusive findings on diagnostic imaging of breast: Secondary | ICD-10-CM

## 2021-05-06 ENCOUNTER — Other Ambulatory Visit: Payer: Self-pay | Admitting: Family Medicine

## 2021-05-13 ENCOUNTER — Emergency Department: Payer: Medicare Other

## 2021-05-13 ENCOUNTER — Emergency Department
Admission: EM | Admit: 2021-05-13 | Discharge: 2021-05-13 | Disposition: A | Discharge status: Home / Self Care | Payer: Medicare Other | Attending: Emergency Medicine | Admitting: Emergency Medicine

## 2021-05-13 ENCOUNTER — Other Ambulatory Visit: Payer: Self-pay

## 2021-05-13 DIAGNOSIS — W19XXXA Unspecified fall, initial encounter: Secondary | ICD-10-CM | POA: Diagnosis not present

## 2021-05-13 DIAGNOSIS — Z96652 Presence of left artificial knee joint: Secondary | ICD-10-CM | POA: Diagnosis not present

## 2021-05-13 DIAGNOSIS — Z7984 Long term (current) use of oral hypoglycemic drugs: Secondary | ICD-10-CM | POA: Diagnosis not present

## 2021-05-13 DIAGNOSIS — S42292A Other displaced fracture of upper end of left humerus, initial encounter for closed fracture: Secondary | ICD-10-CM

## 2021-05-13 DIAGNOSIS — Z79899 Other long term (current) drug therapy: Secondary | ICD-10-CM | POA: Insufficient documentation

## 2021-05-13 DIAGNOSIS — F0390 Unspecified dementia without behavioral disturbance: Secondary | ICD-10-CM | POA: Diagnosis not present

## 2021-05-13 DIAGNOSIS — E119 Type 2 diabetes mellitus without complications: Secondary | ICD-10-CM | POA: Diagnosis not present

## 2021-05-13 DIAGNOSIS — Z7951 Long term (current) use of inhaled steroids: Secondary | ICD-10-CM | POA: Insufficient documentation

## 2021-05-13 DIAGNOSIS — J449 Chronic obstructive pulmonary disease, unspecified: Secondary | ICD-10-CM | POA: Insufficient documentation

## 2021-05-13 DIAGNOSIS — G309 Alzheimer's disease, unspecified: Secondary | ICD-10-CM | POA: Insufficient documentation

## 2021-05-13 DIAGNOSIS — Z7982 Long term (current) use of aspirin: Secondary | ICD-10-CM | POA: Insufficient documentation

## 2021-05-13 DIAGNOSIS — S4992XA Unspecified injury of left shoulder and upper arm, initial encounter: Secondary | ICD-10-CM | POA: Diagnosis present

## 2021-05-13 DIAGNOSIS — S42202A Unspecified fracture of upper end of left humerus, initial encounter for closed fracture: Secondary | ICD-10-CM | POA: Insufficient documentation

## 2021-05-13 MED ORDER — MORPHINE SULFATE (PF) 4 MG/ML IV SOLN: 4.0000 mg | Freq: Once | INTRAVENOUS | Status: DC

## 2021-05-13 MED ORDER — ONDANSETRON 4 MG PO TBDP: 4.0000 mg | ORAL_TABLET | Freq: Three times a day (TID) | ORAL | 0 refills | Status: DC | PRN

## 2021-05-13 MED ORDER — ONDANSETRON HCL 4 MG/2ML IJ SOLN: 4.0000 mg | Freq: Once | INTRAMUSCULAR | Status: DC

## 2021-05-13 MED ORDER — OXYCODONE-ACETAMINOPHEN 5-325 MG PO TABS
1.0000 | ORAL_TABLET | Freq: Once | ORAL | Status: AC
  Administered 2021-05-13: 1 via ORAL
  Filled 2021-05-13: qty 1

## 2021-05-13 MED ORDER — ONDANSETRON 4 MG PO TBDP
4.0000 mg | ORAL_TABLET | Freq: Once | ORAL | Status: AC
  Administered 2021-05-13: 4 mg via ORAL
  Filled 2021-05-13: qty 1

## 2021-05-13 MED ORDER — OXYCODONE-ACETAMINOPHEN 5-325 MG PO TABS: 1.0000 | ORAL_TABLET | ORAL | 0 refills | Status: DC | PRN

## 2021-05-13 NOTE — ED Provider Notes (Signed)
[facility] Emergency Department Provider Note  ____________________________________________   Event Date/Time   First MD Initiated Contact with Patient 05/13/21 [phone]     (approximate)  I have reviewed the triage vital signs and the nursing notes.   HISTORY  Chief Complaint Fall    HPI BESSIE BOYTE is a 65 y.o. female presents to the emergency department after losing her balance and falling.  Patient fell onto the left shoulder.  Patient does take aspirin per day.  History of diabetes.  No other injuries reported.  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   Osteoporosis    in lower back per pt    Plantar fasciitis    Snoring 08/28/2017   Stroke ([place]) 15 years ago    residual left sided weakness   Tremors of nervous system    Uterovaginal prolapse, incomplete 06/10/2010   Qualifier: Diagnosis of  By: Zebedee Iba NP, Manuela Schwartz     Vertigo     Patient Active Problem List   Diagnosis Date Noted   Polypharmacy 02/25/2021   Lump on neck 05/19/2020   Subacute maxillary sinusitis 05/19/2020   Dementia ([place]) 03/12/2020   Partial small bowel obstruction ([place]) 01/30/2020   COPD with chronic bronchitis ([place]) 01/30/2020   Adenomatous polyp 10/14/2019   Delayed wound healing 09/07/2019   History of lumbar laminectomy for spinal cord decompression 08/26/2019   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   Memory loss 08/10/2017   Urge incontinence 07/25/2016   Impaired functional mobility, balance, and endurance 07/24/2016   Spondylosis of lumbar region without myelopathy or radiculopathy  05/15/2015   Dyslipidemia associated with type 2 diabetes mellitus ([place]) 04/26/2015   Benign paroxysmal positional vertigo 01/24/2015   Type 2 diabetes mellitus with diabetic neuropathy, unspecified ([place]) 04/26/2014   Vitamin D deficiency 07/11/2013   Mood disorder ([place]) 07/11/2013   Osteopenia 11/04/2012   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   Chronic pain syndrome 04/26/2010   Thyromegaly 09/17/2006   Morbid obesity ([place]) 09/17/2006   GASTROESOPHAGEAL REFLUX, NO ESOPHAGITIS 09/17/2006    Past Surgical History:  Procedure Laterality Date   APPENDECTOMY     BLADDER SURGERY     x2   CATARACT EXTRACTION W/PHACO Left 05/06/2017   Procedure: CATARACT EXTRACTION PHACO AND INTRAOCULAR LENS PLACEMENT ([place]) LEFT DIABETIC;  Surgeon: Leandrew Koyanagi, MD;  Location[place];  Service: Ophthalmology;  Laterality: Left;  Diabetic - oral meds   CATARACT EXTRACTION W/PHACO Right 06/03/2017   Procedure: CATARACT EXTRACTION PHACO AND INTRAOCULAR LENS PLACEMENT ([place]) RIGHT DIABETIC;  Surgeon: Leandrew Koyanagi, MD;  Location: [place];  Service: Ophthalmology;  Laterality: Right;  Diabetic - oral meds   CHOLECYSTECTOMY     COLONOSCOPY  03/14/2016   FOOT SURGERY     KYPHOPLASTY N/A 04/07/2019   Procedure: L4 KYPHOPLASTY;  Surgeon: Hessie Knows, MD;  Location: [facility];  Service: Orthopedics;  Laterality: N/A;   TOTAL ABDOMINAL HYSTERECTOMY     TOTAL KNEE ARTHROPLASTY Left 09/21/2018   Procedure: TOTAL KNEE  ARTHROPLASTY-LEFT NEEDS RNFA;  Surgeon: Hessie Knows, MD;  Location: [facility];  Service: Orthopedics;  Laterality: Left;    Prior to Admission medications   Medication Sig Start Date End Date Taking? Authorizing Provider  ondansetron (ZOFRAN-ODT) 4 MG disintegrating tablet Take 1 tablet (4 mg total) by mouth every 8 (eight) hours as needed. 05/13/21  Yes Alex Leahy, Linden Dolin, PA-C  oxyCODONE-acetaminophen (PERCOCET) 5-325 MG tablet Take 1 tablet by  mouth every 4 (four) hours as needed for severe pain. 05/13/21 05/13/22 Yes Safi Culotta, Linden Dolin, PA-C  Accu-Chek FastClix Lancets MISC USE TO CHECK BLOOD SUGAR THREE TIMES DAILY 09/22/19   Mullis, Kiersten P, DO  albuterol (PROVENTIL) (2.5 MG/3ML) 0.083% nebulizer solution Take 3 mLs (2.5 mg total) by nebulization every 4 (four) hours as needed for wheezing. 02/17/19   Mullis, Kiersten P, DO  albuterol (VENTOLIN HFA) 108 (90 Base) MCG/ACT inhaler Inhale 1-2 puffs into the lungs every 6 (six) hours as needed for wheezing. 09/10/20   Mullis, Kiersten P, DO  Alcohol Swabs (B-D SINGLE USE SWABS REGULAR) PADS USE  TO  CLEAN  SKIN  BEFORE CHECKING BLOOD SUGAR 09/21/19   Mullis, Kiersten P, DO  aspirin EC 81 MG tablet Take 81 mg by mouth daily.    [provider]  aspirin-acetaminophen-caffeine (EXCEDRIN MIGRAINE) [phone] MG tablet Take 1 tablet by mouth every 6 (six) hours as needed for headache. Patient not taking: No sig reported    [provider]  atorvastatin (LIPITOR) 10 MG tablet Take 1 tablet (10 mg total) by mouth daily. 04/11/21   Ezequiel Essex, MD  Blood Glucose Monitoring Suppl (ACCU-CHEK GUIDE) w/Device KIT 1 Device by Does not apply route in the morning, at noon, and at bedtime. 09/28/19   Mullis, Kiersten P, DO  Calcium Carbonate-Vitamin D (CALCIUM-D PO) Take 1 tablet by mouth daily.    [provider]  Capsaicin 0.05 % CREA Apply to feet twice daily. 11/30/20   Marzetta Board, DPM  docusate sodium (COLACE) 250 MG capsule Take 250 mg by mouth daily.    [provider]  DULoxetine (CYMBALTA) 60 MG capsule Take 1 capsule (60 mg total) by mouth daily. 03/29/21   Ezequiel Essex, MD  empagliflozin (JARDIANCE) 25 MG TABS tablet Take 1 tablet (25 mg total) by mouth daily. 04/16/21   Zenia Resides, MD  fluticasone (FLONASE) 50 MCG/ACT nasal spray SPRAY 2 SPRAYS INTO EACH NOSTRIL EVERY DAY 11/12/20   Mullis, Kiersten P, DO  gabapentin (NEURONTIN) 800 MG tablet Take  800 mg by mouth 3 (three) times daily. 10/18/20 03/18/21  [provider]  glucose blood (ACCU-CHEK GUIDE) test strip USE  TO CHECK BLOOD SUGAR THREE TIMES DAILY 10/13/19   Mullis, Kiersten P, DO  glucose blood (ACCU-CHEK GUIDE) test strip 3 (three) times daily 12/02/19   [provider]  ibuprofen (ADVIL) 200 MG tablet Take by mouth.    [provider]  ketoconazole (NIZORAL) 2 % shampoo APPLY TO AFFECTED AREA TOPICALLY 2 (TWO) TIMES A WEEK. 07/25/20   Mullis, Kiersten P, DO  loratadine (CLARITIN) 10 MG tablet Take 10 mg by mouth daily.    [provider]  meclizine (ANTIVERT) 12.5 MG tablet TAKE 1 TABLET TWICE DAILY AS NEEDED FOR DIZZINESS. 02/12/21   Dickie La, MD  metFORMIN (GLUCOPHAGE) 1000 MG tablet TAKE 1 TABLET (1,000 MG TOTAL) BY MOUTH 2 (TWO) TIMES DAILY WITH A MEAL. 02/13/21   Maness, Arnette Norris, MD  methocarbamol (ROBAXIN) 500 MG tablet Take 500 mg by  mouth 3 (three) times daily. 11/19/20   [provider]  Multiple Vitamins-Minerals ([employer] SPECTRAVITE WOMENS SENIOR PO) Take 1 tablet by mouth daily.     [provider]  omeprazole (PRILOSEC) 20 MG capsule TAKE 1 CAPSULE BY MOUTH EVERY DAY IN THE EVENING 09/11/20   Mullis, Kiersten P, DO  ondansetron (ZOFRAN) 4 MG tablet TAKE 1 TABLET BY MOUTH EVERY 8 HOURS AS NEEDED FOR NAUSEA AND VOMITING 11/15/20   Mullis, Kiersten P, DO  OZEMPIC, 1 MG/DOSE, 4 MG/3ML SOPN INJECT 1MG INTO THE SKIN ONCE A WEEK 05/07/21   Ezequiel Essex, MD  triamcinolone (KENALOG) 0.025 % cream Apply 1 application topically 3 (three) times daily as needed (rash).     [provider]  Wound Dressings ([place] ALGINATE 2"X2") PADS APPLY 1 UNITS TOPICALLY ONCE DAILY 10/10/19   [provider]    Allergies Pregabalin and Hydrocodone-acetaminophen  Family History  Problem Relation Age of Onset   Healthy Mother    Heart disease Father    Alcohol abuse Father    Alcohol abuse Brother    Heart disease Sister     Diabetes Sister    Breast cancer Daughter    Colon polyps Neg Hx    Colon cancer Neg Hx    Esophageal cancer Neg Hx    Rectal cancer Neg Hx    Stomach cancer Neg Hx     Social History Social History   Tobacco Use   Smoking status: Never   Smokeless tobacco: Never  Vaping Use   Vaping Use: Never used  Substance Use Topics   Alcohol use: No   Drug use: No    Review of Systems  Constitutional: No fever/chills Eyes: No visual changes. ENT: No sore throat. Respiratory: Denies cough Cardiovascular: Denies chest pain Gastrointestinal: Denies abdominal pain Genitourinary: Negative for dysuria. Musculoskeletal: Negative for back pain.  Positive for left shoulder pain Skin: Negative for rash. Psychiatric: no mood changes,     ____________________________________________   PHYSICAL EXAM:  VITAL SIGNS: ED Triage Vitals  Enc Vitals Group     BP 05/13/21 0830 127/60     Pulse Rate 05/13/21 0830 74     Resp 05/13/21 0830 18     Temp 05/13/21 0830 (!) 97.5 F (36.4 C)     Temp Source 05/13/21 0830 Oral     SpO2 05/13/21 0830 97 %     Weight 05/13/21 0836 222 lb 14.2 oz (101.1 kg)     Height 05/13/21 0836 '5\' 5"'  (1.651 m)     Head Circumference --      Peak Flow --      Pain Score 05/13/21 0819 10     Pain Loc --      Pain Edu? --      Excl. in [place]? --     Constitutional: Alert and oriented. Well appearing and in no acute distress. Eyes: Conjunctivae are normal.  Head: Atraumatic. Nose: No congestion/rhinnorhea. Mouth/Throat: Mucous membranes are moist.   Neck:  supple no lymphadenopathy noted Cardiovascular: Normal rate, regular rhythm.  Respiratory: Normal respiratory effort.  No retractions,  GU: deferred Musculoskeletal: Patient is guarding the left shoulder, tender at the joint and proximal humerus, elbow is nontender, wrist is nontender, C-spine is nontender Neurologic:  Normal speech and language.  Skin:  Skin is warm, dry and intact. No rash  noted. Psychiatric: Mood and affect are normal. Speech and behavior are normal.  ____________________________________________   LABS (all labs ordered are listed, but  only abnormal results are displayed)  Labs Reviewed - No data to display ____________________________________________   ____________________________________________  RADIOLOGY  X-ray of the left shoulder, CT of the head  ____________________________________________   PROCEDURES  Procedure(s) performed: Sling applied by nursing staff   Procedures    ____________________________________________   INITIAL IMPRESSION / ASSESSMENT AND PLAN / ED COURSE  Pertinent labs & imaging results that were available during my care of the patient were reviewed by me and considered in my medical decision making (see chart for details).   The patient is a 65 year old female presents emergency department left shoulder pain.  See HPI.  Physical exam shows patient to be tender  DDx: Left shoulder contusion, clavicle fracture, numerous fracture, head injury, SAH, subdural  CT of the head is negative for any acute abnormality  X-ray of the left shoulder reviewed by me confirmed by radiology to have a humeral head fracture with displacement  Consult to orthopedics Dr. Posey Pronto was able to review the x-ray.  States place her in a sling and give her pain medication.  Follow-up in his office in 1 week.  All information was conveyed to the patient.  She is apply ice.  Take Tylenol or ibuprofen prior to taking the narcotic.  Return emergency department for worsening.  She is discharged stable condition with instructions to call [facility] clinic today.  She should expect to be seen in approximately 1 week.  Discharged in stable condition    BRYTANI VOTH was evaluated in Emergency Department on 05/13/2021 for the symptoms described in the history of present illness. She was evaluated in the context of the global COVID-19 pandemic, which  necessitated consideration that the patient might be at risk for infection with the SARS-CoV-2 virus that causes COVID-19. Institutional protocols and algorithms that pertain to the evaluation of patients at risk for COVID-19 are in a state of rapid change based on information released by regulatory bodies including the CDC and federal and state organizations. These policies and algorithms were followed during the patient's care in the ED.    As part of my medical decision making, I reviewed the following data within the [place] notes reviewed and incorporated, Old chart reviewed, Radiograph reviewed , A consult was requested and obtained from this/these consultant(s) Orthopedics, Notes from prior ED visits, and [place] Controlled Substance Database  ____________________________________________   FINAL CLINICAL IMPRESSION(S) / ED DIAGNOSES  Final diagnoses:  Fall, initial encounter  Fracture of humeral head, closed, left, initial encounter      NEW MEDICATIONS STARTED DURING THIS VISIT:  New Prescriptions   ONDANSETRON (ZOFRAN-ODT) 4 MG DISINTEGRATING TABLET    Take 1 tablet (4 mg total) by mouth every 8 (eight) hours as needed.   OXYCODONE-ACETAMINOPHEN (PERCOCET) 5-325 MG TABLET    Take 1 tablet by mouth every 4 (four) hours as needed for severe pain.     Note:  This document was prepared using Dragon voice recognition software and may include unintentional dictation errors.    Versie Starks, PA-C 05/13/21 1046    Lucrezia Starch, MD 05/13/21 1226

## 2021-05-13 NOTE — ED Notes (Signed)
See triage note  presents s/p fall  states she fell this am over her son's dog  landed on left shoulder

## 2021-05-13 NOTE — ED Triage Notes (Signed)
Pt come with c/o trip and fall this am. Pt did hit her head. No loc or blood  thinners. Pt states left shoulder and arm pain.

## 2021-05-13 NOTE — Discharge Instructions (Signed)
Follow-up with [facility] orthopedics.  Call today to make an appointment for next week. Apply ice to the left shoulder Return emergency department worsening Take pain medication as prescribed

## 2021-05-29 ENCOUNTER — Ambulatory Visit (INDEPENDENT_AMBULATORY_CARE_PROVIDER_SITE_OTHER): Payer: Medicare Other | Admitting: Pharmacist

## 2021-05-29 ENCOUNTER — Other Ambulatory Visit: Payer: Self-pay

## 2021-05-29 DIAGNOSIS — E1140 Type 2 diabetes mellitus with diabetic neuropathy, unspecified: Secondary | ICD-10-CM | POA: Diagnosis not present

## 2021-05-29 NOTE — Progress Notes (Signed)
Subjective:    [name], female    DOB: 1955-11-01, 65 y.o.   MRN: [id number]  HPI Patient is a 65 y.o. female who presents for diabetes management. She is in good spirits and presents with assistance of walker. Patient was referred and last seen by provider, Dr. Andria Frames, on 02/25/21. Last seen in pharmacy clinic on 04/16/21.  Patient presented today with left arm in a sling. Patient states she lost her balance trying to step over her son's dog and fell. She is currently being followed for this by Dr. Posey Pronto.  She states her blood glucose readings have been about the same with not much change.   Patient reports diabetes was diagnosed around 34 years ago.   Insurance coverage/medication affordability: UHC Medicare/Medicaid  Current diabetes medications include: Jardiance 20 mg (two of the 57m), metformin 1007mBID, Ozempic 54m6mnce weekly on Thursdays (patient believes she has completed 3 doses) Current hypertension medications include: N/a Current hyperlipidemia medications include: atorvastatin 48m34mtient states that She is taking her medications as prescribed. Patient reports adherence with medications.  Patient reports eating 3 meals/day Breakfast: Mostly eats cereal plain or strawberry [employer]; strawberry cream no-sugar oatmeal Lunch: sandwich, sometimes will skip 1x/week Dinner: Frozen dinner (rice, steak, mac + cheese) Drinks: 1 regular Dr PeppMalachi Bondster, juice in the AM Snacks: sometimes, not every often - peanut butter no sugar   Do you feel that your medications are working for you?  Yes   Have you been experiencing any side effects to the medications prescribed? No  Do you have any problems obtaining medications due to transportation or finances?  No   Patient denies hypoglycemic events. Patient reports polyuria (increased urination).  Patient reports polyphagia (increased appetite). Patient reports polydipsia (increased thirst).  Patient denies  neuropathy (nerve pain).  Patient denies visual changes. -- reports seeing eye doctor and everything is going good. Is in the process of getting new glasses  Patient reports self foot exams, gets nails done with PCP q 3 mo  Patient reported: Fasting blood glucose: 153 Post-prandial/random blood glucose: High 100's - low 200's  Objective:   Labs:   Physical Exam Neurological:     Mental Status: She is alert and oriented to person, place, and time.    Review of Systems  Gastrointestinal:  Negative for nausea and vomiting.    Lab Results  Component Value Date   HGBA1C 8.5 (A) 02/25/2021   HGBA1C 6.8 (A) 05/18/2020   HGBA1C 7.2 (H) 01/31/2020    Lab Results  Component Value Date   MICRALBCREAT <30 02/25/2021    Lipid Panel     Component Value Date/Time   CHOL 121 04/16/2021 1052   TRIG 81 04/16/2021 1052   HDL 50 04/16/2021 1052   CHOLHDL 2.4 04/16/2021 1052   CHOLHDL 6.9 (H) 04/26/2015 0918   VLDL 62 (H) 04/26/2015 0918   LDLCALC 55 04/16/2021 1052   LDLDIRECT 103 (H) 08/10/2009 2233    Clinical Atherosclerotic Cardiovascular Disease (ASCVD): No  The ASCVD Risk score (Arnett DK, et al., 2019) failed to calculate for the following reasons:   The valid total cholesterol range is 130 to 320 mg/dL   Assessment/Plan:   T2DM is not controlled likely due to sub-optimal dietary choices. Medication adherence appears optimal. Will increase patient's Ozempic to 2mg 854mo injections of the 54mg) 154mer patient completes current 54mg pe25mPatient believes she has one dose of 54mg lef68mo take. Following instruction patient verbalized understanding of  treatment plan.    Continued GLP-1 Ozempic 42m once weekly on Thursdays and increase to 27monce complete with 93m693men Continued Jardiance 2m67mwo of 10mg22mlets) and then pick up Jardiance 25 mg daily  Continued metformin 1000mg 27mExtensively discussed pathophysiology of diabetes, dietary effects on blood sugar control, and  recommended lifestyle interventions. Patient will adhere to dietary changes of increasing vegetables, decreasing carbohydrates, adding water to orange juice in the morning, and trying to sub out Dr. PepperMalachi Bondsprite zero Counseled on s/sx of and management of hypoglycemia Next A1C anticipated February 2023  Follow-up appointment in 4 weeks to review sugar readings. Written patient instructions provided.  This appointment required 30 minutes of direct patient care.  This patient was seen with Ben CoElyse JarvismD Candidate.  Thank you for involving pharmacy to assist in providing this patient's care.

## 2021-05-29 NOTE — Assessment & Plan Note (Signed)
  T2DM is not controlled likely due to sub-optimal dietary choices. Medication adherence appears optimal. Will increase patient's Ozempic to 2mg  (two injections of the 1mg ) after patient completes current 1mg  pen. Patient believes she has one dose of 1mg  left to take. Following instruction patient verbalized understanding of treatment plan.    1. Continued GLP-1 Ozempic 1mg  once weekly on Thursdays and increase to 2mg  once complete with 1mg  pen 2. Continued Jardiance 20mg  (two of 10mg  tablets) and then pick up Jardiance 25 mg daily  3. Continued metformin 1000mg  BID 4. Extensively discussed pathophysiology of diabetes, dietary effects on blood sugar control, and recommended lifestyle interventions. 5. Patient will adhere to dietary changes of increasing vegetables, decreasing carbohydrates, adding water to orange juice in the morning, and trying to sub out Dr. Malachi Bonds for Sprite zero 6. Counseled on s/sx of and management of hypoglycemia 7. Next A1C anticipated February 2023

## 2021-05-29 NOTE — Patient Instructions (Signed)
[name] it was a pleasure seeing you today.   Please do the following:  Increase your ozempic to 2mg  (can take two injections of your 1mg  pen) after your finish your current pen as directed today during your appointment. If you have any questions or if you believe something has occurred because of this change, call me or your doctor to let one of [place] know.  Continue checking blood sugars at home. It's really important that you record these and bring these in to your next doctor's appointment.  Continue making the lifestyle changes we've discussed together during our visit. Diet and exercise play a significant role in improving your blood sugars.  Follow-up with me in four weeks   Hypoglycemia or low blood sugar:   Low blood sugar can happen quickly and may become an emergency if not treated right away.   While this shouldn't happen often, it can be brought upon if you skip a meal or do not eat enough. Also, if your insulin or other diabetes medications are dosed too high, this can cause your blood sugar to go to low.   Warning signs of low blood sugar include: Feeling shaky or dizzy Feeling weak or tired  Excessive hunger Feeling anxious or upset  Sweating even when you aren't exercising  What to do if I experience low blood sugar? Follow the Rule of 15 Check your blood sugar with your meter. If lower than 70, proceed to step 2.  Treat with 15 grams of fast acting carbs which is found in 3-4 glucose tablets. If none are available you can try hard candy, 1 tablespoon of sugar or honey,4 ounces of fruit juice, or 6 ounces of REGULAR soda.  Re-check your sugar in 15 minutes. If it is still below 70, do what you did in step 2 again. If your blood sugar has come back up, go ahead and eat a snack or small meal made up of complex carbs (ex. Whole grains) and protein at this time to avoid recurrence of low blood sugar.

## 2021-05-30 LAB — BASIC METABOLIC PANEL
BUN/Creatinine Ratio: 11 — ABNORMAL LOW (ref 12–28)
BUN: 6 mg/dL — ABNORMAL LOW (ref 8–27)
CO2: 24 mmol/L (ref 20–29)
Calcium: 9.4 mg/dL (ref 8.7–10.3)
Chloride: 102 mmol/L (ref 96–106)
Creatinine, Ser: 0.53 mg/dL — ABNORMAL LOW (ref 0.57–1.00)
Glucose: 137 mg/dL — ABNORMAL HIGH (ref 70–99)
Potassium: 4.8 mmol/L (ref 3.5–5.2)
Sodium: 142 mmol/L (ref 134–144)
eGFR: 103 mL/min/{1.73_m2} (ref >59)

## 2021-05-30 LAB — HEMOGLOBIN A1C
Est. average glucose Bld gHb Est-mCnc: 137 mg/dL
Hgb A1c MFr Bld: 6.4 % — ABNORMAL HIGH (ref 4.8–5.6)

## 2021-06-10 ENCOUNTER — Other Ambulatory Visit: Payer: Self-pay | Admitting: Family Medicine

## 2021-06-10 DIAGNOSIS — K219 Gastro-esophageal reflux disease without esophagitis: Secondary | ICD-10-CM

## 2021-06-11 ENCOUNTER — Other Ambulatory Visit: Payer: Self-pay

## 2021-06-11 ENCOUNTER — Ambulatory Visit (INDEPENDENT_AMBULATORY_CARE_PROVIDER_SITE_OTHER): Payer: Medicare Other | Admitting: Podiatry

## 2021-06-11 DIAGNOSIS — E1142 Type 2 diabetes mellitus with diabetic polyneuropathy: Secondary | ICD-10-CM

## 2021-06-11 DIAGNOSIS — M79676 Pain in unspecified toe(s): Secondary | ICD-10-CM

## 2021-06-11 DIAGNOSIS — B351 Tinea unguium: Secondary | ICD-10-CM | POA: Diagnosis not present

## 2021-06-15 ENCOUNTER — Encounter: Payer: Self-pay | Admitting: Podiatry

## 2021-06-15 NOTE — Progress Notes (Signed)
  Subjective:  [name], female    DOB: 09-Mar-1956,  MRN: [id number]  [name] presents to clinic today for at risk foot care with history of diabetic neuropathy and painful elongated mycotic toenails 1-5 bilaterally which are tender when wearing enclosed shoe gear. Pain is relieved with periodic professional debridement.  Her neuropathy is managed with gabapentin.  Patient states blood glucose was in the 150's on last night.  Patient did not check blood glucose today.  PCP is Ellen Essex, MD , and last visit was two weeks ago.  Allergies  Allergen Reactions   Pregabalin Nausea And Vomiting   Hydrocodone-Acetaminophen Nausea And Vomiting    Review of Systems: Negative except as noted in the HPI. Objective:   Constitutional [name] is a pleasant 65 y.o. Caucasian female, WD, WN in NAD. AAO x 3.   Vascular CFT <4 seconds b/l LE. Faintly palpable pedal pulses b/l. Pedal hair absent. No pain with calf compression RLE. Dependent rubor noted b/l lower extremities.  Neurologic Normal speech. Oriented to person, place, and time. Pt has subjective symptoms of neuropathy. Protective sensation intact 5/5 intact bilaterally with 10g monofilament b/l. Vibratory sensation intact b/l.  Dermatologic Pedal integument with normal turgor, texture and tone BLE. No open wounds b/l LE. No interdigital macerations noted b/l LE. Toenails 1-5 b/l elongated, discolored, dystrophic, thickened, crumbly with subungual debris and tenderness to dorsal palpation.  Orthopedic: Normal muscle strength 5/5 to all lower extremity muscle groups bilaterally. No pain, crepitus or joint limitation noted with ROM b/l LE. No gross bony pedal deformities b/l. Patient ambulates independently without assistive aids.   Radiographs: None  Last A1c:  Hemoglobin A1C Latest Ref Rng & Units 05/29/2021 02/25/2021  HGBA1C 4.8 - 5.6 % 6.4(H) 8.5(A)  Some recent data might be hidden   Assessment:   1. Pain  due to onychomycosis of toenail   2. Diabetic peripheral neuropathy associated with type 2 diabetes mellitus ([place])    Plan:  Patient was evaluated and treated and all questions answered. Consent given for treatment as described below: -No new findings. No new orders. -Continue foot and shoe inspections daily. Monitor blood glucose per PCP/Endocrinologist's recommendations. -Mycotic toenails 1-5 bilaterally were debrided in length and girth with sterile nail nippers and dremel without incident. -Patient/POA to call should there be question/concern in the interim.  Return in about 3 months (around 09/11/2021).  [name], DPM

## 2021-06-26 ENCOUNTER — Ambulatory Visit (INDEPENDENT_AMBULATORY_CARE_PROVIDER_SITE_OTHER): Payer: Medicare Other | Admitting: Family Medicine

## 2021-06-26 ENCOUNTER — Encounter: Payer: Self-pay | Admitting: Family Medicine

## 2021-06-26 ENCOUNTER — Other Ambulatory Visit: Payer: Self-pay

## 2021-06-26 ENCOUNTER — Ambulatory Visit (INDEPENDENT_AMBULATORY_CARE_PROVIDER_SITE_OTHER): Payer: Medicare Other | Admitting: Pharmacist

## 2021-06-26 VITALS — BP 148/93 | HR 105 | Wt 200.0 lb

## 2021-06-26 DIAGNOSIS — K130 Diseases of lips: Secondary | ICD-10-CM

## 2021-06-26 DIAGNOSIS — R220 Localized swelling, mass and lump, head: Secondary | ICD-10-CM | POA: Diagnosis not present

## 2021-06-26 DIAGNOSIS — E1140 Type 2 diabetes mellitus with diabetic neuropathy, unspecified: Secondary | ICD-10-CM

## 2021-06-26 MED ORDER — OZEMPIC (2 MG/DOSE) 8 MG/3ML ~~LOC~~ SOPN: 2.0000 mg | PEN_INJECTOR | SUBCUTANEOUS | 2 refills | Status: DC

## 2021-06-26 MED ORDER — AMOXICILLIN-POT CLAVULANATE 875-125 MG PO TABS
1.0000 | ORAL_TABLET | Freq: Two times a day (BID) | ORAL | 0 refills | Status: AC
Start: 2021-06-26 — End: 2021-07-03

## 2021-06-26 NOTE — Progress Notes (Signed)
    SUBJECTIVE:   CHIEF COMPLAINT / HPI:   Pruritic scabbed swollen lower lip - one week duration - started after accidentally biting lip  - burning and aching - swelled up in first couple days after bite - now purulent bloody drainage from left lip nodule - has been trying blistex, made it sting and burn - no swelling of tongue or throat - no fever, chills - no known food allergies - has two intolerances percoset and pregabalin - no ACE/ARBs on current med list, and it is up to date  58  PMH / [place]:  Patient Active Problem List   Diagnosis Date Noted   Infection of lip 06/26/2021   Polypharmacy 02/25/2021   Lump on neck 05/19/2020   Dementia ([place]) 03/12/2020   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   Adenomatous polyp 10/14/2019   Delayed wound healing 09/07/2019   History of lumbar laminectomy for spinal cord decompression 08/26/2019   Mixed Alzheimer's and vascular dementia ([place]) 08/28/2017   Memory loss 08/10/2017   Urge incontinence 07/25/2016   Impaired functional mobility, balance, and endurance 07/24/2016   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/15/2015   Dyslipidemia associated with type 2 diabetes mellitus ([place]) 04/26/2015   Benign paroxysmal positional vertigo 01/24/2015   Type 2 diabetes mellitus with diabetic neuropathy, unspecified ([place]) 04/26/2014   Vitamin D deficiency 07/11/2013   Mood disorder ([place]) 07/11/2013   Osteopenia 11/04/2012   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   Chronic pain syndrome 04/26/2010   Thyromegaly 09/17/2006   Morbid obesity ([place]) 09/17/2006   GASTROESOPHAGEAL REFLUX, NO ESOPHAGITIS 09/17/2006     OBJECTIVE:   BP (!) 148/93   Pulse (!) 105   Wt 200 lb (90.7 kg)   SpO2 97%   BMI 33.28 kg/m    Physical Exam General: Awake, alert, oriented, no acute distress HEENT: Lower lip diffusely swollen with erythematous patches and crusting, firm mobile nodule  palpable on left lower lip Respiratory: Unlabored respirations, speaking in full sentences, no respiratory distress     ASSESSMENT/PLAN:   Infection of lip Acute, 1 week duration.  Purulent erythematous infection lower lip.  No systemic symptoms or red flag signs.  Airway patent and without impending threat.  Does not appear to be medication related given clinical features and lack of known offending medication on current med list.  We will treat as bacterial soft tissue infection with Augmentin x7 days with close follow-up scheduled for 12/15.  Strict return precautions given, see AVS for more.     Ezequiel Essex, MD [place]

## 2021-06-26 NOTE — Assessment & Plan Note (Signed)
Acute, 1 week duration.  Purulent erythematous infection lower lip.  No systemic symptoms or red flag signs.  Airway patent and without impending threat.  Does not appear to be medication related given clinical features and lack of known offending medication on current med list.  We will treat as bacterial soft tissue infection with Augmentin x7 days with close follow-up scheduled for 12/15.  Strict return precautions given, see AVS for more.

## 2021-06-26 NOTE — Patient Instructions (Signed)
Miss Bohne it was a pleasure seeing you today.   Please do the following:  Continue your medications as directed today during your appointment. If you have any questions or if you believe something has occurred because of this change, call me or your doctor to let one of [place] know.  Continue checking blood sugars at home. It's really important that you record these and bring these in to your next doctor's appointment.  Continue making the lifestyle changes we've discussed together during our visit. Diet and exercise play a significant role in improving your blood sugars.  Follow-up with me in four weeks.    Hypoglycemia or low blood sugar:   Low blood sugar can happen quickly and may become an emergency if not treated right away.   While this shouldn't happen often, it can be brought upon if you skip a meal or do not eat enough. Also, if your insulin or other diabetes medications are dosed too high, this can cause your blood sugar to go to low.   Warning signs of low blood sugar include: Feeling shaky or dizzy Feeling weak or tired  Excessive hunger Feeling anxious or upset  Sweating even when you aren't exercising  What to do if I experience low blood sugar? Follow the Rule of 15 Check your blood sugar with your meter. If lower than 70, proceed to step 2.  Treat with 15 grams of fast acting carbs which is found in 3-4 glucose tablets. If none are available you can try hard candy, 1 tablespoon of sugar or honey,4 ounces of fruit juice, or 6 ounces of REGULAR soda.  Re-check your sugar in 15 minutes. If it is still below 70, do what you did in step 2 again. If your blood sugar has come back up, go ahead and eat a snack or small meal made up of complex carbs (ex. Whole grains) and protein at this time to avoid recurrence of low blood sugar.

## 2021-06-26 NOTE — Patient Instructions (Signed)
It was wonderful to meet you today. Thank you for allowing me to be a part of your care. Below is a short summary of what we discussed at your visit today:  Lip infection - Take augmentin twice a day for 7 days straight - Come to your follow up appointment on Friday the 16 th - You may use vaseline on your lip to keep moisture in and prevent irritation  GO TO THE EMERGENCY ROOM IF: - tongue swelling - throat swelling - difficulty breathing - swelling in the front of your neck - fever, chills     If you have any questions or concerns, please do not hesitate to contact us via phone or MyChart message.   Ezequiel Essex, MD

## 2021-06-26 NOTE — Assessment & Plan Note (Signed)
  T2DM is not controlled likely due to sub-optimal dietary choices. Did not spend much time with patient reviewing diabetes management as focus of visit was surrounding getting patient appointment with provider to analyze swollen lip. Will continue current medications Following instruction patient verbalized understanding of treatment plan.    1. Continued GLP-1 Ozempic  2mg  once weekly on Thursdays 2. Continued SGLT2 Jardiance 25 mg daily  3. Continued metformin 1000mg  BID 4. Extensively discussed pathophysiology of diabetes, dietary effects on blood sugar control, and recommended lifestyle interventions. 5. Patient will adhere to dietary changes of increasing vegetables, decreasing carbohydrates, adding water to orange juice in the morning, and trying to sub out Dr. Malachi Bonds for Sprite zero 6. Counseled on s/sx of and management of hypoglycemia 7. Next A1C anticipated February 2023  Follow-up appointment in 4 weeks to review sugar readings.

## 2021-06-26 NOTE — Progress Notes (Signed)
Subjective:    [name], female    DOB: 1955-08-14, 65 y.o.   MRN: [id number]  HPI Patient is a 65 y.o. female who presents for diabetes management. She is in good spirits and presents with assistance of walker. Patient was referred and last seen by provider, Dr. Andria Frames, on 02/25/21. Last seen in pharmacy clinic on 05/29/21.  Patient reports that she bit her lip a few days ago and that it had been excreting pus. Patient revealed her lip which was very swollen.  She states her blood glucose readings have been about the same with not much change.   Patient reports diabetes was diagnosed around 34 years ago.   Insurance coverage/medication affordability: [employer] Medicare/Medicaid  Current diabetes medications include: Jardiance 20 mg (two of the 64m), metformin 10074mBID, Ozempic 36m34mnce weekly on Thursdays  Current hypertension medications include: N/a Current hyperlipidemia medications include: atorvastatin 95m70mtient states that She is taking her medications as prescribed. Patient reports adherence with medications.  Patient reports eating 3 meals/day Breakfast: Mostly eats cereal plain or strawberry Special [employer]; strawberry cream no-sugar oatmeal Lunch: sandwich, sometimes will skip 1x/week Dinner: Frozen dinner (rice, steak, mac + cheese) Drinks: 1 regular Dr PeppMalachi Bondster, juice in the AM Snacks: sometimes, not every often - peanut butter no sugar   Do you feel that your medications are working for you?  Yes   Have you been experiencing any side effects to the medications prescribed? No  Do you have any problems obtaining medications due to transportation or finances?  No   Patient denies hypoglycemic events. Patient reports polyuria (increased urination).  Patient reports polyphagia (increased appetite). Patient reports polydipsia (increased thirst).  Patient denies neuropathy (nerve pain).  Patient denies visual changes. -- reports seeing eye doctor and  everything is going good. Is in the process of getting new glasses  Patient reports self foot exams, gets nails done with PCP q 3 mo  Patient reported: Fasting blood glucose: 153 Post-prandial/random blood glucose: High 100's - low 200's  Objective:   Labs:   Physical Exam Neurological:     Mental Status: She is alert and oriented to person, place, and time.    Review of Systems  Gastrointestinal:  Negative for nausea and vomiting.    Lab Results  Component Value Date   HGBA1C 6.4 (H) 05/29/2021   HGBA1C 8.5 (A) 02/25/2021   HGBA1C 6.8 (A) 05/18/2020    Lab Results  Component Value Date   MICRALBCREAT <30 02/25/2021    Lipid Panel     Component Value Date/Time   CHOL 121 04/16/2021 1052   TRIG 81 04/16/2021 1052   HDL 50 04/16/2021 1052   CHOLHDL 2.4 04/16/2021 1052   CHOLHDL 6.9 (H) 04/26/2015 0918   VLDL 62 (H) 04/26/2015 0918   LDLCALC 55 04/16/2021 1052   LDLDIRECT 103 (H) 08/10/2009 2233    Clinical Atherosclerotic Cardiovascular Disease (ASCVD): No  The ASCVD Risk score (Arnett DK, et al., 2019) failed to calculate for the following reasons:   The valid total cholesterol range is 130 to 320 mg/dL   Assessment/Plan:   T2DM is not controlled likely due to sub-optimal dietary choices. Did not spend much time with patient reviewing diabetes management as focus of visit was surrounding getting patient appointment with provider to analyze swollen lip. Will continue current medications Following instruction patient verbalized understanding of treatment plan.    Continued GLP-1 Ozempic  36mg 25me weekly on Thursdays Continued SGLT2 Jardiance  25 mg daily  Continued metformin 1041m BID Extensively discussed pathophysiology of diabetes, dietary effects on blood sugar control, and recommended lifestyle interventions. Patient will adhere to dietary changes of increasing vegetables, decreasing carbohydrates, adding water to orange juice in the morning, and trying to  sub out Dr. PMalachi Bondsfor Sprite zero Counseled on s/sx of and management of hypoglycemia Next A1C anticipated February 2023  Follow-up appointment in 4 weeks to review sugar readings. Written patient instructions provided.  This appointment required 20 minutes of direct patient care.  Thank you for involving pharmacy to assist in providing this patient's care.

## 2021-07-05 ENCOUNTER — Encounter: Payer: Self-pay | Admitting: Student

## 2021-07-05 ENCOUNTER — Ambulatory Visit (INDEPENDENT_AMBULATORY_CARE_PROVIDER_SITE_OTHER): Payer: Medicare Other

## 2021-07-05 ENCOUNTER — Ambulatory Visit (INDEPENDENT_AMBULATORY_CARE_PROVIDER_SITE_OTHER): Payer: Medicare Other | Admitting: Student

## 2021-07-05 ENCOUNTER — Other Ambulatory Visit: Payer: Self-pay

## 2021-07-05 VITALS — BP 128/70 | HR 96 | Wt 200.0 lb

## 2021-07-05 DIAGNOSIS — K130 Diseases of lips: Secondary | ICD-10-CM | POA: Diagnosis not present

## 2021-07-05 DIAGNOSIS — Z23 Encounter for immunization: Secondary | ICD-10-CM | POA: Diagnosis not present

## 2021-07-05 DIAGNOSIS — E1140 Type 2 diabetes mellitus with diabetic neuropathy, unspecified: Secondary | ICD-10-CM | POA: Diagnosis not present

## 2021-07-05 NOTE — Assessment & Plan Note (Addendum)
Patient recently treated with 7-day course of Augmentin.  She reports compliance and tolerance to medication.  Lesion is currently resolved and patient denies any fever, discharge, tenderness or tongue edema. Recommend application of Vaseline for dry lips and patient is agreeable.

## 2021-07-05 NOTE — Progress Notes (Signed)
° ° °  SUBJECTIVE:   CHIEF COMPLAINT / HPI:   Ms. [name] is a 65 year old female who presents today for follow-up after treatment for lip infection. Patient reports her lip has responded well to her antibiotic treatment. She was recently started on 7 day course of Augmentin. Patient reports tolerance to her medication. No complaints today other than she has dry lips. She has no tongue swelling or chocking sensation.   PERTINENT  PMH / PSH: T2DM, GERD, COPD  OBJECTIVE:   BP 128/70    Pulse 96    Wt 200 lb (90.7 kg)    BMI 33.28 kg/m       Physical Exam General: Alert, well appearing, NAD Cardiovascular: RRR, No Murmurs, Normal S2/S2 Respiratory: CTAB, No wheezing or Rales Extremities: No edema on extremities   Skin: Warm and dry  ASSESSMENT/PLAN:   Infection of lip Patient recently treated with 7-day course of Augmentin.  She reports compliance and tolerance to medication.  Lesion is currently resolved and patient denies any fever, discharge, tenderness or tongue edema. Recommend application of Vaseline for dry lips and patient is agreeable.     Health Maintenance Patient received her Covid Bivalent booster and Flu vaccine today. Discussed risk and benefit of the vaccines.  Sent in referral for her annual ophthalmology exam.   Alen Bleacher, MD [place]

## 2021-07-05 NOTE — Patient Instructions (Signed)
It was wonderful to meet you today. Thank you for allowing me to be a part of your care. Below is a short summary of what we discussed at your visit today:  You received the Flu and Covid Bivalent booster vaccine.   I sent in referral for your annual eye exam   Happy Holiday!!!  Please bring all of your medications to every appointment!  If you have any questions or concerns, please do not hesitate to contact us via phone or MyChart message.   Alen Bleacher, MD [facility]

## 2021-07-25 ENCOUNTER — Ambulatory Visit: Payer: Medicare Other | Admitting: Pharmacist

## 2021-08-19 ENCOUNTER — Other Ambulatory Visit: Payer: Self-pay | Admitting: Family Medicine

## 2021-08-19 DIAGNOSIS — H8110 Benign paroxysmal vertigo, unspecified ear: Secondary | ICD-10-CM

## 2021-09-18 ENCOUNTER — Other Ambulatory Visit: Payer: Self-pay | Admitting: Family Medicine

## 2021-09-23 ENCOUNTER — Ambulatory Visit (INDEPENDENT_AMBULATORY_CARE_PROVIDER_SITE_OTHER): Payer: Medicare Other | Admitting: Podiatry

## 2021-09-23 DIAGNOSIS — Z91199 Patient's noncompliance with other medical treatment and regimen due to unspecified reason: Secondary | ICD-10-CM

## 2021-09-26 ENCOUNTER — Ambulatory Visit: Payer: Medicare Other | Admitting: Pharmacist

## 2021-09-29 NOTE — Progress Notes (Signed)
No show for appointment. Same day cancellation. Has stomach virus. ?

## 2021-10-24 ENCOUNTER — Other Ambulatory Visit: Payer: Self-pay | Admitting: Family Medicine

## 2021-10-24 DIAGNOSIS — H8110 Benign paroxysmal vertigo, unspecified ear: Secondary | ICD-10-CM

## 2021-10-28 ENCOUNTER — Other Ambulatory Visit: Payer: Self-pay | Admitting: *Deleted

## 2021-12-18 ENCOUNTER — Other Ambulatory Visit: Payer: Self-pay | Admitting: Family Medicine

## 2021-12-19 ENCOUNTER — Other Ambulatory Visit: Payer: Self-pay | Admitting: *Deleted

## 2021-12-19 DIAGNOSIS — E119 Type 2 diabetes mellitus without complications: Secondary | ICD-10-CM

## 2021-12-20 ENCOUNTER — Other Ambulatory Visit: Payer: Self-pay | Admitting: Family Medicine

## 2021-12-20 DIAGNOSIS — F39 Unspecified mood [affective] disorder: Secondary | ICD-10-CM

## 2021-12-20 DIAGNOSIS — G894 Chronic pain syndrome: Secondary | ICD-10-CM

## 2021-12-20 MED ORDER — ACCU-CHEK FASTCLIX LANCETS MISC: 11 refills | Status: DC

## 2021-12-24 ENCOUNTER — Encounter: Payer: Self-pay | Admitting: *Deleted

## 2022-01-01 ENCOUNTER — Ambulatory Visit (INDEPENDENT_AMBULATORY_CARE_PROVIDER_SITE_OTHER): Payer: Medicare Other

## 2022-01-01 ENCOUNTER — Ambulatory Visit
Admission: EM | Admit: 2022-01-01 | Discharge: 2022-01-01 | Disposition: A | Discharge status: Home / Self Care | Payer: Medicare Other | Attending: Urgent Care | Admitting: Urgent Care

## 2022-01-01 ENCOUNTER — Encounter: Payer: Self-pay | Admitting: Emergency Medicine

## 2022-01-01 DIAGNOSIS — M5134 Other intervertebral disc degeneration, thoracic region: Secondary | ICD-10-CM

## 2022-01-01 DIAGNOSIS — M8580 Other specified disorders of bone density and structure, unspecified site: Secondary | ICD-10-CM | POA: Diagnosis not present

## 2022-01-01 DIAGNOSIS — M546 Pain in thoracic spine: Secondary | ICD-10-CM | POA: Diagnosis not present

## 2022-01-01 DIAGNOSIS — E119 Type 2 diabetes mellitus without complications: Secondary | ICD-10-CM | POA: Diagnosis not present

## 2022-01-01 MED ORDER — PREDNISONE 50 MG PO TABS: 50.0000 mg | ORAL_TABLET | Freq: Every day | ORAL | 0 refills | Status: DC
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
## 2022-02-04 ENCOUNTER — Other Ambulatory Visit: Payer: Self-pay | Admitting: *Deleted

## 2022-02-04 DIAGNOSIS — E119 Type 2 diabetes mellitus without complications: Secondary | ICD-10-CM

## 2022-02-04 MED ORDER — METFORMIN HCL 1000 MG PO TABS: 1000.0000 mg | ORAL_TABLET | Freq: Two times a day (BID) | ORAL | 1 refills | Status: DC

## 2022-02-25 ENCOUNTER — Other Ambulatory Visit: Payer: Self-pay | Admitting: Family Medicine

## 2022-02-25 DIAGNOSIS — H8110 Benign paroxysmal vertigo, unspecified ear: Secondary | ICD-10-CM

## 2022-03-15 ENCOUNTER — Other Ambulatory Visit: Payer: Self-pay | Admitting: Family Medicine

## 2022-03-15 DIAGNOSIS — K219 Gastro-esophageal reflux disease without esophagitis: Secondary | ICD-10-CM

## 2022-04-06 ENCOUNTER — Other Ambulatory Visit: Payer: Self-pay | Admitting: Family Medicine

## 2022-04-06 DIAGNOSIS — E1169 Type 2 diabetes mellitus with other specified complication: Secondary | ICD-10-CM

## 2022-04-08 ENCOUNTER — Other Ambulatory Visit: Payer: Self-pay | Admitting: Family Medicine

## 2022-04-08 DIAGNOSIS — F0150 Vascular dementia without behavioral disturbance: Secondary | ICD-10-CM

## 2022-04-08 DIAGNOSIS — E1140 Type 2 diabetes mellitus with diabetic neuropathy, unspecified: Secondary | ICD-10-CM

## 2022-04-08 DIAGNOSIS — E1169 Type 2 diabetes mellitus with other specified complication: Secondary | ICD-10-CM

## 2022-04-08 NOTE — Progress Notes (Signed)
Received refill request for cholesterol medicine.   Patient is due for next annual lipid panel. Last 04/16/2021. Will refill statin and encourage to come in for lab recheck.   Looked ahead and she is also due for A1c and urine protein:creatinine ratio. Will order for future collection, too.   Ezequiel Essex, MD

## 2022-05-02 ENCOUNTER — Other Ambulatory Visit: Payer: Self-pay | Admitting: Family Medicine

## 2022-05-02 DIAGNOSIS — Z1231 Encounter for screening mammogram for malignant neoplasm of breast: Secondary | ICD-10-CM

## 2022-05-02 DIAGNOSIS — E1169 Type 2 diabetes mellitus with other specified complication: Secondary | ICD-10-CM

## 2022-05-13 ENCOUNTER — Ambulatory Visit (INDEPENDENT_AMBULATORY_CARE_PROVIDER_SITE_OTHER): Payer: Medicare Other | Admitting: Family Medicine

## 2022-05-13 ENCOUNTER — Encounter: Payer: Self-pay | Admitting: Family Medicine

## 2022-05-13 VITALS — BP 128/74 | HR 89 | Ht 64.0 in | Wt 179.4 lb

## 2022-05-13 DIAGNOSIS — I872 Venous insufficiency (chronic) (peripheral): Secondary | ICD-10-CM | POA: Diagnosis not present

## 2022-05-13 DIAGNOSIS — E1169 Type 2 diabetes mellitus with other specified complication: Secondary | ICD-10-CM

## 2022-05-13 DIAGNOSIS — E785 Hyperlipidemia, unspecified: Secondary | ICD-10-CM

## 2022-05-13 DIAGNOSIS — Z23 Encounter for immunization: Secondary | ICD-10-CM

## 2022-05-13 DIAGNOSIS — M7989 Other specified soft tissue disorders: Secondary | ICD-10-CM

## 2022-05-13 DIAGNOSIS — R011 Cardiac murmur, unspecified: Secondary | ICD-10-CM | POA: Diagnosis not present

## 2022-05-13 DIAGNOSIS — Z0000 Encounter for general adult medical examination without abnormal findings: Secondary | ICD-10-CM

## 2022-05-13 DIAGNOSIS — E1140 Type 2 diabetes mellitus with diabetic neuropathy, unspecified: Secondary | ICD-10-CM

## 2022-05-13 LAB — POCT GLYCOSYLATED HEMOGLOBIN (HGB A1C): HbA1c, POC (controlled diabetic range): 5 % (ref 0.0–7.0)

## 2022-05-13 MED ORDER — EMPAGLIFLOZIN 25 MG PO TABS: 25.0000 mg | ORAL_TABLET | Freq: Every day | ORAL | 3 refills | Status: DC

## 2022-05-13 NOTE — Patient Instructions (Addendum)
It was wonderful to see you today. Thank you for allowing me to be a part of your care. Below is a short summary of what we discussed at your visit today:  Diabetes Your A1c today is 5.0! This is improved from 6.4 back in Nov. 2022.  Forget the ozempic. We will stick with the Metformin and Jardiance.  Next check in 6 months.  Only check your blood sugar if you believe your blood sugar is low. You do NOT need to check regularly.   Foot redness and swelling Keep wearing your compression stockings every day. We will do some blood work today to see if thyroid or kidneys are contributing to this. Because of the leg swelling and the heart murmur, we will get a ultrasound of your heart called an echo.  The nurse will help to schedule this. I wanted to touch base again with the vascular surgeon about fixing those veins in your legs.  Cholesterol Today we rechecked your cholesterol and some other blood work. If the results are normal, I will send you a letter or MyChart message. If the results are abnormal, I will give you a call.    Vaccines Today you received the annual flu vaccine and the new pneumonia vaccine. You may experience some residual soreness at the injection site.  Gentle stretches and regular use of that arm will help speed up your recovery.  As the vaccines are giving your immune system a "practice run" against specific infections, you may feel a little under the weather for the next several days.  We recommend rest as needed and hydrating.  You also are eligible to receive the second Shingles vaccine. Go to your pharmacy to get your Shingles shot.    Please bring all of your medications to every appointment!  If you have any questions or concerns, please do not hesitate to contact us via phone or MyChart message.   Ezequiel Essex, MD

## 2022-05-13 NOTE — Progress Notes (Signed)
SUBJECTIVE:   CHIEF COMPLAINT / HPI:   [name] is a pleasant 66 year old woman who presents today for discussion of recent CPAP supplies requests. She also wants to check in on her diabetes, cholesterol, and bilateral leg swelling.   CPAP supplies request I have received numerous requests for CPAP supplies in the past month or so. Chart review reveals she had a home sleep study in the past but was not diagnosed with OSA. She has not been using a CPAP machine or supplies. She does not believe this is an issue and did not initiate these requests. Wonder if these faxed requests are fraudulent.   T2DM A1c Recheck today Regimen: ozempic (not obtainable due to back order), metformin 1000 mg BID, jardiance 25 mg Ozempic - can't get refill because of national back order, has not taken in last 3 weeks or so Tolerating metoformin and jardiance well without adverse side effects Lab Results  Component Value Date   HGBA1C 5.0 05/13/2022   HGBA1C 6.4 (H) 05/29/2021   HGBA1C 8.5 (A) 02/25/2021   Lab Results  Component Value Date   MICROALBUR 80 02/25/2021   LDLCALC 55 04/16/2021   CREATININE 0.52 (L) 05/13/2022   HLD Current regimen includes atorvastatin 10 mg daily Tolerating well without adverse effects Due for lipid recheck today  Health Maintenance Due for flu and pneumonia vaccinations, amenable to both Feels well today No recent fever  BLE edema Chronic issue, no recent worsening. Seen by vascular surgery 06/20/19 and found to have evidence of chronic venous insufficiency is detected in the great saphenous vein, small saphenous vein, and deep venous system. She declined surgical intervention at that time due to other necessary surgeries. Was recommended to wear compression stockings with 20-30 mmHg pressure daily. Today reports intermittent wearing of these.   PERTINENT  PMH / PSH: T2DM, HLD, mood disorder, mixed Alzheimer's and vascular dementia, COPD, degenerative joint  disease, GERD   OBJECTIVE:   BP 128/74   Pulse 89   Ht '5\' 4"'$  (1.626 m)   Wt 179 lb 6.4 oz (81.4 kg)   SpO2 99%   BMI 30.79 kg/m    PHQ-9:     05/13/2022    4:07 PM 06/26/2021    2:18 PM 02/25/2021   10:21 AM  Depression screen PHQ 2/9  Decreased Interest 0 0 0  Down, Depressed, Hopeless 0 0 0  PHQ - 2 Score 0 0 0  Altered sleeping 1  1  Tired, decreased energy 1  1  Change in appetite 0  0  Feeling bad or failure about yourself  0  0  Trouble concentrating 1  0  Moving slowly or fidgety/restless 1  0  Suicidal thoughts 0  0  PHQ-9 Score 4  2  Difficult doing work/chores   Not difficult at all    Physical Exam General: Awake, alert, oriented Cardiovascular: Regular rate and rhythm, S1 and S2 present, systolic murmur auscultated at sternal borders Respiratory: Lung fields clear to auscultation bilaterally Extremities: 2+ pitting edema of BLE at ankles, trace at knee, diffuse warm erythema equal bilaterally over feet and distal ankles Neuro: Cranial nerves II through X grossly intact, able to move all extremities spontaneously   ASSESSMENT/PLAN:   Type 2 diabetes mellitus with diabetic neuropathy, unspecified (HCC) A1c today 5.0, greatly improved from 6.4 a year ago.  Patient has made great strides in diet changes and weight loss.  Continue with metformin and Jardiance.  Given she is out  of Ozempic due to backorder, we will simply discontinue.  Will obtain urine microalbumin and lipid panel today.  Dyslipidemia associated with type 2 diabetes mellitus (HCC) Tolerating atorvastatin 10 mg daily quite well without adverse side effects.  We will obtain lipid panel today.  Chronic venous insufficiency Stable BLE swelling.  Recommend elevation and use of compression stockings 20 to 30 mmHg pressure daily along with return to vascular surgery discussed interventions.  Given heart murmur that has not been worked up to this point in chronic insufficiency, will obtain echo and BMP  today for completeness sake.  Doubt cardiac etiology given chronicity and venous insufficiency on imaging in 2020.  Murmur, cardiac Systolic murmur.  No prior echo in chart.  Given concurrent leg swelling, will go ahead and obtain echo to fully rule out cardiac etiology of leg swelling.  Healthcare maintenance Tolerated flu pneumonia vaccines well without adverse side effects.     Ellen Essex, MD [place]

## 2022-05-15 DIAGNOSIS — R011 Cardiac murmur, unspecified: Secondary | ICD-10-CM | POA: Insufficient documentation

## 2022-05-15 DIAGNOSIS — I872 Venous insufficiency (chronic) (peripheral): Secondary | ICD-10-CM | POA: Insufficient documentation

## 2022-05-15 LAB — MICROALBUMIN / CREATININE URINE RATIO
Creatinine, Urine: 48.7 mg/dL
Microalb/Creat Ratio: 16 mg/g creat (ref 0–29)
Microalbumin, Urine: 8 ug/mL

## 2022-05-15 LAB — BASIC METABOLIC PANEL
BUN/Creatinine Ratio: 23 (ref 12–28)
BUN: 12 mg/dL (ref 8–27)
CO2: 25 mmol/L (ref 20–29)
Calcium: 9.3 mg/dL (ref 8.7–10.3)
Chloride: 104 mmol/L (ref 96–106)
Creatinine, Ser: 0.52 mg/dL — ABNORMAL LOW (ref 0.57–1.00)
Glucose: 82 mg/dL (ref 70–99)
Potassium: 3.5 mmol/L (ref 3.5–5.2)
Sodium: 141 mmol/L (ref 134–144)
eGFR: 103 mL/min/{1.73_m2} (ref >59)

## 2022-05-15 LAB — BRAIN NATRIURETIC PEPTIDE: BNP: 69.8 pg/mL (ref 0.0–100.0)

## 2022-05-15 LAB — TSH: TSH: 2.86 u[IU]/mL (ref 0.450–4.500)

## 2022-05-15 NOTE — Assessment & Plan Note (Signed)
Systolic murmur.  No prior echo in chart.  Given concurrent leg swelling, will go ahead and obtain echo to fully rule out cardiac etiology of leg swelling.

## 2022-05-15 NOTE — Assessment & Plan Note (Signed)
Tolerating atorvastatin 10 mg daily quite well without adverse side effects.  We will obtain lipid panel today.

## 2022-05-15 NOTE — Assessment & Plan Note (Signed)
Stable BLE swelling.  Recommend elevation and use of compression stockings 20 to 30 mmHg pressure daily along with return to vascular surgery discussed interventions.  Given heart murmur that has not been worked up to this point in chronic insufficiency, will obtain echo and BMP today for completeness sake.  Doubt cardiac etiology given chronicity and venous insufficiency on imaging in 2020.

## 2022-05-15 NOTE — Assessment & Plan Note (Addendum)
A1c today 5.0, greatly improved from 6.4 a year ago.  Patient has made great strides in diet changes and weight loss.  Continue with metformin and Jardiance.  Given she is out of Ozempic due to backorder, we will simply discontinue.  Will obtain urine microalbumin and lipid panel today.

## 2022-05-15 NOTE — Assessment & Plan Note (Signed)
Tolerated flu pneumonia vaccines well without adverse side effects.

## 2022-05-26 ENCOUNTER — Ambulatory Visit (HOSPITAL_COMMUNITY)
Admission: RE | Admit: 2022-05-26 | Discharge: 2022-05-26 | Disposition: A | Discharge status: Home / Self Care | Payer: Medicare Other | Source: Ambulatory Visit | Attending: Family Medicine | Admitting: Family Medicine

## 2022-05-26 DIAGNOSIS — J449 Chronic obstructive pulmonary disease, unspecified: Secondary | ICD-10-CM | POA: Diagnosis not present

## 2022-05-26 DIAGNOSIS — G309 Alzheimer's disease, unspecified: Secondary | ICD-10-CM | POA: Diagnosis not present

## 2022-05-26 DIAGNOSIS — R011 Cardiac murmur, unspecified: Secondary | ICD-10-CM | POA: Insufficient documentation

## 2022-05-26 DIAGNOSIS — E119 Type 2 diabetes mellitus without complications: Secondary | ICD-10-CM | POA: Diagnosis not present

## 2022-05-26 DIAGNOSIS — I872 Venous insufficiency (chronic) (peripheral): Secondary | ICD-10-CM | POA: Diagnosis not present

## 2022-05-26 DIAGNOSIS — F0280 Dementia in other diseases classified elsewhere without behavioral disturbance: Secondary | ICD-10-CM | POA: Diagnosis not present

## 2022-05-26 DIAGNOSIS — E785 Hyperlipidemia, unspecified: Secondary | ICD-10-CM | POA: Diagnosis not present

## 2022-05-26 LAB — ECHOCARDIOGRAM COMPLETE
AR max vel: 1.64 cm2
AV Area VTI: 1.7 cm2
AV Area mean vel: 1.65 cm2
AV Mean grad: 15.6 mmHg
AV Peak grad: 28.2 mmHg
Ao pk vel: 2.65 m/s
Area-P 1/2: 3.37 cm2
Calc EF: 71.2 %
S' Lateral: 2.7 cm
Single Plane A2C EF: 72.3 %
Single Plane A4C EF: 69.5 %

## 2022-05-26 NOTE — Progress Notes (Signed)
  Echocardiogram 2D Echocardiogram has been performed.  [name] 05/26/2022, 2:56 PM

## 2022-06-11 ENCOUNTER — Ambulatory Visit
Admission: RE | Admit: 2022-06-11 | Discharge: 2022-06-11 | Disposition: A | Discharge status: Home / Self Care | Payer: Medicare Other | Source: Ambulatory Visit | Attending: Physician Assistant | Admitting: Physician Assistant

## 2022-06-11 DIAGNOSIS — Z1231 Encounter for screening mammogram for malignant neoplasm of breast: Secondary | ICD-10-CM

## 2022-06-17 ENCOUNTER — Encounter: Payer: Self-pay | Admitting: Family Medicine

## 2022-06-24 ENCOUNTER — Other Ambulatory Visit: Payer: Self-pay | Admitting: Family Medicine

## 2022-06-27 LAB — HM DIABETES EYE EXAM

## 2022-08-11 ENCOUNTER — Other Ambulatory Visit: Payer: Self-pay | Admitting: Family Medicine

## 2022-08-11 DIAGNOSIS — E119 Type 2 diabetes mellitus without complications: Secondary | ICD-10-CM

## 2022-08-28 ENCOUNTER — Ambulatory Visit (INDEPENDENT_AMBULATORY_CARE_PROVIDER_SITE_OTHER): Payer: 59 | Admitting: Family Medicine

## 2022-08-28 ENCOUNTER — Observation Stay (HOSPITAL_COMMUNITY)
Admission: EM | Admit: 2022-08-28 | Discharge: 2022-08-29 | Disposition: A | Discharge status: Home / Self Care | Payer: 59 | Attending: Internal Medicine | Admitting: Internal Medicine

## 2022-08-28 ENCOUNTER — Telehealth: Payer: Self-pay

## 2022-08-28 ENCOUNTER — Other Ambulatory Visit: Payer: Self-pay

## 2022-08-28 ENCOUNTER — Encounter: Payer: Self-pay | Admitting: Family Medicine

## 2022-08-28 ENCOUNTER — Encounter (HOSPITAL_COMMUNITY): Payer: Self-pay

## 2022-08-28 VITALS — BP 112/60 | HR 98 | Wt 192.0 lb

## 2022-08-28 DIAGNOSIS — R040 Epistaxis: Secondary | ICD-10-CM

## 2022-08-28 DIAGNOSIS — F0150 Vascular dementia without behavioral disturbance: Secondary | ICD-10-CM | POA: Insufficient documentation

## 2022-08-28 DIAGNOSIS — Z7984 Long term (current) use of oral hypoglycemic drugs: Secondary | ICD-10-CM | POA: Diagnosis not present

## 2022-08-28 DIAGNOSIS — Z8673 Personal history of transient ischemic attack (TIA), and cerebral infarction without residual deficits: Secondary | ICD-10-CM | POA: Insufficient documentation

## 2022-08-28 DIAGNOSIS — Z7952 Long term (current) use of systemic steroids: Secondary | ICD-10-CM | POA: Insufficient documentation

## 2022-08-28 DIAGNOSIS — Z79899 Other long term (current) drug therapy: Secondary | ICD-10-CM | POA: Diagnosis not present

## 2022-08-28 DIAGNOSIS — G309 Alzheimer's disease, unspecified: Secondary | ICD-10-CM | POA: Diagnosis not present

## 2022-08-28 DIAGNOSIS — J309 Allergic rhinitis, unspecified: Secondary | ICD-10-CM | POA: Diagnosis not present

## 2022-08-28 DIAGNOSIS — E119 Type 2 diabetes mellitus without complications: Secondary | ICD-10-CM | POA: Diagnosis not present

## 2022-08-28 DIAGNOSIS — D62 Acute posthemorrhagic anemia: Secondary | ICD-10-CM

## 2022-08-28 DIAGNOSIS — E876 Hypokalemia: Secondary | ICD-10-CM | POA: Diagnosis not present

## 2022-08-28 DIAGNOSIS — F0280 Dementia in other diseases classified elsewhere without behavioral disturbance: Secondary | ICD-10-CM | POA: Insufficient documentation

## 2022-08-28 DIAGNOSIS — K921 Melena: Secondary | ICD-10-CM | POA: Diagnosis not present

## 2022-08-28 DIAGNOSIS — Z7982 Long term (current) use of aspirin: Secondary | ICD-10-CM | POA: Diagnosis not present

## 2022-08-28 DIAGNOSIS — J449 Chronic obstructive pulmonary disease, unspecified: Secondary | ICD-10-CM | POA: Diagnosis present

## 2022-08-28 DIAGNOSIS — D619 Aplastic anemia, unspecified: Secondary | ICD-10-CM

## 2022-08-28 DIAGNOSIS — K219 Gastro-esophageal reflux disease without esophagitis: Secondary | ICD-10-CM | POA: Diagnosis present

## 2022-08-28 DIAGNOSIS — Z96652 Presence of left artificial knee joint: Secondary | ICD-10-CM | POA: Insufficient documentation

## 2022-08-28 LAB — CBC
HCT: 19.4 % — ABNORMAL LOW (ref 36.0–46.0)
Hemoglobin: 6.1 g/dL — CL (ref 12.0–15.0)
MCH: 29.6 pg (ref 26.0–34.0)
MCHC: 31.4 g/dL (ref 30.0–36.0)
MCV: 94.2 fL (ref 80.0–100.0)
Platelets: 129 10*3/uL — ABNORMAL LOW (ref 150–400)
RBC: 2.06 MIL/uL — ABNORMAL LOW (ref 3.87–5.11)
RDW: 17 % — ABNORMAL HIGH (ref 11.5–15.5)
WBC: 4.9 10*3/uL (ref 4.0–10.5)
nRBC: 0.4 % — ABNORMAL HIGH (ref 0.0–0.2)

## 2022-08-28 LAB — COMPREHENSIVE METABOLIC PANEL
ALT: 27 U/L (ref 0–44)
AST: 32 U/L (ref 15–41)
Albumin: 2.4 g/dL — ABNORMAL LOW (ref 3.5–5.0)
Alkaline Phosphatase: 94 U/L (ref 38–126)
Anion gap: 10 (ref 5–15)
BUN: 8 mg/dL (ref 8–23)
CO2: 24 mmol/L (ref 22–32)
Calcium: 8.4 mg/dL — ABNORMAL LOW (ref 8.9–10.3)
Chloride: 104 mmol/L (ref 98–111)
Creatinine, Ser: 0.64 mg/dL (ref 0.44–1.00)
GFR, Estimated: >60 mL/min (ref >60)
Glucose, Bld: 156 mg/dL — ABNORMAL HIGH (ref 70–99)
Potassium: 2.9 mmol/L — ABNORMAL LOW (ref 3.5–5.1)
Sodium: 138 mmol/L (ref 135–145)
Total Bilirubin: 0.5 mg/dL (ref 0.3–1.2)
Total Protein: 6.2 g/dL — ABNORMAL LOW (ref 6.5–8.1)

## 2022-08-28 LAB — PROTIME-INR
INR: 1.4 — ABNORMAL HIGH (ref 0.8–1.2)
Prothrombin Time: 16.9 seconds — ABNORMAL HIGH (ref 11.4–15.2)

## 2022-08-28 LAB — PREPARE RBC (CROSSMATCH)

## 2022-08-28 LAB — POCT HEMOGLOBIN: Hemoglobin: 6 g/dL — AB (ref 11–14.6)

## 2022-08-28 LAB — APTT: aPTT: 28 seconds (ref 24–36)

## 2022-08-28 MED ORDER — SODIUM CHLORIDE 0.9% IV SOLUTION: Freq: Once | INTRAVENOUS | Status: AC

## 2022-08-28 MED ORDER — POTASSIUM CHLORIDE CRYS ER 20 MEQ PO TBCR
40.0000 meq | EXTENDED_RELEASE_TABLET | Freq: Once | ORAL | Status: AC
  Administered 2022-08-28: 40 meq via ORAL
  Filled 2022-08-28: qty 2

## 2022-08-28 MED ORDER — ACETAMINOPHEN 500 MG PO TABS
1000.0000 mg | ORAL_TABLET | Freq: Once | ORAL | Status: AC
  Administered 2022-08-28: 1000 mg via ORAL
  Filled 2022-08-28: qty 2

## 2022-08-28 MED ORDER — ONDANSETRON HCL 4 MG/2ML IJ SOLN
4.0000 mg | Freq: Once | INTRAMUSCULAR | Status: AC
  Administered 2022-08-28: 4 mg via INTRAVENOUS
  Filled 2022-08-28: qty 2

## 2022-08-28 NOTE — Telephone Encounter (Signed)
Patient calls nurse line requesting referral to ENT for nose bleeds and ear pain. She reports that she has been having nose bleeds for the last five days. Also reports right sided ear pain.   She reports that her nose was bleeding "quite a bit" yesterday. She was able to stop nose bleed and has not had another one since then. She does report occasional lightheadedness.   Advised that patient schedule appointment for further evaluation.   Patient scheduled for this afternoon. Advised that patient have someone drive her to this appointment. Patient verbalizes understanding.   ED precautions discussed in the meantime.   Talbot Grumbling, RN

## 2022-08-28 NOTE — ED Triage Notes (Signed)
Pt came in via POV from her PCP d/t having nosebleeds for the past 5 days, states they have had large clots come out both sides. Endorses hands cramps, lightheaded & dizziness when she stands. A/Ox4, does NOT take thinners, pt states that 3 days ago she had several occurrences of dark stools as well, denies Emesis.

## 2022-08-28 NOTE — Patient Instructions (Addendum)
It was great to see you today! Thank you for choosing [facility] for your primary care.  Today we addressed: Bleeding from the nose: Your weakness, fatigue, shortness of breath, and symptoms since your nosebleed are all likely because you lost a lot of blood.  You appear dehydrated and have anemia.  We are sending you to the ED to get some fluids and blood.   Please do not take any ibuprofen for the next few months. It can cause bleeding. Please take Tylenol instead Please also stop taking your aspirin for now, it can also cause bleeding.  We checked your hemoglobin (blood count) today in the clinic and it was 6 (healthy is above 12 or 13).  If you ever have a heavy nosebleed lasting longer than 15ish minutes that will not stop, please call 911 right away. Nosebleeds can be life threatening.  Thank you for coming to see [place] at [facility] and for the opportunity to care for you! Charle Mclaurin, Medical Student 08/28/2022, 2:57 PM  ____________________________________________________  Make sure to check out at the front desk before you leave today.  Please arrive at least 15 minutes prior to your scheduled appointments.  If you haven't already, please set up MyChart to have easy access to your labs results, and communication with your primary care physician.  If you had blood work today, you will get a MyChart message or a letter if results are normal. Otherwise, you will get a call from [place].  If you had a referral placed, they will call you to set up an appointment. Please give [place] a call if you don't hear back in the next 2 weeks.  If you need additional refills before your next appointment, please call your pharmacy first.

## 2022-08-28 NOTE — Assessment & Plan Note (Signed)
Patient anemic with POCT Hgb 6.0 (baseline 10-11) and hypovolemic in the setting of prolonged epistaxis.  Suspect etiology of epistaxis is combination of ibuprofen and aspirin use as well as elderly age and recent cold weather.  Possible that Flonase also contributed.  Patient required IV fluids and transfusion for symptomatic anemia and was sent to ED with handoff called in. -Transfusion and workup per ED -Will likely require iron supplementation after treatment -Instructed patient to stop taking aspirin and ibuprofen before next follow-up visit due to bleeding risk -Provided education on importance of seeking emergency care in the event of unrelenting epistaxis

## 2022-08-28 NOTE — Progress Notes (Addendum)
SUBJECTIVE:   CHIEF COMPLAINT / HPI:  Chief Complaint  Patient presents with   Epistaxis, weakness    [name] is a 67 y.o. female with a past medical history of mild cognitive impairment, chronic anemia, recurrent epistaxis, T2DM, and stroke several years ago presenting to the clinic for weakness and shortness of breath following 3 days of unrelenting epistaxis.  Epistaxis Weakness, shortness of breath 5 days ago, patient experienced prolonged, unrelenting nasal bleeding for 3 days with occasional clots forming and obstructing breathing which she then reportedly pulled out of her nose.  Patient attempted to apply pressure and keep head straight but was unsuccessful in stopping bleeding, it resolved on its own.  Last nasal bleeding was 2 days ago.  Since nosebleed episode, has been getting new cramps in fingers and ankles.  Has also been struggling to ambulate and short of breath.  Required wheelchair to get around sister's [facility] home and states it "felt like running a marathon" walking into clinic (ambulates well with cane and not short of breath at baseline).  Feels weak and cold, shivering.  Has had a dry mouth and been urinating less.  Did experience dark and tarry stools immediately after nose bleed, but they resolved the same day and has not had repeat episode since.    Has a history of epistaxis 1-2 times per month since middle school, but has never had an episode like this.  No recent illnesses or injuries of any kind.  Does bruise easily in general, not any more than normal right now. Reports decreased PO intake and appetite since bleeding.  Has been taking daily aspirin for history of stroke several years ago.  Takes 1-2 ibuprofen every few days for headaches.  Has been using Flonase daily up until nosebleed.  Weather has been cold the past few days.  PERTINENT  PMH / PSH: - Hx stroke on 81 mg aspirin daily for prevention - Recurrent epistaxis since middle school - Mild  dementia, GERD, HLD, T2DM, BPPV on meclizine, lumbar spondylosis  Patient Care Team: Ezequiel Essex, MD as PCP - General (Family Medicine)  OBJECTIVE:   BP 112/60   Pulse 98   Wt 192 lb (87.1 kg)   SpO2 96%   BMI 32.96 kg/m   General: Elderly female resting in chair. Tired-appearing but in no acute distress. Pale. Alert and at baseline. HEENT:  Head: Normocephalic, atraumatic. No tenderness to percussion over sinuses. Eyes: PERRLA. Significant conjunctival pallor. Ears: TMs non-bulging and non-erythematous bilaterally. No erythema of external ear canal. Nose: Limited visibility due to abundance of caution with speculum in setting of recent bleed. Some crusted blood visible deeper within nares. No active bleeding or drainage. Mouth/Oral: Clear oropharynx. Dry mucous membranes. Some mucosal bumps on posterior third of tongue. Neck: Supple. No LAD. Cardiovascular: Tachycardic. Regular rhythm. Normal S1/S2. Systolic 3/6 murmur best heard at RUSB. No gallops or rub. 2+ radial pulses bilaterally. Pulmonary: Clear bilaterally to ascultation. No increased WOB, no accessory muscle usage at rest. No wheezes, crackles, or rhonchi. Abdominal: No tenderness to deep or light palpation. No rebound or guarding. Skin: Cool skin. Cracked, dry skin on palmar surface of hands. Pale face and body. Extremities: Capillary refill 3-4 seconds bilaterally. Extremities mildly cool to touch. No peripheral edema bilaterally.  Results for orders placed or performed in visit on 08/28/22 (from the past 48 hour(s))  Hemoglobin     Status: Abnormal   Collection Time: 08/28/22  3:07 PM  Result Value Ref Range  Hemoglobin 6.0 (A) 11 - 14.6 g/dL    Comment: Result reported to Dr. Arby Barrette    {Show previous vital signs (optional):23777}    ASSESSMENT/PLAN:   Anemia associated with acute blood loss Patient anemic with POCT Hgb 6.0 (baseline 10-11) and hypovolemic in the setting of prolonged epistaxis.  Suspect  etiology of epistaxis is combination of ibuprofen and aspirin use as well as elderly age and recent cold weather.  Possible that Flonase also contributed.  Patient required IV fluids and transfusion for symptomatic anemia and was sent to ED with handoff called in. -Transfusion and workup per ED -Will likely require iron supplementation after treatment -Instructed patient to stop taking aspirin and ibuprofen before next follow-up visit due to bleeding risk -Provided education on importance of seeking emergency care in the event of unrelenting epistaxis  Cyree Chuong, [place]   I was personally present and performed or re-performed the history, physical exam and medical decision making activities of this service and have verified that the service and findings are accurately documented in the student's note.  Shary Key, DO                  08/28/2022, 9:43 PM

## 2022-08-28 NOTE — ED Provider Notes (Signed)
[facility] Provider Note   CSN: [id number] Arrival date & time: 08/28/22  1530     History  Chief Complaint  Patient presents with  . Epistaxis  . Dark Stools    [name] is a 67 y.o. female with T2DM, COPD, Alzheimer's/vascular dementia, mood disorder, GERD, chronic pain syndrome, urge incontinence presents with lightheadedness, epistaxis, melena.   Patient reports a recent nosebleed for 5 days, states she had large clots come out both sides, mostly the left nare.  Has had nosebleeds for her whole life but never when this severe.  She states she was bleeding for 5 days and it stopped on its own 3 days ago.  Since then she endorses hands cramps, lightheaded when she stands/walks, severe fatigue.  No LOC or falls.  Takes no blood thinners. Also over the last several days has had several occurrences of dark stools as well, thinks she was swallowing blood. Endorses mild nausea she thinks because she hasn't eaten today. No CP, SOB, abd pain, urinary symptoms, vaginal bleeding, diarrhea/constipation. She called her PCP to get a referral to ENT and when she heard the story, advised her to go to ED.    Epistaxis      Home Medications Prior to Admission medications   Medication Sig Start Date End Date Taking? Authorizing Provider  Accu-Chek FastClix Lancets MISC USE TO CHECK BLOOD SUGAR THREE TIMES DAILY 12/20/21   Ezequiel Essex, MD  albuterol (PROVENTIL) (2.5 MG/3ML) 0.083% nebulizer solution Take 3 mLs (2.5 mg total) by nebulization every 4 (four) hours as needed for wheezing. 02/17/19   Mullis, Kiersten P, DO  albuterol (VENTOLIN HFA) 108 (90 Base) MCG/ACT inhaler Inhale 1-2 puffs into the lungs every 6 (six) hours as needed for wheezing. 09/10/20   Mullis, Kiersten P, DO  Alcohol Swabs (B-D SINGLE USE SWABS REGULAR) PADS USE  TO  CLEAN  SKIN  BEFORE CHECKING BLOOD SUGAR 09/21/19   Mullis, Kiersten P, DO  aspirin EC 81 MG tablet Take 81 mg by  mouth daily.    [provider]  aspirin-acetaminophen-caffeine (EXCEDRIN MIGRAINE) [id number] MG tablet Take 1 tablet by mouth every 6 (six) hours as needed for headache. Patient not taking: Reported on 08/28/2022    [provider]  atorvastatin (LIPITOR) 10 MG tablet TAKE 1 TABLET (10 MG TOTAL) BY MOUTH DAILY. PLEASE RETURN TO CLINIC FOR CHOLESTEROL CHECK. 05/02/22   Ezequiel Essex, MD  Blood Glucose Monitoring Suppl (ACCU-CHEK GUIDE) w/Device KIT 1 Device by Does not apply route in the morning, at noon, and at bedtime. 09/28/19   Mullis, Kiersten P, DO  Calcium Carbonate-Vitamin D (CALCIUM-D PO) Take 1 tablet by mouth daily. Patient not taking: Reported on 08/28/2022    [provider]  Capsaicin 0.05 % CREA Apply to feet twice daily. 11/30/20   Marzetta Board, DPM  docusate sodium (COLACE) 250 MG capsule Take 250 mg by mouth daily.    [provider]  DULoxetine (CYMBALTA) 60 MG capsule TAKE 1 CAPSULE BY MOUTH EVERY DAY 12/21/21   Ezequiel Essex, MD  empagliflozin (JARDIANCE) 25 MG TABS tablet Take 1 tablet (25 mg total) by mouth daily. 05/13/22   Ezequiel Essex, MD  fluticasone [facility]) 50 MCG/ACT nasal spray SPRAY 2 SPRAYS INTO EACH NOSTRIL EVERY DAY 11/12/20   Mullis, Kiersten P, DO  gabapentin (NEURONTIN) 800 MG tablet Take 800 mg by mouth 3 (three) times daily. 10/18/20 06/26/21  [provider]  glucose blood (ACCU-CHEK  GUIDE) test strip USE  TO CHECK BLOOD SUGAR THREE TIMES DAILY 10/13/19   Mullis, Kiersten P, DO  glucose blood (ACCU-CHEK GUIDE) test strip 3 (three) times daily 12/02/19   [provider]  ibuprofen (ADVIL) 200 MG tablet Take by mouth.    [provider]  ketoconazole (NIZORAL) 2 % shampoo APPLY TO AFFECTED AREA TOPICALLY 2 (TWO) TIMES A WEEK. 07/25/20   Mullis, Kiersten P, DO  loratadine (CLARITIN) 10 MG tablet Take 10 mg by mouth daily.    [provider]  meclizine (ANTIVERT) 12.5 MG tablet TAKE 1  TABLET TWICE DAILY AS NEEDED FOR DIZZINESS. 02/25/22   Ezequiel Essex, MD  metFORMIN (GLUCOPHAGE) 1000 MG tablet Take 1 tablet (1,000 mg total) by mouth 2 (two) times daily with a meal. 08/11/22   Ezequiel Essex, MD  methocarbamol (ROBAXIN) 500 MG tablet Take 500 mg by mouth 3 (three) times daily. 11/19/20   [provider]  Multiple Vitamins-Minerals ([employer] SPECTRAVITE WOMENS SENIOR PO) Take 1 tablet by mouth daily.     [provider]  omeprazole (PRILOSEC) 20 MG capsule TAKE 1 CAPSULE BY MOUTH EVERY DAY IN THE EVENING 03/16/22   Ezequiel Essex, MD  ondansetron (ZOFRAN) 4 MG tablet TAKE 1 TABLET BY MOUTH EVERY 8 HOURS AS NEEDED FOR NAUSEA AND VOMITING 11/15/20   Mullis, Kiersten P, DO  ondansetron (ZOFRAN-ODT) 4 MG disintegrating tablet Take 1 tablet (4 mg total) by mouth every 8 (eight) hours as needed. 05/13/21   Fisher, Linden Dolin, PA-C  oxyCODONE (OXY IR/ROXICODONE) 5 MG immediate release tablet Take 5 mg by mouth every 6 (six) hours as needed. 05/21/21   [provider]  pregabalin (LYRICA) 100 MG capsule Take 100 mg by mouth 3 (three) times daily. 05/06/21   [provider]  triamcinolone (KENALOG) 0.025 % cream Apply 1 application topically 3 (three) times daily as needed (rash). Patient not taking: Reported on 08/28/2022    [provider]  Wound Dressings ([place] ALGINATE 2"X2") PADS APPLY 1 UNITS TOPICALLY ONCE DAILY Patient not taking: Reported on 08/28/2022 10/10/19   [provider]      Allergies    Pregabalin and Hydrocodone-acetaminophen    Review of Systems   Review of Systems  HENT:  Positive for nosebleeds.    Review of systems Negative for LOC.  A 10 point review of systems was performed and is negative unless otherwise reported in HPI.  Physical Exam Updated Vital Signs BP 132/64   Pulse 98   Temp 98.9 F (37.2 C)   Resp (!) 22   SpO2 99%  Physical Exam General: Normal appearing female, lying in bed.  HEENT: PERRLA,  EOMI, +Conjunctival pallor. Sclera anicteric, dry mucous membranes, trachea midline. Clear nares, no dried blood, no active bleeding. Clear oropharynx.  Cardiology: Tachycardic rhythm, regular rate, no murmurs/rubs/gallops. BL radial and DP pulses equal bilaterally.  Resp: Normal respiratory rate and effort. CTAB, no wheezes, rhonchi, crackles.  Abd: Soft, non-tender, non-distended. No rebound tenderness or guarding.  GU: Deferred. MSK: 1+ peripheral edema bilaterally. No signs of trauma. Extremities without deformity or TTP. No cyanosis or clubbing. Skin: Pale. warm, dry. No rashes or lesions. Neuro: A&Ox4, CNs II-XII grossly intact. MAEs. Sensation grossly intact.  Psych: Normal mood and affect.   ED Results / Procedures / Treatments   Labs (all labs ordered are listed, but only abnormal results are displayed) Labs Reviewed  COMPREHENSIVE METABOLIC PANEL - Abnormal; Notable for the following components:  Result Value   Potassium 2.9 (*)    Glucose, Bld 156 (*)    Calcium 8.4 (*)    Total Protein 6.2 (*)    Albumin 2.4 (*)    All other components within normal limits  CBC - Abnormal; Notable for the following components:   RBC 2.06 (*)    Hemoglobin 6.1 (*)    HCT 19.4 (*)    RDW 17.0 (*)    Platelets 129 (*)    nRBC 0.4 (*)    All other components within normal limits  PROTIME-INR - Abnormal; Notable for the following components:   Prothrombin Time 16.9 (*)    INR 1.4 (*)    All other components within normal limits  APTT  HEMOGLOBIN AND HEMATOCRIT, BLOOD  POC OCCULT BLOOD, ED  TYPE AND SCREEN  PREPARE RBC (CROSSMATCH)    EKG None  Radiology No results found.  Procedures Procedures    Medications Ordered in ED Medications  potassium chloride SA (KLOR-CON M) CR tablet 40 mEq (40 mEq Oral Given 08/28/22 1947)  0.9 %  sodium chloride infusion (Manually program via Guardrails IV Fluids) (0 mLs Intravenous Stopped 08/28/22 2211)  acetaminophen (TYLENOL) tablet  1,000 mg (1,000 mg Oral Given 08/28/22 1946)  ondansetron (ZOFRAN) injection 4 mg (4 mg Intravenous Given 08/28/22 1946)    ED Course/ Medical Decision Making/ A&P                          Medical Decision Making Amount and/or Complexity of Data Reviewed Labs: ordered. Decision-making details documented in ED Course.  Risk OTC drugs. Prescription drug management.    This patient presents to the ED for concern of severe epistaxis now resolved w/ fatigue/lightheadedness; this involves an extensive number of treatment options, and is a complaint that carries with it a high risk of complications and morbidity.  I considered the following differential and admission for this acute, potentially life threatening condition.   MDM:    Patient with significant epistaxis reported x 5 days with large clots through bilateral nares that has since stopped, she has no bleeding on exam today.  Reports fatigue and lightheadedness most concerning for acute blood loss anemia as well as hand cramping concerning for hypovolemia or electrolyte derangement, renal injury.  She is hypertensive rather than hypotensive but mildly tachycardic, will hold off on fluids until H&H results. Consider bleeding diathesis such as thrombocytopenia, patient does not take any blood thinners but will check coags.  Patient's melena most likely due to swallowing blood from severe epistaxis, and patient states she has never had that issue before, no abdominal pain, h/o liver disease or PUD. Hgb found to be 6.1. Patient consented for and will start blood transfusion. No active epistaxis. Doubt that patient is actively GI bleeding but will obtain a repeat H&H after her blood transfusion to ensure she increases as expected.  Patient states she has had epistaxis many times before but this was the worst one, patient should see ENT as an outpatient and she is not currently bleeding at this time, no need for emergent ENT intervention.  Clinical  Course as of 08/29/22 0032  Thu Aug 28, 2022  1724 Hemoglobin(!!): 6.1 [HN]  1724 Potassium(!): 2.9 Repleting, will check EKG [HN]  1829 Platelets(!): 129 No active bleeding [HN]  1839 ABO/RH(D): O POS [HN]  2047 INR(!): 1.4 [HN]  2048 Prothrombin Time(!): 16.9 [HN]  2048 APTT: 28 [HN]  Fri Aug 29, 2022  [id number] Patient is instructed that she needs to follow-up with PCP for lab recheck including H&H and potassium within one week, She should also f/u with ENT for her recurrent epistaxis.  I discussed with patient that if she has severe epistaxis again she will need to come to the emergency department and patient reports understanding.  Patient has had no epistaxis during her 8-hour stay in the emergency department. [HN]  [id number] H&H check with Hgb 6.8. Patient is informed and I recommend she be admitted for further blood transfusion and recheck of H&H. [HN]    Clinical Course User Index [HN] Audley Hose, MD    Labs: I Ordered, and personally interpreted labs.  The pertinent results include:  those listed above  Additional history obtained from chart review.  Cardiac Monitoring: .The patient was maintained on a cardiac monitor.  I personally viewed and interpreted the cardiac monitored which showed an underlying rhythm of: sinus tachycardia  Reevaluation: After the interventions noted above, I reevaluated the patient and found that they have :improved  Social Determinants of Health: .Patient lives independently]  Disposition:  pending H&H, likely DC  Co morbidities that complicate the patient evaluation . Past Medical History:  Diagnosis Date  . Allergy   . Arthritis   . COPD (chronic obstructive pulmonary disease) ([place])   . Diabetes mellitus, type 2 ([place])   . Excessive daytime sleepiness 11/12/2017  . GERD (gastroesophageal reflux disease)   . Goiter   . History of kidney stones   . Hypertension   . Kidney stone   . Memory loss   . Mixed Alzheimer's and vascular dementia  ([place]) 08/28/2017  . Osteoporosis    in lower back per pt   . Partial small bowel obstruction ([place]) 01/30/2020  . Plantar fasciitis   . Snoring 08/28/2017  . Stroke [facility]) 15 years ago    residual left sided weakness  . Subacute maxillary sinusitis 05/19/2020  . Tremors of nervous system   . Uterovaginal prolapse, incomplete 06/10/2010   Qualifier: Diagnosis of  By: Zebedee Iba NP, Manuela Schwartz    . Vertigo      Medicines Meds ordered this encounter  Medications  . potassium chloride SA (KLOR-CON M) CR tablet 40 mEq  . 0.9 %  sodium chloride infusion (Manually program via Guardrails IV Fluids)  . acetaminophen (TYLENOL) tablet 1,000 mg  . ondansetron (ZOFRAN) injection 4 mg    I have reviewed the patients home medicines and have made adjustments as needed  Problem List / ED Course: Problem List Items Addressed This Visit       Other   * (Principal) Epistaxis   Hypokalemia   Other Visit Diagnoses     Acute blood loss anemia    -  Primary                   This note was created using dictation software, which may contain spelling or grammatical errors.    Audley Hose, MD 08/29/22 [phone]

## 2022-08-29 ENCOUNTER — Encounter (HOSPITAL_COMMUNITY): Payer: Self-pay | Admitting: Internal Medicine

## 2022-08-29 DIAGNOSIS — R040 Epistaxis: Secondary | ICD-10-CM | POA: Diagnosis not present

## 2022-08-29 DIAGNOSIS — D62 Acute posthemorrhagic anemia: Secondary | ICD-10-CM

## 2022-08-29 DIAGNOSIS — E876 Hypokalemia: Secondary | ICD-10-CM

## 2022-08-29 DIAGNOSIS — K219 Gastro-esophageal reflux disease without esophagitis: Secondary | ICD-10-CM

## 2022-08-29 DIAGNOSIS — J449 Chronic obstructive pulmonary disease, unspecified: Secondary | ICD-10-CM | POA: Diagnosis present

## 2022-08-29 DIAGNOSIS — J309 Allergic rhinitis, unspecified: Secondary | ICD-10-CM | POA: Diagnosis present

## 2022-08-29 DIAGNOSIS — E1142 Type 2 diabetes mellitus with diabetic polyneuropathy: Secondary | ICD-10-CM

## 2022-08-29 DIAGNOSIS — J301 Allergic rhinitis due to pollen: Secondary | ICD-10-CM

## 2022-08-29 HISTORY — DX: Acute posthemorrhagic anemia: D62

## 2022-08-29 LAB — HEMOGLOBIN AND HEMATOCRIT, BLOOD
HCT: 21 % — ABNORMAL LOW (ref 36.0–46.0)
HCT: 23.6 % — ABNORMAL LOW (ref 36.0–46.0)
Hemoglobin: 6.8 g/dL — CL (ref 12.0–15.0)
Hemoglobin: 7.8 g/dL — ABNORMAL LOW (ref 12.0–15.0)

## 2022-08-29 LAB — COMPREHENSIVE METABOLIC PANEL
ALT: 25 U/L (ref 0–44)
AST: 32 U/L (ref 15–41)
Albumin: 2.3 g/dL — ABNORMAL LOW (ref 3.5–5.0)
Alkaline Phosphatase: 97 U/L (ref 38–126)
Anion gap: 8 (ref 5–15)
BUN: 10 mg/dL (ref 8–23)
CO2: 23 mmol/L (ref 22–32)
Calcium: 7.9 mg/dL — ABNORMAL LOW (ref 8.9–10.3)
Chloride: 104 mmol/L (ref 98–111)
Creatinine, Ser: 0.64 mg/dL (ref 0.44–1.00)
GFR, Estimated: >60 mL/min (ref >60)
Glucose, Bld: 116 mg/dL — ABNORMAL HIGH (ref 70–99)
Potassium: 3.4 mmol/L — ABNORMAL LOW (ref 3.5–5.1)
Sodium: 135 mmol/L (ref 135–145)
Total Bilirubin: 1.1 mg/dL (ref 0.3–1.2)
Total Protein: 6.1 g/dL — ABNORMAL LOW (ref 6.5–8.1)

## 2022-08-29 LAB — CBC WITH DIFFERENTIAL/PLATELET
Abs Immature Granulocytes: 0.04 10*3/uL (ref 0.00–0.07)
Basophils Absolute: 0 10*3/uL (ref 0.0–0.1)
Basophils Relative: 1 %
Eosinophils Absolute: 0.2 10*3/uL (ref 0.0–0.5)
Eosinophils Relative: 4 %
HCT: 23.2 % — ABNORMAL LOW (ref 36.0–46.0)
Hemoglobin: 7.9 g/dL — ABNORMAL LOW (ref 12.0–15.0)
Immature Granulocytes: 1 %
Lymphocytes Relative: 33 %
Lymphs Abs: 1.5 10*3/uL (ref 0.7–4.0)
MCH: 30.3 pg (ref 26.0–34.0)
MCHC: 34.1 g/dL (ref 30.0–36.0)
MCV: 88.9 fL (ref 80.0–100.0)
Monocytes Absolute: 0.8 10*3/uL (ref 0.1–1.0)
Monocytes Relative: 17 %
Neutro Abs: 2.1 10*3/uL (ref 1.7–7.7)
Neutrophils Relative %: 44 %
Platelets: 130 10*3/uL — ABNORMAL LOW (ref 150–400)
RBC: 2.61 MIL/uL — ABNORMAL LOW (ref 3.87–5.11)
RDW: 15.9 % — ABNORMAL HIGH (ref 11.5–15.5)
WBC: 4.7 10*3/uL (ref 4.0–10.5)
nRBC: 0 % (ref 0.0–0.2)

## 2022-08-29 LAB — MAGNESIUM: Magnesium: 1.8 mg/dL (ref 1.7–2.4)

## 2022-08-29 LAB — GLUCOSE, CAPILLARY
Glucose-Capillary: 163 mg/dL — ABNORMAL HIGH (ref 70–99)
Glucose-Capillary: 191 mg/dL — ABNORMAL HIGH (ref 70–99)
Glucose-Capillary: 132 mg/dL — ABNORMAL HIGH (ref 70–99)

## 2022-08-29 LAB — IRON AND TIBC
Iron: 20 ug/dL — ABNORMAL LOW (ref 28–170)
Saturation Ratios: 7 % — ABNORMAL LOW (ref 10.4–31.8)
TIBC: 308 ug/dL (ref 250–450)
UIBC: 288 ug/dL

## 2022-08-29 LAB — VITAMIN B12: Vitamin B-12: 617 pg/mL (ref 180–914)

## 2022-08-29 LAB — FOLATE: Folate: >40 ng/mL (ref >5.9)

## 2022-08-29 LAB — FERRITIN: Ferritin: 11 ng/mL (ref 11–307)

## 2022-08-29 LAB — RETICULOCYTES
Immature Retic Fract: 21.7 % — ABNORMAL HIGH (ref 2.3–15.9)
RBC.: 2.61 MIL/uL — ABNORMAL LOW (ref 3.87–5.11)
Retic Count, Absolute: 105.4 10*3/uL (ref 19.0–186.0)
Retic Ct Pct: 4 % — ABNORMAL HIGH (ref 0.4–3.1)

## 2022-08-29 LAB — PREPARE RBC (CROSSMATCH)

## 2022-08-29 MED ORDER — PANTOPRAZOLE SODIUM 40 MG PO TBEC
40.0000 mg | DELAYED_RELEASE_TABLET | Freq: Every day | ORAL | Status: DC
  Administered 2022-08-29: 40 mg via ORAL
  Filled 2022-08-29: qty 1

## 2022-08-29 MED ORDER — ATORVASTATIN CALCIUM 10 MG PO TABS
10.0000 mg | ORAL_TABLET | Freq: Every day | ORAL | Status: DC
  Administered 2022-08-29: 10 mg via ORAL
  Filled 2022-08-29: qty 1

## 2022-08-29 MED ORDER — ALBUTEROL SULFATE (2.5 MG/3ML) 0.083% IN NEBU: 2.5000 mg | INHALATION_SOLUTION | RESPIRATORY_TRACT | Status: DC | PRN

## 2022-08-29 MED ORDER — LORATADINE 10 MG PO TABS
10.0000 mg | ORAL_TABLET | Freq: Every day | ORAL | Status: DC
  Administered 2022-08-29: 10 mg via ORAL
  Filled 2022-08-29: qty 1

## 2022-08-29 MED ORDER — INSULIN ASPART 100 UNIT/ML IJ SOLN
0.0000 [IU] | Freq: Three times a day (TID) | INTRAMUSCULAR | Status: DC
  Administered 2022-08-29: 1 [IU] via SUBCUTANEOUS

## 2022-08-29 MED ORDER — MELATONIN 3 MG PO TABS: 3.0000 mg | ORAL_TABLET | Freq: Every evening | ORAL | Status: DC | PRN

## 2022-08-29 MED ORDER — FERROUS GLUCONATE 324 (38 FE) MG PO TABS: 324.0000 mg | ORAL_TABLET | Freq: Every day | ORAL | 3 refills | Status: DC

## 2022-08-29 MED ORDER — FLUTICASONE PROPIONATE 50 MCG/ACT NA SUSP
1.0000 | Freq: Every day | NASAL | Status: DC
  Filled 2022-08-29: qty 16

## 2022-08-29 MED ORDER — OXYCODONE HCL 5 MG PO TABS
5.0000 mg | ORAL_TABLET | Freq: Four times a day (QID) | ORAL | Status: DC | PRN
  Administered 2022-08-29: 5 mg via ORAL
  Filled 2022-08-29: qty 1

## 2022-08-29 MED ORDER — PREGABALIN 100 MG PO CAPS: 100.0000 mg | ORAL_CAPSULE | Freq: Three times a day (TID) | ORAL | Status: DC

## 2022-08-29 MED ORDER — SODIUM CHLORIDE 0.9 % IV SOLN
510.0000 mg | Freq: Once | INTRAVENOUS | Status: AC
  Administered 2022-08-29: 510 mg via INTRAVENOUS
  Filled 2022-08-29: qty 17

## 2022-08-29 MED ORDER — ACETAMINOPHEN 325 MG PO TABS: 650.0000 mg | ORAL_TABLET | Freq: Four times a day (QID) | ORAL | Status: DC | PRN

## 2022-08-29 MED ORDER — POTASSIUM CHLORIDE CRYS ER 20 MEQ PO TBCR
40.0000 meq | EXTENDED_RELEASE_TABLET | Freq: Once | ORAL | Status: AC
  Administered 2022-08-29: 40 meq via ORAL
  Filled 2022-08-29: qty 2

## 2022-08-29 MED ORDER — ACETAMINOPHEN 650 MG RE SUPP: 650.0000 mg | Freq: Four times a day (QID) | RECTAL | Status: DC | PRN

## 2022-08-29 NOTE — Progress Notes (Signed)
Patient is discharging home, went over the AVS and answered all her questions.  Verbalized understanding.  Removed peripheral  IV x 2, site clear and intact.  No other issues.  Patient's ride will be here in about 20 min as per patient.  Notified the primary nurse.

## 2022-08-29 NOTE — Discharge Instructions (Signed)
Thank you for coming to [facility] Emergency Department. You were seen for fatigue, lightheadedness after severe nosebleed. We did an exam, labs, and imaging, and these showed low hemoglobin and low potassium. You required a blood transfusion.  Please follow up with your primary care provider within 1 week to have your labs rechecked.  Do not hesitate to return to the ED or call 911 if you experience: -Worsening symptoms -Lightheadedness, passing out -Fevers/chills -Anything else that concerns you

## 2022-08-29 NOTE — Progress Notes (Signed)
NEW ADMISSION NOTE New Admission Note:   Arrival Method: Stretcher from ED Mental Orientation: Alert and oriented x4 Telemetry: Tele Box #3 Assessment: Completed Skin: Ecchymosis on left arm and erythema on right abdominal fold IV: Right forearm and Left wrist, both saline locked Pain: 8/10 back pain, given pain medication Tubes: None Safety Measures: Safety Fall Prevention Plan has been given, discussed and implemented. Admission: Completed [facility] Orientation: Patient has been orientated to the room, unit and staff.  Family: Not present at the bedside  Orders have been reviewed and implemented. Will continue to monitor the patient. Call light has been placed within reach and bed alarm has been activated.   Sharmon Revere, RN

## 2022-08-29 NOTE — ED Notes (Signed)
Increased Rate of blood to 12m/hr

## 2022-08-29 NOTE — Discharge Summary (Signed)
Physician Discharge Summary   Patient: [name] MRN: [id number] DOB: Nov 01, 1955  Admit date:     08/28/2022  Discharge date: 08/29/22  Discharge Physician: Estill Cotta, MD    PCP: Ezequiel Essex, MD   Recommendations at discharge:   Started ferrous gluconate 324 mg p.o. daily with breakfast Please obtain CBC at the follow-up appointment  Discharge Diagnoses:    Anemia associated with acute blood loss   Epistaxis-resolved Iron deficiency anemia   Hypokalemia   GERD (gastroesophageal reflux disease)   DM2 (diabetes mellitus, type 2) (HCC)   Acute blood loss anemia   COPD (chronic obstructive pulmonary disease) (HCC)   Allergic rhinitis    Hospital Course:  Patient is a 67 year old female with history of recurrent epistaxis, anemia of chronic disease associated with baseline hemoglobin 10-12, COPD, diabetes mellitus type 2, neuropathy, GERD, allergic rhinitis presented to ED with dizziness and lightheadedness.  Patient reported that she recently experienced 5-days of epistaxis and had completely stopped 3 days ago prior to admission with no internal bleeding since then.  Subsequently she is noted melena 2 days into the course of epistaxis which has also resolved over the last 2 days.  She denied any hematochezia.  No nausea vomiting abdominal discomfort or hematemesis.  Patient reported that she has been experiencing similar nosebleeds throughout most of her life, spontaneously in non traumatic fashion.  The episodes also resolved spontaneously in the absence of any need to see ENT or additional intervention besides intermittent pressure. Over the last 2 to 3 days prior to admission, she noted some mild lightheadedness, dizziness but no vertigo.  No fall, syncope or presyncope.  In ED, hemoglobin was 6.0, patient was admitted for blood transfusion.   Assessment and Plan:  Acute blood loss anemia, iron deficiency anemia, secondary to recurrent epistaxis. -Epistaxis has  completely resolved, patient received 2 units of packed RBCs transfusion -Hemoglobin at discharge 7.8, she declined to have any further transfusion. -Anemia panel showed iron 20, ferritin 11, percent saturation ratio 7, folate 40.0, B12 617 -Patient was given 1 dose of IV Feraheme, started on oral ferrous gluconate daily  History of recurrent epistaxis -Epistaxis has completely resolved, no indication for urgent consultation of ENT at this time.   Hypokalemia -Replaced  COPD -Currently stable, no acute wheezing   Diabetes mellitus type 2, IDDM -Fairly controlled, hemoglobin A1c 5.0 on 05/13/2022 -Continue outpatient regimen        Pain control - [facility] Controlled Substance Reporting System database was reviewed. and patient was instructed, not to drive, operate heavy machinery, perform activities at heights, swimming or participation in water activities or provide baby-sitting services while on Pain, Sleep and Anxiety Medications; until their outpatient Physician has advised to do so again. Also recommended to not to take more than prescribed Pain, Sleep and Anxiety Medications.  Consultants: None Procedures performed: None Disposition: Home Diet recommendation:  Carb modified diet DISCHARGE MEDICATION: Allergies as of 08/29/2022       Reactions   Pregabalin Nausea And Vomiting   Hydrocodone-acetaminophen Nausea And Vomiting        Medication List     STOP taking these medications    ibuprofen 200 MG tablet Commonly known as: ADVIL       TAKE these medications    Accu-Chek FastClix Lancets Misc USE TO CHECK BLOOD SUGAR THREE TIMES DAILY   Accu-Chek Guide test strip Generic drug: glucose blood USE  TO CHECK BLOOD SUGAR THREE TIMES DAILY   Accu-Chek Guide test  strip Generic drug: glucose blood 3 (three) times daily   Accu-Chek Guide w/Device Kit 1 Device by Does not apply route in the morning, at noon, and at bedtime.   albuterol (2.5 MG/3ML)  0.083% nebulizer solution Commonly known as: PROVENTIL Take 3 mLs (2.5 mg total) by nebulization every 4 (four) hours as needed for wheezing.   albuterol 108 (90 Base) MCG/ACT inhaler Commonly known as: VENTOLIN HFA Inhale 1-2 puffs into the lungs every 6 (six) hours as needed for wheezing.   atorvastatin 10 MG tablet Commonly known as: LIPITOR TAKE 1 TABLET (10 MG TOTAL) BY MOUTH DAILY. PLEASE RETURN TO CLINIC FOR CHOLESTEROL CHECK. What changed: additional instructions   B-D SINGLE USE SWABS REGULAR Pads USE  TO  CLEAN  SKIN  BEFORE CHECKING BLOOD SUGAR   CALCIUM-D PO Take 1 tablet by mouth daily.   Capsaicin 0.05 % Crea Apply to feet twice daily. What changed:  how much to take how to take this when to take this reasons to take this additional instructions   [employer] SPECTRAVITE WOMENS SENIOR PO Take 1 tablet by mouth daily.   DULoxetine 60 MG capsule Commonly known as: CYMBALTA TAKE 1 CAPSULE BY MOUTH EVERY DAY What changed: how much to take   empagliflozin 25 MG Tabs tablet Commonly known as: JARDIANCE Take 1 tablet (25 mg total) by mouth daily.   ferrous gluconate 324 MG tablet Commonly known as: FERGON Take 1 tablet (324 mg total) by mouth daily with breakfast.   fluticasone 50 MCG/ACT nasal spray Commonly known as: FLONASE SPRAY 2 SPRAYS INTO EACH NOSTRIL EVERY DAY What changed: See the new instructions.   gabapentin 800 MG tablet Commonly known as: NEURONTIN Take 800 mg by mouth 2 (two) times daily.   ketoconazole 2 % shampoo Commonly known as: NIZORAL APPLY TO AFFECTED AREA TOPICALLY 2 (TWO) TIMES A WEEK. What changed: See the new instructions.   loratadine 10 MG tablet Commonly known as: CLARITIN Take 10 mg by mouth daily.   meclizine 12.5 MG tablet Commonly known as: ANTIVERT TAKE 1 TABLET TWICE DAILY AS NEEDED FOR DIZZINESS. What changed: See the new instructions.   Medihoney Ca Alginate 2"x2" Pads APPLY 1 UNITS TOPICALLY ONCE DAILY    metFORMIN 1000 MG tablet Commonly known as: GLUCOPHAGE Take 1 tablet (1,000 mg total) by mouth 2 (two) times daily with a meal.   methocarbamol 500 MG tablet Commonly known as: ROBAXIN Take 500 mg by mouth in the morning and at bedtime.   omeprazole 20 MG capsule Commonly known as: PRILOSEC TAKE 1 CAPSULE BY MOUTH EVERY DAY IN THE EVENING What changed: See the new instructions.   ondansetron 4 MG tablet Commonly known as: ZOFRAN TAKE 1 TABLET BY MOUTH EVERY 8 HOURS AS NEEDED FOR NAUSEA AND VOMITING What changed: See the new instructions.   oxyCODONE 5 MG immediate release tablet Commonly known as: Oxy IR/ROXICODONE Take 5 mg by mouth every 6 (six) hours as needed for moderate pain.        Follow-up Information     Ezequiel Essex, MD. Schedule an appointment as soon as possible for a visit in 2 week(s).   Specialty: Family Medicine Why: for hospital follow-up, obtain labs, CBC Contact information: [place] [address] [phone]                Discharge Exam: Danley Danker Weights   08/29/22 0355  Weight: 88.1 kg   S: Epistaxis has completely resolved, patient wants to go home,  feels back to her baseline.  Declined any further blood transfusion.  BP 117/60 (BP Location: Right Arm)   Pulse 98   Temp 98 F (36.7 C) (Oral)   Resp 18   Ht 5' 5"$  (1.651 m)   Wt 88.1 kg   SpO2 97%   BMI 32.32 kg/m   Physical Exam General: Alert and oriented x 3, NAD Cardiovascular: S1 S2 clear, RRR.  Respiratory: CTAB, no wheezing, rales or rhonchi Gastrointestinal: Soft, nontender, nondistended, NBS Ext: no pedal edema bilaterally Neuro: no new deficits Skin: No rashes Psych: Normal affect and demeanor, alert and oriented x3    Condition at discharge: fair  The results of significant diagnostics from this hospitalization (including imaging, microbiology, ancillary and laboratory) are listed below for reference.   Imaging Studies: No results  found.  Microbiology: Results for orders placed or performed during the hospital encounter of 01/30/20  SARS Coronavirus 2 by RT PCR (hospital order, performed in [facility] hospital lab) Nasopharyngeal Nasopharyngeal Swab     Status: None   Collection Time: 01/30/20  9:33 PM   Specimen: Nasopharyngeal Swab  Result Value Ref Range Status   SARS Coronavirus 2 NEGATIVE NEGATIVE Final    Comment: (NOTE) SARS-CoV-2 target nucleic acids are NOT DETECTED.  The SARS-CoV-2 RNA is generally detectable in upper and lower respiratory specimens during the acute phase of infection. The lowest concentration of SARS-CoV-2 viral copies this assay can detect is 250 copies / mL. A negative result does not preclude SARS-CoV-2 infection and should not be used as the sole basis for treatment or other patient management decisions.  A negative result may occur with improper specimen collection / handling, submission of specimen other than nasopharyngeal swab, presence of viral mutation(s) within the areas targeted by this assay, and inadequate number of viral copies (<250 copies / mL). A negative result must be combined with clinical observations, patient history, and epidemiological information.  Fact Sheet for Patients:   StrictlyIdeas.no  Fact Sheet for Healthcare Providers: BankingDealers.co.za  This test is not yet approved or  cleared by the [place] FDA and has been authorized for detection and/or diagnosis of SARS-CoV-2 by FDA under an Emergency Use Authorization (EUA).  This EUA will remain in effect (meaning this test can be used) for the duration of the COVID-19 declaration under Section 564(b)(1) of the Act, 21 U.S.C. section 360bbb-3(b)(1), unless the authorization is terminated or revoked sooner.  Performed at [facility], [address] [place], [place] [address]     Labs: CBC: Recent Labs  Lab 08/28/22 1507  08/28/22 1628 08/28/22 2320 08/29/22 0519 08/29/22 1138  WBC  --  4.9  --  4.7  --   NEUTROABS  --   --   --  2.1  --   HGB 6.0* 6.1* 6.8* 7.9* 7.8*  HCT  --  19.4* 21.0* 23.2* 23.6*  MCV  --  94.2  --  88.9  --   PLT  --  129*  --  130*  --    Basic Metabolic Panel: Recent Labs  Lab 08/28/22 1628 08/29/22 0519  NA 138 135  K 2.9* 3.4*  CL 104 104  CO2 24 23  GLUCOSE 156* 116*  BUN 8 10  CREATININE 0.64 0.64  CALCIUM 8.4* 7.9*  MG  --  1.8   Liver Function Tests: Recent Labs  Lab 08/28/22 1628 08/29/22 0519  AST 32 32  ALT 27 25  ALKPHOS 94 97  BILITOT 0.5 1.1  PROT 6.2* 6.1*  ALBUMIN 2.4* 2.3*   CBG: Recent Labs  Lab 08/29/22 0359 08/29/22 0848 08/29/22 1159  GLUCAP 163* 191* 132*    Discharge time spent: greater than 30 minutes.  Signed: Estill Cotta, MD [facility] 08/29/2022

## 2022-08-29 NOTE — TOC Transition Note (Signed)
Transition of Care [facility]) - CM/SW Discharge Note   Patient Details  Name: [name] MRN: [id number] Date of Birth: [birth year]  Transition of Care [facility]) CM/SW Contact:  Tom-Johnson, Renea Ee, RN Phone Number: 08/29/2022, 12:51 PM   Clinical Narrative:     Patient is scheduled for discharge today. No TOC needs or recommendations noted. Denies any needs. Family to transport to discharge. No further TOC needs noted.        Final next level of care: Home/Self Care Barriers to Discharge: Barriers Resolved   Patient Goals and CMS Choice CMS Medicare.gov Compare Post Acute Care list provided to:: Patient Choice offered to / list presented to : NA  Discharge Placement                  Patient to be transferred to facility by: Family      Discharge Plan and Services Additional resources added to the After Visit Summary for                  DME Arranged: N/A DME Agency: NA       HH Arranged: NA HH Agency: NA        Social Determinants of Health (SDOH) Interventions SDOH Screenings   Food Insecurity: No Food Insecurity (08/29/2022)  Housing: Low Risk  (08/29/2022)  Transportation Needs: No Transportation Needs (08/29/2022)  Utilities: Not At Risk (08/29/2022)  Depression (PHQ2-9): Low Risk  (08/28/2022)  Financial Resource Strain: Low Risk  (09/21/2018)  Physical Activity: Insufficiently Active (09/21/2018)  Social Connections: Unknown (09/21/2018)  Stress: No Stress Concern Present (09/21/2018)  Tobacco Use: Low Risk  (08/29/2022)     Readmission Risk Interventions     No data to display

## 2022-08-29 NOTE — ED Notes (Addendum)
ED TO INPATIENT HANDOFF REPORT  ED Nurse Name and Phone #: [phone]  S Name/Age/Gender [name] 67 y.o. female Room/Bed: 005C/005C  Code Status   Code Status: Full Code  Home/SNF/Other Home Patient oriented to: self, place, time, and situation Is this baseline? Yes   Triage Complete: Triage complete  Chief Complaint Acute blood loss anemia [D62]  Triage Note Pt came in via POV from her PCP d/t having nosebleeds for the past 5 days, states they have had large clots come out both sides. Endorses hands cramps, lightheaded & dizziness when she stands. A/Ox4, does NOT take thinners, pt states that 3 days ago she had several occurrences of dark stools as well, denies Emesis.    Allergies Allergies  Allergen Reactions   Pregabalin Nausea And Vomiting   Hydrocodone-Acetaminophen Nausea And Vomiting    Level of Care/Admitting Diagnosis ED Disposition     ED Disposition  Admit   Condition  --   Comment  Hospital Area: [place] [[id number]]  Level of Care: Telemetry Medical [104]  May place patient in observation at [facility] or [place] if equivalent level of care is available:: No  Covid Evaluation: Asymptomatic - no recent exposure (last 10 days) testing not required  Diagnosis: Acute blood loss anemia [id number]  Admitting Physician: Rhetta Mura [id number]  Attending Physician: Rhetta Mura [id number]          B Medical/Surgery History Past Medical History:  Diagnosis Date   Allergy    Arthritis    COPD (chronic obstructive pulmonary disease) ([place])    Diabetes mellitus, type 2 ([place])    Excessive daytime sleepiness 11/12/2017   GERD (gastroesophageal reflux disease)    Goiter    History of kidney stones    Hypertension    Kidney stone    Memory loss    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   Osteoporosis    in lower back per pt    Partial small bowel obstruction ([place]) 01/30/2020   Plantar fasciitis     Snoring 08/28/2017   Stroke ([place]) 15 years ago    residual left sided weakness   Subacute maxillary sinusitis 05/19/2020   Tremors of nervous system    Uterovaginal prolapse, incomplete 06/10/2010   Qualifier: Diagnosis of  By: Zebedee Iba NP, Manuela Schwartz     Vertigo    Past Surgical History:  Procedure Laterality Date   APPENDECTOMY     BLADDER SURGERY     x2   CATARACT EXTRACTION W/PHACO Left 05/06/2017   Procedure: CATARACT EXTRACTION PHACO AND INTRAOCULAR LENS PLACEMENT ([place]) LEFT DIABETIC;  Surgeon: Leandrew Koyanagi, MD;  Location: [place];  Service: Ophthalmology;  Laterality: Left;  Diabetic - oral meds   CATARACT EXTRACTION W/PHACO Right 06/03/2017   Procedure: CATARACT EXTRACTION PHACO AND INTRAOCULAR LENS PLACEMENT ([place]) RIGHT DIABETIC;  Surgeon: Leandrew Koyanagi, MD;  Location: [place];  Service: Ophthalmology;  Laterality: Right;  Diabetic - oral meds   CHOLECYSTECTOMY     COLONOSCOPY  03/14/2016   FOOT SURGERY     KYPHOPLASTY N/A 04/07/2019   Procedure: L4 KYPHOPLASTY;  Surgeon: Hessie Knows, MD;  Location: [facility];  Service: Orthopedics;  Laterality: N/A;   TOTAL ABDOMINAL HYSTERECTOMY     TOTAL KNEE ARTHROPLASTY Left 09/21/2018   Procedure: TOTAL KNEE ARTHROPLASTY-LEFT NEEDS RNFA;  Surgeon: Hessie Knows, MD;  Location: [facility];  Service: Orthopedics;  Laterality: Left;     A IV Location/Drains/Wounds Patient Lines/Drains/Airways Status  Active Line/Drains/Airways     Name Placement date Placement time Site Days   Peripheral IV 08/28/22 20 G Anterior;Right Forearm 08/28/22  1935  Forearm  1   Peripheral IV 08/28/22 22 G Anterior;Left Wrist 08/28/22  1936  Wrist  1   Airway 09/21/18  1005  -- 1438   Incision (Closed) 05/06/17 Eye 05/06/17  0754  -- 1941   Incision (Closed) 06/03/17 Eye 06/03/17  0837  -- 1913   Incision (Closed) 09/21/18 Knee Left 09/21/18  1136  -- 1438   Incision (Closed) 04/07/19 Back Other (Comment) 04/07/19  1235  --  1240            Intake/Output Last 24 hours  Intake/Output Summary (Last 24 hours) at 08/29/2022 0255 Last data filed at 08/29/2022 0251 Gross per 24 hour  Intake 325 ml  Output --  Net 325 ml    Labs/Imaging Results for orders placed or performed during the hospital encounter of 08/28/22 (from the past 48 hour(s))  Comprehensive metabolic panel     Status: Abnormal   Collection Time: 08/28/22  4:28 PM  Result Value Ref Range   Sodium 138 135 - 145 mmol/L   Potassium 2.9 (L) 3.5 - 5.1 mmol/L   Chloride 104 98 - 111 mmol/L   CO2 24 22 - 32 mmol/L   Glucose, Bld 156 (H) 70 - 99 mg/dL    Comment: Glucose reference range applies only to samples taken after fasting for at least 8 hours.   BUN 8 8 - 23 mg/dL   Creatinine, Ser 0.64 0.44 - 1.00 mg/dL   Calcium 8.4 (L) 8.9 - 10.3 mg/dL   Total Protein 6.2 (L) 6.5 - 8.1 g/dL   Albumin 2.4 (L) 3.5 - 5.0 g/dL   AST 32 15 - 41 U/L   ALT 27 0 - 44 U/L   Alkaline Phosphatase 94 38 - 126 U/L   Total Bilirubin 0.5 0.3 - 1.2 mg/dL   GFR, Estimated >60 >60 mL/min    Comment: (NOTE) Calculated using the CKD-EPI Creatinine Equation (2021)    Anion gap 10 5 - 15    Comment: Performed at [facility], [place] [address], [place] [address]  CBC     Status: Abnormal   Collection Time: 08/28/22  4:28 PM  Result Value Ref Range   WBC 4.9 4.0 - 10.5 K/uL   RBC 2.06 (L) 3.87 - 5.11 MIL/uL   Hemoglobin 6.1 (LL) 12.0 - 15.0 g/dL    Comment: REPEATED TO VERIFY THIS CRITICAL RESULT HAS VERIFIED AND BEEN CALLED TO PAIGE PULLIAM, RN BY SHAY EKDAHL ON 02 08 2024 AT 1705, AND HAS BEEN READ BACK.     HCT 19.4 (L) 36.0 - 46.0 %   MCV 94.2 80.0 - 100.0 fL   MCH 29.6 26.0 - 34.0 pg   MCHC 31.4 30.0 - 36.0 g/dL   RDW 17.0 (H) 11.5 - 15.5 %   Platelets 129 (L) 150 - 400 K/uL   nRBC 0.4 (H) 0.0 - 0.2 %    Comment: Performed at [facility] [address], [place], [place] [address]  Type and screen [place]      Status: None (Preliminary result)   Collection Time: 08/28/22  4:28 PM  Result Value Ref Range   ABO/RH(D) O POS    Antibody Screen NEG    Sample Expiration 08/31/2022,2359    Unit Number [id number]    Blood Component Type RED  CELLS,LR    Unit division 00    Status of Unit ISSUED    Transfusion Status OK TO TRANSFUSE    Crossmatch Result Compatible    Unit Number [id number]    Blood Component Type RED CELLS,LR    Unit division 00    Status of Unit ISSUED    Transfusion Status OK TO TRANSFUSE    Crossmatch Result      Compatible Performed at [facility], [place] [address], [place], [place] [address]   Prepare RBC (crossmatch)     Status: None   Collection Time: 08/28/22  7:00 PM  Result Value Ref Range   Order Confirmation      ORDER PROCESSED BY BLOOD BANK Performed at [facility], [place] [address], [place], [place] [address]   Protime-INR     Status: Abnormal   Collection Time: 08/28/22  8:00 PM  Result Value Ref Range   Prothrombin Time 16.9 (H) 11.4 - 15.2 seconds   INR 1.4 (H) 0.8 - 1.2    Comment: (NOTE) INR goal varies based on device and disease states. Performed at [facility], [place] [address], [place], [place] [address]   APTT     Status: None   Collection Time: 08/28/22  8:00 PM  Result Value Ref Range   aPTT 28 24 - 36 seconds    Comment: Performed at [facility], [place] [address] [place], [place] [address]  Hemoglobin and hematocrit, blood     Status: Abnormal   Collection Time: 08/28/22 11:20 PM  Result Value Ref Range   Hemoglobin 6.8 (LL) 12.0 - 15.0 g/dL    Comment: CRITICAL VALUE NOTED.  VALUE IS CONSISTENT WITH PREVIOUSLY REPORTED AND CALLED VALUE. REPEATED TO VERIFY    HCT 21.0 (L) 36.0 - 46.0 %    Comment: Performed at [facility], [place] [address], [place], [place] [address]  Prepare RBC (crossmatch)     Status: None   Collection Time: 08/29/22  1:00 AM  Result Value Ref Range   Order Confirmation      ORDER  PROCESSED BY BLOOD BANK Performed at [facility], [place] [address], [place], [place] [address]    No results found.  Pending Labs Unresulted Labs (From admission, onward)     Start     Ordered   08/29/22 0900  Hemoglobin and hematocrit, blood  Once-Timed,   TIMED        08/29/22 0111   08/29/22 0500  CBC with Differential/Platelet  Tomorrow morning,   R        08/29/22 0053   08/29/22 0500  Comprehensive metabolic panel  Tomorrow morning,   R        08/29/22 0053   08/29/22 0500  Magnesium  Tomorrow morning,   R        08/29/22 0053   08/29/22 0112  Ferritin  Add-on,   AD        08/29/22 0111   08/29/22 0112  Iron and TIBC  Add-on,   AD        08/29/22 0111   08/29/22 0112  Reticulocytes  Add-on,   AD        08/29/22 0111   08/29/22 0112  Vitamin B12  Add-on,   AD        08/29/22 0111   08/29/22 0112  Folate  Add-on,   AD        08/29/22 0111  Vitals/Pain Today's Vitals   08/29/22 0130 08/29/22 0145 08/29/22 0252 08/29/22 0252  BP: (!) 107/58 (!) 107/54  (!) 109/59  Pulse: 96 93  93  Resp: (!) 24 20  20  $ Temp:    98.4 F (36.9 C)  TempSrc:    Oral  SpO2: 99% 99%  99%  PainSc:   0-No pain     Isolation Precautions No active isolations  Medications Medications  acetaminophen (TYLENOL) tablet 650 mg (has no administration in time range)    Or  acetaminophen (TYLENOL) suppository 650 mg (has no administration in time range)  melatonin tablet 3 mg (has no administration in time range)  albuterol (PROVENTIL) (2.5 MG/3ML) 0.083% nebulizer solution 2.5 mg (has no administration in time range)  insulin aspart (novoLOG) injection 0-6 Units (has no administration in time range)  atorvastatin (LIPITOR) tablet 10 mg (has no administration in time range)  fluticasone (FLONASE) 50 MCG/ACT nasal spray 1 spray (has no administration in time range)  loratadine (CLARITIN) tablet 10 mg (has no administration in time range)  pantoprazole (PROTONIX) EC tablet 40  mg (has no administration in time range)  pregabalin (LYRICA) capsule 100 mg (has no administration in time range)  oxyCODONE (Oxy IR/ROXICODONE) immediate release tablet 5 mg (has no administration in time range)  potassium chloride SA (KLOR-CON M) CR tablet 40 mEq (40 mEq Oral Given 08/28/22 1947)  0.9 %  sodium chloride infusion (Manually program via Guardrails IV Fluids) (0 mLs Intravenous Stopped 08/28/22 2211)  acetaminophen (TYLENOL) tablet 1,000 mg (1,000 mg Oral Given 08/28/22 1946)  ondansetron (ZOFRAN) injection 4 mg (4 mg Intravenous Given 08/28/22 1946)    Mobility walks with device (uses cane)     Focused Assessments Anemia : Hemoglobin initially 6.0-repeat 6.1, after 1 unit of RBCs 6.8, then she was given 2nd unit   R Recommendations: See Admitting Provider Note  Report given to:   Additional Notes: Patient will be sent to the floor at 0330, the main time call me if you have question.

## 2022-08-29 NOTE — H&P (Signed)
History and Physical      [name] DOB: [birth year] DOA: 08/28/2022  PCP: Ezequiel Essex, MD  Patient coming from: home   I have personally briefly reviewed patient's old medical records in [place]  Chief Complaint: Lightheadedness  HPI: [name] is a 67 y.o. 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   The patient reports that she recently experienced a 5-day course of epistaxis, which completely stopped 3 days ago, without any interval bleed since that time.  However, over the 5-day course of epistaxis, she reports swallowing a significant amount of blood, and noted new onset melena 2 days into the course of epistaxis, which is now resolved over the last 1 to 2 days.  Denies any associated hematochezia.  Denies any abdominal discomfort, vomiting, or hematemesis.  She has that she has been experiencing similar nosebleeds throughout most of her life, and that these episodes of epistaxis occur in an atraumatic fashion.  The above episode of epistaxis resolve spontaneously, in the absence of any need to see ENT nor any additional intervention aside from intermittent pressure administered via the patient over that timeframe, including now [employer].  Denies any known history of gastrointestinal bleed.  Confirms that she is not on any blood thinners as an outpatient.  Over the last 2 to 3 days, she has noted some mild lightheadedness, dizziness, in the absence of any vertigo.  She voiced that this has not resulted in any fall, presyncope, or syncope.  She also denies any associated chest pain,  palpitations, diaphoresis, nor any shortness of breath.  To review, she has history of chronic anemia, with baseline hemoglobin 10-12, with most recent hemoglobin value occurring in July 2021, at which time it was noted to be 10.9.  Denies any history of underlying liver disease.  No NSAID use.  Denies any routine or recent alcohol consumption.  He is not currently on any oral iron supplementation as an outpatient.     ED Course:  Vital signs in the ED were notable for the following: Afebrile; initial heart rates in the low 100s, socially improving into the low 90s following interval transfusion of 1 unit PVCs.  Blood pressures in the low 100s to 130s; respiratory rate 18-25, oxygen saturation 95 to 100% on room air.  Labs were notable for the following: CMP notable for the following: Potassium 2.9, BUN 8, creatinine 0.64, calcium, just for mild hyperlipidemia noted to be 9.6, evident before, otherwise, liver enzymes within normal limits.  CBC notable for the following: Was of 1400, hemoglobin 6.1 associated normocytic/normochromic properties as well as mild to mildly elevated RDW of 17, and platelet count 129.  The patient subsequently received transfusion 1 unit PRBC, following which updated H&H was noted to increase to 6.8.  Pretransfusion INR 1.4, We will pretransfusion PTT 28.  Per my interpretation, EKG in ED demonstrated the following: EKG shows sinus rhythm with a rate of 96, normal intervals, no evidence of tubular ST changes, continued evidence of ST elevation.  While in the ED, the following were administered: Transfusion 1 unit PBC, acetaminophen 1 g p.o. x 1, Zofran 4 mg IV x 1, potassium chloride 40 mill  colons p.o. x 1.  Subsequently, the patient was admitted for overnight observation for further evaluation and management of symptomatic acute blood loss anemia superimposed on anemia of chronic disease, with presenting labs also notable for hypokalemia.     Review of Systems: As  per HPI otherwise 10 point review of systems negative.   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   Osteoporosis    in lower back per pt    Partial small bowel obstruction ([place]) 01/30/2020   Plantar fasciitis    Snoring 08/28/2017   Stroke ([place]) 15 years ago    residual left sided weakness   Subacute maxillary sinusitis 05/19/2020   Tremors of nervous system    Uterovaginal prolapse, incomplete 06/10/2010   Qualifier: Diagnosis of  By: Zebedee Iba NP, Manuela Schwartz     Vertigo     Past Surgical History:  Procedure Laterality Date   APPENDECTOMY     BLADDER SURGERY     x2   CATARACT EXTRACTION W/PHACO Left 05/06/2017   Procedure: CATARACT EXTRACTION PHACO AND INTRAOCULAR LENS PLACEMENT ([place]) LEFT DIABETIC;  Surgeon: Leandrew Koyanagi, MD;  Location: [place];  Service: Ophthalmology;  Laterality: Left;  Diabetic - oral meds   CATARACT EXTRACTION W/PHACO Right 06/03/2017   Procedure: CATARACT EXTRACTION PHACO AND INTRAOCULAR LENS PLACEMENT ([place]) RIGHT DIABETIC;  Surgeon: Leandrew Koyanagi, MD;  Location: [place];  Service: Ophthalmology;  Laterality: Right;  Diabetic - oral meds   CHOLECYSTECTOMY     COLONOSCOPY  03/14/2016   FOOT SURGERY     KYPHOPLASTY N/A 04/07/2019   Procedure: L4 KYPHOPLASTY;  Surgeon: Hessie Knows, MD;  Location: [facility];  Service: Orthopedics;  Laterality: N/A;   TOTAL ABDOMINAL HYSTERECTOMY     TOTAL KNEE ARTHROPLASTY Left 09/21/2018   Procedure: TOTAL KNEE ARTHROPLASTY-LEFT NEEDS RNFA;  Surgeon: Hessie Knows, MD;  Location: [facility];  Service: Orthopedics;  Laterality: Left;    Social History:  reports  that she has never smoked. She has never been exposed to tobacco smoke. She has never used smokeless tobacco. She reports that she does not drink alcohol and does not use drugs.   Allergies  Allergen Reactions   Pregabalin Nausea And Vomiting   Hydrocodone-Acetaminophen Nausea And Vomiting    Family History  Problem Relation Age of Onset   Healthy Mother    Heart disease Father    Alcohol abuse Father    Alcohol abuse Brother    Heart disease Sister    Diabetes Sister    Breast cancer Daughter    Colon polyps Neg Hx    Colon cancer Neg Hx    Esophageal cancer Neg Hx    Rectal cancer Neg Hx    Stomach cancer Neg Hx     Family history reviewed and not pertinent    Prior to Admission medications   Medication Sig Start Date End Date Taking? Authorizing Provider  Accu-Chek FastClix Lancets MISC USE TO CHECK BLOOD SUGAR THREE TIMES DAILY 12/20/21   Ezequiel Essex, MD  albuterol (PROVENTIL) (2.5 MG/3ML) 0.083% nebulizer solution Take 3 mLs (2.5 mg  total) by nebulization every 4 (four) hours as needed for wheezing. 02/17/19   Mullis, Kiersten P, DO  albuterol (VENTOLIN HFA) 108 (90 Base) MCG/ACT inhaler Inhale 1-2 puffs into the lungs every 6 (six) hours as needed for wheezing. 09/10/20   Mullis, Kiersten P, DO  Alcohol Swabs (B-D SINGLE USE SWABS REGULAR) PADS USE  TO  CLEAN  SKIN  BEFORE CHECKING BLOOD SUGAR 09/21/19   Mullis, Kiersten P, DO  aspirin EC 81 MG tablet Take 81 mg by mouth daily.    [provider]  aspirin-acetaminophen-caffeine (EXCEDRIN MIGRAINE) [phone] MG tablet Take 1 tablet by mouth every 6 (six) hours as needed for headache. Patient not taking: Reported on 08/28/2022    [provider]  atorvastatin (LIPITOR) 10 MG tablet TAKE 1 TABLET (10 MG TOTAL) BY MOUTH DAILY. PLEASE RETURN TO CLINIC FOR CHOLESTEROL CHECK. 05/02/22   Ezequiel Essex, MD  Blood Glucose Monitoring Suppl (ACCU-CHEK GUIDE) w/Device KIT 1 Device by Does not apply route in the  morning, at noon, and at bedtime. 09/28/19   Mullis, Kiersten P, DO  Calcium Carbonate-Vitamin D (CALCIUM-D PO) Take 1 tablet by mouth daily. Patient not taking: Reported on 08/28/2022    [provider]  Capsaicin 0.05 % CREA Apply to feet twice daily. 11/30/20   Marzetta Board, DPM  docusate sodium (COLACE) 250 MG capsule Take 250 mg by mouth daily.    [provider]  DULoxetine (CYMBALTA) 60 MG capsule TAKE 1 CAPSULE BY MOUTH EVERY DAY 12/21/21   Ezequiel Essex, MD  empagliflozin (JARDIANCE) 25 MG TABS tablet Take 1 tablet (25 mg total) by mouth daily. 05/13/22   Ezequiel Essex, MD  fluticasone [facility]) 50 MCG/ACT nasal spray SPRAY 2 SPRAYS INTO EACH NOSTRIL EVERY DAY 11/12/20   Mullis, Kiersten P, DO  gabapentin (NEURONTIN) 800 MG tablet Take 800 mg by mouth 3 (three) times daily. 10/18/20 06/26/21  [provider]  glucose blood (ACCU-CHEK GUIDE) test strip USE  TO CHECK BLOOD SUGAR THREE TIMES DAILY 10/13/19   Mullis, Kiersten P, DO  glucose blood (ACCU-CHEK GUIDE) test strip 3 (three) times daily 12/02/19   [provider]  ibuprofen (ADVIL) 200 MG tablet Take by mouth.    [provider]  ketoconazole (NIZORAL) 2 % shampoo APPLY TO AFFECTED AREA TOPICALLY 2 (TWO) TIMES A WEEK. 07/25/20   Mullis, Kiersten P, DO  loratadine (CLARITIN) 10 MG tablet Take 10 mg by mouth daily.    [provider]  meclizine (ANTIVERT) 12.5 MG tablet TAKE 1 TABLET TWICE DAILY AS NEEDED FOR DIZZINESS. 02/25/22   Ezequiel Essex, MD  metFORMIN (GLUCOPHAGE) 1000 MG tablet Take 1 tablet (1,000 mg total) by mouth 2 (two) times daily with a meal. 08/11/22   Ezequiel Essex, MD  methocarbamol (ROBAXIN) 500 MG tablet Take 500 mg by mouth 3 (three) times daily. 11/19/20   [provider]  Multiple Vitamins-Minerals ([employer] SPECTRAVITE WOMENS SENIOR PO) Take 1 tablet by mouth daily.     [provider]  omeprazole (PRILOSEC) 20 MG capsule TAKE 1 CAPSULE BY MOUTH  EVERY DAY IN THE EVENING 03/16/22   Ezequiel Essex, MD  ondansetron (ZOFRAN) 4 MG tablet TAKE 1 TABLET BY MOUTH EVERY 8 HOURS AS NEEDED FOR NAUSEA AND VOMITING 11/15/20   Mullis, Kiersten P, DO  ondansetron (ZOFRAN-ODT) 4 MG disintegrating tablet Take 1 tablet (4 mg total) by mouth every 8 (eight) hours as needed. 05/13/21   Fisher, Linden Dolin, PA-C  oxyCODONE (OXY IR/ROXICODONE) 5 MG  immediate release tablet Take 5 mg by mouth every 6 (six) hours as needed. 05/21/21   [provider]  pregabalin (LYRICA) 100 MG capsule Take 100 mg by mouth 3 (three) times daily. 05/06/21   [provider]  triamcinolone (KENALOG) 0.025 % cream Apply 1 application topically 3 (three) times daily as needed (rash). Patient not taking: Reported on 08/28/2022    [provider]  Wound Dressings ([place] CA ALGINATE 2"X2") PADS APPLY 1 UNITS TOPICALLY ONCE DAILY Patient not taking: Reported on 08/28/2022 10/10/19   [provider]     Objective    Physical Exam: Vitals:   08/28/22 2315 08/29/22 0000 08/29/22 0045 08/29/22 0101  BP: 112/61 132/64 109/79   Pulse: 89 98 99   Resp: 18 (!) 22 (!) 22   Temp:    98.4 F (36.9 C)  TempSrc:    Oral  SpO2: 99% 99% 98%     General: appears to be stated age; alert, oriented Skin: warm, dry, no rash Head:  AT/[place] Mouth:  Oral mucosa membranes appear moist, normal dentition Neck: supple; trachea midline Heart:  RRR; did not appreciate any M/R/G Lungs: CTAB, did not appreciate any wheezes, rales, or rhonchi Abdomen: + BS; soft, ND, NT Vascular: 2+ pedal pulses b/l; 2+ radial pulses b/l Extremities: no peripheral edema, no muscle wasting Neuro: strength and sensation intact in upper and lower extremities b/l    Labs on Admission: I have personally reviewed following labs and imaging studies  CBC: Recent Labs  Lab 08/28/22 1507 08/28/22 1628 08/28/22 2320  WBC  --  4.9  --   HGB 6.0* 6.1* 6.8*  HCT  --  19.4* 21.0*  MCV  --   94.2  --   PLT  --  129*  --    Basic Metabolic Panel: Recent Labs  Lab 08/28/22 1628  NA 138  K 2.9*  CL 104  CO2 24  GLUCOSE 156*  BUN 8  CREATININE 0.64  CALCIUM 8.4*   GFR: Estimated Creatinine Clearance: 74.9 mL/min (by C-G formula based on SCr of 0.64 mg/dL). Liver Function Tests: Recent Labs  Lab 08/28/22 1628  AST 32  ALT 27  ALKPHOS 94  BILITOT 0.5  PROT 6.2*  ALBUMIN 2.4*   No results for input(s): "LIPASE", "AMYLASE" in the last 168 hours. No results for input(s): "AMMONIA" in the last 168 hours. Coagulation Profile: Recent Labs  Lab 08/28/22 2000  INR 1.4*   Cardiac Enzymes: No results for input(s): "CKTOTAL", "CKMB", "CKMBINDEX", "TROPONINI" in the last 168 hours. BNP (last 3 results) No results for input(s): "PROBNP" in the last 8760 hours. HbA1C: No results for input(s): "HGBA1C" in the last 72 hours. CBG: No results for input(s): "GLUCAP" in the last 168 hours. Lipid Profile: No results for input(s): "CHOL", "HDL", "LDLCALC", "TRIG", "CHOLHDL", "LDLDIRECT" in the last 72 hours. Thyroid Function Tests: No results for input(s): "TSH", "T4TOTAL", "FREET4", "T3FREE", "THYROIDAB" in the last 72 hours. Anemia Panel: No results for input(s): "VITAMINB12", "FOLATE", "FERRITIN", "TIBC", "IRON", "RETICCTPCT" in the last 72 hours. Urine analysis:    Component Value Date/Time   COLORURINE AMBER (A) 01/30/2020 2133   APPEARANCEUR HAZY (A) 01/30/2020 2133   APPEARANCEUR Hazy 10/21/2014 1320   LABSPEC >1.046 (H) 01/30/2020 2133   LABSPEC 1.015 10/21/2014 1320   PHURINE 5.0 01/30/2020 2133   GLUCOSEU NEGATIVE 01/30/2020 2133   GLUCOSEU Negative 10/21/2014 1320   HGBUR NEGATIVE 01/30/2020 2133   HGBUR trace-intact 04/26/2010 1614   BILIRUBINUR NEGATIVE  01/30/2020 2133   BILIRUBINUR Negative 10/21/2014 [place] 01/30/2020 2133   PROTEINUR 30 (A) 01/30/2020 2133   UROBILINOGEN 0.2 08/25/2012 2117   NITRITE NEGATIVE 01/30/2020 2133    LEUKOCYTESUR NEGATIVE 01/30/2020 2133   LEUKOCYTESUR Negative 10/21/2014 1320    Radiological Exams on Admission: No results found.    Assessment/Plan   Principal Problem:   Anemia associated with acute blood loss Active Problems:   Epistaxis   Hypokalemia   GERD (gastroesophageal reflux disease)   DM2 (diabetes mellitus, type 2) (HCC)   Acute blood loss anemia   COPD (chronic obstructive pulmonary disease) (HCC)   Allergic rhinitis    #) Symptomatic acute blood loss anemia superimposed on anemia of chronic disease: Current expiratory manage history of anemia chronic disease with baseline hemoglobin range noted to be 10-12, presenting hemoglobin found to be 6.1, with corresponding elevation in RDW in the setting of recently resolved course of epistaxis.  This is dizziness, lightheadedness, and with vital signs notable for mild tachycardia, which appears to be improving with PRBC transfusion, noting interval increase in hemoglobin from 6.1-6.8 following completion of transfusion of 1 unit previously.  Normotensive blood pressures noted.  Patient reports some melena resolving over the last few days, which appears most consistent with swallowing some blood into the gastrointestinal tract with source suspected to be aforementioned epistaxis.  Clinically, and by [employer], the likelihood of concomitant acute gastrointestinal bleed appears less likely.   However, as the patient remains mildly symptomatic in setting of perpetuation no dizziness, lightheadedness, with residual mild tachycardia following completion of transfusion 1 unit PRBC along with resultant hemoglobin remaining less than 7.0 at this time, the patient is being admitted for overnight observation for further evaluation management of the above, including additional PRBC transfusion, as outlined below.  Plan: Transfuse an additional 1 unit PRBC at this time (this represents the second unit PRBC transfused since presenting to  the emergency department today).  Monitor oximetry.  Monitor strict I's and O's Daily weights.  Add on iron studies, reticulocyte count, [id number], folic acid level.  Repeat CBC in the morning.  Every 4 hours H&H is ordered through 9 AM on 08/29/2022.         #) History of recurrent epistaxis: Patient conveys history of recurrent epistaxis dating back to childhood, with recently resolved episode, which stopped 3 days ago, without any ensuing evidence of residual or recurrent epistaxis.  However, it appears that this episode resulted in presenting acute blood loss anemia superimposed on anemia of chronic disease, as above.  Given 72 hours of resolution of epistaxis, there does not appear to be any indication for urgent consultation of ENT at this time.  Rather, will proceed with evaluation management of resultant acute blood loss anemia superimposed on anemia of chronic disease and as above.  Plan: Further evaluation management of acute colostomy is present.  Crisis, as above, including transfusion of 1 additional unit PRBC along with additional laboratory evaluation of these findings, as further outlined above.  Repeat CBC in the morning.  Refraining from pharmacologic DVT prophylaxis.             #) Hypokalemia: Presenting serum potassium level noted to be 2.9, for which the patient received 40 mill colons of oral potassium chloride administered in the ED.  Plan: Add on serum magnesium.  Monitor oximetry.  Repeat CMP in the morning.              #) COPD: Documented history  thereof, without clinical evidence of acute exacerbation at this time.  outpatient respiratory regimen includes the following:  Prn albuterol inhaler.   Plan: Prn albuterol nebulizer. Check CMP and serum magnesium level in the AM.              #) Type 2 Diabetes Mellitus: documented history of such. Home insulin regimen: None. Home oral hypoglycemic agents: Metformin, and levofloxacin. presenting  blood sugar: 156. Most recent A1c noted to be 5% when checked on 05/13/2022.  It was complicated by diabetic peripheral polyneuropathy, on scheduled Lyrica as an outpatient.   Plan: accuchecks QAC and HS with low dose SSI. hold home oral hypoglycemic agents during this hospitalization.  Continue home Lyrica.            #) GERD: documented h/o such; on omeprazole as outpatient.   Plan: continue home PPI.                 #) Allergic Rhinitis: documented h/o such, on scheduled intranasal Flonase as outpatient, in addition to scheduled loratadine at home.    Plan: cont home Flonase and loratadine.        DVT prophylaxis: SCD's   Code Status: Full code Family Communication: none Disposition Plan: Per Rounding Team Consults called: none;  Admission status: observation     I SPENT GREATER THAN 75  MINUTES IN CLINICAL CARE TIME/MEDICAL DECISION-MAKING IN COMPLETING THIS ADMISSION.      [place] DO [facility]  From [place]   08/29/2022, 1:16 AM

## 2022-08-29 NOTE — Care Management Obs Status (Signed)
[place] NOTIFICATION   Patient Details  Name: RAWAN MISTER MRN: [id number] Date of Birth: 11-01-1955   Medicare Observation Status Notification Given:  Yes    Tom-Johnson, Renea Ee, RN 08/29/2022, 12:50 PM

## 2022-08-30 LAB — TYPE AND SCREEN
ABO/RH(D): O POS
Antibody Screen: NEGATIVE
Unit division: 0
Unit division: 0

## 2022-08-30 LAB — BPAM RBC
Blood Product Expiration Date: 202403082359
Blood Product Expiration Date: 202403092359
ISSUE DATE / TIME: 202402081947
ISSUE DATE / TIME: 202402090056
Unit Type and Rh: 5100
Unit Type and Rh: 5100

## 2022-09-01 ENCOUNTER — Telehealth: Payer: Self-pay

## 2022-09-01 NOTE — Transitions of Care (Post Inpatient/ED Visit) (Signed)
   09/01/2022  Name: [name] MRN: [id number] DOB: [birth year]  Today's TOC FU Call Status: Today's TOC FU Call Status:: Successful TOC FU Call Competed TOC FU Call Complete Date: 09/01/22  Transition Care Management Follow-up Telephone Call Date of Discharge: 08/29/22 Discharge Facility: [facility] Type of Discharge: Inpatient Admission Primary Inpatient Discharge Diagnosis:: anemia How have you been since you were released from the hospital?: Better Any questions or concerns?: No  Items Reviewed: Did you receive and understand the discharge instructions provided?: Yes Medications obtained and verified?: Yes (Medications Reviewed) Any new allergies since your discharge?: No Dietary orders reviewed?: Yes Do you have support at home?: Yes People in Home: child(ren), adult  Home Care and Equipment/Supplies: [place] Ordered?: No Any new equipment or medical supplies ordered?: No  Functional Questionnaire: Do you need assistance with bathing/showering or dressing?: No Do you need assistance with meal preparation?: No Do you need assistance with eating?: No Do you have difficulty maintaining continence: No Do you need assistance with getting out of bed/getting out of a chair/moving?: No Do you have difficulty managing or taking your medications?: No  Folllow up appointments reviewed: PCP Follow-up appointment confirmed?: Yes Date of PCP follow-up appointment?: 09/16/22 Follow-up Provider: Dr Jeani [facility] Follow-up appointment confirmed?: No Do you need transportation to your follow-up appointment?: No Do you understand care options if your condition(s) worsen?: Yes-patient verbalized understanding    [place], Orick [phone]

## 2022-09-04 ENCOUNTER — Emergency Department (HOSPITAL_COMMUNITY)
Admission: EM | Admit: 2022-09-04 | Discharge: 2022-09-05 | Disposition: A | Discharge status: Home / Self Care | Payer: 59 | Attending: Emergency Medicine | Admitting: Emergency Medicine

## 2022-09-04 ENCOUNTER — Other Ambulatory Visit: Payer: Self-pay

## 2022-09-04 DIAGNOSIS — Z8673 Personal history of transient ischemic attack (TIA), and cerebral infarction without residual deficits: Secondary | ICD-10-CM | POA: Insufficient documentation

## 2022-09-04 DIAGNOSIS — E1165 Type 2 diabetes mellitus with hyperglycemia: Secondary | ICD-10-CM | POA: Insufficient documentation

## 2022-09-04 DIAGNOSIS — I10 Essential (primary) hypertension: Secondary | ICD-10-CM | POA: Insufficient documentation

## 2022-09-04 DIAGNOSIS — Z79899 Other long term (current) drug therapy: Secondary | ICD-10-CM | POA: Diagnosis not present

## 2022-09-04 DIAGNOSIS — F0390 Unspecified dementia without behavioral disturbance: Secondary | ICD-10-CM | POA: Diagnosis not present

## 2022-09-04 DIAGNOSIS — J449 Chronic obstructive pulmonary disease, unspecified: Secondary | ICD-10-CM | POA: Diagnosis not present

## 2022-09-04 DIAGNOSIS — R531 Weakness: Secondary | ICD-10-CM | POA: Diagnosis present

## 2022-09-04 DIAGNOSIS — E876 Hypokalemia: Secondary | ICD-10-CM

## 2022-09-04 DIAGNOSIS — Z7951 Long term (current) use of inhaled steroids: Secondary | ICD-10-CM | POA: Insufficient documentation

## 2022-09-04 DIAGNOSIS — Z7984 Long term (current) use of oral hypoglycemic drugs: Secondary | ICD-10-CM | POA: Insufficient documentation

## 2022-09-04 DIAGNOSIS — R197 Diarrhea, unspecified: Secondary | ICD-10-CM | POA: Diagnosis not present

## 2022-09-04 DIAGNOSIS — D6489 Other specified anemias: Secondary | ICD-10-CM | POA: Insufficient documentation

## 2022-09-04 LAB — CBC WITH DIFFERENTIAL/PLATELET
Abs Immature Granulocytes: 0.03 10*3/uL (ref 0.00–0.07)
Basophils Absolute: 0 10*3/uL (ref 0.0–0.1)
Basophils Relative: 1 %
Eosinophils Absolute: 0.1 10*3/uL (ref 0.0–0.5)
Eosinophils Relative: 2 %
HCT: 27.2 % — ABNORMAL LOW (ref 36.0–46.0)
Hemoglobin: 9 g/dL — ABNORMAL LOW (ref 12.0–15.0)
Immature Granulocytes: 0 %
Lymphocytes Relative: 23 %
Lymphs Abs: 1.7 10*3/uL (ref 0.7–4.0)
MCH: 30.2 pg (ref 26.0–34.0)
MCHC: 33.1 g/dL (ref 30.0–36.0)
MCV: 91.3 fL (ref 80.0–100.0)
Monocytes Absolute: 0.8 10*3/uL (ref 0.1–1.0)
Monocytes Relative: 11 %
Neutro Abs: 4.5 10*3/uL (ref 1.7–7.7)
Neutrophils Relative %: 63 %
Platelets: 177 10*3/uL (ref 150–400)
RBC: 2.98 MIL/uL — ABNORMAL LOW (ref 3.87–5.11)
RDW: 18.7 % — ABNORMAL HIGH (ref 11.5–15.5)
WBC: 7.2 10*3/uL (ref 4.0–10.5)
nRBC: 0.6 % — ABNORMAL HIGH (ref 0.0–0.2)

## 2022-09-04 LAB — URINALYSIS, ROUTINE W REFLEX MICROSCOPIC
Bilirubin Urine: NEGATIVE
Glucose, UA: >=500 mg/dL — AB
Ketones, ur: NEGATIVE mg/dL
Leukocytes,Ua: NEGATIVE
Nitrite: NEGATIVE
Protein, ur: NEGATIVE mg/dL
Specific Gravity, Urine: 1.023 (ref 1.005–1.030)
pH: 5 (ref 5.0–8.0)

## 2022-09-04 LAB — COMPREHENSIVE METABOLIC PANEL
ALT: 29 U/L (ref 0–44)
AST: 38 U/L (ref 15–41)
Albumin: 2.4 g/dL — ABNORMAL LOW (ref 3.5–5.0)
Alkaline Phosphatase: 125 U/L (ref 38–126)
Anion gap: 13 (ref 5–15)
BUN: <5 mg/dL — ABNORMAL LOW (ref 8–23)
CO2: 21 mmol/L — ABNORMAL LOW (ref 22–32)
Calcium: 8.5 mg/dL — ABNORMAL LOW (ref 8.9–10.3)
Chloride: 101 mmol/L (ref 98–111)
Creatinine, Ser: 0.7 mg/dL (ref 0.44–1.00)
GFR, Estimated: >60 mL/min (ref >60)
Glucose, Bld: 135 mg/dL — ABNORMAL HIGH (ref 70–99)
Potassium: 2.5 mmol/L — CL (ref 3.5–5.1)
Sodium: 135 mmol/L (ref 135–145)
Total Bilirubin: 1 mg/dL (ref 0.3–1.2)
Total Protein: 6.6 g/dL (ref 6.5–8.1)

## 2022-09-04 LAB — MAGNESIUM: Magnesium: 1.7 mg/dL (ref 1.7–2.4)

## 2022-09-04 LAB — CBG MONITORING, ED: Glucose-Capillary: 145 mg/dL — ABNORMAL HIGH (ref 70–99)

## 2022-09-04 LAB — LIPASE, BLOOD: Lipase: 39 U/L (ref 11–51)

## 2022-09-04 MED ORDER — SODIUM CHLORIDE 0.9 % IV BOLUS (SEPSIS)
1000.0000 mL | Freq: Once | INTRAVENOUS | Status: AC
  Administered 2022-09-05: 1000 mL via INTRAVENOUS

## 2022-09-04 MED ORDER — POTASSIUM CHLORIDE CRYS ER 20 MEQ PO TBCR
40.0000 meq | EXTENDED_RELEASE_TABLET | Freq: Once | ORAL | Status: AC
  Administered 2022-09-05: 40 meq via ORAL
  Filled 2022-09-04: qty 2

## 2022-09-04 MED ORDER — ONDANSETRON 4 MG PO TBDP
4.0000 mg | ORAL_TABLET | Freq: Once | ORAL | Status: AC
  Administered 2022-09-04: 4 mg via ORAL
  Filled 2022-09-04: qty 1

## 2022-09-04 MED ORDER — POTASSIUM CHLORIDE 10 MEQ/100ML IV SOLN
10.0000 meq | INTRAVENOUS | Status: AC
  Administered 2022-09-05 (×3): 10 meq via INTRAVENOUS
  Filled 2022-09-04 (×3): qty 100

## 2022-09-04 NOTE — ED Notes (Signed)
Date and time results received: 09/04/22 2248 (use smartphrase ".now" to insert current time)  Test: Potassium Critical Value: 2.5  Name of Provider Notified: R. Marlon Pel PA-C

## 2022-09-04 NOTE — ED Triage Notes (Signed)
Patient presents with multiple complaints ;Gen. weakness , fatigue , chills , diarrhea , and emesis onset last week .

## 2022-09-04 NOTE — ED Provider Notes (Signed)
10:55 PM K 2.5.  Notified charge Therapist, sports.  Requested room as this appears to be most critical lab in current waiting room.   Montine Circle, PA-C 09/04/22 2255    Ripley Fraise, MD 09/05/22 [phone]

## 2022-09-04 NOTE — ED Provider Triage Note (Signed)
Emergency Medicine Provider Triage Evaluation Note  ADITHI POPKO , a 67 y.o. female  was evaluated in triage.  Pt complains of multiple complaints. She states that she is having diarrhea, chills, weakness and fatigue. She states that she is very nauseated without vomiting. She denies fevers, dysuria. Admitted last week for blood loss anemia from epistaxis.   Review of Systems  Positive: See above Negative:   Physical Exam  BP 125/71 (BP Location: Left Arm)   Pulse 99   Temp 99.2 F (37.3 C) (Oral)   Resp 19   SpO2 96%  Gen:   Awake, no distress   Resp:  Normal effort  MSK:   Moves extremities without difficulty  Other:    Medical Decision Making  Medically screening exam initiated at 8:30 PM.  Appropriate orders placed.  Richmond Campbell was informed that the remainder of the evaluation will be completed by another provider, this initial triage assessment does not replace that evaluation, and the importance of remaining in the ED until their evaluation is complete.     Mickie Hillier, PA-C 09/04/22 2030

## 2022-09-05 LAB — I-STAT CHEM 8, ED
BUN: 5 mg/dL — ABNORMAL LOW (ref 8–23)
Calcium, Ion: 1.08 mmol/L — ABNORMAL LOW (ref 1.15–1.40)
Chloride: 101 mmol/L (ref 98–111)
Creatinine, Ser: 0.4 mg/dL — ABNORMAL LOW (ref 0.44–1.00)
Glucose, Bld: 98 mg/dL (ref 70–99)
HCT: 29 % — ABNORMAL LOW (ref 36.0–46.0)
Hemoglobin: 9.9 g/dL — ABNORMAL LOW (ref 12.0–15.0)
Potassium: 2.9 mmol/L — ABNORMAL LOW (ref 3.5–5.1)
Sodium: 140 mmol/L (ref 135–145)
TCO2: 23 mmol/L (ref 22–32)

## 2022-09-05 LAB — C DIFFICILE QUICK SCREEN W PCR REFLEX
C Diff antigen: NEGATIVE
C Diff interpretation: NOT DETECTED
C Diff toxin: NEGATIVE

## 2022-09-05 MED ORDER — POTASSIUM CHLORIDE CRYS ER 20 MEQ PO TBCR: 20.0000 meq | EXTENDED_RELEASE_TABLET | Freq: Every day | ORAL | 0 refills | Status: DC

## 2022-09-05 NOTE — ED Provider Notes (Signed)
[facility] Provider Note   CSN: [id number] Arrival date & time: 09/04/22  1942     History  Chief Complaint  Patient presents with   Gen. Weakness/Chills/Nausea/Diarrhea    [name] is a 67 y.o. female.  HPI Patient with history of COPD, diabetes presents with generalized weakness and diarrhea.  Patient was she was recently admitted to the hospital for a nosebleed and anemia.  She reports she started having diarrhea at that time.  She reports it stopped for about a day or 2, then she has had multiple episodes over the past several days.  She reports the stool is nonbloody.  She reports nausea but no vomiting.  No abdominal pain.  She reports generalized weakness. She denies any new epistaxis.  She is not on anticoagulation.  No recent antibiotics. 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 Authorizing Provider  potassium chloride SA (KLOR-CON M) 20 MEQ tablet Take 1 tablet (20 mEq total) by mouth daily. 09/05/22  Yes Ripley Fraise, MD   Accu-Chek FastClix Lancets MISC USE TO CHECK BLOOD SUGAR THREE TIMES DAILY 12/20/21   Ezequiel Essex, MD  albuterol (PROVENTIL) (2.5 MG/3ML) 0.083% nebulizer solution Take 3 mLs (2.5 mg total) by nebulization every 4 (four) hours as needed for wheezing. 02/17/19   Mullis, Kiersten P, DO  albuterol (VENTOLIN HFA) 108 (90 Base) MCG/ACT inhaler Inhale 1-2 puffs into the lungs every 6 (six) hours as needed for wheezing. 09/10/20   Mullis, Kiersten P, DO  Alcohol Swabs (B-D SINGLE USE SWABS REGULAR) PADS USE  TO  CLEAN  SKIN  BEFORE CHECKING BLOOD SUGAR 09/21/19   Mullis, Kiersten P, DO  atorvastatin (LIPITOR) 10 MG tablet TAKE 1 TABLET (10 MG TOTAL) BY MOUTH DAILY. PLEASE RETURN TO CLINIC FOR CHOLESTEROL CHECK. Patient taking differently: Take 10 mg by mouth daily. 05/02/22   Ezequiel Essex, MD  Blood Glucose Monitoring Suppl (ACCU-CHEK GUIDE) w/Device KIT 1 Device by Does not apply route in the morning, at noon, and at bedtime. 09/28/19   Mullis, Kiersten P, DO  Calcium Carbonate-Vitamin D (CALCIUM-D PO) Take 1 tablet by mouth daily.    [provider]  Capsaicin 0.05 % CREA Apply to feet twice daily. Patient taking differently: Apply 1 Application topically daily as needed (to feet). 11/30/20   Marzetta Board, DPM  DULoxetine (CYMBALTA) 60 MG capsule TAKE 1 CAPSULE BY MOUTH  EVERY DAY Patient taking differently: Take 60 mg by mouth daily. 12/21/21   Ezequiel Essex, MD  empagliflozin (JARDIANCE) 25 MG TABS tablet Take 1 tablet (25 mg total) by mouth daily. 05/13/22   Ezequiel Essex, MD  ferrous gluconate (FERGON) 324 MG tablet Take 1 tablet (324 mg total) by mouth daily with breakfast. 08/29/22   Rai, Ripudeep K, MD  fluticasone (FLONASE) 50 MCG/ACT nasal spray SPRAY 2 SPRAYS INTO EACH NOSTRIL EVERY DAY Patient taking differently: Place 2 sprays into both nostrils daily. 11/12/20   Mullis, Kiersten P, DO  gabapentin (NEURONTIN) 800 MG tablet Take 800 mg by mouth 2 (two) times daily. 10/18/20 08/29/22   [provider]  glucose blood (ACCU-CHEK GUIDE) test strip USE  TO CHECK BLOOD SUGAR THREE TIMES DAILY 10/13/19   Mullis, Kiersten P, DO  glucose blood (ACCU-CHEK GUIDE) test strip 3 (three) times daily 12/02/19   [provider]  ketoconazole (NIZORAL) 2 % shampoo APPLY TO AFFECTED AREA TOPICALLY 2 (TWO) TIMES A WEEK. Patient taking differently: Apply 1 Application topically 2 (two) times a week. 07/25/20   Mullis, Kiersten P, DO  loratadine (CLARITIN) 10 MG tablet Take 10 mg by mouth daily.    [provider]  meclizine (ANTIVERT) 12.5 MG tablet TAKE 1 TABLET TWICE DAILY AS NEEDED FOR DIZZINESS. Patient taking differently: Take 12.5 mg by mouth every evening. 02/25/22   Ezequiel Essex, MD  metFORMIN (GLUCOPHAGE) 1000 MG tablet Take 1 tablet (1,000 mg total) by mouth 2 (two) times daily with a meal. 08/11/22   Ezequiel Essex, MD  methocarbamol (ROBAXIN) 500 MG tablet Take 500 mg by mouth in the morning and at bedtime. 11/19/20   [provider]  Multiple Vitamins-Minerals ([employer] SPECTRAVITE WOMENS SENIOR PO) Take 1 tablet by mouth daily.     [provider]  omeprazole (PRILOSEC) 20 MG capsule TAKE 1 CAPSULE BY MOUTH EVERY DAY IN THE EVENING Patient taking differently: Take 20 mg by mouth every evening. 03/16/22   Ezequiel Essex, MD  ondansetron (ZOFRAN) 4 MG tablet TAKE 1 TABLET BY MOUTH EVERY 8 HOURS AS NEEDED FOR NAUSEA AND VOMITING Patient taking differently: Take 4 mg by mouth every 8 (eight) hours as needed for vomiting or nausea. 11/15/20   Mullis, Kiersten P, DO  oxyCODONE (OXY IR/ROXICODONE) 5 MG immediate release tablet Take 5 mg by mouth every 6 (six) hours as needed for moderate pain. 05/21/21   [provider]  Wound Dressings ([place] CA ALGINATE 2"X2") PADS  10/10/19   [provider]      Allergies    Pregabalin and Hydrocodone-acetaminophen    Review of Systems   Review of Systems  Constitutional:  Negative for fever.   Cardiovascular:  Positive for palpitations.  Gastrointestinal:  Positive for diarrhea.    Physical Exam Updated Vital Signs BP 136/76 (BP Location: Right Arm)   Pulse 93   Temp 97.6 F (36.4 C) (Oral)   Resp 20   SpO2 99%  Physical Exam CONSTITUTIONAL: Elderly, no acute distress HEAD: Normocephalic/atraumatic EYES: EOMI/PERRL, no icterus ENMT: Mucous membranes dry NECK: supple no meningeal signs SPINE/BACK:entire spine nontender CV: S1/S2 noted, tachycardic LUNGS: Lungs are clear to auscultation bilaterally, no apparent distress ABDOMEN: soft, nontender, no rebound or guarding, bowel sounds noted throughout abdomen GU:no cva tenderness NEURO: Pt is awake/alert/appropriate, moves all extremitiesx4.  No facial droop.  No arm or leg drift EXTREMITIES: pulses normal/equal, full ROM SKIN: warm, color normal PSYCH: no abnormalities of mood noted, alert and oriented  to situation  ED Results / Procedures / Treatments   Labs (all labs ordered are listed, but only abnormal results are displayed) Labs Reviewed  COMPREHENSIVE METABOLIC PANEL - Abnormal; Notable for the following components:      Result Value   Potassium 2.5 (*)    CO2 21 (*)    Glucose, Bld 135 (*)    BUN <5 (*)    Calcium 8.5 (*)    Albumin 2.4 (*)    All other components within normal limits  CBC WITH DIFFERENTIAL/PLATELET - Abnormal; Notable for the following components:   RBC 2.98 (*)    Hemoglobin 9.0 (*)    HCT 27.2 (*)    RDW 18.7 (*)    nRBC 0.6 (*)    All other components within normal limits  URINALYSIS, ROUTINE W REFLEX MICROSCOPIC - Abnormal; Notable for the following components:   APPearance HAZY (*)    Glucose, UA >=500 (*)    Hgb urine dipstick MODERATE (*)    Bacteria, UA FEW (*)    All other components within normal limits  CBG MONITORING, ED - Abnormal; Notable for the following components:   Glucose-Capillary 145 (*)    All other components within normal limits  I-STAT CHEM 8, ED -  Abnormal; Notable for the following components:   Potassium 2.9 (*)    BUN 5 (*)    Creatinine, Ser 0.40 (*)    Calcium, Ion 1.08 (*)    Hemoglobin 9.9 (*)    HCT 29.0 (*)    All other components within normal limits  C DIFFICILE QUICK SCREEN W PCR REFLEX    GASTROINTESTINAL PANEL BY PCR, STOOL (REPLACES STOOL CULTURE)  LIPASE, BLOOD  MAGNESIUM    EKG EKG Interpretation  Date/Time:  Friday September 05 2022 00:25:05 EST Ventricular Rate:  97 PR Interval:  149 QRS Duration: 101 QT Interval:  383 QTC Calculation: 487 R Axis:   10 Text Interpretation: Sinus rhythm Borderline prolonged QT interval Confirmed by Ripley Fraise [id number]) on 09/05/2022 12:30:11 AM  Radiology No results found.  Procedures Procedures    Medications Ordered in ED Medications  potassium chloride 10 mEq in 100 mL IVPB (10 mEq Intravenous New Bag/Given 09/05/22 0435)  ondansetron (ZOFRAN-ODT) disintegrating tablet 4 mg (4 mg Oral Given 09/04/22 2031)  sodium chloride 0.9 % bolus 1,000 mL (0 mLs Intravenous Stopped 09/05/22 0404)  potassium chloride SA (KLOR-CON M) CR tablet 40 mEq (40 mEq Oral Given 09/05/22 0056)    ED Course/ Medical Decision Making/ A&P Clinical Course as of 09/05/22 0528  Fri Sep 05, 2022  0011 Potassium(!!): 2.5 Hypokalemia [DW]  0011 Hemoglobin(!): 9.0 Stable anemia [DW]  0011 Glucose-Capillary(!): 145 Mild hyperglycemia [DW]  0252 Patient stable, receiving potassium infusion [DW]  0527 Overall patient is improved.  She is ambulating without difficulty.  Vitals have been appropriate.  Potassium is improving.  She will also be given a short course as an outpatient but as long as the diarrhea was resolving this will likely improve.  She has PCP follow-up later this month.  Patient is requesting discharge [DW]    Clinical Course User Index [DW] Ripley Fraise, MD                             Medical Decision Making Amount and/or Complexity of Data Reviewed Labs: ordered.  Decision-making details documented in ED Course. ECG/medicine tests: ordered.  Risk Prescription drug management.   This patient  presents to the ED for concern of diarrhea, this involves an extensive number of treatment options, and is a complaint that carries with it a high risk of complications and morbidity.  The differential diagnosis includes but is not limited to ischemic colitis, diverticulitis, enteritis, C. difficile colitis  Comorbidities that complicate the patient evaluation: Patient's presentation is complicated by their history of COPD and diabetes   Additional history obtained: Records reviewed previous admission documents  Lab Tests: I Ordered, and personally interpreted labs.  The pertinent results include: Hypokalemia, hyperglycemia  Cardiac Monitoring: The patient was maintained on a cardiac monitor.  I personally viewed and interpreted the cardiac monitor which showed an underlying rhythm of:  sinus tachycardia  Medicines ordered and prescription drug management: I ordered medication including IV fluids, potassium for dehydration and hypokalemia Reevaluation of the patient after these medicines showed that the patient    improved   Critical Interventions:   potassium infusion  Reevaluation: After the interventions noted above, I reevaluated the patient and found that they have :improved  Complexity of problems addressed: Patient's presentation is most consistent with  acute presentation with potential threat to life or bodily function  Disposition: After consideration of the diagnostic results and the patient's response to treatment,  I feel that the patent would benefit from discharge   .   Patient had recent hypokalemia while in hospital, the recent diarrheal illness likely exacerbated the hypokalemia.  She has responded to fluids and infusion in the ED.  She only had 1 small bowel movement in the ED.  No vomiting.  She is safe for  discharge        Final Clinical Impression(s) / ED Diagnoses Final diagnoses:  Diarrhea, unspecified type  Hypokalemia    Rx / DC Orders ED Discharge Orders          Ordered    potassium chloride SA (KLOR-CON M) 20 MEQ tablet  Daily        09/05/22 0524              Ripley Fraise, MD 09/05/22 [phone]

## 2022-09-07 LAB — GASTROINTESTINAL PANEL BY PCR, STOOL (REPLACES STOOL CULTURE)

## 2022-09-13 ENCOUNTER — Emergency Department (HOSPITAL_COMMUNITY): Payer: 59

## 2022-09-13 ENCOUNTER — Emergency Department (HOSPITAL_BASED_OUTPATIENT_CLINIC_OR_DEPARTMENT_OTHER): Payer: 59

## 2022-09-13 ENCOUNTER — Other Ambulatory Visit: Payer: Self-pay

## 2022-09-13 ENCOUNTER — Encounter (HOSPITAL_COMMUNITY): Payer: Self-pay

## 2022-09-13 ENCOUNTER — Emergency Department (HOSPITAL_COMMUNITY)
Admission: EM | Admit: 2022-09-13 | Discharge: 2022-09-13 | Disposition: A | Discharge status: Home / Self Care | Payer: 59 | Source: Home / Self Care | Attending: Emergency Medicine | Admitting: Emergency Medicine

## 2022-09-13 DIAGNOSIS — J449 Chronic obstructive pulmonary disease, unspecified: Secondary | ICD-10-CM | POA: Insufficient documentation

## 2022-09-13 DIAGNOSIS — Z7984 Long term (current) use of oral hypoglycemic drugs: Secondary | ICD-10-CM | POA: Insufficient documentation

## 2022-09-13 DIAGNOSIS — R0602 Shortness of breath: Secondary | ICD-10-CM | POA: Insufficient documentation

## 2022-09-13 DIAGNOSIS — R52 Pain, unspecified: Secondary | ICD-10-CM

## 2022-09-13 DIAGNOSIS — R0789 Other chest pain: Secondary | ICD-10-CM

## 2022-09-13 DIAGNOSIS — L03116 Cellulitis of left lower limb: Secondary | ICD-10-CM

## 2022-09-13 DIAGNOSIS — K7469 Other cirrhosis of liver: Secondary | ICD-10-CM | POA: Diagnosis not present

## 2022-09-13 DIAGNOSIS — R600 Localized edema: Secondary | ICD-10-CM | POA: Insufficient documentation

## 2022-09-13 DIAGNOSIS — E119 Type 2 diabetes mellitus without complications: Secondary | ICD-10-CM | POA: Insufficient documentation

## 2022-09-13 DIAGNOSIS — Z7951 Long term (current) use of inhaled steroids: Secondary | ICD-10-CM | POA: Insufficient documentation

## 2022-09-13 LAB — BASIC METABOLIC PANEL
Anion gap: 9 (ref 5–15)
BUN: 7 mg/dL — ABNORMAL LOW (ref 8–23)
CO2: 24 mmol/L (ref 22–32)
Calcium: 8.1 mg/dL — ABNORMAL LOW (ref 8.9–10.3)
Chloride: 106 mmol/L (ref 98–111)
Creatinine, Ser: 0.58 mg/dL (ref 0.44–1.00)
GFR, Estimated: >60 mL/min (ref >60)
Glucose, Bld: 105 mg/dL — ABNORMAL HIGH (ref 70–99)
Potassium: 3.3 mmol/L — ABNORMAL LOW (ref 3.5–5.1)
Sodium: 139 mmol/L (ref 135–145)

## 2022-09-13 LAB — CBC
HCT: 30.4 % — ABNORMAL LOW (ref 36.0–46.0)
Hemoglobin: 9.6 g/dL — ABNORMAL LOW (ref 12.0–15.0)
MCH: 30.2 pg (ref 26.0–34.0)
MCHC: 31.6 g/dL (ref 30.0–36.0)
MCV: 95.6 fL (ref 80.0–100.0)
Platelets: 146 10*3/uL — ABNORMAL LOW (ref 150–400)
RBC: 3.18 MIL/uL — ABNORMAL LOW (ref 3.87–5.11)
RDW: 20.5 % — ABNORMAL HIGH (ref 11.5–15.5)
WBC: 5.9 10*3/uL (ref 4.0–10.5)
nRBC: 0 % (ref 0.0–0.2)

## 2022-09-13 LAB — BRAIN NATRIURETIC PEPTIDE: B Natriuretic Peptide: 78.8 pg/mL (ref 0.0–100.0)

## 2022-09-13 LAB — TROPONIN I (HIGH SENSITIVITY)
Troponin I (High Sensitivity): 9 ng/L (ref <18)
Troponin I (High Sensitivity): 7 ng/L (ref <18)

## 2022-09-13 MED ORDER — CEPHALEXIN 500 MG PO CAPS: 500.0000 mg | ORAL_CAPSULE | Freq: Four times a day (QID) | ORAL | 0 refills | Status: DC

## 2022-09-13 MED ORDER — DOXYCYCLINE HYCLATE 100 MG PO CAPS: 100.0000 mg | ORAL_CAPSULE | Freq: Two times a day (BID) | ORAL | 0 refills | Status: DC

## 2022-09-13 MED ORDER — DOXYCYCLINE HYCLATE 100 MG PO TABS
100.0000 mg | ORAL_TABLET | Freq: Once | ORAL | Status: AC
  Administered 2022-09-13: 100 mg via ORAL
  Filled 2022-09-13: qty 1

## 2022-09-13 MED ORDER — CEPHALEXIN 250 MG PO CAPS
500.0000 mg | ORAL_CAPSULE | Freq: Once | ORAL | Status: AC
  Administered 2022-09-13: 500 mg via ORAL
  Filled 2022-09-13: qty 2

## 2022-09-13 MED ORDER — IOHEXOL 350 MG/ML SOLN
75.0000 mL | Freq: Once | INTRAVENOUS | Status: AC | PRN
  Administered 2022-09-13: 75 mL via INTRAVENOUS

## 2022-09-13 NOTE — Discharge Instructions (Addendum)
You have been seen today for your complaint of left leg pain and redness, chest pain, shortness of breath. Your lab work was overall reassuring. Your imaging was reassuring and showed no abnormalities. Your discharge medications include Keflex and doxycycline. This is an antibiotic. You should take it as prescribed. You should take it for the entire duration of the prescription. This may cause an upset stomach. This is normal. You may take this with food. You may also eat yogurt to prevent diarrhea. Home care instructions are as follows:  Monitor your leg for increased redness, pain, or warmth Follow up with: Your primary care provider on Tuesday as scheduled Please seek immediate medical care if you develop any of the following symptoms: You notice red streaks coming from the infected area. You notice the skin turns purple or black and falls off. At this time there does not appear to be the presence of an emergent medical condition, however there is always the potential for conditions to change. Please read and follow the below instructions.  Do not take your medicine if  develop an itchy rash, swelling in your mouth or lips, or difficulty breathing; call 911 and seek immediate emergency medical attention if this occurs.  You may review your lab tests and imaging results in their entirety on your MyChart account.  Please discuss all results of fully with your primary care provider and other specialist at your follow-up visit.  Note: Portions of this text may have been transcribed using voice recognition software. Every effort was made to ensure accuracy; however, inadvertent computerized transcription errors may still be present.

## 2022-09-13 NOTE — ED Provider Notes (Signed)
[facility] Provider Note   CSN: [id number] Arrival date & time: 09/13/22  0321     History  Chief Complaint  Patient presents with   Leg Swelling    [name] is a 67 y.o. female.  HPI   Patient with medical history including epistasis, anemia, COPD, diabetes, presenting with complaints of worsening leg swelling and shortness of breath.  Patient states that this happened after she was discharged from the hospital on the ninth, states that her left leg has become more swollen than usual, states it feels tight, she denies any trauma to the area, no paresthesia or weakness moving down the leg.  She states that she feels slightly more short of breath, this is mainly with exertion, states she will feel a small pain around her left breast, does not radiate, there is no associated lightheaded dizziness or near syncope.  No PEs or DVTs currently not on hormone therapy, no significant cardiac history.  Reviewed patient's chart was recent admitted to the hospital and discharged on 02/09 for symptomatic anemia secondary to epistasis, received a unit of blood and was discharged home to follow-up with ENT.  Patient has had echocardiogram performed last year which was negative for evidence of CHF.      Home Medications Prior to Admission medications   Medication Sig Start Date End Date Taking? Authorizing Provider  Accu-Chek FastClix Lancets MISC USE TO CHECK BLOOD SUGAR THREE TIMES DAILY 12/20/21   Ezequiel Essex, MD  albuterol (PROVENTIL) (2.5 MG/3ML) 0.083% nebulizer solution Take 3 mLs (2.5 mg total) by nebulization every 4 (four) hours as needed for wheezing. 02/17/19   Mullis, Kiersten P, DO  albuterol (VENTOLIN HFA) 108 (90 Base) MCG/ACT inhaler Inhale 1-2 puffs into the lungs every 6 (six) hours as needed for wheezing. 09/10/20   Mullis, Kiersten P, DO  Alcohol Swabs (B-D SINGLE USE SWABS REGULAR) PADS USE  TO  CLEAN  SKIN  BEFORE CHECKING  BLOOD SUGAR 09/21/19   Mullis, Kiersten P, DO  atorvastatin (LIPITOR) 10 MG tablet TAKE 1 TABLET (10 MG TOTAL) BY MOUTH DAILY. PLEASE RETURN TO CLINIC FOR CHOLESTEROL CHECK. Patient taking differently: Take 10 mg by mouth daily. 05/02/22   Ezequiel Essex, MD  Blood Glucose Monitoring Suppl (ACCU-CHEK GUIDE) w/Device KIT 1 Device by Does not apply route in the morning, at noon, and at bedtime. 09/28/19   Mullis, Kiersten P, DO  Calcium Carbonate-Vitamin D (CALCIUM-D PO) Take 1 tablet by mouth daily.    [provider]  Capsaicin 0.05 % CREA Apply to feet twice daily. Patient taking differently: Apply 1 Application topically daily as needed (to feet). 11/30/20   Marzetta Board, DPM  DULoxetine (CYMBALTA) 60 MG capsule TAKE 1 CAPSULE BY MOUTH EVERY DAY Patient taking differently: Take 60 mg by mouth daily. 12/21/21   Ezequiel Essex, MD  empagliflozin (JARDIANCE) 25 MG TABS tablet Take 1 tablet (25 mg total) by mouth daily. 05/13/22   Ezequiel Essex, MD  ferrous gluconate (FERGON) 324 MG tablet Take 1 tablet (324 mg total) by mouth daily with breakfast. 08/29/22   Rai, Ripudeep K, MD  fluticasone (FLONASE) 50 MCG/ACT nasal spray SPRAY 2 SPRAYS INTO EACH NOSTRIL EVERY DAY Patient taking differently: Place 2 sprays into both nostrils daily. 11/12/20   Mullis, Kiersten P, DO  gabapentin (NEURONTIN) 800 MG tablet Take 800 mg by mouth 2 (two) times daily. 10/18/20 08/29/22  [provider]  glucose blood (ACCU-CHEK GUIDE) test strip  USE  TO CHECK BLOOD SUGAR THREE TIMES DAILY 10/13/19   Mullis, Kiersten P, DO  glucose blood (ACCU-CHEK GUIDE) test strip 3 (three) times daily 12/02/19   [provider]  ketoconazole (NIZORAL) 2 % shampoo APPLY TO AFFECTED AREA TOPICALLY 2 (TWO) TIMES A WEEK. Patient taking differently: Apply 1 Application topically 2 (two) times a week. 07/25/20   Mullis, Kiersten P, DO  loratadine (CLARITIN) 10 MG tablet Take 10 mg by mouth daily.    [provider]  meclizine (ANTIVERT) 12.5 MG tablet TAKE 1 TABLET TWICE DAILY AS NEEDED FOR DIZZINESS. Patient taking differently: Take 12.5 mg by mouth every evening. 02/25/22   Ezequiel Essex, MD  metFORMIN (GLUCOPHAGE) 1000 MG tablet Take 1 tablet (1,000 mg total) by mouth 2 (two) times daily with a meal. 08/11/22   Ezequiel Essex, MD  methocarbamol (ROBAXIN) 500 MG tablet Take 500 mg by mouth in the morning and at bedtime. 11/19/20   [provider]  Multiple Vitamins-Minerals ([employer] SPECTRAVITE WOMENS SENIOR PO) Take 1 tablet by mouth daily.     [provider]  omeprazole (PRILOSEC) 20 MG capsule TAKE 1 CAPSULE BY MOUTH EVERY DAY IN THE EVENING Patient taking differently: Take 20 mg by mouth every evening. 03/16/22   Ezequiel Essex, MD  ondansetron (ZOFRAN) 4 MG tablet TAKE 1 TABLET BY MOUTH EVERY 8 HOURS AS NEEDED FOR NAUSEA AND VOMITING Patient taking differently: Take 4 mg by mouth every 8 (eight) hours as needed for vomiting or nausea. 11/15/20   Mullis, Kiersten P, DO  oxyCODONE (OXY IR/ROXICODONE) 5 MG immediate release tablet Take 5 mg by mouth every 6 (six) hours as needed for moderate pain. 05/21/21   [provider]  potassium chloride SA (KLOR-CON M) 20 MEQ tablet Take 1 tablet (20 mEq total) by mouth daily. 09/05/22   Ripley Fraise, MD  Wound Dressings ([place] ALGINATE 2"X2") PADS  10/10/19   [provider]      Allergies    Pregabalin and Hydrocodone-acetaminophen    Review of Systems   Review of Systems  Constitutional:  Negative for chills and fever.  Respiratory:  Positive for shortness of breath.   Cardiovascular:  Positive for leg swelling. Negative for chest pain.  Gastrointestinal:  Negative for abdominal pain.  Neurological:  Negative for headaches.    Physical Exam Updated Vital Signs BP 132/71   Pulse 96   Temp 98.4 F (36.9 C) (Oral)   Resp 19   Ht '5\' 4"'$  (1.626 m)   Wt 90.7 kg   SpO2 99%   BMI 34.33 kg/m  Physical  Exam Vitals and nursing note reviewed.  Constitutional:      General: She is not in acute distress.    Appearance: She is not ill-appearing.  HENT:     Head: Normocephalic and atraumatic.     Nose: No congestion.  Eyes:     Conjunctiva/sclera: Conjunctivae normal.  Cardiovascular:     Rate and Rhythm: Normal rate and regular rhythm.     Pulses: Normal pulses.     Heart sounds: No murmur heard.    No friction rub. No gallop.  Pulmonary:     Effort: Pulmonary effort is normal. No respiratory distress.     Breath sounds: No wheezing or rales.  Chest:     Chest wall: No tenderness.  Abdominal:     General: There is no distension.     Palpations: Abdomen is soft.     Tenderness: There is  no abdominal tenderness. There is no right CVA tenderness or left CVA tenderness.     Comments: Abdomen nondistended, soft, nontender to palpation.  Underneath patient's pannus there is evidence of fungal infection, there is slight erythema without drainage or discharge, area is slightly tender but no fluctuance or induration noted.  Musculoskeletal:     Right lower leg: No edema.     Left lower leg: Edema present.     Comments: Patient is noted unilateral leg swelling, left leg greater than the right leg, she has 2+ pitting edema up to the knee, there is no palpable cords, no calf tenderness, no evidence of ischemic leg, no lymphedema skin changes present, she has 2+ dorsal pedal pulses, sensation tact to light touch.  Skin:    General: Skin is warm and dry.  Neurological:     Mental Status: She is alert.  Psychiatric:        Mood and Affect: Mood normal.     ED Results / Procedures / Treatments   Labs (all labs ordered are listed, but only abnormal results are displayed) Labs Reviewed  BASIC METABOLIC PANEL - Abnormal; Notable for the following components:      Result Value   Potassium 3.3 (*)    Glucose, Bld 105 (*)    BUN 7 (*)    Calcium 8.1 (*)    All other components within normal  limits  CBC - Abnormal; Notable for the following components:   RBC 3.18 (*)    Hemoglobin 9.6 (*)    HCT 30.4 (*)    RDW 20.5 (*)    Platelets 146 (*)    All other components within normal limits  BRAIN NATRIURETIC PEPTIDE  TROPONIN I (HIGH SENSITIVITY)  TROPONIN I (HIGH SENSITIVITY)    EKG None  Radiology DG Chest 2 View  Result Date: 09/13/2022 CLINICAL DATA:  Shortness of breath and lower extremity swelling. EXAM: CHEST - 2 VIEW COMPARISON:  Thoracic spine series 01/01/2022, chest PA Lat 05/24/2020. FINDINGS: There is interval low inspiration with increased linear atelectasis in the bases. No focal pneumonia is seen or pleural effusion. There is calcification in the transverse aorta with normal cardiomediastinal silhouette. Generalized osteopenia. There is a vertebra plana compression fracture of T7, markedly worsened since thoracic spine films 01/01/2022 when the loss of vertebral height was about 30%. There is mild but increased thoracic kyphodextroscoliosis associated. IMPRESSION: 1. Low inspiration with increased linear atelectasis in the bases. 2. No evidence of CHF or pneumonia. 3. Osteopenia with vertebra plana compression fracture of T7, markedly worsened since 01/01/2022. Electronically Signed   By: Telford Nab M.D.   On: 09/13/2022 04:17    Procedures Procedures    Medications Ordered in ED Medications - No data to display  ED Course/ Medical Decision Making/ A&P                             Medical Decision Making Amount and/or Complexity of Data Reviewed Labs: ordered. Radiology: ordered.   This patient presents to the ED for concern of leg swelling shortness of breath, this involves an extensive number of treatment options, and is a complaint that carries with it a high risk of complications and morbidity.  The differential diagnosis includes PE, DVT, CHF, cellulitis,    Additional history obtained:  Additional history obtained from N/A External records  from outside source obtained and reviewed including recent hospitalization, cardiology notes   Co morbidities that  complicate the patient evaluation  COPD  Social Determinants of Health:  N/A    Lab Tests:  I Ordered, and personally interpreted labs.  The pertinent results include: CBC shows stable normocytic anemia hemoglobin 9.6, BMP reveals potassium 3.3, glucose 107, BNP 78, for troponin is 9   Imaging Studies ordered:  I ordered imaging studies including chest x-ray, CTA chest, DVT study I independently visualized and interpreted imaging which showed chest x-ray negative I agree with the radiologist interpretation   Cardiac Monitoring:  The patient was maintained on a cardiac monitor.  I personally viewed and interpreted the cardiac monitored which showed an underlying rhythm of: EKG without signs of ischemia   Medicines ordered and prescription drug management:  I ordered medication including N/A I have reviewed the patients home medicines and have made adjustments as needed  Critical Interventions:  N/A   Reevaluation:  Presents with complaints of shortness of breath and leg swelling, triage obtain basic lab work imaging which I personally reviewed, unremarkable, on my exam she does have notable left leg swelling, and she is tachycardic during my exam, I am concerned for possible DVT as well as a PE, will add on CTA of chest as well as DVT study.    Consultations Obtained:  N/A    Test Considered:  N/A    Rule out I have low suspicion for ACS as EKG was  without signs of ischemia, first troponin is negative, second troponin is pending. Low suspicion for AAA or aortic dissection as history is atypical, patient has low risk factors.  Suspicion for CHF exacerbation is also low at this time as she does not appear to be volume overloaded my exam, she has no pleural effusion seen on chest x-ray, BNP is within normal limits.   low suspicion for systemic  infection as patient is nontoxic-appearing, vital signs reassuring, no obvious source infection noted on exam.     Dispostion and problem list  Due to shift change patient may handoff to Sheppard Coil schutt [facility]  Follow-up on CTA of chest as well as DVT study if both are negative patient can be discharged home.  Follow-up with PCP.            Final Clinical Impression(s) / ED Diagnoses Final diagnoses:  Leg edema  SOB (shortness of breath)    Rx / DC Orders ED Discharge Orders     None         Marcello Fennel, PA-C 09/13/22 [id number]    Randal Buba, April, MD 09/13/22 Vernon, April, MD 09/13/22 [id number]

## 2022-09-13 NOTE — ED Notes (Addendum)
Pt verbalized understanding of discharge instructions. Opportunity for questions provided.   Pt son and daughter-in-law called for transport.

## 2022-09-13 NOTE — ED Provider Notes (Signed)
Physical Exam  BP 139/66   Pulse 95   Temp 98.3 F (36.8 C) (Oral)   Resp 20   Ht '5\' 4"'$  (1.626 m)   Wt 90.7 kg   SpO2 99%   BMI 34.33 kg/m   Physical Exam Vitals and nursing note reviewed.  Constitutional:      General: She is not in acute distress.    Appearance: She is well-developed. She is obese. She is not ill-appearing, toxic-appearing or diaphoretic.     Comments: Resting comfortably in bed  HENT:     Head: Normocephalic and atraumatic.  Eyes:     Conjunctiva/sclera: Conjunctivae normal.  Cardiovascular:     Rate and Rhythm: Normal rate and regular rhythm.     Heart sounds: Murmur (reports this is not new) heard.  Pulmonary:     Effort: Pulmonary effort is normal. No respiratory distress.     Breath sounds: Normal breath sounds.  Abdominal:     Palpations: Abdomen is soft.     Tenderness: There is no abdominal tenderness. There is no guarding.  Musculoskeletal:        General: No swelling.     Cervical back: Neck supple.     Left lower leg: Edema present.  Skin:    General: Skin is warm and dry.     Capillary Refill: Capillary refill takes less than 2 seconds.     Comments: Mild 3 cm x 4 cm area of erythema to the medial aspect of the left ankle  Neurological:     General: No focal deficit present.     Mental Status: She is alert and oriented to person, place, and time.  Psychiatric:        Mood and Affect: Mood normal.     Procedures  Procedures  ED Course / MDM   Clinical Course as of 09/13/22 1159  Sat Sep 13, 7550  1618 67 year old female, complains of left leg swelling, chest pain and shortness of breath.  Has had TKA of the left leg.  Plan to rule out DVT and PE.  Appears cellulitic on the left leg with nearby total knee replacement. Plan pending DVT and PE studies [AS]    Clinical Course User Index [AS] Adisen Bennion, [place] Folk, PA-C   Medical Decision Making Amount and/or Complexity of Data Reviewed Labs: ordered. Radiology:  ordered.  Risk Prescription drug management.  Assumed care at shift change from old [place], [place].  Please see his note for full HPI.  67 year old female presenting to the ED for evaluation of chest pain, shortness of breath, left leg pain.  Her main concern is her left leg pain.  She states she has had some intermittent exertional and nonexertional chest pain for the past 4 to 5 days.  States it is moderate in intensity when she does feel it and lasts for a few minutes before resolving spontaneously.  Her shortness of breath is with exertion.  She denies associated dizziness, headedness, nausea, vomiting, diaphoresis, cough.  States the left lower extremity is more swollen than normal currently.  States that she gets erythema of the left ankle fairly often which comes and goes.  Describes her leg pain as a burning sensation.  0940-CT PE study negative.  DVT study negative.  No leukocytosis or fever.  Stable anemia of 9.6. heart rate 95-100 which is baseline for patient.  Initial delta troponin unremarkable.  EKG shows no signs of ischemia.  Heart score 3 and low suspicion for ACS.  Patient states  her erythema may be secondary to her diabetes.  She was presented with the option of being admitted for IV antibiotics versus discharge home with oral antibiotics and close follow-up with family medicine.  She does have an appointment with her primary care provider on Tuesday.  1050-patient feels very strongly against being admitted.  Will give her first dose of Keflex and doxycycline in the ED.  She was given very strict and very clear return precautions.  She was encouraged to keep her appointments with her primary care provider on Tuesday and to take her medications as prescribed.  Stable at discharge.  At this time there does not appear to be any evidence of an acute emergency medical condition and the patient appears stable for discharge with appropriate outpatient follow up. Diagnosis was discussed with  patient who verbalizes understanding of care plan and is agreeable to discharge. I have discussed return precautions with patient who verbalizes understanding. Patient encouraged to follow-up with their PCP as scheduled. All questions answered.  Note: Portions of this report may have been transcribed using voice recognition software. Every effort was made to ensure accuracy; however, inadvertent computerized transcription errors may still be present.   Nehemiah Massed 09/13/22 1224    Dorie Rank, MD 09/14/22 [phone]

## 2022-09-13 NOTE — ED Notes (Signed)
Pt able to ambulate to restroom w/ cane and standby assist.    Prior to ambulation, Pt's BLE edema reassessed.

## 2022-09-13 NOTE — ED Triage Notes (Signed)
Pt reports she has noticed swelling to bilateral feet/legs as well as her abdomen associated with exertional shortness of breath. She states she noticed it last week. She also is reporting abd pain. Was recently seen here and admitted for diarrhea and hypokalemia.

## 2022-09-13 NOTE — ED Notes (Signed)
Pt transported to imaging.

## 2022-09-13 NOTE — Progress Notes (Signed)
VASCULAR LAB    Left lower extremity venous duplex has been performed.  See CV proc for preliminary results.  Messaged results to Georgiana Spinner, PA-C via secure chat  Sharion Dove, RVT 09/13/2022, 9:25 AM

## 2022-09-16 ENCOUNTER — Encounter: Payer: Self-pay | Admitting: Family Medicine

## 2022-09-16 ENCOUNTER — Inpatient Hospital Stay (HOSPITAL_COMMUNITY): Payer: 59

## 2022-09-16 ENCOUNTER — Encounter (HOSPITAL_COMMUNITY): Payer: Self-pay

## 2022-09-16 ENCOUNTER — Ambulatory Visit (INDEPENDENT_AMBULATORY_CARE_PROVIDER_SITE_OTHER): Payer: 59 | Admitting: Family Medicine

## 2022-09-16 ENCOUNTER — Other Ambulatory Visit: Payer: Self-pay

## 2022-09-16 ENCOUNTER — Inpatient Hospital Stay (HOSPITAL_COMMUNITY)
Admission: AD | Admit: 2022-09-16 | Discharge: 2022-09-18 | DRG: 433 | Disposition: A | Discharge status: Home / Self Care | Payer: 59 | Source: Ambulatory Visit | Attending: Family Medicine | Admitting: Family Medicine

## 2022-09-16 ENCOUNTER — Encounter (HOSPITAL_COMMUNITY): Payer: Self-pay | Admitting: Student

## 2022-09-16 VITALS — BP 122/68 | HR 88 | Ht 64.0 in | Wt 217.4 lb

## 2022-09-16 DIAGNOSIS — K219 Gastro-esophageal reflux disease without esophagitis: Secondary | ICD-10-CM | POA: Diagnosis present

## 2022-09-16 DIAGNOSIS — E8779 Other fluid overload: Secondary | ICD-10-CM | POA: Diagnosis not present

## 2022-09-16 DIAGNOSIS — J449 Chronic obstructive pulmonary disease, unspecified: Secondary | ICD-10-CM | POA: Diagnosis present

## 2022-09-16 DIAGNOSIS — M7989 Other specified soft tissue disorders: Secondary | ICD-10-CM | POA: Diagnosis present

## 2022-09-16 DIAGNOSIS — Z6836 Body mass index (BMI) 36.0-36.9, adult: Secondary | ICD-10-CM | POA: Diagnosis not present

## 2022-09-16 DIAGNOSIS — E1140 Type 2 diabetes mellitus with diabetic neuropathy, unspecified: Secondary | ICD-10-CM | POA: Diagnosis present

## 2022-09-16 DIAGNOSIS — Z7984 Long term (current) use of oral hypoglycemic drugs: Secondary | ICD-10-CM | POA: Diagnosis not present

## 2022-09-16 DIAGNOSIS — Z79899 Other long term (current) drug therapy: Secondary | ICD-10-CM | POA: Diagnosis not present

## 2022-09-16 DIAGNOSIS — F39 Unspecified mood [affective] disorder: Secondary | ICD-10-CM | POA: Diagnosis present

## 2022-09-16 DIAGNOSIS — I071 Rheumatic tricuspid insufficiency: Secondary | ICD-10-CM | POA: Diagnosis present

## 2022-09-16 DIAGNOSIS — Z7951 Long term (current) use of inhaled steroids: Secondary | ICD-10-CM

## 2022-09-16 DIAGNOSIS — Z811 Family history of alcohol abuse and dependence: Secondary | ICD-10-CM | POA: Diagnosis not present

## 2022-09-16 DIAGNOSIS — Z96652 Presence of left artificial knee joint: Secondary | ICD-10-CM | POA: Diagnosis present

## 2022-09-16 DIAGNOSIS — R188 Other ascites: Secondary | ICD-10-CM | POA: Diagnosis present

## 2022-09-16 DIAGNOSIS — G894 Chronic pain syndrome: Secondary | ICD-10-CM | POA: Diagnosis present

## 2022-09-16 DIAGNOSIS — Z86010 Personal history of colonic polyps: Secondary | ICD-10-CM | POA: Diagnosis not present

## 2022-09-16 DIAGNOSIS — K7469 Other cirrhosis of liver: Principal | ICD-10-CM | POA: Diagnosis present

## 2022-09-16 DIAGNOSIS — Z833 Family history of diabetes mellitus: Secondary | ICD-10-CM | POA: Diagnosis not present

## 2022-09-16 DIAGNOSIS — Z8249 Family history of ischemic heart disease and other diseases of the circulatory system: Secondary | ICD-10-CM

## 2022-09-16 DIAGNOSIS — E8770 Fluid overload, unspecified: Secondary | ICD-10-CM | POA: Diagnosis present

## 2022-09-16 DIAGNOSIS — R011 Cardiac murmur, unspecified: Secondary | ICD-10-CM | POA: Diagnosis not present

## 2022-09-16 DIAGNOSIS — I350 Nonrheumatic aortic (valve) stenosis: Secondary | ICD-10-CM | POA: Diagnosis present

## 2022-09-16 DIAGNOSIS — D62 Acute posthemorrhagic anemia: Secondary | ICD-10-CM | POA: Diagnosis present

## 2022-09-16 DIAGNOSIS — Z90710 Acquired absence of both cervix and uterus: Secondary | ICD-10-CM

## 2022-09-16 DIAGNOSIS — K7290 Hepatic failure, unspecified without coma: Secondary | ICD-10-CM

## 2022-09-16 DIAGNOSIS — E8809 Other disorders of plasma-protein metabolism, not elsewhere classified: Secondary | ICD-10-CM | POA: Diagnosis present

## 2022-09-16 DIAGNOSIS — Z803 Family history of malignant neoplasm of breast: Secondary | ICD-10-CM | POA: Diagnosis not present

## 2022-09-16 DIAGNOSIS — E119 Type 2 diabetes mellitus without complications: Secondary | ICD-10-CM

## 2022-09-16 LAB — CBC WITH DIFFERENTIAL/PLATELET
Abs Immature Granulocytes: 0.01 10*3/uL (ref 0.00–0.07)
Basophils Absolute: 0 10*3/uL (ref 0.0–0.1)
Basophils Relative: 1 %
Eosinophils Absolute: 0.2 10*3/uL (ref 0.0–0.5)
Eosinophils Relative: 3 %
HCT: 29.9 % — ABNORMAL LOW (ref 36.0–46.0)
Hemoglobin: 9.5 g/dL — ABNORMAL LOW (ref 12.0–15.0)
Immature Granulocytes: 0 %
Lymphocytes Relative: 33 %
Lymphs Abs: 1.5 10*3/uL (ref 0.7–4.0)
MCH: 30.4 pg (ref 26.0–34.0)
MCHC: 31.8 g/dL (ref 30.0–36.0)
MCV: 95.5 fL (ref 80.0–100.0)
Monocytes Absolute: 0.7 10*3/uL (ref 0.1–1.0)
Monocytes Relative: 14 %
Neutro Abs: 2.2 10*3/uL (ref 1.7–7.7)
Neutrophils Relative %: 49 %
Platelets: 173 10*3/uL (ref 150–400)
RBC: 3.13 MIL/uL — ABNORMAL LOW (ref 3.87–5.11)
RDW: 20.7 % — ABNORMAL HIGH (ref 11.5–15.5)
WBC: 4.6 10*3/uL (ref 4.0–10.5)
nRBC: 0 % (ref 0.0–0.2)

## 2022-09-16 LAB — GLUCOSE, CAPILLARY
Glucose-Capillary: 99 mg/dL (ref 70–99)
Glucose-Capillary: 133 mg/dL — ABNORMAL HIGH (ref 70–99)
Glucose-Capillary: 116 mg/dL — ABNORMAL HIGH (ref 70–99)

## 2022-09-16 LAB — HEPATITIS PANEL, ACUTE
HCV Ab: NONREACTIVE
Hep A IgM: NONREACTIVE
Hep B C IgM: NONREACTIVE
Hepatitis B Surface Ag: NONREACTIVE

## 2022-09-16 LAB — IRON AND TIBC
Iron: 29 ug/dL (ref 28–170)
Saturation Ratios: 13 % (ref 10.4–31.8)
TIBC: 217 ug/dL — ABNORMAL LOW (ref 250–450)
UIBC: 188 ug/dL

## 2022-09-16 LAB — FERRITIN: Ferritin: 45 ng/mL (ref 11–307)

## 2022-09-16 LAB — COMPREHENSIVE METABOLIC PANEL
ALT: 31 U/L (ref 0–44)
AST: 38 U/L (ref 15–41)
Albumin: 2.1 g/dL — ABNORMAL LOW (ref 3.5–5.0)
Alkaline Phosphatase: 106 U/L (ref 38–126)
Anion gap: 7 (ref 5–15)
BUN: 9 mg/dL (ref 8–23)
CO2: 27 mmol/L (ref 22–32)
Calcium: 8.3 mg/dL — ABNORMAL LOW (ref 8.9–10.3)
Chloride: 105 mmol/L (ref 98–111)
Creatinine, Ser: 0.58 mg/dL (ref 0.44–1.00)
GFR, Estimated: >60 mL/min (ref >60)
Glucose, Bld: 96 mg/dL (ref 70–99)
Potassium: 2.9 mmol/L — ABNORMAL LOW (ref 3.5–5.1)
Sodium: 139 mmol/L (ref 135–145)
Total Bilirubin: 0.7 mg/dL (ref 0.3–1.2)
Total Protein: 6.6 g/dL (ref 6.5–8.1)

## 2022-09-16 LAB — PROTIME-INR
INR: 1.4 — ABNORMAL HIGH (ref 0.8–1.2)
Prothrombin Time: 17.4 seconds — ABNORMAL HIGH (ref 11.4–15.2)

## 2022-09-16 LAB — HIV ANTIBODY (ROUTINE TESTING W REFLEX): HIV Screen 4th Generation wRfx: NONREACTIVE

## 2022-09-16 MED ORDER — INSULIN ASPART 100 UNIT/ML IJ SOLN
0.0000 [IU] | Freq: Three times a day (TID) | INTRAMUSCULAR | Status: DC
  Administered 2022-09-16 – 2022-09-18 (×4): 1 [IU] via SUBCUTANEOUS

## 2022-09-16 MED ORDER — FUROSEMIDE 40 MG PO TABS
40.0000 mg | ORAL_TABLET | Freq: Every day | ORAL | Status: DC
  Administered 2022-09-16 – 2022-09-18 (×3): 40 mg via ORAL
  Filled 2022-09-16 (×3): qty 1

## 2022-09-16 MED ORDER — METHOCARBAMOL 500 MG PO TABS
500.0000 mg | ORAL_TABLET | Freq: Two times a day (BID) | ORAL | Status: DC
  Administered 2022-09-16 – 2022-09-18 (×4): 500 mg via ORAL
  Filled 2022-09-16 (×4): qty 1

## 2022-09-16 MED ORDER — POTASSIUM CHLORIDE 20 MEQ PO PACK
40.0000 meq | PACK | Freq: Once | ORAL | Status: AC
  Administered 2022-09-16: 40 meq via ORAL
  Filled 2022-09-16: qty 2

## 2022-09-16 MED ORDER — SPIRONOLACTONE 25 MG PO TABS
100.0000 mg | ORAL_TABLET | Freq: Every day | ORAL | Status: DC
  Administered 2022-09-16 – 2022-09-18 (×3): 100 mg via ORAL
  Filled 2022-09-16 (×3): qty 4

## 2022-09-16 MED ORDER — OXYCODONE HCL 5 MG PO TABS
5.0000 mg | ORAL_TABLET | Freq: Four times a day (QID) | ORAL | Status: DC | PRN
  Administered 2022-09-16: 5 mg via ORAL
  Filled 2022-09-16: qty 1

## 2022-09-16 MED ORDER — POTASSIUM CHLORIDE 10 MEQ/100ML IV SOLN
10.0000 meq | INTRAVENOUS | Status: AC
  Administered 2022-09-16 (×4): 10 meq via INTRAVENOUS
  Filled 2022-09-16 (×4): qty 100

## 2022-09-16 NOTE — Assessment & Plan Note (Addendum)
Recent admission for this in the setting of 5 days of epistaxis, requiring transfusion. Hgb at discahrge 7.8, 9.6 three days ago. Suspect liver dysfunction may have contributed to her bleeding.  - Repeat CBC, INR as above  - Hold off on pharmacologic DVT ppx given recent bleed

## 2022-09-16 NOTE — Progress Notes (Signed)
FMTS Interim Progress Note  S: Patient assessed at bedside, resting comfortably watching TV. Patient was question about her underlying diagnosis and referenced a piece of paper of question from her daughter. States this all started after she received blood in the hospital. Patient unsure if she had liver damage and if it would fix itself. Discussed current medical work up. States she only has SOB with activity.  O: BP 132/72 (BP Location: Left Arm)   Pulse 87   Temp 98.7 F (37.1 C) (Oral)   Resp 16   Ht '5\' 4"'$  (1.626 m)   Wt 95.3 kg   SpO2 98%   BMI 36.06 kg/m    General: Laying in bed comfortably. Alert. NAD CV: RRR. 4/6 systolic murmur Pulm: CTAB. Normal WOB on RA. Abdomen: Distended, protuberant abdomen. Non-tender. Lower abdomen soft (patient says the fluid shifts based on her position) Ext: 2+ pitting edema bilaterally  A/P: Hypervolemia 2/2 cirrhosis Patient continues to be grossly hypervolemic upon exam with abdominal distension and peripheral edema. Appears to be secondary to cirrhosis given prior imaging but unclear etiology of liver damage. Hepatitis panel negative. -Paracentesis with fluid labs -F/u liver serologies  Systolic murmur Stable from prior exams. Unclear if related to hypervolemia vs underlying cardiac dysfunction. -Echo pending  Remainder of plan per day team.   Colletta Maryland, MD 09/16/2022, 11:39 PM PGY-1, [facility] Service pager [phone]

## 2022-09-16 NOTE — Assessment & Plan Note (Addendum)
Echocardiogram showed LVEF of 65-70%, with grade 1 diastolic dysfunction.  Aortic stenosis present-which probably explains her systolic murmur radiating to carotids.  Do not believe heart failure is primary cause of ascites.

## 2022-09-16 NOTE — Progress Notes (Signed)
SUBJECTIVE:   CHIEF COMPLAINT / HPI:   [name] is a pleasant 67 year old woman who is here today for follow-up from hospitalization and 2 ED visits.  Summary: Admission at [facility] 2/8 - 2/9 Admitted for acute blood loss anemia related to epistaxis.  Required 2 units PRBC and 1 dose IV Feraheme.  Hemoglobin at discharge 7.8.  Started on oral ferrous gluconate 324 mg daily.  [name] ED 2/15 Evaluated for recurrent diarrhea.  Patient notes the diarrhea started prior to her 2/8 admission.  While admitted, was noted to be hypokalemic.  In the ED 2/15, received IV fluids and discharged home.  [name], ED 2/24 Evaluated for worsening bilateral leg swelling, abdominal swelling, and shortness of breath.  DVT study and CT PE study both negative.  Labs unremarkable, including BNP, troponin.  EKG unremarkable with no ischemia.  Low suspicion for ACS given heart score of 3.  Given her erythematous legs, considered some sort of cellulitis picture.  Offered inpatient IV antibiotics versus outpatient p.o. treatment, patient elected for p.o.  Sent home with Keflex and doxycycline.  Today In clinic today, patient reports ongoing shortness of breath with exertion, 2-3 pillow orthopnea, lower abdominal swelling, "puffy" legs and knees along with continuing diarrhea (3 episodes a day).  Her dyspnea on exertion and orthopnea are worse in the last month than her baseline.  Also last month, experiencing the swelling of her abdominal wall and legs.  She reports her pannus used to be soft and flat (s/p significant weight loss) but now feels "swollen and hard".  Ongoing diarrhea, 3 episodes per day.  No longer melanotic as it was when she was admitted to [name] 2/8.  Tolerating p.o. iron well.  Recent C. difficile screen negative.  Last weight at [name] 2/8 was 192 pounds Today's weight on same scale 217.4 pounds  Family's history: - older sister with liver issues, non-alcoholic - sister w/ open heart  surgery and clogged cerebral arteries - other sister with heart problems, care at [name] - brother with heart problems  PERTINENT  PMH / PSH:  Patient Active Problem List   Diagnosis Date Noted   Decompensated liver disease ([place]) 09/16/2022    Priority: 1.   Epistaxis 08/28/2022    Priority: 1.   Hypokalemia 08/28/2022    Priority: 3.   Murmur 05/15/2022    Priority: 3.   DM2 (diabetes mellitus, type 2) ([place]) 04/26/2015    Priority: 14.   Hypervolemia 09/16/2022   COPD (chronic obstructive pulmonary disease) ([place]) 08/29/2022   Allergic rhinitis 08/29/2022   Chronic venous insufficiency 05/15/2022   Infection of lip 06/26/2021   Polypharmacy 02/25/2021   Lump on neck 05/19/2020   Dementia ([place]) 03/12/2020   COPD with chronic bronchitis 01/30/2020   Adenomatous polyp 10/14/2019   Delayed wound healing 09/07/2019   History of lumbar laminectomy for spinal cord decompression 08/26/2019   Mixed Alzheimer's and vascular dementia ([place] [facility]) 08/28/2017   Memory loss 08/10/2017   Urge incontinence 07/25/2016   Impaired functional mobility, balance, and endurance 07/24/2016   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/15/2015   Benign paroxysmal positional vertigo 01/24/2015   Type 2 diabetes mellitus with diabetic neuropathy, unspecified ([place]) 04/26/2014   Vitamin D deficiency 07/11/2013   Mood disorder ([place]) 07/11/2013   Osteopenia 11/04/2012   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   Chronic pain syndrome 04/26/2010   Thyromegaly 09/17/2006   Morbid obesity ([place]) 09/17/2006   GERD (  gastroesophageal reflux disease) 09/17/2006   OBJECTIVE:   BP 122/68   Pulse 88   Ht '5\' 4"'$  (1.626 m)   Wt 217 lb 6.4 oz (98.6 kg)   SpO2 97%   BMI 37.32 kg/m    General: Awake, alert, NAD, pleasant HEENT: Sclera anicteric, moist oral mucosa Cardiac: Regular rate and rhythm, 3/6 holosystolic murmur heard in all auscultation locations, 1 to 2 inch  JVD bilaterally, brisk cap refill, normal skin turgor Respiratory: CTAB, no rales, rhonchi, or wheezes Abdomen: Bowel sounds present, soft, nontender abdomen, bilateral upper quadrants soft, lower quadrants and pannus more firm with trace pitting edema Extremities: 3+ pitting edema to knees, 1+ over lateral thighs up to level of hips  ASSESSMENT/PLAN:   Hypervolemia Patient is frankly fluid overloaded on physical exam with 1 to 2 inch JVD, pitting edema of BLE up to hip, abdominal wall edema, and loud 3/6 holosystolic murmur.  Chart review shows 25.4 pound weight increase from last [name] visit 2/8.  HPI with worsening dyspnea and orthopnea also concerning.  At first considered heart failure, however previous echo in November overall unremarkable and BNP obtained in ED 3 days ago within normal limits.  CT PE study obtained in ED 2/24 does demonstrate "cirrhotic liver with associated splenomegaly and moderate volume of upper abdominal ascites".  Patient does not currently carry diagnosis of liver cirrhosis, last imaging of liver done on CT abdomen pelvis 01/30/2020, at which time there was concern for cirrhosis due to diffusely decreased decreased hepatic density with nodular contours, however patient's LFTs have never been elevated, at least in our recorded labs.  Overall, I am concerned for acute decompensated liver cirrhosis despite lack of risk factors.  Extensively discussed with patient.  After shared decision-making, patient amenable to direct admission to [facility] for further workup.  Warm handoff provided to admitting physician.     Ellen Essex, MD [place]

## 2022-09-16 NOTE — Assessment & Plan Note (Signed)
Patient is frankly fluid overloaded on physical exam with 1 to 2 inch JVD, pitting edema of BLE up to hip, abdominal wall edema, and loud 3/6 holosystolic murmur.  Chart review shows 25.4 pound weight increase from last [facility] visit 2/8.  HPI with worsening dyspnea and orthopnea also concerning.  At first considered heart failure, however previous echo in November overall unremarkable and BNP obtained in ED 3 days ago within normal limits.  CT PE study obtained in ED 2/24 does demonstrate "cirrhotic liver with associated splenomegaly and moderate volume of upper abdominal ascites".  Patient does not currently carry diagnosis of liver cirrhosis, last imaging of liver done on CT abdomen pelvis 01/30/2020, at which time there was concern for cirrhosis due to diffusely decreased decreased hepatic density with nodular contours, however patient's LFTs have never been elevated, at least in our recorded labs.  Overall, I am concerned for acute decompensated liver cirrhosis despite lack of risk factors.  Extensively discussed with patient.  After shared decision-making, patient amenable to direct admission to hospital for further workup.  Warm handoff provided to admitting physician.

## 2022-09-16 NOTE — Assessment & Plan Note (Addendum)
Improved ascites after paracentesis, now on p.o. Lasix and 2 g sodium diet.  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  Recommend outpatient EGD for varices screening.  Ascites fluid suggestive of liver etiology-with out concern for SBP.  Patient will need GI follow-up outpatient.  Will restart statin at discharge. - GI consulted, appreciate recommendations - Spironolactone '100mg'$  daily and Lasix '40mg'$  daily - Daily weights - Strict I/O

## 2022-09-16 NOTE — H&P (Addendum)
Hospital Admission History and Physical Service Pager: [phone]  Patient name: [name] record number: [id number] Date of Birth: [birth year] Age: 67 y.o. Gender: female  Primary Care Provider: Ezequiel Essex, MD Consultants: None Code Status: Full  Preferred Emergency Contact: Daughter Pearletha Furl [phone]  Chief Complaint: Shortness of Breath and Edema  Assessment and Plan: [name] is a 67 y.o. female presenting with volume overload  and shortness of breath suspicious for new onset decompensated cirrhosis vs acute heart failure or valvular disease. Decompensated cirrhosis seems most likely given recent imaging showing cirrhosis of the liver and moderate volume ascites that has never been fully evaluated. Also at risk for acute/subacute valvular heart disease given significant (4/6) murmur that seems louder than baseline. Will need evaluation for both liver and heart disease.   * Decompensated liver disease (HCC) Frankly volume overloaded on exam, weight is up 7.9 kg from 3 days ago.  No overt risk factors for liver disease. Never used etOH or IV drugs. Of note, she tells me that her sister has liver disease that it was not related to alcohol, suspect there may be a familial/serologic component.  Interestingly, no history of elevated trans Eminase's on previous lab work.  Does have chronic appearing hypoalbuminemia.  Mention of cirrhotic appearance of liver dating to 2021 that has never been worked up. Mention of moderate volume ascites on CTA PE study done three days ago. No fever or abdominal pain to suggest SBP.   - Admit to med-surg - IR Paracentesis with labs - US Liver - CBC, CMP, INR - Liver serologies to include hepatitis panel, ANA, AMA, SMA, ceruloplasmin, iron/TIBC, ferritin, a-1 antitrypsin  - Spironolactone '100mg'$  daily and Lasix '40mg'$  daily - Daily weights - Strict I/O  Murmur 4/6 and radiating to the carotids. Last echo in 05/2022 with normal LV  function and no significant valvular disease beyond "very mild AS." However, given degree of murmur and acute/subacute change, will need repeat echo.  Certainly it is possible that profound right-sided valvular disease would contribute to her symptoms.  However, systolic murmur radiating to the carotids is more suspicious for aortic pathology. - Repeat echocardiogram   Anemia associated with acute blood loss Recent admission for this in the setting of 5 days of epistaxis, requiring transfusion. Hgb at discahrge 7.8, 9.6 three days ago. Suspect liver dysfunction may have contributed to her bleeding.  - Repeat CBC, INR as above  - Hold off on pharmacologic DVT ppx given recent bleed  DM2 (diabetes mellitus, type 2) (HCC) Last A1c 5.0 in 04/2022. On Jardiance and Metformin at home - CBGs/SSI - Repeat A1c - Holding statin in setting of acute liver decompensation       FEN/GI: Carb-modified VTE Prophylaxis: SCDs given recent bleeding event   Disposition: Med-surg  History of Present Illness:  [name] is a 67 y.o. female presenting with volume overload and shortness of breath.  Patient presented to her PCP office this morning complaining of the same and given high concern for acute decompensated liver disease, recommendation for direct admission.  She was admitted at the beginning of this month for acute blood loss anemia in the setting of a 5-day nosebleed.  She received blood transfusions and was discharged home with a hemoglobin of 7.8 and started on iron supplementation.  Patient was also found to have hypokalemia at that time.   She represented to the ED on 2/24 with leg swelling.  She had a CTA PE  study and DVT study at that time that was negative.  She was offered admission for further workup but declined.  She was also sent home with Keflex and doxycycline for ??lower extremity cellulitis.  On arrival to the PCP office today, she notes that she is having orthopnea, abdominal  swelling, hardness/edema to her abdominal wall.  She has ongoing shortness of breath with any exertion at all and feels that her belly is much larger than it has been in the past.   Of note, her weight in clinic was up 7.9 kg from her ED weight on 2/24.  Review Of Systems: Per HPI with the following additions:  Review of Systems  Constitutional:  Negative for fever.  HENT:  Positive for nosebleeds.   Respiratory:  Positive for shortness of breath. Negative for cough, sputum production and wheezing.   Cardiovascular:  Positive for chest pain, orthopnea and leg swelling.  Gastrointestinal:  Negative for abdominal pain.  All other systems reviewed and are negative.    Pertinent Past Medical History: Diabetes type 2, recent admission for epistaxis Remainder reviewed in history tab.   Pertinent Past Surgical History: Remainder reviewed in history tab.   Pertinent Social History: Tobacco use: Never Alcohol use: None Other Substance use: Never  Pertinent Family History: Sister with nonalcoholic liver disease Sister with significant ASCVD of both the heart and brain  Remainder reviewed in history tab.   Important Outpatient Medications: Jardiance and metformin Remainder reviewed in medication history.   Objective: BP 131/75 (BP Location: Left Arm)   Pulse 87   Temp 98.7 F (37.1 C) (Oral)   Resp 18   Ht '5\' 4"'$  (1.626 m)   Wt 95.3 kg   SpO2 96%   BMI 36.06 kg/m  Exam: General: Ill appearing but comfortable Eyes: Sclerae anicteric ENTM: MMM, +JVD with hepatojugular reflex Neck: Large thyroid Cardiovascular: 4/6 systolic murmur radiating to the carotids, Regular rate and rhythm  Respiratory: Normal WOB on RA, lungs clear to my exam Gastrointestinal: Significant abdominal distention with abdominal wall edema/induration consistent with anasarca, bowel sounds normoactive MSK: 3+ pitting edema to the hips bilaterally Derm: Without rash or excoriation Neuro: Strength and  sensation intact, gait normal but ambulation limited by dyspnea Psych: Mood and affect appropriate to situation  Labs:  CBC BMET  Recent Labs  Lab 09/13/22 0349  WBC 5.9  HGB 9.6*  HCT 30.4*  PLT 146*   Recent Labs  Lab 09/13/22 0349  NA 139  K 3.3*  CL 106  CO2 24  BUN 7*  CREATININE 0.58  GLUCOSE 105*  CALCIUM 8.1*     EKG: EKG has been ordered   Imaging Studies Performed:  No imaging at present. Orders in as above.    Eppie Gibson, MD 09/16/2022, 12:32 PM PGY-2, [place] [facility] pager: [phone], text pages welcome Secure chat group [place]

## 2022-09-16 NOTE — Assessment & Plan Note (Addendum)
Stable. - Continue Jardiance - Restart metformin at discharge

## 2022-09-16 NOTE — Patient Instructions (Signed)
It was wonderful to see you today. Thank you for allowing me to be a part of your care. Below is a short summary of what we discussed at your visit today:  Swelling, shortness of breath I am concerned about both your heart and your liver.   I think what you have going on is called decompensated cirrhosis.  This is where your liver does not work properly and fluid backs up into the rest of the body.  You need to be seen in the hospital to run the proper tests, blood work, and imaging.  We also need to get the correct specialists on board.  Please have your phone on and be waiting for a phone call from the hospital.  They will call you when a bed is available on the correct unit for you.  They will instruct you to come in directly to the hospital for admission.  Like we talked about, if you get short of breath or have other concerns while you are waiting to be admitted to the hospital please go to the emergency room.   If you have any questions or concerns, please do not hesitate to contact us via phone or MyChart message.   Ezequiel Essex, MD

## 2022-09-17 ENCOUNTER — Inpatient Hospital Stay (HOSPITAL_COMMUNITY): Payer: 59

## 2022-09-17 DIAGNOSIS — R011 Cardiac murmur, unspecified: Secondary | ICD-10-CM

## 2022-09-17 HISTORY — PX: IR PARACENTESIS: IMG2679

## 2022-09-17 LAB — GLUCOSE, CAPILLARY
Glucose-Capillary: 121 mg/dL — ABNORMAL HIGH (ref 70–99)
Glucose-Capillary: 101 mg/dL — ABNORMAL HIGH (ref 70–99)
Glucose-Capillary: 148 mg/dL — ABNORMAL HIGH (ref 70–99)
Glucose-Capillary: 127 mg/dL — ABNORMAL HIGH (ref 70–99)

## 2022-09-17 LAB — MITOCHONDRIAL ANTIBODIES: Mitochondrial M2 Ab, IgG: <20 Units (ref 0.0–20.0)

## 2022-09-17 LAB — ALPHA-1-ANTITRYPSIN: A-1 Antitrypsin, Ser: 178 mg/dL (ref 101–187)

## 2022-09-17 LAB — CBC
HCT: 30 % — ABNORMAL LOW (ref 36.0–46.0)
Hemoglobin: 9.8 g/dL — ABNORMAL LOW (ref 12.0–15.0)
MCH: 31.2 pg (ref 26.0–34.0)
MCHC: 32.7 g/dL (ref 30.0–36.0)
MCV: 95.5 fL (ref 80.0–100.0)
Platelets: 152 10*3/uL (ref 150–400)
RBC: 3.14 MIL/uL — ABNORMAL LOW (ref 3.87–5.11)
RDW: 20.5 % — ABNORMAL HIGH (ref 11.5–15.5)
WBC: 4.9 10*3/uL (ref 4.0–10.5)
nRBC: 0 % (ref 0.0–0.2)

## 2022-09-17 LAB — BASIC METABOLIC PANEL
Anion gap: 9 (ref 5–15)
BUN: 12 mg/dL (ref 8–23)
CO2: 25 mmol/L (ref 22–32)
Calcium: 7.9 mg/dL — ABNORMAL LOW (ref 8.9–10.3)
Chloride: 104 mmol/L (ref 98–111)
Creatinine, Ser: 0.67 mg/dL (ref 0.44–1.00)
GFR, Estimated: >60 mL/min (ref >60)
Glucose, Bld: 89 mg/dL (ref 70–99)
Potassium: 4.5 mmol/L (ref 3.5–5.1)
Sodium: 138 mmol/L (ref 135–145)

## 2022-09-17 LAB — LACTATE DEHYDROGENASE, PLEURAL OR PERITONEAL FLUID: LD, Fluid: 25 U/L — ABNORMAL HIGH (ref 3–23)

## 2022-09-17 LAB — BODY FLUID CELL COUNT WITH DIFFERENTIAL
Eos, Fluid: 0 %
Lymphs, Fluid: 55 %
Monocyte-Macrophage-Serous Fluid: 43 % — ABNORMAL LOW (ref 50–90)
Neutrophil Count, Fluid: 2 % (ref 0–25)
Total Nucleated Cell Count, Fluid: 275 cu mm (ref 0–1000)

## 2022-09-17 LAB — ECHOCARDIOGRAM COMPLETE
AR max vel: 1.4 cm2
AV Area VTI: 1.51 cm2
AV Area mean vel: 1.35 cm2
AV Mean grad: 17 mmHg
AV Peak grad: 29.1 mmHg
Ao pk vel: 2.7 m/s
Area-P 1/2: 2.55 cm2
Calc EF: 59.5 %
Height: 64 in
MV VTI: 3.46 cm2
S' Lateral: 2.3 cm
Single Plane A2C EF: 59.7 %
Single Plane A4C EF: 64.5 %
Weight: 3361.6 oz

## 2022-09-17 LAB — ALBUMIN, PLEURAL OR PERITONEAL FLUID: Albumin, Fluid: <1.5 g/dL

## 2022-09-17 LAB — ANA W/REFLEX IF POSITIVE: Anti Nuclear Antibody (ANA): NEGATIVE

## 2022-09-17 LAB — ALBUMIN: Albumin: 2.2 g/dL — ABNORMAL LOW (ref 3.5–5.0)

## 2022-09-17 LAB — HEMOGLOBIN A1C
Hgb A1c MFr Bld: 5.4 % (ref 4.8–5.6)
Mean Plasma Glucose: 108 mg/dL

## 2022-09-17 LAB — ANTI-SMOOTH MUSCLE ANTIBODY, IGG: F-Actin IgG: 12 Units (ref 0–19)

## 2022-09-17 LAB — PROTEIN, PLEURAL OR PERITONEAL FLUID: Total protein, fluid: <3 g/dL

## 2022-09-17 LAB — GLUCOSE, PLEURAL OR PERITONEAL FLUID: Glucose, Fluid: 134 mg/dL

## 2022-09-17 LAB — CERULOPLASMIN: Ceruloplasmin: 18 mg/dL — ABNORMAL LOW (ref 19.0–39.0)

## 2022-09-17 MED ORDER — FLUTICASONE PROPIONATE 50 MCG/ACT NA SUSP
2.0000 | Freq: Every day | NASAL | Status: DC
  Administered 2022-09-17 – 2022-09-18 (×2): 2 via NASAL
  Filled 2022-09-17: qty 16

## 2022-09-17 MED ORDER — ENOXAPARIN SODIUM 40 MG/0.4ML IJ SOSY
40.0000 mg | PREFILLED_SYRINGE | INTRAMUSCULAR | Status: DC
  Administered 2022-09-17: 40 mg via SUBCUTANEOUS
  Filled 2022-09-17: qty 0.4

## 2022-09-17 MED ORDER — GABAPENTIN 400 MG PO CAPS
800.0000 mg | ORAL_CAPSULE | Freq: Two times a day (BID) | ORAL | Status: DC
  Administered 2022-09-17 – 2022-09-18 (×3): 800 mg via ORAL
  Filled 2022-09-17 (×3): qty 2

## 2022-09-17 MED ORDER — ATORVASTATIN CALCIUM 10 MG PO TABS
10.0000 mg | ORAL_TABLET | Freq: Every day | ORAL | Status: DC
  Administered 2022-09-17: 10 mg via ORAL
  Filled 2022-09-17: qty 1

## 2022-09-17 MED ORDER — LORATADINE 10 MG PO TABS
10.0000 mg | ORAL_TABLET | Freq: Every day | ORAL | Status: DC
  Administered 2022-09-17 – 2022-09-18 (×2): 10 mg via ORAL
  Filled 2022-09-17 (×2): qty 1

## 2022-09-17 MED ORDER — DULOXETINE HCL 60 MG PO CPEP
60.0000 mg | ORAL_CAPSULE | Freq: Every day | ORAL | Status: DC
  Administered 2022-09-17 – 2022-09-18 (×2): 60 mg via ORAL
  Filled 2022-09-17 (×2): qty 1

## 2022-09-17 MED ORDER — PANTOPRAZOLE SODIUM 40 MG PO TBEC
40.0000 mg | DELAYED_RELEASE_TABLET | Freq: Every day | ORAL | Status: DC
  Administered 2022-09-17 – 2022-09-18 (×2): 40 mg via ORAL
  Filled 2022-09-17 (×2): qty 1

## 2022-09-17 MED ORDER — LIDOCAINE HCL 1 % IJ SOLN
INTRAMUSCULAR | Status: AC
  Administered 2022-09-17: 20 mL
  Filled 2022-09-17: qty 20

## 2022-09-17 MED ORDER — EMPAGLIFLOZIN 25 MG PO TABS
25.0000 mg | ORAL_TABLET | Freq: Every day | ORAL | Status: DC
  Administered 2022-09-17 – 2022-09-18 (×2): 25 mg via ORAL
  Filled 2022-09-17 (×2): qty 1

## 2022-09-17 MED ORDER — OXYCODONE HCL 5 MG PO TABS: 5.0000 mg | ORAL_TABLET | Freq: Two times a day (BID) | ORAL | Status: DC | PRN

## 2022-09-17 MED ORDER — FERROUS GLUCONATE 324 (38 FE) MG PO TABS
324.0000 mg | ORAL_TABLET | Freq: Every day | ORAL | Status: DC
  Administered 2022-09-17 – 2022-09-18 (×2): 324 mg via ORAL
  Filled 2022-09-17 (×2): qty 1

## 2022-09-17 NOTE — Progress Notes (Signed)
  Echocardiogram 2D Echocardiogram has been performed.  [name] 09/17/2022, 11:24 AM

## 2022-09-17 NOTE — Progress Notes (Signed)
Med rec performed after discussing with patient: Albuterol as needed Atorvastatin 10 mg daily Cymbalta 60 mg daily Jardiance 25 mg daily-not sure if she takes this Iron pill daily Flonase as needed Gabapentin '800mg'$  BID Claritin 10 mg daily Meclizine as needed Metformin 1000 mg BID Omeprazole 20 mg in evening Zofran 4 mg as needed Potassium 20 meq daily-not any more Robaxin 500 mg BID Oxycodone '5mg'$  BID

## 2022-09-17 NOTE — Hospital Course (Signed)
  PCP to follow up  Will need outpatient EGD for variceal screening

## 2022-09-17 NOTE — Plan of Care (Signed)

## 2022-09-17 NOTE — Assessment & Plan Note (Deleted)
tt

## 2022-09-17 NOTE — Consult Note (Signed)
Consultation Note   Referring Provider:  Teaching Service PCP: Ezequiel Essex, MD Primary Gastroenterologist: Ferol Luz        Reason for consultation: newly diagnosed cirrhosis   Hospital Day: 2  ASSESSMENT :  # 67 yo female with newly diagnosed cirrhosis on [place].  Presenting with ascites ,  presumably decompensation of cirrhosis ( though cardiac workup in progress). MELD 3.0: 13 at 09/17/2022 12:27 AM.  Cannot calculate SAAG.   Ceruloplasmin is slightly low but Wilson's disease seems unlikely.  ANA negative, hep B and C negative. Alpha antitrypsin WNL. Iron/TIBC and ferritin WNL.Marland Kitchen  Still some remaining labs to obtain which I will order today.  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 Marland Kitchen  PLAN: -Will obtain ASMA, AMA, TTG and IgA as part of hepatic serologic workup for etiology of cirrhosis.  - Will get a serum albumin level today to calculate SAAG.  - [place] screening . Will obtain AFP . No focal liver lesions on recent ultrasound - Await ascitic fluid cell count / culture / culture - She will need an EGD for esophageal varices screening at some point.  Most likely outpatient -No asterixis on exam but she is sleepy so low threshold for starting lactulose.  However she has been having some loose stools so we will hold off for now - Continue Aldactone 100 mg/Lasix 40 mg daily - 2 g sodium restricted diet  HPI:  Patient is a 67 y.o. year old female with a past medical history of  colon polyps, COPD, DM2, chronic anemia, aortic stenosis, cirrhosis.  See PMH for any additional medical problems.  [name] was recently hospitalized with worsening anemia in setting of epistaxis. She was transfused.   [name] was readmitted yesterday with volume overload / ascites /   SOB. She has been undergoing workup for ? CHF but also recently diagnosed with cirrhosis. She had a 2.3 L paracentesis done yesterday.She is undergoing diuresis. Renal function is  normal. She is a bit sleepy today, falling asleep during my interview.  Patient mentions that she has a sister with an enlarged liver.  No other known liver disease in the family.  Patient has never consumed alcohol  Echocardiogram showed LVEF of 65-70%, with grade 1 diastolic dysfunction and aortic stenosis    Recent Imaging and Labs: IR Paracentesis  Result Date: 09/17/2022 INDICATION: Patient with cirrhosis and new onset ascites. Interventional radiology asked to perform a diagnostic and therapeutic paracentesis. EXAM: ULTRASOUND GUIDED PARACENTESIS MEDICATIONS: % lidocaine 10 mL COMPLICATIONS: None immediate. PROCEDURE: Informed written consent was obtained from the patient after a discussion of the risks, benefits and alternatives to treatment. A timeout was performed prior to the initiation of the procedure. Initial ultrasound scanning demonstrates a large amount of ascites within the right lower abdominal quadrant. The right lower abdomen was prepped and draped in the usual sterile fashion. 1% lidocaine was used for local anesthesia. Following this, a 19 gauge, 7-cm, Yueh catheter was introduced. An ultrasound image was saved for documentation purposes. The paracentesis was performed. The catheter was removed and a dressing was applied. The patient tolerated the procedure well without immediate post procedural complication. FINDINGS: A total of approximately 2.3 L of clear yellow fluid was removed. Samples were sent to  the laboratory as requested by the clinical team. IMPRESSION: Successful ultrasound-guided paracentesis yielding 2.3 liters of peritoneal fluid. Read by: Soyla Dryer, NP PLAN: If the patient eventually requires >/=2 paracenteses in a 30 day period, candidacy for formal evaluation by the [facility] will be assessed. Electronically Signed   By: Corrie Mckusick D.O.   On: 09/17/2022 12:57   ECHOCARDIOGRAM COMPLETE  Result Date:  09/17/2022    ECHOCARDIOGRAM REPORT   Patient Name:   [name] Date of Exam: 09/17/2022 Medical Rec #:  [id number]      Height:       64.0 in Accession #:    [id number]     Weight:       210.1 lb Date of Birth:  06/19/1956       BSA:          1.998 m Patient Age:    70 years       BP:           129/73 mmHg Patient Gender: F              HR:           86 bpm. Exam Location:  Inpatient Procedure: 2D Echo, Color Doppler and Cardiac Doppler Indications:    Murmur  History:        Patient has prior history of Echocardiogram examinations, most                 recent 05/26/2022. Stroke and COPD, Signs/Symptoms:Alzheimer's;                 Risk Factors:Hypertension, Diabetes and Obesity.  Sonographer:    Eartha Inch Referring Phys: [id number] [place] Comments: Image acquisition challenging due to patient body habitus. IMPRESSIONS  1. Left ventricular ejection fraction, by estimation, is 65 to 70%. The left ventricle has normal function. The left ventricle has no regional wall motion abnormalities. Left ventricular diastolic parameters are consistent with Grade I diastolic dysfunction (impaired relaxation).  2. Right ventricular systolic function is normal. The right ventricular size is normal. There is mildly elevated pulmonary artery systolic pressure. The estimated right ventricular systolic pressure is [id number] mmHg.  3. Left atrial size was mildly dilated.  4. Right atrial size was mildly dilated.  5. The mitral valve is normal in structure. No evidence of mitral valve regurgitation. No evidence of mitral stenosis.  6. The aortic valve is tricuspid. There is moderate calcification of the aortic valve. Aortic valve regurgitation is not visualized. Mild aortic valve stenosis. Aortic valve area, by VTI measures 1.51 cm. Aortic valve mean gradient measures 17.0 mmHg. Aortic valve Vmax measures 2.70 m/s.  7. The inferior vena cava is normal in size with greater than 50% respiratory variability,  suggesting right atrial pressure of 3 mmHg. FINDINGS  Left Ventricle: Left ventricular ejection fraction, by estimation, is 65 to 70%. The left ventricle has normal function. The left ventricle has no regional wall motion abnormalities. The left ventricular internal cavity size was normal in size. There is  no left ventricular hypertrophy. Left ventricular diastolic parameters are consistent with Grade I diastolic dysfunction (impaired relaxation). Right Ventricle: The right ventricular size is normal. No increase in right ventricular wall thickness. Right ventricular systolic function is normal. There is mildly elevated pulmonary artery systolic pressure. The tricuspid regurgitant velocity is 2.71  m/s, and with an assumed right atrial pressure of 5 mmHg, the estimated right ventricular systolic pressure is  34.4 mmHg. Left Atrium: Left atrial size was mildly dilated. Right Atrium: Right atrial size was mildly dilated. Pericardium: There is no evidence of pericardial effusion. Mitral Valve: The mitral valve is normal in structure. No evidence of mitral valve regurgitation. No evidence of mitral valve stenosis. MV peak gradient, 2.9 mmHg. The mean mitral valve gradient is 2.0 mmHg. Tricuspid Valve: The tricuspid valve is normal in structure. Tricuspid valve regurgitation is mild . No evidence of tricuspid stenosis. Aortic Valve: The aortic valve is tricuspid. There is moderate calcification of the aortic valve. Aortic valve regurgitation is not visualized. Mild aortic stenosis is present. Aortic valve mean gradient measures 17.0 mmHg. Aortic valve peak gradient measures 29.1 mmHg. Aortic valve area, by VTI measures 1.51 cm. Pulmonic Valve: The pulmonic valve was normal in structure. Pulmonic valve regurgitation is trivial. No evidence of pulmonic stenosis. Aorta: The aortic root is normal in size and structure. Venous: The inferior vena cava is normal in size with greater than 50% respiratory variability, suggesting  right atrial pressure of 3 mmHg. IAS/Shunts: No atrial level shunt detected by color flow Doppler.  LEFT VENTRICLE PLAX 2D LVIDd:         3.70 cm      Diastology LVIDs:         2.30 cm      LV e' medial:    7.40 cm/s LV PW:         1.10 cm      LV E/e' medial:  8.7 LV IVS:        1.20 cm      LV e' lateral:   7.72 cm/s LVOT diam:     2.00 cm      LV E/e' lateral: 8.4 LV SV:         81 LV SV Index:   41 LVOT Area:     3.14 cm  LV Volumes (MOD) LV vol d, MOD A2C: 142.0 ml LV vol d, MOD A4C: 111.0 ml LV vol s, MOD A2C: 57.2 ml LV vol s, MOD A4C: 39.4 ml LV SV MOD A2C:     84.8 ml LV SV MOD A4C:     111.0 ml LV SV MOD BP:      74.6 ml RIGHT VENTRICLE RV S prime:     14.90 cm/s TAPSE (M-mode): 1.7 cm LEFT ATRIUM             Index        RIGHT ATRIUM           Index LA diam:        4.30 cm 2.15 cm/m   RA Area:     15.70 cm LA Vol (A2C):   57.0 ml 28.53 ml/m  RA Volume:   37.30 ml  18.67 ml/m LA Vol (A4C):   63.1 ml 31.59 ml/m LA Biplane Vol: 61.1 ml 30.58 ml/m  AORTIC VALVE AV Area (Vmax):    1.40 cm AV Area (Vmean):   1.35 cm AV Area (VTI):     1.51 cm AV Vmax:           269.50 cm/s AV Vmean:          199.500 cm/s AV VTI:            0.538 m AV Peak Grad:      29.1 mmHg AV Mean Grad:      17.0 mmHg LVOT Vmax:         120.00 cm/s LVOT Vmean:  85.800 cm/s LVOT VTI:          0.258 m LVOT/AV VTI ratio: 0.48  AORTA Ao Root diam: 2.70 cm Ao Asc diam:  3.60 cm MITRAL VALVE               TRICUSPID VALVE MV Area (PHT): 2.55 cm    TR Peak grad:   29.4 mmHg MV Area VTI:   3.46 cm    TR Mean grad:   20.0 mmHg MV Peak grad:  2.9 mmHg    TR Vmax:        271.00 cm/s MV Mean grad:  2.0 mmHg    TR Vmean:       212.0 cm/s MV Vmax:       0.85 m/s MV Vmean:      67.4 cm/s   SHUNTS MV Decel Time: 298 msec    Systemic VTI:  0.26 m MV E velocity: 64.50 cm/s  Systemic Diam: 2.00 cm MV A velocity: 63.90 cm/s MV E/A ratio:  1.01 Glori Bickers MD Electronically signed by Glori Bickers MD Signature Date/Time:  09/17/2022/11:36:31 AM    Final    US Abdomen Limited RUQ (LIVER/GB)  Result Date: 09/16/2022 CLINICAL DATA:  [id number] Ascites [id number] Cirrhosis ([place]) [id number] EXAM: ULTRASOUND ABDOMEN LIMITED COMPARISON:  08/18/2005. FINDINGS: Hepatic echogenicity increased consistent with fatty infiltration. No focal hepatic lesions identified. No biliary ductal dilatation. Gallbladder has been removed. There is a large ascites observed in the right upper and lower quadrants and left lower quadrant. CBD 0.7 cm. IMPRESSION: 1. Hepatic fatty infiltration 2. Cirrhotic appearance of the liver. 3. Large ascites. Electronically Signed   By: Sammie Bench M.D.   On: 09/16/2022 17:15   VAS [place] LOWER EXTREMITY VENOUS (DVT) (ONLY [facility] & [facility])  Result Date: 09/14/2022  Lower Venous DVT Study Patient Name:  [name]  Date of Exam:   09/13/2022 Medical Rec #: [id number]       Accession #:    [id number] Date of Birth: 01/02/1956        Patient Gender: F Patient Age:   51 years Exam Location:  [facility] Procedure:      VAS [place] LOWER EXTREMITY VENOUS (DVT) Referring Phys: Deno Etienne --------------------------------------------------------------------------------  Indications: Pain.  Limitations: Patein pain with compression. Comparison Study: Prior negative left LEV done 09/20/20 Performing Technologist: Sharion Dove RVS  Examination Guidelines: A complete evaluation includes B-mode imaging, spectral Doppler, color Doppler, and power Doppler as needed of all accessible portions of each vessel. Bilateral testing is considered an integral part of a complete examination. Limited examinations for reoccurring indications may be performed as noted. The reflux portion of the exam is performed with the patient in reverse Trendelenburg.  +-----+---------------+---------+-----------+----------+--------------+ RIGHTCompressibilityPhasicitySpontaneityPropertiesThrombus Aging  +-----+---------------+---------+-----------+----------+--------------+ CFV  Full           Yes      Yes                                 +-----+---------------+---------+-----------+----------+--------------+   +---------+---------------+---------+-----------+----------+-------------------+ LEFT     CompressibilityPhasicitySpontaneityPropertiesThrombus Aging      +---------+---------------+---------+-----------+----------+-------------------+ CFV      Full           Yes      Yes                                      +---------+---------------+---------+-----------+----------+-------------------+  SFJ      Full                                                             +---------+---------------+---------+-----------+----------+-------------------+ FV Prox  Full                                                             +---------+---------------+---------+-----------+----------+-------------------+ FV Mid                  Yes      Yes                                      +---------+---------------+---------+-----------+----------+-------------------+ FV Distal               Yes      Yes                                      +---------+---------------+---------+-----------+----------+-------------------+ PFV                                                   Not well visualized +---------+---------------+---------+-----------+----------+-------------------+ POP                     Yes      Yes                                      +---------+---------------+---------+-----------+----------+-------------------+ PTV      Full                                                             +---------+---------------+---------+-----------+----------+-------------------+ PERO     Full                                                             +---------+---------------+---------+-----------+----------+-------------------+    Summary: RIGHT: - No  evidence of common femoral vein obstruction.  LEFT: - No evidence of deep vein thrombosis in the lower extremity. No indirect evidence of obstruction proximal to the inguinal ligament. - No cystic structure found in the popliteal fossa.  *See table(s) above for measurements and observations. Electronically signed by Orlie Pollen on 09/14/2022 at 9:29:16 AM.    Final    CT Angio Chest PE W and/or Wo Contrast  Result Date: 09/13/2022 CLINICAL DATA:  Bilateral lower extremity and abdominal swelling. Shortness of breath on exertion. EXAM: CT ANGIOGRAPHY CHEST WITH CONTRAST TECHNIQUE: Multidetector CT imaging of the chest was performed using the standard protocol during bolus administration of intravenous contrast. Multiplanar CT image reconstructions and MIPs were obtained to evaluate the vascular anatomy. RADIATION DOSE REDUCTION: This exam was performed according to the departmental dose-optimization program which includes automated exposure control, adjustment of the mA and/or kV according to patient size and/or use of iterative reconstruction technique. CONTRAST:  75 mL OMNIPAQUE IOHEXOL 350 MG/ML SOLN COMPARISON:  Neck CT scan 07/24/2020.  CT chest 12/13/2017 FINDINGS: Cardiovascular: There is cardiomegaly. No pericardial effusion. No pulmonary embolus is identified. Calcific aortic atherosclerosis is noted. No aneurysm. Mediastinum/Nodes: Massively enlarged thyroid gland is identified as seen on the prior neck CT. No lymphadenopathy. Esophagus is unremarkable. Lungs/Pleura: Trace left pleural effusion is noted. No right effusion. Lungs demonstrate minimal dependent atelectasis. A 0.4 cm nodular opacity is seen in the right lower lobe on image 49, series 7. Upper Abdomen: The liver is shrunken with a nodular border consistent with cirrhosis. Moderate volume of upper abdominal ascites is present. The spleen is enlarged. Musculoskeletal: The patient has a severe compression fracture T7 which is new since the  prior chest CT but age indeterminate. Review of the MIP images confirms the above findings. IMPRESSION: Negative for pulmonary embolus or acute cardiopulmonary disease. Chronic thyromegaly. Trace left pleural effusion. Mild cardiomegaly. 0.4 cm right lower lobe pulmonary nodule. No follow-up needed if patient is low-risk.This recommendation follows the consensus statement: Guidelines for Management of Incidental Pulmonary Nodules Detected on CT Images: From the Fleischner Society 2017; Radiology 2017; 284:228-243. Cirrhotic liver with associated splenomegaly and moderate volume of upper abdominal ascites. T7 compression fracture is age indeterminate but appears remote. Aortic Atherosclerosis (ICD10-I70.0). Electronically Signed   By: Inge Rise M.D.   On: 09/13/2022 08:51   DG Chest 2 View  Result Date: 09/13/2022 CLINICAL DATA:  Shortness of breath and lower extremity swelling. EXAM: CHEST - 2 VIEW COMPARISON:  Thoracic spine series 01/01/2022, chest PA Lat 05/24/2020. FINDINGS: There is interval low inspiration with increased linear atelectasis in the bases. No focal pneumonia is seen or pleural effusion. There is calcification in the transverse aorta with normal cardiomediastinal silhouette. Generalized osteopenia. There is a vertebra plana compression fracture of T7, markedly worsened since thoracic spine films 01/01/2022 when the loss of vertebral height was about 30%. There is mild but increased thoracic kyphodextroscoliosis associated. IMPRESSION: 1. Low inspiration with increased linear atelectasis in the bases. 2. No evidence of CHF or pneumonia. 3. Osteopenia with vertebra plana compression fracture of T7, markedly worsened since 01/01/2022. Electronically Signed   By: Telford Nab M.D.   On: 09/13/2022 04:17      Labs:  Recent Labs    09/16/22 1139 09/17/22 0027  WBC 4.6 4.9  HGB 9.5* 9.8*  HCT 29.9* 30.0*  PLT 173 152   Recent Labs    09/16/22 1139 09/17/22 0027  NA 139 138   K 2.9* 4.5  CL 105 104  CO2 27 25  GLUCOSE 96 89  BUN 9 12  CREATININE 0.58 0.67  CALCIUM 8.3* 7.9*   Recent Labs    09/16/22 1139  PROT 6.6  ALBUMIN 2.1*  AST 38  ALT 31  ALKPHOS 106  BILITOT 0.7   Recent Labs    09/16/22 1139  HEPBSAG NON REACTIVE  HCVAB NON REACTIVE  HEPAIGM NON REACTIVE  HEPBIGM NON REACTIVE   Recent Labs  09/16/22 1139  LABPROT 17.4*  INR 1.4*    Past Medical History:  Diagnosis Date   Acute blood loss anemia 08/29/2022   Allergy    Arthritis    COPD (chronic obstructive pulmonary disease) (HCC)    Diabetes mellitus, type 2 (HCC)    Excessive daytime sleepiness 11/12/2017   GERD (gastroesophageal reflux disease)    Goiter    History of kidney stones    Hypertension    Kidney stone    Memory loss    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   Osteoporosis    in lower back per pt    Partial small bowel obstruction ([place]) 01/30/2020   Plantar fasciitis    Snoring 08/28/2017   Stroke ([place]) 15 years ago    residual left sided weakness   Subacute maxillary sinusitis 05/19/2020   Tremors of nervous system    Uterovaginal prolapse, incomplete 06/10/2010   Qualifier: Diagnosis of  By: Zebedee Iba NP, Manuela Schwartz     Vertigo     Past Surgical History:  Procedure Laterality Date   APPENDECTOMY     BLADDER SURGERY     x2   CATARACT EXTRACTION W/PHACO Left 05/06/2017   Procedure: CATARACT EXTRACTION PHACO AND INTRAOCULAR LENS PLACEMENT ([place]) LEFT DIABETIC;  Surgeon: Leandrew Koyanagi, MD;  Location: [place];  Service: Ophthalmology;  Laterality: Left;  Diabetic - oral meds   CATARACT EXTRACTION W/PHACO Right 06/03/2017   Procedure: CATARACT EXTRACTION PHACO AND INTRAOCULAR LENS PLACEMENT ([place]) RIGHT DIABETIC;  Surgeon: Leandrew Koyanagi, MD;  Location: [place];  Service: Ophthalmology;  Laterality: Right;  Diabetic - oral meds   CHOLECYSTECTOMY     COLONOSCOPY  03/14/2016   FOOT SURGERY     IR  PARACENTESIS  09/17/2022   KYPHOPLASTY N/A 04/07/2019   Procedure: L4 KYPHOPLASTY;  Surgeon: Hessie Knows, MD;  Location: [facility];  Service: Orthopedics;  Laterality: N/A;   TOTAL ABDOMINAL HYSTERECTOMY     TOTAL KNEE ARTHROPLASTY Left 09/21/2018   Procedure: TOTAL KNEE ARTHROPLASTY-LEFT NEEDS RNFA;  Surgeon: Hessie Knows, MD;  Location: [facility];  Service: Orthopedics;  Laterality: Left;    Family History  Problem Relation Age of Onset   Healthy Mother    Heart disease Father    Alcohol abuse Father    Alcohol abuse Brother    Heart disease Sister    Diabetes Sister    Breast cancer Daughter    Colon polyps Neg Hx    Colon cancer Neg Hx    Esophageal cancer Neg Hx    Rectal cancer Neg Hx    Stomach cancer Neg Hx     Prior to Admission medications   Medication Sig Start Date End Date Taking? Authorizing Provider  Accu-Chek FastClix Lancets MISC USE TO CHECK BLOOD SUGAR THREE TIMES DAILY 12/20/21   Ezequiel Essex, MD  albuterol (PROVENTIL) (2.5 MG/3ML) 0.083% nebulizer solution Take 3 mLs (2.5 mg total) by nebulization every 4 (four) hours as needed for wheezing. 02/17/19   Mullis, Kiersten P, DO  albuterol (VENTOLIN HFA) 108 (90 Base) MCG/ACT inhaler Inhale 1-2 puffs into the lungs every 6 (six) hours as needed for wheezing. 09/10/20   Mullis, Kiersten P, DO  Alcohol Swabs (B-D SINGLE USE SWABS REGULAR) PADS USE  TO  CLEAN  SKIN  BEFORE CHECKING BLOOD SUGAR 09/21/19   Mullis, Kiersten P, DO  atorvastatin (LIPITOR) 10 MG tablet TAKE 1 TABLET (10 MG TOTAL) BY MOUTH DAILY. PLEASE RETURN TO CLINIC FOR CHOLESTEROL CHECK. Patient  taking differently: Take 10 mg by mouth daily. 05/02/22   Ezequiel Essex, MD  Blood Glucose Monitoring Suppl (ACCU-CHEK GUIDE) w/Device KIT 1 Device by Does not apply route in the morning, at noon, and at bedtime. 09/28/19   Mullis, Kiersten P, DO  Calcium Carbonate-Vitamin D (CALCIUM-D PO) Take 1 tablet by mouth daily.    [provider]  Capsaicin 0.05 %  CREA Apply to feet twice daily. Patient taking differently: Apply 1 Application topically daily as needed (to feet). 11/30/20   Marzetta Board, DPM  cephALEXin (KEFLEX) 500 MG capsule Take 1 capsule (500 mg total) by mouth 4 (four) times daily. 09/13/22   Schutt, Grafton Folk, PA-C  doxycycline (VIBRAMYCIN) 100 MG capsule Take 1 capsule (100 mg total) by mouth 2 (two) times daily for 7 days. 09/13/22 09/20/22  Schutt, Grafton Folk, PA-C  DULoxetine (CYMBALTA) 60 MG capsule TAKE 1 CAPSULE BY MOUTH EVERY DAY Patient taking differently: Take 60 mg by mouth daily. 12/21/21   Ezequiel Essex, MD  empagliflozin (JARDIANCE) 25 MG TABS tablet Take 1 tablet (25 mg total) by mouth daily. 05/13/22   Ezequiel Essex, MD  ferrous gluconate (FERGON) 324 MG tablet Take 1 tablet (324 mg total) by mouth daily with breakfast. 08/29/22   Rai, Ripudeep K, MD  fluticasone (FLONASE) 50 MCG/ACT nasal spray SPRAY 2 SPRAYS INTO EACH NOSTRIL EVERY DAY Patient taking differently: Place 2 sprays into both nostrils daily. 11/12/20   Mullis, Kiersten P, DO  gabapentin (NEURONTIN) 800 MG tablet Take 800 mg by mouth 2 (two) times daily. 10/18/20 08/29/22  [provider]  glucose blood (ACCU-CHEK GUIDE) test strip USE  TO CHECK BLOOD SUGAR THREE TIMES DAILY 10/13/19   Mullis, Kiersten P, DO  glucose blood (ACCU-CHEK GUIDE) test strip 3 (three) times daily 12/02/19   [provider]  ketoconazole (NIZORAL) 2 % shampoo APPLY TO AFFECTED AREA TOPICALLY 2 (TWO) TIMES A WEEK. Patient taking differently: Apply 1 Application topically 2 (two) times a week. 07/25/20   Mullis, Kiersten P, DO  loratadine (CLARITIN) 10 MG tablet Take 10 mg by mouth daily.    [provider]  meclizine (ANTIVERT) 12.5 MG tablet TAKE 1 TABLET TWICE DAILY AS NEEDED FOR DIZZINESS. Patient taking differently: Take 12.5 mg by mouth every evening. 02/25/22   Ezequiel Essex, MD  metFORMIN (GLUCOPHAGE) 1000 MG tablet Take 1 tablet (1,000 mg total) by  mouth 2 (two) times daily with a meal. 08/11/22   Ezequiel Essex, MD  methocarbamol (ROBAXIN) 500 MG tablet Take 500 mg by mouth in the morning and at bedtime. 11/19/20   [provider]  Multiple Vitamins-Minerals ([employer] SPECTRAVITE WOMENS SENIOR PO) Take 1 tablet by mouth daily.     [provider]  omeprazole (PRILOSEC) 20 MG capsule TAKE 1 CAPSULE BY MOUTH EVERY DAY IN THE EVENING Patient taking differently: Take 20 mg by mouth every evening. 03/16/22   Ezequiel Essex, MD  ondansetron (ZOFRAN) 4 MG tablet TAKE 1 TABLET BY MOUTH EVERY 8 HOURS AS NEEDED FOR NAUSEA AND VOMITING Patient taking differently: Take 4 mg by mouth every 8 (eight) hours as needed for vomiting or nausea. 11/15/20   Mullis, Kiersten P, DO  oxyCODONE (OXY IR/ROXICODONE) 5 MG immediate release tablet Take 5 mg by mouth every 6 (six) hours as needed for moderate pain. 05/21/21   [provider]  potassium chloride SA (KLOR-CON M) 20 MEQ tablet Take 1 tablet (20 mEq total) by mouth daily. 09/05/22   [place] Fraise, MD  Wound Dressings (MEDIHONEY CA ALGINATE 2"X2") PADS  10/10/19   [provider]    Current Facility-Administered Medications  Medication Dose Route Frequency Provider Last Rate Last Admin   DULoxetine (CYMBALTA) DR capsule 60 mg  60 mg Oral Daily Gerrit Heck, MD   60 mg at 09/17/22 1018   empagliflozin (JARDIANCE) tablet 25 mg  25 mg Oral Daily Lowry Ram, MD   25 mg at 09/17/22 1442   enoxaparin (LOVENOX) injection 40 mg  40 mg Subcutaneous Q24H Lowry Ram, MD       ferrous gluconate (FERGON) tablet 324 mg  324 mg Oral Q breakfast Gerrit Heck, MD   324 mg at 09/17/22 1252   fluticasone (FLONASE) 50 MCG/ACT nasal spray 2 spray  2 spray Each Nare Daily Gerrit Heck, MD   2 spray at 09/17/22 1443   furosemide (LASIX) tablet 40 mg  40 mg Oral Daily Jim Like B, MD   40 mg at 09/17/22 [id number]   gabapentin (NEURONTIN) capsule 800 mg  800 mg Oral BID Gerrit Heck, MD   800 mg at 09/17/22 1017   insulin aspart (novoLOG) injection 0-9 Units  0-9 Units Subcutaneous TID WC Eppie Gibson, MD   1 Units at 09/17/22 0906   loratadine (CLARITIN) tablet 10 mg  10 mg Oral Daily Gerrit Heck, MD   10 mg at 09/17/22 1442   methocarbamol (ROBAXIN) tablet 500 mg  500 mg Oral BID Colletta Maryland, MD   500 mg at 09/17/22 [id number]   oxyCODONE (Oxy IR/ROXICODONE) immediate release tablet 5 mg  5 mg Oral BID PRN Gerrit Heck, MD       pantoprazole (PROTONIX) EC tablet 40 mg  40 mg Oral Daily Gerrit Heck, MD   40 mg at 09/17/22 1017   spironolactone (ALDACTONE) tablet 100 mg  100 mg Oral Daily Jim Like B, MD   100 mg at 09/17/22 [id number]    Allergies as of 09/16/2022 - Review Complete 09/16/2022  Allergen Reaction Noted   Pregabalin Nausea And Vomiting 02/25/2021   Hydrocodone-acetaminophen Nausea And Vomiting 04/29/2017    Social History   Socioeconomic History   Marital status: Single    Spouse name: Not on file   Number of children: 4   Years of education: HS   Highest education level: Not on file  Occupational History   Occupation: Unemployed  Tobacco Use   Smoking status: Never    Passive exposure: Never   Smokeless tobacco: Never  Vaping Use   Vaping Use: Never used  Substance and Sexual Activity   Alcohol use: No   Drug use: No   Sexual activity: Not on file  Other Topics Concern   Not on file  Social History Narrative   Lives at home alone.   Right-handed.   Rarely uses caffeine.   Social Determinants of Health   Financial Resource Strain: Low Risk  (09/21/2018)   Overall Financial Resource Strain (CARDIA)    Difficulty of Paying Living Expenses: Not hard at all  Food Insecurity: No Food Insecurity (09/16/2022)   Hunger Vital Sign    Worried About Running Out of Food in the Last Year: Never true    Ran Out of Food in the Last Year: Never true  Transportation Needs: No Transportation Needs (09/16/2022)   PRAPARE -  Hydrologist (Medical): No    Lack of Transportation (Non-Medical): No  Physical Activity: Insufficiently Active (09/21/2018)   Exercise Vital Sign  Days of Exercise per Week: 2 days    Minutes of Exercise per Session: 30 min  Stress: No Stress Concern Present (09/21/2018)   [place]    Feeling of Stress : Only a little  Social Connections: Unknown (09/21/2018)   Social Connection and Isolation Panel [NHANES]    Frequency of Communication with Friends and Family: Twice a week    Frequency of Social Gatherings with Friends and Family: Once a week    Attends Religious Services: Never    Marine scientist or Organizations: No    Attends Archivist Meetings: Never    Marital Status: Not on file  Intimate Partner Violence: Not At Risk (09/16/2022)   Humiliation, Afraid, Rape, and Kick questionnaire    Fear of Current or Ex-Partner: No    Emotionally Abused: No    Physically Abused: No    Sexually Abused: No    Review of Systems: All systems reviewed and negative except where noted in HPI.  Physical Exam: Vital signs in last 24 hours: Temp:  [97.9 F (36.6 C)-98.9 F (37.2 C)] 98.7 F (37.1 C) (02/28 0738) Pulse Rate:  [86-91] 91 (02/28 0738) Resp:  [16] 16 (02/28 0738) BP: (125-139)/(66-73) 139/66 (02/28 1214) SpO2:  [96 %-98 %] 96 % (02/28 0738)    General:  Sleepy female in NAD Psych:  Pleasant, cooperative. Normal mood and affect Eyes: Pupils equal Ears:  Normal auditory acuity Nose: No deformity, discharge or lesions Neck:  Supple, no masses felt Lungs:  Clear to auscultation.  Heart:  Regular rate, regular rhythm. Loud murmur. Abdomen:  Soft, obese,  nontender, active bowel sounds, no masses felt Rectal :  Deferred Msk: Symmetrical without gross deformities.  Neurologic:  Alert, oriented, grossly normal neurologically. No asterixis Extremities : 1+ BLE  edema Skin:  Intact without significant lesions.    Intake/Output from previous day: 02/27 0701 - 02/28 0700 In: 480 [P.O.:480] Out: -  Intake/Output this shift:  Total I/O In: 480 [P.O.:480] Out: 2450 [Urine:2450]    Principal Problem:   Decompensated liver disease ([place]) Active Problems:   DM2 (diabetes mellitus, type 2) ([place])   Murmur    Tye Savoy, NP-C @  09/17/2022, 2:48 PM

## 2022-09-17 NOTE — Progress Notes (Signed)
Daily Progress Note Intern Pager: [phone]  Patient name: [name] record number: [id number] Date of birth: 01-21-1956 Age: 67 y.o. Gender: female  Primary Care Provider: Ezequiel Essex, MD Consultants: GI Code Status: Code  Pt Overview and Major Events to Date:  2/27: Admitted 2/28: Paracentesis (2.3 L)  Assessment and Plan: [name] is a 67 year old female presenting with volume overload and shortness of breath, suspicious for new onset decompensated cirrhosis versus acute heart failure or valvular disease.  She is currently undergoing cirrhosis and cardiac workup.  Differential and management as below.  * Decompensated liver disease ([place]) RUQ yesterday showing hepatic fatty infiltration, cirrhotic appearance and large volume ascites.  IR paracentesis completed today, removed 2.3 L of clear yellow fluid-with no immediate complications.  Specimens sent for labs.  Of note ANA negative, hep B and C, alpha antitrypsin WNL.  Iron/TIBC and ferritin WNL-unlikely to be hemochromatosis. Ceruloplasmin 18, cannot rule out Wilson's disease.  GI consulted-suspect patient will need biopsy and/or genetic testing. MELD score of 10. Low concern for SBP as WBC WNL, afebrile, clear yellow fluid on paracentesis. - Consult GI, appreciate recommendations - Pending paracentesis labs - Spironolactone 1 mg daily and Lasix 40 mg daily - Daily weights - Strict I's and O's  Anemia associated with acute blood loss Recent admission for this in the setting of 5 days of epistaxis, requiring transfusion.  Hemoglobin stable at 9.8-was 7.8 at discharge on 08/29/2022.  INR slightly elevated at 1.4.  Suspect liver dysfunction contributed to her bleeding.  Initially held off on pharmacologic DVT prophylaxis, but will restart Lovenox tonight now that paracentesis is complete. - AM CBC  DM2 (diabetes, mellitus, type 2) (HCC) Repeat A1c of 5.4.  Restarted Jardiance today. On metformin at home,  continue to hold.  Consider discontinuing CBGs. - CBGs/SSI - Holding statin in setting of acute liver decompensation  Murmur Echocardiogram showed LVEF of 65-70%, with grade 1 diastolic dysfunction.  Aortic stenosis present-which probably explains her systolic murmur radiating to carotids.  Do not believe heart failure is primary cause of ascites.  Other chronic medical conditions Mood disorder: Restarted duloxetine Neuropathy: Restarted gabapentin 800 mg BID GERD: Restarted Protonix 40 mg daily Chronic pain syndrome: Restarted Robaxin 500 mg BID, and oxycodone 5 mg BID  FEN/GI: Carb modified PPx: Lovenox today after paracentesis Dispo: Pending medical workup  Subjective:  Patient has no acute concerns, did have questions related to cirrhosis-which interviewer answered and offered to speak again after paracentesis.  Patient's daughter left sheet with questions, will call daughter when available to discuss.  Objective: Temp:  [97.9 F (36.6 C)-98.9 F (37.2 C)] 98.7 F (37.1 C) (02/28 0738) Pulse Rate:  [86-91] 91 (02/28 0738) Resp:  [16] 16 (02/28 0738) BP: (125-139)/(66-73) 139/66 (02/28 1214) SpO2:  [96 %-98 %] 96 % (02/28 0738) Physical Exam: General: NAD, chronically appearing, resting comfortably in bed Cardiovascular: RRR, 3/6 systolic murmur Respiratory: CTAB, normal breathing on room air Abdomen: Mild tenderness diffusely, protuberant with fluid wave.  Bowel sounds present. Extremities: Moves all 4 extremities, bilateral trace pitting edema  Laboratory: Most recent CBC Lab Results  Component Value Date   WBC 4.9 09/17/2022   HGB 9.8 (L) 09/17/2022   HCT 30.0 (L) 09/17/2022   MCV 95.5 09/17/2022   PLT 152 09/17/2022   Most recent BMP    Latest Ref Rng & Units 09/17/2022   12:27 AM  BMP  Glucose 70 - 99 mg/dL 89   BUN 8 -  23 mg/dL 12   Creatinine 0.44 - 1.00 mg/dL 0.67   Sodium 135 - 145 mmol/L 138   Potassium 3.5 - 5.1 mmol/L 4.5   Chloride 98 - 111  mmol/L 104   CO2 22 - 32 mmol/L 25   Calcium 8.9 - 10.3 mg/dL 7.9     Ellen Dales, DO 09/17/2022, 1:26 PM  PGY-1, [place] Intern pager: [phone], text pages welcome Secure chat group [place]

## 2022-09-17 NOTE — Progress Notes (Signed)
  Transition of Care [facility]) Screening Note   Patient Details  Name: [name] Date of Birth: 1955/12/26   Transition [facility]) CM/SW Contact:    Cyndi Bender, RN Phone Number: 09/17/2022, 8:33 AM    Transition of Care Department [facility]) has reviewed patient and no TOC needs have been identified at this time. We will continue to monitor patient advancement through interdisciplinary progression rounds. If new patient transition needs arise, please place a TOC consult.

## 2022-09-17 NOTE — Procedures (Signed)
PROCEDURE SUMMARY:  Successful ultrasound guided paracentesis from the right abdomen  Yielded 2.3 L of clear yellow fluid.  No immediate complications.  The patient tolerated the procedure well.   Specimen sent for labs.  EBL < 2 mL  If the patient eventually requires >/=2 paracenteses in a 30 day period, screening evaluation by the [facility] will be assessed.  Soyla Dryer, [place] [phone] 09/17/2022, 12:43 PM

## 2022-09-18 ENCOUNTER — Other Ambulatory Visit (HOSPITAL_COMMUNITY): Payer: Self-pay

## 2022-09-18 ENCOUNTER — Encounter: Payer: Self-pay | Admitting: Gastroenterology

## 2022-09-18 LAB — COMPREHENSIVE METABOLIC PANEL
ALT: 29 U/L (ref 0–44)
AST: 36 U/L (ref 15–41)
Albumin: 2 g/dL — ABNORMAL LOW (ref 3.5–5.0)
Alkaline Phosphatase: 93 U/L (ref 38–126)
Anion gap: 10 (ref 5–15)
BUN: 10 mg/dL (ref 8–23)
CO2: 26 mmol/L (ref 22–32)
Calcium: 8.2 mg/dL — ABNORMAL LOW (ref 8.9–10.3)
Chloride: 103 mmol/L (ref 98–111)
Creatinine, Ser: 0.67 mg/dL (ref 0.44–1.00)
GFR, Estimated: >60 mL/min (ref >60)
Glucose, Bld: 106 mg/dL — ABNORMAL HIGH (ref 70–99)
Potassium: 3.6 mmol/L (ref 3.5–5.1)
Sodium: 139 mmol/L (ref 135–145)
Total Bilirubin: 0.8 mg/dL (ref 0.3–1.2)
Total Protein: 6.3 g/dL — ABNORMAL LOW (ref 6.5–8.1)

## 2022-09-18 LAB — CBC
HCT: 30 % — ABNORMAL LOW (ref 36.0–46.0)
Hemoglobin: 9.7 g/dL — ABNORMAL LOW (ref 12.0–15.0)
MCH: 30.3 pg (ref 26.0–34.0)
MCHC: 32.3 g/dL (ref 30.0–36.0)
MCV: 93.8 fL (ref 80.0–100.0)
Platelets: 156 10*3/uL (ref 150–400)
RBC: 3.2 MIL/uL — ABNORMAL LOW (ref 3.87–5.11)
RDW: 20 % — ABNORMAL HIGH (ref 11.5–15.5)
WBC: 4 10*3/uL (ref 4.0–10.5)
nRBC: 0 % (ref 0.0–0.2)

## 2022-09-18 LAB — TISSUE TRANSGLUTAMINASE, IGA: Tissue Transglutaminase Ab, IgA: <2 U/mL (ref 0–3)

## 2022-09-18 LAB — GLUCOSE, CAPILLARY
Glucose-Capillary: 137 mg/dL — ABNORMAL HIGH (ref 70–99)
Glucose-Capillary: 101 mg/dL — ABNORMAL HIGH (ref 70–99)

## 2022-09-18 LAB — GRAM STAIN

## 2022-09-18 LAB — CULTURE, BLOOD (ROUTINE X 2)
Culture: NO GROWTH
Culture: NO GROWTH
Special Requests: ADEQUATE
Special Requests: ADEQUATE

## 2022-09-18 LAB — IGA: IgA: 937 mg/dL — ABNORMAL HIGH (ref 87–352)

## 2022-09-18 LAB — AFP TUMOR MARKER: AFP, Serum, Tumor Marker: 5.1 ng/mL (ref 0.0–9.2)

## 2022-09-18 LAB — CYTOLOGY - NON PAP

## 2022-09-18 MED ORDER — FUROSEMIDE 40 MG PO TABS
40.0000 mg | ORAL_TABLET | Freq: Every day | ORAL | 0 refills | Status: DC
  Filled 2022-09-18: qty 30, 30d supply, fill #0

## 2022-09-18 MED ORDER — SPIRONOLACTONE 100 MG PO TABS
100.0000 mg | ORAL_TABLET | Freq: Every day | ORAL | 0 refills | Status: DC
  Filled 2022-09-18: qty 30, 30d supply, fill #0

## 2022-09-18 NOTE — TOC Initial Note (Signed)
Transition of Care [facility]) - Initial/Assessment Note    Patient Details  Name: [name] MRN: [id number] Date of Birth: 07-May-1956  [facility]) CM/SW Contact:    Curlene Labrum, RN Phone Number: 09/18/2022, 10:49 AM  Clinical Narrative:                 Cm met with the patient at the bedside and patient states that she lives with her son at the home and will likely discharge home tomorrow after medical work up.  The patient is independent and has a Soil scientist cane but the Quad cane is now broken.  The patient was offered choice regarding DME company and she did not have a preference.  I called [employer] and placed a DME order.  [employer] will deliver the QUAD cane to the room today.  Family to provide transport to home by car when she is medically ready for discharge.  Expected Discharge Plan: Home/Self Care Barriers to Discharge: Continued Medical Work up   Patient Goals and CMS Choice Patient states their goals for this hospitalization and ongoing recovery are:: To return home CMS Medicare.gov Compare Post Acute Care list provided to:: Patient Choice offered to / list presented to : Patient [place] ownership interest in [facility].provided to:: Patient    Expected Discharge Plan and Services   Discharge Planning Services: CM Consult Post Acute Care Choice: Durable Medical Equipment Living arrangements for the past 2 months: Mobile Home Expected Discharge Date: 09/18/22               DME Arranged: Kasandra Knudsen DME Agency: [employer] Date DME Agency Contacted: 09/18/22 Time DME Agency Contacted: [phone] Representative spoke with at DME Agency: Brenton Grills, CM with [employer]            Prior Living Arrangements/Services Living arrangements for the past 2 months: Mobile Home Lives with:: Adult Children (adult son lives with the patient at home) Patient language and need for interpreter reviewed:: Yes Do you feel safe going back to the place  where you live?: Yes      Need for Family Participation in Patient Care: Yes (Comment) Care giver support system in place?: Yes (comment) Current home services: DME (Rolator and broken quad cane at the home - ordering quad cane to be delivered.) Criminal Activity/Legal Involvement Pertinent to Current Situation/Hospitalization: No - Comment as needed  Activities of Daily Living Home Assistive Devices/Equipment: Cane (specify quad or straight) (quade cane) ADL Screening (condition at time of admission) Patient's cognitive ability adequate to safely complete daily activities?: Yes Is the patient deaf or have difficulty hearing?: No Does the patient have difficulty seeing, even when wearing glasses/contacts?: No Does the patient have difficulty concentrating, remembering, or making decisions?: No Patient able to express need for assistance with ADLs?: Yes Does the patient have difficulty dressing or bathing?: No Independently performs ADLs?: Yes (appropriate for developmental age) Communication: Independent Dressing (OT): Independent Grooming: Independent Feeding: Independent Bathing: Independent Toileting: Independent In/Out Bed: Independent Walks in Home: Independent Does the patient have difficulty walking or climbing stairs?: No (uses cane) Weakness of Legs: None Weakness of Arms/Hands: None  Permission Sought/Granted Permission sought to share information with : Case Manager Permission granted to share information with : Yes, Verbal Permission Granted     Permission granted to share info w AGENCY: [place] - ordered [employer] thru [employer] granted to share info w Relationship: family     Emotional Assessment Appearance:: Appears  stated age Attitude/Demeanor/Rapport: Gracious Affect (typically observed): Accepting Orientation: : Oriented to Self, Oriented to Place, Oriented to  Time, Oriented to Situation Alcohol / Substance Use: Not Applicable Psych Involvement: No  (comment)  Admission diagnosis:  Decompensated liver disease ([place]) [K74.69] Patient Active Problem List   Diagnosis Date Noted   Decompensated liver disease ([place]) 09/16/2022   Hypervolemia 09/16/2022   COPD (chronic obstructive pulmonary disease) ([place]) 08/29/2022   Allergic rhinitis 08/29/2022   Epistaxis 08/28/2022   Hypokalemia 08/28/2022   Chronic venous insufficiency 05/15/2022   Murmur 05/15/2022   Infection of lip 06/26/2021   Polypharmacy 02/25/2021   Lump on neck 05/19/2020   Dementia ([place]) 03/12/2020   COPD with chronic bronchitis 01/30/2020   Adenomatous polyp 10/14/2019   Delayed wound healing 09/07/2019   History of lumbar laminectomy for spinal cord decompression 08/26/2019   Mixed Alzheimer's and vascular dementia ([place]) 08/28/2017   Memory loss 08/10/2017   Urge incontinence 07/25/2016   Impaired functional mobility, balance, and endurance 07/24/2016   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/15/2015   DM2 (diabetes mellitus, type 2) ([place]) 04/26/2015   Benign paroxysmal positional vertigo 01/24/2015   Type 2 diabetes mellitus with diabetic neuropathy, unspecified ([place]) 04/26/2014   Vitamin D deficiency 07/11/2013   Mood disorder ([place]) 07/11/2013   Osteopenia 11/04/2012   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   Chronic pain syndrome 04/26/2010   Thyromegaly 09/17/2006   Morbid obesity ([place]) 09/17/2006   GERD (gastroesophageal reflux disease) 09/17/2006   PCP:  Ezequiel Essex, MD Pharmacy:   [id number] [id number]: [phone]: [phone] [facility] [id number]: [phone]: [phone]    Social Determinants of Health (SDOH) Social History: SDOH Screenings   Food Insecurity: No Food Insecurity (09/16/2022)   Housing: Low Risk  (09/16/2022)  Transportation Needs: No Transportation Needs (09/16/2022)  Utilities: Not At Risk (09/16/2022)  Depression (PHQ2-9): High Risk (09/16/2022)  Financial Resource Strain: Low Risk  (09/21/2018)  Physical Activity: Insufficiently Active (09/21/2018)  Social Connections: Unknown (09/21/2018)  Stress: No Stress Concern Present (09/21/2018)  Tobacco Use: Low Risk  (09/17/2022)   SDOH Interventions:     Readmission Risk Interventions     No data to display

## 2022-09-18 NOTE — Discharge Summary (Signed)
[facility] Discharge Summary  Patient name: [name] record number: [id number] Date of birth: 15-May-1956 Age: 67 y.o. Gender: female Date of Admission: 09/16/2022  Date of Discharge: 09/18/2022 Admitting Physician: Eppie Gibson, MD  Primary Care Provider: Ezequiel Essex, MD Consultants: GI  Indication for Hospitalization: Decompensated Liver disease  Brief Hospital Course:  [name] is a 67 year old female who presented with ascites and SOB likely in the setting of decompensated liver cirrhosis.  Hospital course outlined below.  Decompensated liver disease ([place]) Presented to PCP office on 09/16/2022 and found to have significant ascites with shortness of breath.  Direct admission to [facility].  RUQ ultrasound significant for hepatic fatty infiltration, cirrhotic appearance of liver and large volume ascites.  CT obtained to rule out PE with SOB, no pulmonary embolism but was significant for mild cardiomegaly (see below), cirrhotic liver with splenomegaly and upper abdominal ascites.  Underwent extensive workup for sources of liver disease, which were unremarkable including (ANA, hepatitis C/B, iron studies, antimitochondrial, anti-smooth muscle).  Ceruloplasmin mildly decreased.  Paracentesis performed-removed 2.3 L of clear yellow liquid.  Ascites fluid labs were unremarkable, pointed towards liver source-no concern of SBP.  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  Patient was started on 40 mg p.o. Lasix and continued on spironolactone-had appropriate UOP.  SOB resolved.  Patient was medically stable and in good condition at time of discharge, with close PCP and GI follow-up.  Murmur Presented with 3/6 systolic murmur.  With ascites and SOB there was concern of acute CHF.  Echocardiogram obtained which showed LVEF of [id number], grade 1 diastolic dysfunction, and mild  stenosis of aortic valve.  At this time do not believe CHF is a contributing factor in her ascites.  May benefit from future cardiology, not urgent.  Anemia associated with acute blood loss Admitted on 08/28/2022 for epistaxis which resulted in anemia requiring 2 units of packed red blood cells.  She was started on p.o. iron and given 1 dose of IV Feraheme prior to discharge.  Discharge hemoglobin at that time was 7.8.  During this admission hemoglobin was stable with levels between 9-10.  Patient will continue iron supplementation at home.  Follow-up with PCP.  Other chronic conditions were medically managed with home medications and formulary alternatives as necessary (T2DM, Chronic pain, mood disorder)   Follow-up recommendations Recommend repeat CMP for electrolytes and liver enzymes Ensure follow-up with GI May need future cardiology referral related to valve disease and diastolic dysfunction  Discharge Diagnoses/Problem List:  * Decompensated liver disease ([place]) Murmur Anemia associated with acute blood loss DM2 (diabetes mellitus, type 2) ([place])  Disposition: Home  Discharge Condition: Stable  Discharge Exam:  General: NAD, chronically appearing, resting comfortably in bed Cardiovascular: RRR, 3/6 systolic murmur Respiratory: CTAB, normal work of breathing on room air Abdomen: Soft, not tender, not distended.  Bowel sounds present Extremities: No edema.  Moves all 4 extremities. Skin: Warm and dry  Significant Procedures: IR paracentesis  Significant Labs and Imaging:  Recent Labs  Lab 09/17/22 0027 09/18/22 0509  WBC 4.9 4.0  HGB 9.8* 9.7*  HCT 30.0* 30.0*  PLT 152 156   Recent Labs  Lab 09/17/22 0027 09/17/22 1530 09/18/22 0509  NA 138  --  139  K 4.5  --  3.6  CL 104  --  103  CO2 25  --  26  GLUCOSE 89  --  106*  BUN 12  --  10  CREATININE 0.67  --  0.67  CALCIUM 7.9*  --  8.2*  ALKPHOS  --   --  93  AST  --   --  36  ALT  --   --  29  ALBUMIN  --   2.2* 2.0*   ECHOCARDIOGRAM COMPLETE Result Date: 09/17/2022 IMPRESSIONS  1. Left ventricular ejection fraction, by estimation, is 65 to 70%. The left ventricle has normal function. The left ventricle has no regional wall motion abnormalities. Left ventricular diastolic parameters are consistent with Grade I diastolic dysfunction (impaired relaxation).  2. Right ventricular systolic function is normal. The right ventricular size is normal. There is mildly elevated pulmonary artery systolic pressure. The estimated right ventricular systolic pressure is 99991111 mmHg.  3. Left atrial size was mildly dilated.  4. Right atrial size was mildly dilated.  5. The mitral valve is normal in structure. No evidence of mitral valve regurgitation. No evidence of mitral stenosis.  6. The aortic valve is tricuspid. There is moderate calcification of the aortic valve. Aortic valve regurgitation is not visualized. Mild aortic valve stenosis. Aortic valve area, by VTI measures 1.51 cm. Aortic valve mean gradient measures 17.0 mmHg. Aortic valve Vmax measures 2.70 m/s.  7. The inferior vena cava is normal in size with greater than 50% respiratory variability, suggesting right atrial pressure of 3 mmHg.   US Abdomen Limited RUQ (LIVER/GB) Result Date: 09/16/2022 IMPRESSION: 1. Hepatic fatty infiltration 2. Cirrhotic appearance of the liver. 3. Large ascites.   Results/Tests Pending at Time of Discharge: Tissue transglutaminase, IgA, AFP tumor marker. Discharge Medications:  Allergies as of 09/18/2022       Reactions   Pregabalin Nausea And Vomiting   Hydrocodone-acetaminophen Nausea And Vomiting        Medication List     STOP taking these medications    cephALEXin 500 MG capsule Commonly known as: KEFLEX   doxycycline 100 MG capsule Commonly known as: VIBRAMYCIN   potassium chloride SA 20 MEQ tablet Commonly known as: KLOR-CON M       TAKE these medications    Accu-Chek FastClix Lancets Misc USE TO  CHECK BLOOD SUGAR THREE TIMES DAILY   Accu-Chek Guide test strip Generic drug: glucose blood USE  TO CHECK BLOOD SUGAR THREE TIMES DAILY   Accu-Chek Guide test strip Generic drug: glucose blood 3 (three) times daily   Accu-Chek Guide w/Device Kit 1 Device by Does not apply route in the morning, at noon, and at bedtime.   albuterol (2.5 MG/3ML) 0.083% nebulizer solution Commonly known as: PROVENTIL Take 3 mLs (2.5 mg total) by nebulization every 4 (four) hours as needed for wheezing.   albuterol 108 (90 Base) MCG/ACT inhaler Commonly known as: VENTOLIN HFA Inhale 1-2 puffs into the lungs every 6 (six) hours as needed for wheezing.   atorvastatin 10 MG tablet Commonly known as: LIPITOR TAKE 1 TABLET (10 MG TOTAL) BY MOUTH DAILY. PLEASE RETURN TO CLINIC FOR CHOLESTEROL CHECK. What changed: additional instructions   B-D SINGLE USE SWABS REGULAR Pads USE  TO  CLEAN  SKIN  BEFORE CHECKING BLOOD SUGAR   CALCIUM-D PO Take 1 tablet by mouth daily.   Capsaicin 0.05 % Crea Apply to feet twice daily. What changed:  how much to take how to take this when to take this reasons to take this additional instructions   [employer] SPECTRAVITE WOMENS SENIOR PO Take 1 tablet by mouth daily.   DULoxetine 60 MG  capsule Commonly known as: CYMBALTA TAKE 1 CAPSULE BY MOUTH EVERY DAY What changed: how much to take   empagliflozin 25 MG Tabs tablet Commonly known as: JARDIANCE Take 1 tablet (25 mg total) by mouth daily.   ferrous gluconate 324 MG tablet Commonly known as: FERGON Take 1 tablet (324 mg total) by mouth daily with breakfast.   fluticasone 50 MCG/ACT nasal spray Commonly known as: FLONASE SPRAY 2 SPRAYS INTO EACH NOSTRIL EVERY DAY What changed: See the new instructions.   furosemide 40 MG tablet Commonly known as: LASIX Take 1 tablet (40 mg total) by mouth daily. Start taking on: September 19, 2022   gabapentin 800 MG tablet Commonly known as: NEURONTIN Take 800 mg by mouth 2  (two) times daily.   ketoconazole 2 % shampoo Commonly known as: NIZORAL APPLY TO AFFECTED AREA TOPICALLY 2 (TWO) TIMES A WEEK. What changed: See the new instructions.   loratadine 10 MG tablet Commonly known as: CLARITIN Take 10 mg by mouth daily.   meclizine 12.5 MG tablet Commonly known as: ANTIVERT TAKE 1 TABLET TWICE DAILY AS NEEDED FOR DIZZINESS. What changed: See the new instructions.   Medihoney Ca Alginate 2"x2" Pads   metFORMIN 1000 MG tablet Commonly known as: GLUCOPHAGE Take 1 tablet (1,000 mg total) by mouth 2 (two) times daily with a meal.   methocarbamol 500 MG tablet Commonly known as: ROBAXIN Take 500 mg by mouth in the morning and at bedtime.   omeprazole 20 MG capsule Commonly known as: PRILOSEC TAKE 1 CAPSULE BY MOUTH EVERY DAY IN THE EVENING What changed: See the new instructions.   ondansetron 4 MG tablet Commonly known as: ZOFRAN TAKE 1 TABLET BY MOUTH EVERY 8 HOURS AS NEEDED FOR NAUSEA AND VOMITING What changed: See the new instructions.   oxyCODONE 5 MG immediate release tablet Commonly known as: Oxy IR/ROXICODONE Take 5 mg by mouth every 6 (six) hours as needed for moderate pain.   spironolactone 100 MG tablet Commonly known as: ALDACTONE Take 1 tablet (100 mg total) by mouth daily. Start taking on: September 19, 2022               Durable Medical Equipment  (From admission, onward)           Start     Ordered   09/18/22 1044  For home use only DME Other see comment  Once       Comments: Needs QUAD cane  Question:  Length of Need  Answer:  Lifetime   09/18/22 1044            Discharge Instructions: Please refer to Patient Instructions section of EMR for full details.  Patient was counseled important signs and symptoms that should prompt return to medical care, changes in medications, dietary instructions, activity restrictions, and follow up appointments.   Follow-Up Appointments:  Follow-up Information     [facility], [place] Follow up.   Why: [employer] will deliver a Quad cane to the hospital room before discharging home. Contact information: [address] [place] [phone]         Ezequiel Essex, MD. Go on 09/24/2022.   Specialty: Family Medicine Why: You will see a colleague of Dr. Jeani Hawking, Dr. Gwendolyn Lima.  Your appointment is 09/24/2022 at 10:45 AM. Contact information: [place] [address] [phone]         [facility]  Gastroenterology Research. Go on 10/16/2022.   Specialty: Gastroenterology Why: Please go to GI appointment  on 10/08/2022 at 10:30 AM. If you are unable to make this appointment, please call ahead and reschedule. Contact information: [place] [phone] [place], [place], DO 09/18/2022, 2:09 PM PGY-1, [place]

## 2022-09-18 NOTE — Progress Notes (Signed)
Patient verbalized that it was okay for this nurse to dispose of old broken quad cane. This nurse disposed of quad cane in dirty utility room trash receptacle

## 2022-09-18 NOTE — Discharge Instructions (Addendum)
Dear [name],  Thank you for letting us participate in your care. You were hospitalized for fluid retention and shortness of breath and diagnosed with Decompensated liver disease ([place]). You were treated with paracentesis and Lasix.   POST-HOSPITAL & CARE INSTRUCTIONS Please continue Lasix 40 mg once daily to keep fluid off Please continue taking spironolactone and iron supplement Please avoid salt, do not add extra salt to food Go to your follow up appointments (listed below)   DOCTOR'S APPOINTMENT   Future Appointments  Date Time Provider [place]  09/24/2022 10:45 AM Lowry Ram, MD [facility]  10/16/2022 10:30 AM Zehr, Laban Emperor, PA-C [facility]    Follow-up Information     [facility], [place] Follow up.   Why: [employer] will deliver a Quad cane to the hospital room before discharging home. Contact information: [address] [place] [phone]         Ezequiel Essex, MD. Go on 09/24/2022.   Specialty: Family Medicine Why: You will see a colleague of Dr. Jeani Hawking, Dr. Gwendolyn Lima.  Your appointment is 09/24/2022 at 10:45 AM. Contact information: [place] [address] [phone]         [facility]  Gastroenterology Research. Go on 10/16/2022.   Specialty: Gastroenterology Why: Please go to GI appointment on 10/08/2022 at 10:30 AM. If you are unable to make this appointment, please call ahead and reschedule. Contact information: [place] [phone]                Take care and be well!  [facility]  [place], [place] [address] [phone]

## 2022-09-18 NOTE — Progress Notes (Signed)
     Daily Progress Note Intern Pager: [phone]  Patient name: [place] record number: [id number] Date of birth: [birth year] Age: 67 y.o. Gender: female  Primary Care Provider: Ezequiel Essex, MD Consultants: GI Code Status: Full code  Pt Overview and Major Events to Date:  ANNALEAH WAREING is a 67 year old female presenting with volume overload and shortness of breath likely 2/2 decompensated liver cirrhosis.  Differential management as below.  Assessment and Plan:  * Decompensated liver disease ([place]) Improved ascites after paracentesis, now on p.o. Lasix and 2 g sodium diet.  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  Recommend outpatient EGD for varices screening.  Ascites fluid suggestive of liver etiology-with out concern for SBP.  Patient will need GI follow-up outpatient.  Will restart statin at discharge. - GI consulted, appreciate recommendations - Spironolactone 166m daily and Lasix 424mdaily - Daily weights - Strict I/O  Murmur Echocardiogram showed LVEF of 65-70%, with grade 1 diastolic dysfunction.  Aortic stenosis present-which probably explains her systolic murmur radiating to carotids.  Do not believe heart failure is primary cause of ascites.  Anemia associated with acute blood loss  Hemoglobin stable at 9.7.  Initially held off on pharmacologic DVT prophylaxis, but restarted Lovenox last night.  Recommend follow-up with PCP.  DM2 (diabetes mellitus, type 2) (H[facility]) Stable. - Continue Jardiance - Restart metformin at discharge  FEN/GI: Heart healthy, 2 g sodium PPx: Lovenox Dispo: Home today/tomorrow, pending need for further medical workup  Subjective:  Patient reports being back to baseline.  Objective: Temp:  [98.5 F (36.9 C)-98.7 F (37.1 C)] 98.5 F (36.9 C) (02/29 0805) Pulse Rate:  [76-91] 81 (02/29 0805) Resp:  [14-18] 18 (02/29 0805) BP: (116-139)/(65-75) 116/69 (02/29 0805) SpO2:  [94 %-97 %] 95 % (02/29 0805) Physical Exam: General: NAD,  chronically appearing, resting comfortably in bed Cardiovascular: RRR, 3/6 systolic murmur Respiratory: CTAB, normal work of breathing on room air Abdomen: Soft, not tender, not distended.  Bowel sounds present Extremities: No edema.  Moves all 4 extremities. Skin: Warm and dry  Laboratory: Most recent CBC Lab Results  Component Value Date   WBC 4.0 09/18/2022   HGB 9.7 (L) 09/18/2022   HCT 30.0 (L) 09/18/2022   MCV 93.8 09/18/2022   PLT 156 09/18/2022   Most recent BMP    Latest Ref Rng & Units 09/18/2022    5:09 AM  BMP  Glucose 70 - 99 mg/dL 106   BUN 8 - 23 mg/dL 10   Creatinine 0.44 - 1.00 mg/dL 0.67   Sodium 135 - 145 mmol/L 139   Potassium 3.5 - 5.1 mmol/L 3.6   Chloride 98 - 111 mmol/L 103   CO2 22 - 32 mmol/L 26   Calcium 8.9 - 10.3 mg/dL 8.2    BrLeslie DalesDO 09/18/2022, 9:54 AM  PGY-1, [place] pager: [phone] pages welcome Secure chat group [facility] [place]

## 2022-09-18 NOTE — Progress Notes (Signed)
Gastroenterology Inpatient Follow Up    Subjective: Feels well this morning.  Still has some abdominal distention but this is much improved since her admission.  She feels that her lower extremity edema has improved significantly as well.   Objective: Vital signs in last 24 hours: Temp:  [98.5 F (36.9 C)-98.7 F (37.1 C)] 98.5 F (36.9 C) (02/29 0805) Pulse Rate:  [76-91] 81 (02/29 0805) Resp:  [14-18] 18 (02/29 0805) BP: (116-136)/(65-75) 116/69 (02/29 0805) SpO2:  [94 %-97 %] 95 % (02/29 0805)    Intake/Output from previous day: 02/28 0701 - 02/29 0700 In: 480 [P.O.:480] Out: 4150 [Urine:4150] Intake/Output this shift: Total I/O In: -  Out: 650 [Urine:650]  General appearance: alert and cooperative Resp: no increased WOB Cardio: regular rate GI: distended, non-tender, soft Extremities: trace lower extremity edema  Lab Results: Recent Labs    09/16/22 1139 09/17/22 0027 09/18/22 0509  WBC 4.6 4.9 4.0  HGB 9.5* 9.8* 9.7*  HCT 29.9* 30.0* 30.0*  PLT 173 152 156   BMET Recent Labs    09/16/22 1139 09/17/22 0027 09/18/22 0509  NA 139 138 139  K 2.9* 4.5 3.6  CL 105 104 103  CO2 '27 25 26  '$ GLUCOSE 96 89 106*  BUN '9 12 10  '$ CREATININE 0.58 0.67 0.67  CALCIUM 8.3* 7.9* 8.2*   LFT Recent Labs    09/18/22 0509  PROT 6.3*  ALBUMIN 2.0*  AST 36  ALT 29  ALKPHOS 93  BILITOT 0.8   PT/INR Recent Labs    09/16/22 1139  LABPROT 17.4*  INR 1.4*   Hepatitis Panel Recent Labs    09/16/22 1139  HEPBSAG NON REACTIVE  HCVAB NON REACTIVE  HEPAIGM NON REACTIVE  HEPBIGM NON REACTIVE   C-Diff No results for input(s): "CDIFFTOX" in the last 72 hours.  Studies/Results: IR Paracentesis  Result Date: 09/17/2022 INDICATION: Patient with cirrhosis and new onset ascites. Interventional radiology asked to perform a diagnostic and therapeutic paracentesis. EXAM: ULTRASOUND GUIDED PARACENTESIS MEDICATIONS: % lidocaine 10 mL COMPLICATIONS: None immediate.  PROCEDURE: Informed written consent was obtained from the patient after a discussion of the risks, benefits and alternatives to treatment. A timeout was performed prior to the initiation of the procedure. Initial ultrasound scanning demonstrates a large amount of ascites within the right lower abdominal quadrant. The right lower abdomen was prepped and draped in the usual sterile fashion. 1% lidocaine was used for local anesthesia. Following this, a 19 gauge, 7-cm, Yueh catheter was introduced. An ultrasound image was saved for documentation purposes. The paracentesis was performed. The catheter was removed and a dressing was applied. The patient tolerated the procedure well without immediate post procedural complication. FINDINGS: A total of approximately 2.3 L of clear yellow fluid was removed. Samples were sent to the laboratory as requested by the clinical team. IMPRESSION: Successful ultrasound-guided paracentesis yielding 2.3 liters of peritoneal fluid. Read by: Soyla Dryer, NP PLAN: If the patient eventually requires >/=2 paracenteses in a 30 day period, candidacy for formal evaluation by the [facility] Interventional Radiology Portal Hypertension [facility] will be assessed. Electronically Signed   By: Corrie Mckusick D.O.   On: 09/17/2022 12:57   ECHOCARDIOGRAM COMPLETE  Result Date: 09/17/2022    ECHOCARDIOGRAM REPORT   Patient Name:   [name] Date of Exam: 09/17/2022 Medical Rec #:  [id number]      Height:       64.0 in Accession #:    [id number]     Weight:  210.1 lb Date of Birth:  23-Jul-1955       BSA:          1.998 m Patient Age:    67 years       BP:           129/73 mmHg Patient Gender: F              HR:           86 bpm. Exam Location:  Inpatient Procedure: 2D Echo, Color Doppler and Cardiac Doppler Indications:    Murmur  History:        Patient has prior history of Echocardiogram examinations, most                 recent 05/26/2022. Stroke and COPD, Signs/Symptoms:Alzheimer's;                  Risk Factors:Hypertension, Diabetes and Obesity.  Sonographer:    Eartha Inch Referring Phys: [id number] [place] Comments: Image acquisition challenging due to patient body habitus. IMPRESSIONS  1. Left ventricular ejection fraction, by estimation, is 65 to 70%. The left ventricle has normal function. The left ventricle has no regional wall motion abnormalities. Left ventricular diastolic parameters are consistent with Grade I diastolic dysfunction (impaired relaxation).  2. Right ventricular systolic function is normal. The right ventricular size is normal. There is mildly elevated pulmonary artery systolic pressure. The estimated right ventricular systolic pressure is 99991111 mmHg.  3. Left atrial size was mildly dilated.  4. Right atrial size was mildly dilated.  5. The mitral valve is normal in structure. No evidence of mitral valve regurgitation. No evidence of mitral stenosis.  6. The aortic valve is tricuspid. There is moderate calcification of the aortic valve. Aortic valve regurgitation is not visualized. Mild aortic valve stenosis. Aortic valve area, by VTI measures 1.51 cm. Aortic valve mean gradient measures 17.0 mmHg. Aortic valve Vmax measures 2.70 m/s.  7. The inferior vena cava is normal in size with greater than 50% respiratory variability, suggesting right atrial pressure of 3 mmHg. FINDINGS  Left Ventricle: Left ventricular ejection fraction, by estimation, is 65 to 70%. The left ventricle has normal function. The left ventricle has no regional wall motion abnormalities. The left ventricular internal cavity size was normal in size. There is  no left ventricular hypertrophy. Left ventricular diastolic parameters are consistent with Grade I diastolic dysfunction (impaired relaxation). Right Ventricle: The right ventricular size is normal. No increase in right ventricular wall thickness. Right ventricular systolic function is normal. There is mildly elevated pulmonary  artery systolic pressure. The tricuspid regurgitant velocity is 2.71  m/s, and with an assumed right atrial pressure of 5 mmHg, the estimated right ventricular systolic pressure is 99991111 mmHg. Left Atrium: Left atrial size was mildly dilated. Right Atrium: Right atrial size was mildly dilated. Pericardium: There is no evidence of pericardial effusion. Mitral Valve: The mitral valve is normal in structure. No evidence of mitral valve regurgitation. No evidence of mitral valve stenosis. MV peak gradient, 2.9 mmHg. The mean mitral valve gradient is 2.0 mmHg. Tricuspid Valve: The tricuspid valve is normal in structure. Tricuspid valve regurgitation is mild . No evidence of tricuspid stenosis. Aortic Valve: The aortic valve is tricuspid. There is moderate calcification of the aortic valve. Aortic valve regurgitation is not visualized. Mild aortic stenosis is present. Aortic valve mean gradient measures 17.0 mmHg. Aortic valve peak gradient measures 29.1 mmHg. Aortic valve area, by VTI measures 1.51  cm. Pulmonic Valve: The pulmonic valve was normal in structure. Pulmonic valve regurgitation is trivial. No evidence of pulmonic stenosis. Aorta: The aortic root is normal in size and structure. Venous: The inferior vena cava is normal in size with greater than 50% respiratory variability, suggesting right atrial pressure of 3 mmHg. IAS/Shunts: No atrial level shunt detected by color flow Doppler.  LEFT VENTRICLE PLAX 2D LVIDd:         3.70 cm      Diastology LVIDs:         2.30 cm      LV e' medial:    7.40 cm/s LV PW:         1.10 cm      LV E/e' medial:  8.7 LV IVS:        1.20 cm      LV e' lateral:   7.72 cm/s LVOT diam:     2.00 cm      LV E/e' lateral: 8.4 LV SV:         81 LV SV Index:   41 LVOT Area:     3.14 cm  LV Volumes (MOD) LV vol d, MOD A2C: 142.0 ml LV vol d, MOD A4C: 111.0 ml LV vol s, MOD A2C: 57.2 ml LV vol s, MOD A4C: 39.4 ml LV SV MOD A2C:     84.8 ml LV SV MOD A4C:     111.0 ml LV SV MOD BP:      74.6  ml RIGHT VENTRICLE RV S prime:     14.90 cm/s TAPSE (M-mode): 1.7 cm LEFT ATRIUM             Index        RIGHT ATRIUM           Index LA diam:        4.30 cm 2.15 cm/m   RA Area:     15.70 cm LA Vol (A2C):   57.0 ml 28.53 ml/m  RA Volume:   37.30 ml  18.67 ml/m LA Vol (A4C):   63.1 ml 31.59 ml/m LA Biplane Vol: 61.1 ml 30.58 ml/m  AORTIC VALVE AV Area (Vmax):    1.40 cm AV Area (Vmean):   1.35 cm AV Area (VTI):     1.51 cm AV Vmax:           269.50 cm/s AV Vmean:          199.500 cm/s AV VTI:            0.538 m AV Peak Grad:      29.1 mmHg AV Mean Grad:      17.0 mmHg LVOT Vmax:         120.00 cm/s LVOT Vmean:        85.800 cm/s LVOT VTI:          0.258 m LVOT/AV VTI ratio: 0.48  AORTA Ao Root diam: 2.70 cm Ao Asc diam:  3.60 cm MITRAL VALVE               TRICUSPID VALVE MV Area (PHT): 2.55 cm    TR Peak grad:   29.4 mmHg MV Area VTI:   3.46 cm    TR Mean grad:   20.0 mmHg MV Peak grad:  2.9 mmHg    TR Vmax:        271.00 cm/s MV Mean grad:  2.0 mmHg    TR Vmean:       212.0 cm/s MV Vmax:  0.85 m/s MV Vmean:      67.4 cm/s   SHUNTS MV Decel Time: 298 msec    Systemic VTI:  0.26 m MV E velocity: 64.50 cm/s  Systemic Diam: 2.00 cm MV A velocity: 63.90 cm/s MV E/A ratio:  1.01 Glori Bickers MD Electronically signed by Glori Bickers MD Signature Date/Time: 09/17/2022/11:36:31 AM    Final    US Abdomen Limited RUQ (LIVER/GB)  Result Date: 09/16/2022 CLINICAL DATA:  [id number] Ascites [id number] Cirrhosis ([place]) [id number] EXAM: ULTRASOUND ABDOMEN LIMITED COMPARISON:  08/18/2005. FINDINGS: Hepatic echogenicity increased consistent with fatty infiltration. No focal hepatic lesions identified. No biliary ductal dilatation. Gallbladder has been removed. There is a large ascites observed in the right upper and lower quadrants and left lower quadrant. CBD 0.7 cm. IMPRESSION: 1. Hepatic fatty infiltration 2. Cirrhotic appearance of the liver. 3. Large ascites. Electronically Signed   By: Sammie Bench M.D.    On: 09/16/2022 17:15    Medications: I have reviewed the patient's current medications. Scheduled:  DULoxetine  60 mg Oral Daily   empagliflozin  25 mg Oral Daily   enoxaparin (LOVENOX) injection  40 mg Subcutaneous Q24H   ferrous gluconate  324 mg Oral Q breakfast   fluticasone  2 spray Each Nare Daily   furosemide  40 mg Oral Daily   gabapentin  800 mg Oral BID   insulin aspart  0-9 Units Subcutaneous TID WC   loratadine  10 mg Oral Daily   methocarbamol  500 mg Oral BID   pantoprazole  40 mg Oral Daily   spironolactone  100 mg Oral Daily   Continuous: [id number]  Assessment/Plan: 67 year old female with history of DM, obesity, COPD, aortic stenosis presented with abdominal distension and BLE edema, found to have ascites and cirrhosis on imaging. Her ascites fluid suggest cirrhosis as etiology for her fluid accumulation.  Fluid studies were negative for SBP. She has had an extensive workup during this hospitalization for sources of liver disease, which have been unremarkable.  This suggests that her cirrhosis is most likely due to [facility].  She was started on diuretic therapy and had a paracentesis on 2/28 with removal of 2.3 L of fluid. -Continue low-sodium diet with less than 2 g/day. -Cont Lasix 40 mg QD and spironolactone 100 mg QD -EGD could be considered for variceal screening in the future. Platelets are borderline but she has had some thrombocytopenia in the past. -She has outpatient GI follow-up arranged   LOS: 2 days   Sharyn Creamer 09/18/2022, 12:49 PM

## 2022-09-18 NOTE — Progress Notes (Signed)
Discussed over secure chat with Dr. Lorenso Courier with gastroenterology-cleared from their standpoint for discharge. Follow up already scheduled.

## 2022-09-19 ENCOUNTER — Telehealth: Payer: Self-pay

## 2022-09-19 NOTE — Transitions of Care (Post Inpatient/ED Visit) (Signed)
   09/19/2022  Name: [name] MRN: [id number] DOB: 06-09-1956  Today's TOC FU Call Status: Today's TOC FU Call Status:: Successful TOC FU Call Competed TOC FU Call Complete Date: 09/19/22  Transition Care Management Follow-up Telephone Call Date of Discharge: 09/18/22 [name]) Type of Discharge: Inpatient Admission Primary Inpatient Discharge Diagnosis:: cirrhosis of liver How have you been since you were released from the hospital?: Better Any questions or concerns?: No  Items Reviewed: Did you receive and understand the discharge instructions provided?: Yes Medications obtained and verified?: Yes (Medications Reviewed) Any new allergies since your discharge?: No Dietary orders reviewed?: Yes Do you have support at home?: Yes People in Home: child(ren), adult  Home Care and Equipment/Supplies: [place] Ordered?: Yes Name of [place]:: [place] Has Agency set up a time to come to your home?: No Any new equipment or medical supplies ordered?: NA  Functional Questionnaire: Do you need assistance with bathing/showering or dressing?: No Do you need assistance with meal preparation?: No Do you need assistance with eating?: No Do you have difficulty maintaining continence: No Do you need assistance with getting out of bed/getting out of a chair/moving?: No Do you have difficulty managing or taking your medications?: No  Folllow up appointments reviewed: PCP Follow-up appointment confirmed?: Yes MD Provider Line Number:[phone] Given: No Date of PCP follow-up appointment?: 09/24/22 Follow-up Provider: [facility] Follow-up appointment confirmed?: NA Do you need transportation to your follow-up appointment?: No Do you understand care options if your condition(s) worsen?: Yes-patient verbalized understanding    [place], [place] Direct Dial [phone]

## 2022-09-22 LAB — CULTURE, BODY FLUID W GRAM STAIN -BOTTLE: Culture: NO GROWTH

## 2022-09-24 ENCOUNTER — Ambulatory Visit (INDEPENDENT_AMBULATORY_CARE_PROVIDER_SITE_OTHER): Payer: 59 | Admitting: Family Medicine

## 2022-09-24 ENCOUNTER — Encounter: Payer: Self-pay | Admitting: Family Medicine

## 2022-09-24 VITALS — BP 128/80 | HR 100 | Ht 64.0 in | Wt 187.0 lb

## 2022-09-24 DIAGNOSIS — K7469 Other cirrhosis of liver: Secondary | ICD-10-CM

## 2022-09-24 DIAGNOSIS — R040 Epistaxis: Secondary | ICD-10-CM | POA: Diagnosis not present

## 2022-09-24 NOTE — Progress Notes (Signed)
    SUBJECTIVE:   CHIEF COMPLAINT / HPI:   Hospital F/u I Decompensated Cirrhosis  Has a little bit of belly pain, but manageable. Says she feels just a little swollen. Taking her lasix and spironolactone daily. Says she is able to urinate very well a couple times a day but sometimes feels like she is sitting on the toilet and nothing comes out.  Mentions a small nose bleed every so often in her left nostril when she bends over. It does stop with pressure.   PERTINENT  PMH / PSH: cirrhosis, symptomatic anemia  OBJECTIVE:   BP 128/80   Pulse 100   Ht '5\' 4"'$  (1.626 m)   Wt 187 lb (84.8 kg)   SpO2 96%   BMI 32.10 kg/m   General: well appearing, in no acute distress CV: RRR, radial pulses equal and palpable, 1+  BLE edema  Resp: Normal work of breathing on room air, CTAB Abd: Soft, non tender, non distended  Neuro: Alert & Oriented x 4  MSK: requires support of hands to stand up from chair   ASSESSMENT/PLAN:   Decompensated liver disease ([place]) Patient is stable and improved from hospital. However, continues to be volume overloaded. Blood pressure is stable and does not have any abdominal pain at this time.  - CMP to check electrolytes and CBC today  - F/u in one month   Epistaxis Continues to have some mild epistaxis. Has required transfusion for profuse epistaxis in the past. It is resolving with some pressure. Most likely due to liver dysfunction and poor coagulation.  - CBC today  - F/u in one month    Health Maintenance  - Had EGD and Colonoscopy in the recent hospitalization  - Recommended medicare annual wellness visit   Lowry Ram, MD [place]

## 2022-09-24 NOTE — Patient Instructions (Addendum)
It was wonderful to see you today.  Please bring ALL of your medications with you to every visit.   Today we talked about:  Cirrhosis - I am glad you are doing better! Please go to the GI appointment to have a better idea of your liver function. We will get some labs here today and I will follow up   Please follow up in 3 months   Thank you for choosing [facility].   Please call [phone] with any questions about today's appointment.  Medicare Annual Wellness Exam Your chart indicates that you are due for your Medicare annual wellness exam.  Your insurance likes [place] to do one of these every year.  This is a nurse only visit that takes about 30 minutes to an hour.  It can be done either in person or virtually.  This is an in-depth visit that focuses on preventative care and keeping you healthy.  Somebody from our clinic will be calling you soon to get this scheduled.  Lowry Ram, MD  Family Medicine

## 2022-09-25 LAB — CBC WITH DIFFERENTIAL/PLATELET
Basophils Absolute: 0.1 10*3/uL (ref 0.0–0.2)
Basos: 1 %
EOS (ABSOLUTE): 0.2 10*3/uL (ref 0.0–0.4)
Eos: 4 %
Hematocrit: 34.1 % (ref 34.0–46.6)
Hemoglobin: 10.5 g/dL — ABNORMAL LOW (ref 11.1–15.9)
Immature Grans (Abs): 0 10*3/uL (ref 0.0–0.1)
Immature Granulocytes: 0 %
Lymphocytes Absolute: 1.6 10*3/uL (ref 0.7–3.1)
Lymphs: 25 %
MCH: 29.6 pg (ref 26.6–33.0)
MCHC: 30.8 g/dL — ABNORMAL LOW (ref 31.5–35.7)
MCV: 96 fL (ref 79–97)
Monocytes Absolute: 1 10*3/uL — ABNORMAL HIGH (ref 0.1–0.9)
Monocytes: 16 %
Neutrophils Absolute: 3.4 10*3/uL (ref 1.4–7.0)
Neutrophils: 54 %
Platelets: 193 10*3/uL (ref 150–450)
RBC: 3.55 x10E6/uL — ABNORMAL LOW (ref 3.77–5.28)
RDW: 17.7 % — ABNORMAL HIGH (ref 11.7–15.4)
WBC: 6.2 10*3/uL (ref 3.4–10.8)

## 2022-09-25 LAB — COMPREHENSIVE METABOLIC PANEL
ALT: 26 IU/L (ref 0–32)
AST: 32 IU/L (ref 0–40)
Albumin/Globulin Ratio: 0.8 — ABNORMAL LOW (ref 1.2–2.2)
Albumin: 3.2 g/dL — ABNORMAL LOW (ref 3.9–4.9)
Alkaline Phosphatase: 123 IU/L — ABNORMAL HIGH (ref 44–121)
BUN/Creatinine Ratio: 16 (ref 12–28)
BUN: 12 mg/dL (ref 8–27)
Bilirubin Total: 0.5 mg/dL (ref 0.0–1.2)
CO2: 23 mmol/L (ref 20–29)
Calcium: 9 mg/dL (ref 8.7–10.3)
Chloride: 102 mmol/L (ref 96–106)
Creatinine, Ser: 0.74 mg/dL (ref 0.57–1.00)
Globulin, Total: 4 g/dL (ref 1.5–4.5)
Glucose: 107 mg/dL — ABNORMAL HIGH (ref 70–99)
Potassium: 4.2 mmol/L (ref 3.5–5.2)
Sodium: 140 mmol/L (ref 134–144)
Total Protein: 7.2 g/dL (ref 6.0–8.5)
eGFR: 90 mL/min/{1.73_m2} (ref >59)

## 2022-09-27 NOTE — Assessment & Plan Note (Signed)
Patient is stable and improved from hospital. However, continues to be volume overloaded. Blood pressure is stable and does not have any abdominal pain at this time.  - CMP to check electrolytes and CBC today  - F/u in one month

## 2022-09-27 NOTE — Assessment & Plan Note (Signed)
Continues to have some mild epistaxis. Has required transfusion for profuse epistaxis in the past. It is resolving with some pressure. Most likely due to liver dysfunction and poor coagulation.  - CBC today  - F/u in one month

## 2022-10-16 ENCOUNTER — Ambulatory Visit: Payer: 59 | Admitting: Gastroenterology

## 2022-10-20 ENCOUNTER — Other Ambulatory Visit: Payer: Self-pay

## 2022-10-21 ENCOUNTER — Other Ambulatory Visit: Payer: Self-pay | Admitting: Family Medicine

## 2022-10-21 DIAGNOSIS — E1169 Type 2 diabetes mellitus with other specified complication: Secondary | ICD-10-CM

## 2022-10-21 MED ORDER — SPIRONOLACTONE 100 MG PO TABS: 100.0000 mg | ORAL_TABLET | Freq: Every day | ORAL | 3 refills | Status: DC

## 2022-10-21 MED ORDER — FERROUS GLUCONATE 324 (38 FE) MG PO TABS: 324.0000 mg | ORAL_TABLET | Freq: Every day | ORAL | 3 refills | Status: AC

## 2022-10-21 MED ORDER — GABAPENTIN 800 MG PO TABS: 800.0000 mg | ORAL_TABLET | Freq: Two times a day (BID) | ORAL | 3 refills | Status: DC

## 2022-10-21 MED ORDER — FUROSEMIDE 40 MG PO TABS: 40.0000 mg | ORAL_TABLET | Freq: Every day | ORAL | 1 refills | Status: DC

## 2022-10-24 ENCOUNTER — Telehealth: Payer: Self-pay | Admitting: Family Medicine

## 2022-10-24 NOTE — Telephone Encounter (Signed)
Received fax from [facility] needing chart notes for the requested glucose monitor and strips.  Patiently currently carries a diagnosis of type 2 diabetes (E11.9) and diabetic neuropathy (E11.40).  Her current diabetes medicines are Jardiance 25 mg and metformin 1000 mg twice daily.  She is currently not insulin-dependent.  She needs to be able to test her blood sugar once daily for any suspected hypoglycemic episodes.  Fayette Pho, MD

## 2022-10-27 ENCOUNTER — Other Ambulatory Visit (HOSPITAL_COMMUNITY): Payer: Self-pay | Admitting: Nurse Practitioner

## 2022-10-27 ENCOUNTER — Ambulatory Visit (HOSPITAL_COMMUNITY)
Admission: RE | Admit: 2022-10-27 | Discharge: 2022-10-27 | Disposition: A | Discharge status: Home / Self Care | Payer: 59 | Source: Ambulatory Visit | Attending: Nurse Practitioner | Admitting: Nurse Practitioner

## 2022-10-27 DIAGNOSIS — M545 Low back pain, unspecified: Secondary | ICD-10-CM

## 2022-10-27 DIAGNOSIS — M47816 Spondylosis without myelopathy or radiculopathy, lumbar region: Secondary | ICD-10-CM | POA: Insufficient documentation

## 2022-10-27 DIAGNOSIS — W19XXXA Unspecified fall, initial encounter: Secondary | ICD-10-CM | POA: Diagnosis not present

## 2022-10-27 DIAGNOSIS — M25551 Pain in right hip: Secondary | ICD-10-CM | POA: Diagnosis not present

## 2022-11-21 ENCOUNTER — Other Ambulatory Visit: Payer: Self-pay

## 2022-11-21 DIAGNOSIS — E1140 Type 2 diabetes mellitus with diabetic neuropathy, unspecified: Secondary | ICD-10-CM

## 2022-11-21 MED ORDER — EMPAGLIFLOZIN 25 MG PO TABS: 25.0000 mg | ORAL_TABLET | Freq: Every day | ORAL | 3 refills | Status: DC

## 2022-11-25 ENCOUNTER — Other Ambulatory Visit: Payer: Self-pay

## 2022-11-25 ENCOUNTER — Encounter: Payer: Self-pay | Admitting: Physician Assistant

## 2022-11-25 DIAGNOSIS — F39 Unspecified mood [affective] disorder: Secondary | ICD-10-CM

## 2022-11-25 DIAGNOSIS — G894 Chronic pain syndrome: Secondary | ICD-10-CM

## 2022-11-25 DIAGNOSIS — E119 Type 2 diabetes mellitus without complications: Secondary | ICD-10-CM

## 2022-11-25 MED ORDER — METHOCARBAMOL 500 MG PO TABS: 500.0000 mg | ORAL_TABLET | Freq: Two times a day (BID) | ORAL | 2 refills | Status: DC | PRN

## 2022-11-25 MED ORDER — DULOXETINE HCL 60 MG PO CPEP: 60.0000 mg | ORAL_CAPSULE | Freq: Every day | ORAL | 3 refills | Status: DC

## 2022-11-25 MED ORDER — METFORMIN HCL 1000 MG PO TABS: 1000.0000 mg | ORAL_TABLET | Freq: Two times a day (BID) | ORAL | 3 refills | Status: DC

## 2022-12-09 ENCOUNTER — Other Ambulatory Visit: Payer: Self-pay

## 2022-12-09 NOTE — Telephone Encounter (Signed)
Patient calls nurse line requesting a refill on Oxycodone.   Per chart review this medication has not been refilled since 2022.  Patient reports her bottle states last refill was 10/06/2022.  She reports she could not find the prescriber information on the bottle.   Will forward to PCP.

## 2022-12-11 NOTE — Telephone Encounter (Signed)
PDMP reviewed, it appears patient is receiving regular oxycodone #60 tablets monthly through Ms. Charlesetta Shanks, NP at [facility] pain clinic. Attempted to forward note to her, however, she is not in our Epic system.   Patient will need to call the pain clinic directly for refill.   Would recommend caution given recent hospitalization in February for decompensated liver disease. Recommend she advise her pain clinic of this diagnosis.   Fayette Pho, MD

## 2022-12-12 NOTE — Telephone Encounter (Signed)
Called patient, however no answer.   VM left to return my call in regards to pain medication.

## 2022-12-31 ENCOUNTER — Other Ambulatory Visit: Payer: Self-pay | Admitting: Family Medicine

## 2022-12-31 DIAGNOSIS — K219 Gastro-esophageal reflux disease without esophagitis: Secondary | ICD-10-CM

## 2023-01-07 ENCOUNTER — Other Ambulatory Visit: Payer: Self-pay

## 2023-01-07 ENCOUNTER — Other Ambulatory Visit: Payer: Self-pay | Admitting: Family Medicine

## 2023-01-07 MED ORDER — ALBUTEROL SULFATE HFA 108 (90 BASE) MCG/ACT IN AERS: 1.0000 | INHALATION_SPRAY | Freq: Four times a day (QID) | RESPIRATORY_TRACT | 2 refills | Status: DC | PRN

## 2023-01-07 MED ORDER — ALBUTEROL SULFATE (2.5 MG/3ML) 0.083% IN NEBU: 2.5000 mg | INHALATION_SOLUTION | RESPIRATORY_TRACT | 2 refills | Status: DC | PRN

## 2023-01-07 NOTE — Addendum Note (Signed)
Addended by: Steva Colder on: 01/07/2023 10:04 AM   Modules accepted: Orders

## 2023-01-09 MED ORDER — ALBUTEROL SULFATE (2.5 MG/3ML) 0.083% IN NEBU: 2.5000 mg | INHALATION_SOLUTION | RESPIRATORY_TRACT | 2 refills | Status: AC | PRN

## 2023-01-09 MED ORDER — ALBUTEROL SULFATE HFA 108 (90 BASE) MCG/ACT IN AERS: 1.0000 | INHALATION_SPRAY | Freq: Four times a day (QID) | RESPIRATORY_TRACT | 2 refills | Status: DC | PRN

## 2023-01-09 NOTE — Telephone Encounter (Signed)
Patient calls nurse line requesting prescriptions be transferred to [facility].   She requests we remove all other pharmacies from her profile.   I have resent the medication to [facility].

## 2023-01-09 NOTE — Addendum Note (Signed)
Addended by: Steva Colder on: 01/09/2023 03:10 PM   Modules accepted: Orders

## 2023-02-05 ENCOUNTER — Ambulatory Visit: Payer: 59 | Admitting: Physician Assistant

## 2023-02-05 ENCOUNTER — Ambulatory Visit (INDEPENDENT_AMBULATORY_CARE_PROVIDER_SITE_OTHER): Payer: 59 | Admitting: Student

## 2023-02-05 ENCOUNTER — Encounter: Payer: Self-pay | Admitting: Student

## 2023-02-05 VITALS — BP 115/68 | HR 100 | Ht 64.0 in | Wt 190.0 lb

## 2023-02-05 DIAGNOSIS — K7460 Unspecified cirrhosis of liver: Secondary | ICD-10-CM | POA: Diagnosis not present

## 2023-02-05 DIAGNOSIS — H8110 Benign paroxysmal vertigo, unspecified ear: Secondary | ICD-10-CM | POA: Diagnosis not present

## 2023-02-05 DIAGNOSIS — K7469 Other cirrhosis of liver: Secondary | ICD-10-CM

## 2023-02-05 DIAGNOSIS — F0150 Vascular dementia without behavioral disturbance: Secondary | ICD-10-CM

## 2023-02-05 DIAGNOSIS — G309 Alzheimer's disease, unspecified: Secondary | ICD-10-CM | POA: Diagnosis not present

## 2023-02-05 DIAGNOSIS — F0280 Dementia in other diseases classified elsewhere without behavioral disturbance: Secondary | ICD-10-CM

## 2023-02-05 DIAGNOSIS — K7290 Hepatic failure, unspecified without coma: Secondary | ICD-10-CM

## 2023-02-05 NOTE — Progress Notes (Signed)
    SUBJECTIVE:   CHIEF COMPLAINT / HPI:   [name] is a 67 year-old female presenting at [facility] clinic for follow-up of liver disease with history of epistaxis.  She has been spitting blood, started about 2 months ago. No nose bleeds currently. Happens pretty often.  Denies fevers, chills.  She has been dizzy lately. She fell twice since the last visit a few months ago, in which she hit her head. No LOC.  A little abdominal pain, but no swelling.  Went over to the GI office today but could not get over there. Will try to reschedule.  Reports compliance with her iron and lasix/spironolactone.  PERTINENT  PMH / PSH: Alzheimers/Vasculaattpr dementia, Cirrhosis, T2DM,   OBJECTIVE:   BP 115/68   Pulse 100   Ht 5\' 4"  (1.626 m)   Wt 190 lb (86.2 kg)   SpO2 97%   BMI 32.61 kg/m      ASSESSMENT/PLAN:   No problem-specific Assessment & Plan notes found for this encounter.     Darral Dash, DO [facility]    {    This will disappear when note is signed, click to select method of visit    :1}

## 2023-02-05 NOTE — Patient Instructions (Addendum)
It was great seeing you today.  As we discussed, -We are checking blood work today.  I will call you if anything is abnormal. -Please continue taking your medications as prescribed. -If you have dizziness, falls in which you hit your head or have loss of consciousness please seek urgent care at the ER.  If you have a nosebleed that does not stop bleeding, rectal bleeding or you are coughing up or throwing up blood, please go to the ER as well.  Please call [facility] as soon as possible to reschedule your appointment.  This is very important.  The phone numbers listed below: [phone]   If you have any questions or concerns, please feel free to call the clinic.   Have a wonderful day,  Dr. Darral Dash [facility] [phone]

## 2023-02-06 LAB — CBC
Hematocrit: 38 % (ref 34.0–46.6)
Hemoglobin: 13 g/dL (ref 11.1–15.9)
MCH: 32.7 pg (ref 26.6–33.0)
MCHC: 34.2 g/dL (ref 31.5–35.7)
MCV: 96 fL (ref 79–97)
Platelets: 140 10*3/uL — ABNORMAL LOW (ref 150–450)
RBC: 3.97 x10E6/uL (ref 3.77–5.28)
RDW: 16 % — ABNORMAL HIGH (ref 11.7–15.4)
WBC: 5.6 10*3/uL (ref 3.4–10.8)

## 2023-02-06 LAB — COMPREHENSIVE METABOLIC PANEL
ALT: 30 IU/L (ref 0–32)
AST: 53 IU/L — ABNORMAL HIGH (ref 0–40)
Albumin: 3.7 g/dL — ABNORMAL LOW (ref 3.9–4.9)
Alkaline Phosphatase: 102 IU/L (ref 44–121)
BUN/Creatinine Ratio: 23 (ref 12–28)
BUN: 19 mg/dL (ref 8–27)
Bilirubin Total: 0.8 mg/dL (ref 0.0–1.2)
CO2: 25 mmol/L (ref 20–29)
Calcium: 9.7 mg/dL (ref 8.7–10.3)
Chloride: 99 mmol/L (ref 96–106)
Creatinine, Ser: 0.84 mg/dL (ref 0.57–1.00)
Globulin, Total: 3.5 g/dL (ref 1.5–4.5)
Glucose: 149 mg/dL — ABNORMAL HIGH (ref 70–99)
Potassium: 4.4 mmol/L (ref 3.5–5.2)
Sodium: 140 mmol/L (ref 134–144)
Total Protein: 7.2 g/dL (ref 6.0–8.5)
eGFR: 77 mL/min/{1.73_m2} (ref >59)

## 2023-02-06 LAB — PROTIME-INR
INR: 1.1 (ref 0.9–1.2)
Prothrombin Time: 11.9 s (ref 9.1–12.0)

## 2023-02-06 LAB — APTT: aPTT: 28 s (ref 24–33)

## 2023-02-09 DIAGNOSIS — K7460 Unspecified cirrhosis of liver: Secondary | ICD-10-CM | POA: Insufficient documentation

## 2023-02-09 NOTE — Assessment & Plan Note (Signed)
At baseline.  Alert and oriented x 3.  Now living at independent assisted facility, which is beneficial for her in terms of safety.

## 2023-02-09 NOTE — Assessment & Plan Note (Signed)
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  Hemoglobin is stable at 13.0, platelet count 140 which we can monitor. Encourage very close follow-up. -Stressed importance of calling the GI office to reschedule her appointment, provided her with the phone number. -Discussed return precautions, particularly ED return precautions should she have episodes of bleeding that do not resolve, develop fever, abdominal pain, etc.

## 2023-02-09 NOTE — Assessment & Plan Note (Signed)
Believe she is likely having episodes of vertigo with her reported dizziness. Can consider vestibular rehab in the future if persists. Encouraged to avoid meclizine as this anticholinergic and may contribute to falls

## 2023-02-10 NOTE — Progress Notes (Deleted)
02/10/2023 [name] [id number] [birth year]   CHIEF COMPLAINT: Cirrhosis   HISTORY OF PRESENT ILLNESS: [name] is a 67 year old female with a past medical history of arthritis, osteoporosis, hypertension, aortic stenosis, CVA, COPD, DM type II, kidney stones, dementia, chronic anemia, SBO and colon polyps. Past cholecystectomy.   She presents to our office today for cirrhosis follow up. 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 Admission. MELD 3.0: 13.   Initial workup showed a negative acute hepatitis panel, negative ANA, negative antimitochondrial antibody, mildly low ceruloplasmin (unlikely Wison's disease), normal alpha-1 antitrypsin, negative ASMA, Normal tTG and IgA. Normal iron TIBC and ferritin. Abdominal ultrasound upon arrival showed a cirrhotic liver and large amount of ascites. IR performed a paracentesis with removal of 2.3 L of clear yellow fluid. Ascites fluid studies thus far suggest liver etiology of her ascites with a low total albumin and low total protein. Ascites cell count was negative for SBP. Echocardiogram showed LVEF of 65-70%, with grade 1 diastolic dysfunction and aortic stenosis. Discharged home on Furosemide 40mg  every day and Spironolactone 100mg  every day.  MELD 3.0: 8 at 02/05/2023  3:53 PM MELD-Na: 7 at 02/05/2023  3:53 PM Calculated from: Serum Creatinine: 0.84 mg/dL (Using min of 1 mg/dL) at 7/82/9562  1:30 PM Serum Sodium: 140 mmol/L (Using max of 137 mmol/L) at 02/05/2023  3:53 PM Total Bilirubin: 0.8 mg/dL (Using min of 1 mg/dL) at 8/65/7846  9:62 PM Serum Albumin: 3.7 g/dL (Using max of 3.5 g/dL) at 9/52/8413  2:44 PM INR(ratio): 1.1 at 02/05/2023  3:53 PM Age at listing (hypothetical): 67 years Sex: Female at 02/05/2023  3:53 PM  3  RUQ sonogram 09/16/2022: Hepatic echogenicity increased consistent with fatty infiltration. No focal  hepatic lesions identified. No biliary ductal dilatation. Gallbladder has been removed. There is a large ascites observed in the right upper and lower quadrants and left lower quadrant. CBD 0.7 cm.   IMPRESSION: 1. Hepatic fatty infiltration 2. Cirrhotic appearance of the liver. 3. Large ascites.  ECHOCARDIOGRAM COMPLETE Result Date: 09/17/2022 IMPRESSIONS  1. Left ventricular ejection fraction, by estimation, is 65 to 70%. The left ventricle has normal function. The left ventricle has no regional wall motion abnormalities. Left ventricular diastolic parameters are consistent with Grade I diastolic dysfunction (impaired relaxation).  2. Right ventricular systolic function is normal. The right ventricular size is normal. There is mildly elevated pulmonary artery systolic pressure. The estimated right ventricular systolic pressure is 34.4 mmHg.  3. Left atrial size was mildly dilated.  4. Right atrial size was mildly dilated.  5. The mitral valve is normal in structure. No evidence of mitral valve regurgitation. No evidence of mitral stenosis.  6. The aortic valve is tricuspid. There is moderate calcification of the aortic valve. Aortic valve regurgitation is not visualized. Mild aortic valve stenosis. Aortic valve area, by VTI measures 1.51 cm. Aortic valve mean gradient measures 17.0 mmHg. Aortic valve Vmax measures 2.70 m/s.  7. The inferior vena cava is normal in size with greater than 50% respiratory variability, suggesting right atrial pressure of 3 mmHg  PAST GI PROCEDURES:  Colonoscopy 07/26/2019 by Dr. Adela Lank: - Preparation of the colon was inadequate for screening purposes.  - Hemorrhoids found on perianal exam. - Diverticulosis in the sigmoid colon.  - Restricted mobility of the left colon  - Three 2 to 5 mm polyps in the sigmoid colon, removed  with a cold snare. Resected and retrieved. Snare tip coagulation used at the largest polyp site for hemostasis to treat mild persistent  oozing. - Internal hemorrhoids.  - The examination was otherwise normal.  -Repeat colonoscopy in 3 months with a double prep TUBULAR ADENOMA (X3 FRAGMENTS). - NO HIGH GRADE DYSPLASIA OR MALIGNANCY.  Colonoscopy 03/14/2016: - One 4 mm polyp in the cecum, removed with a cold snare. Resected and retrieved.  - One 4 mm polyp in the ascending colon, removed with a cold biopsy forceps. Resected and retrieved.  - One 6 mm polyp in the sigmoid colon, removed with a cold snare. Resected and retrieved.  - Tortuous colon, restricted mobility of the left colon.  - Non-bleeding internal hemorrhoids.  - A single colonic angiodysplastic lesion.  - The examination was otherwise normal. - TUBULAR ADENOMAS. NO HIGH GRADE DYSPLASIA OR INVASIVE MALIGNANCY IDENTIFIED.    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   Osteoporosis    in lower back per pt    Partial small bowel obstruction (HCC) 01/30/2020   Plantar fasciitis    Snoring 08/28/2017   Stroke (HCC) 15 years ago    residual left sided weakness   Subacute maxillary sinusitis 05/19/2020   Tremors of nervous system    Uterovaginal prolapse, incomplete 06/10/2010   Qualifier: Diagnosis of  By: Humberto Seals NP, Darl Pikes     Vertigo    Past Surgical History:  Procedure Laterality Date   APPENDECTOMY     BLADDER SURGERY     x2   CATARACT EXTRACTION W/PHACO Left 05/06/2017   Procedure: CATARACT EXTRACTION PHACO AND INTRAOCULAR LENS PLACEMENT (IOC) LEFT DIABETIC;  Surgeon: Lockie Mola, MD;  Location: [facility];  Service: Ophthalmology;  Laterality: Left;   Diabetic - oral meds   CATARACT EXTRACTION W/PHACO Right 06/03/2017   Procedure: CATARACT EXTRACTION PHACO AND INTRAOCULAR LENS PLACEMENT (IOC) RIGHT DIABETIC;  Surgeon: Lockie Mola, MD;  Location: [facility];  Service: Ophthalmology;  Laterality: Right;  Diabetic - oral meds   CHOLECYSTECTOMY     COLONOSCOPY  03/14/2016   FOOT SURGERY     IR PARACENTESIS  09/17/2022   KYPHOPLASTY N/A 04/07/2019   Procedure: L4 KYPHOPLASTY;  Surgeon: Kennedy Bucker, MD;  Location: [facility];  Service: Orthopedics;  Laterality: N/A;   TOTAL ABDOMINAL HYSTERECTOMY     TOTAL KNEE ARTHROPLASTY Left 09/21/2018   Procedure: TOTAL KNEE ARTHROPLASTY-LEFT NEEDS RNFA;  Surgeon: Kennedy Bucker, MD;  Location: [facility];  Service: Orthopedics;  Laterality: Left;   Social History:  Family History:    reports that she has never smoked. She has never been exposed to tobacco smoke. She has never used smokeless tobacco. She reports that she does not drink alcohol and does not use drugs. family history includes Alcohol abuse in her brother and father; Breast cancer in her daughter; Diabetes in her sister; Healthy in her mother; Heart disease in her father and sister.  Allergies  Allergen Reactions   Pregabalin Nausea And Vomiting   Hydrocodone-Acetaminophen Nausea And Vomiting  Outpatient Encounter Medications as of 02/11/2023  Medication Sig   Accu-Chek FastClix Lancets MISC USE TO CHECK BLOOD SUGAR THREE TIMES DAILY   albuterol (PROVENTIL) (2.5 MG/3ML) 0.083% nebulizer solution Take 3 mLs (2.5 mg total) by nebulization every 4 (four) hours as needed for wheezing.   albuterol (VENTOLIN HFA) 108 (90 Base) MCG/ACT inhaler Inhale 1-2 puffs into the lungs every 6 (six) hours as needed for wheezing.   Alcohol Swabs (B-D SINGLE USE SWABS REGULAR) PADS USE  TO  CLEAN  SKIN  BEFORE CHECKING BLOOD SUGAR   atorvastatin (LIPITOR) 10 MG tablet Take 1 tablet (10 mg total) by mouth daily.   Blood Glucose Monitoring  Suppl (ACCU-CHEK GUIDE) w/Device KIT 1 Device by Does not apply route in the morning, at noon, and at bedtime.   Calcium Carbonate-Vitamin D (CALCIUM-D PO) Take 1 tablet by mouth daily.   Capsaicin 0.05 % CREA Apply to feet twice daily. (Patient taking differently: Apply 1 Application topically daily as needed (to feet).)   DULoxetine (CYMBALTA) 60 MG capsule Take 1 capsule (60 mg total) by mouth daily.   empagliflozin (JARDIANCE) 25 MG TABS tablet Take 1 tablet (25 mg total) by mouth daily.   ferrous gluconate (FERGON) 324 MG tablet Take 1 tablet (324 mg total) by mouth daily with breakfast.   fluticasone (FLONASE) 50 MCG/ACT nasal spray SPRAY 2 SPRAYS INTO EACH NOSTRIL EVERY DAY (Patient taking differently: Place 2 sprays into both nostrils daily.)   furosemide (LASIX) 40 MG tablet Take 1 tablet (40 mg total) by mouth daily. Get your blood work checked 2-3 times per year on this medication.   gabapentin (NEURONTIN) 800 MG tablet Take 1 tablet (800 mg total) by mouth 2 (two) times daily.   glucose blood (ACCU-CHEK GUIDE) test strip USE  TO CHECK BLOOD SUGAR THREE TIMES DAILY   glucose blood (ACCU-CHEK GUIDE) test strip 3 (three) times daily   ketoconazole (NIZORAL) 2 % shampoo APPLY TO AFFECTED AREA TOPICALLY 2 (TWO) TIMES A WEEK. (Patient taking differently: Apply 1 Application topically 2 (two) times a week.)   loratadine (CLARITIN) 10 MG tablet Take 10 mg by mouth daily.   meclizine (ANTIVERT) 12.5 MG tablet TAKE 1 TABLET TWICE DAILY AS NEEDED FOR DIZZINESS. (Patient taking differently: Take 12.5 mg by mouth every evening.)   metFORMIN (GLUCOPHAGE) 1000 MG tablet Take 1 tablet (1,000 mg total) by mouth 2 (two) times daily with a meal.   methocarbamol (ROBAXIN) 500 MG tablet Take 1 tablet (500 mg total) by mouth 2 (two) times daily as needed for muscle spasms.   Multiple Vitamins-Minerals ([employer] SPECTRAVITE WOMENS SENIOR PO) Take 1 tablet by mouth daily.    omeprazole (PRILOSEC) 20 MG capsule Take  1 capsule (20 mg total) by mouth every evening.   ondansetron (ZOFRAN) 4 MG tablet TAKE 1 TABLET BY MOUTH EVERY 8 HOURS AS NEEDED FOR NAUSEA AND VOMITING (Patient taking differently: Take 4 mg by mouth every 8 (eight) hours as needed for vomiting or nausea.)   oxyCODONE (OXY IR/ROXICODONE) 5 MG immediate release tablet Take 5 mg by mouth every 6 (six) hours as needed for moderate pain.   spironolactone (ALDACTONE) 100 MG tablet Take 1 tablet (100 mg total) by mouth daily.   Wound Dressings (MEDIHONEY CA ALGINATE 2"X2") PADS    No facility-administered encounter medications on file as of 02/11/2023.   REVIEW OF SYSTEMS:  Gen: Denies fever, sweats or chills. No weight loss.  CV: Denies chest pain, palpitations or edema. Resp: Denies cough,  shortness of breath of hemoptysis.  GI: Denies heartburn, dysphagia, stomach or lower abdominal pain. No diarrhea or constipation.  GU : Denies urinary burning, blood in urine, increased urinary frequency or incontinence. MS: Denies joint pain, muscles aches or weakness. Derm: Denies rash, itchiness, skin lesions or unhealing ulcers. Psych: Denies depression, anxiety, memory loss or confusion. Heme: Denies bruising, easy bleeding. Neuro:  Denies headaches, dizziness or paresthesias. Endo:  Denies any problems with DM, thyroid or adrenal function.  PHYSICAL EXAM: There were no vitals taken for this visit. General: in no acute distress. Head: Normocephalic and atraumatic. Eyes:  Sclerae non-icteric, conjunctive pink. Ears: Normal auditory acuity. Mouth: Dentition intact. No ulcers or lesions.  Neck: Supple, no lymphadenopathy or thyromegaly.  Lungs: Clear bilaterally to auscultation without wheezes, crackles or rhonchi. Heart: Regular rate and rhythm. No murmur, rub or gallop appreciated.  Abdomen: Soft, nontender, nondistended. No masses. No hepatosplenomegaly. Normoactive bowel sounds x 4 quadrants.  Rectal: Deferred.  Musculoskeletal: Symmetrical  with no gross deformities. Skin: Warm and dry. No rash or lesions on visible extremities. Extremities: No edema. Neurological: Alert oriented x 4, no focal deficits.  Psychological:  Alert and cooperative. Normal mood and affect.  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 MELD 3.0: 8 per labs 01/26/2023. AFP 5.1 on 09/17/2022.  -IgG -Surveillance EGD  -Surveillance abdominal sonogram due 02/2023  History of colon polyps. Colonoscopy 07/26/2019    CC:  Valetta Close, MD

## 2023-02-11 ENCOUNTER — Ambulatory Visit: Payer: 59 | Admitting: Nurse Practitioner

## 2023-02-16 ENCOUNTER — Encounter: Payer: Self-pay | Admitting: Gastroenterology

## 2023-04-10 ENCOUNTER — Encounter: Payer: Self-pay | Admitting: Student

## 2023-04-10 ENCOUNTER — Ambulatory Visit (INDEPENDENT_AMBULATORY_CARE_PROVIDER_SITE_OTHER): Payer: 59 | Admitting: Student

## 2023-04-10 VITALS — BP 130/83 | HR 88 | Temp 97.9°F | Ht 64.0 in | Wt 190.5 lb

## 2023-04-10 DIAGNOSIS — Z23 Encounter for immunization: Secondary | ICD-10-CM | POA: Diagnosis not present

## 2023-04-10 DIAGNOSIS — K7469 Other cirrhosis of liver: Secondary | ICD-10-CM

## 2023-04-10 DIAGNOSIS — E1142 Type 2 diabetes mellitus with diabetic polyneuropathy: Secondary | ICD-10-CM | POA: Diagnosis not present

## 2023-04-10 DIAGNOSIS — G894 Chronic pain syndrome: Secondary | ICD-10-CM | POA: Diagnosis not present

## 2023-04-10 LAB — POCT GLYCOSYLATED HEMOGLOBIN (HGB A1C): HbA1c, POC (controlled diabetic range): 6.6 % (ref 0.0–7.0)

## 2023-04-10 MED ORDER — OXYCODONE HCL 5 MG PO TABS
5.0000 mg | ORAL_TABLET | Freq: Four times a day (QID) | ORAL | 0 refills | Status: DC | PRN
Start: 2023-04-10 — End: 2023-10-13

## 2023-04-10 NOTE — Patient Instructions (Signed)
It was great to see you! Thank you for allowing me to participate in your care!   I recommend that you always bring your medications to each appointment as this makes it easy to ensure we are on the correct medications and helps [place] not miss when refills are needed.  Our plans for today:  - Lidocaine gel for leg pain - I will resume your prescription for oxycodone, for all refills I will take over. Please do not request refills from other providers. - Continue taking your other medications as prescribed - Follow-up in 3 months  We are checking some labs today, I will call you if they are abnormal will send you a MyChart message or a letter if they are normal.  If you do not hear about your labs in the next 2 weeks please let us know.  Take care and seek immediate care sooner if you develop any concerns. Please remember to show up 15 minutes before your scheduled appointment time!  Tiffany Kocher, DO [facility]

## 2023-04-10 NOTE — Progress Notes (Signed)
    SUBJECTIVE:   CHIEF COMPLAINT / HPI:   Pain management leg swelling Patient presents to discuss refilling of oxycodone.  She was previously being seen at pain clinic in [place].  Last prescribed oxycodone in March of 2024. She takes it as needed. Additionally, she takes gabapentin.  She states that is difficult to get to [facility] due to transportation issues, [place] more convenient.  She is requesting to transfer pain management care to PCP, understands that if she does so she would not be able to go to pain clinic for medication.  Additionally she has chronic leg swelling.  Recently swelling was worse however today swelling is now better.  She is compliant with Lasix and spironolactone.  OBJECTIVE:   BP 130/83   Pulse 88   Temp 97.9 F (36.6 C) (Oral)   Ht 5\' 4"  (1.626 m)   Wt 190 lb 8 oz (86.4 kg)   SpO2 97%   BMI 32.70 kg/m    General: NAD, pleasant, chronically ill-appearing Cardio: RRR, no MRG. Mild non-pitting BL lower extremity edema. Respiratory: CTAB, normal wob on RA GI: Abdomen is soft, not tender. Mild distension. BS present Skin: Warm and dry. No jaundice  ASSESSMENT/PLAN:   Assessment & Plan Chronic pain syndrome Patient seen by clinic in [place], but has transportation issues. Requesting change to PCP for pain management.  Reviewed PDMP.  Will refill Oxycodone, pain agreement scanned to chart. Additionally has painful neuropathy in legs, despite gabapentin and capsaicin cream. -Oxycodone 5 mg 6hr PRN -OTC lidocaine gel -F/u 3 months Decompensated liver disease (HCC) Mild non-pitting leg edema. No recent epistaxis or bleeding. Compliant with medications. No volume overloaded on exam. Follow-up with GI on 10/18.  -Continue Lasix and Spironolactone -Will order U/S and monitoring labs if not performed by GI at her next appointment. Type 2 diabetes mellitus with diabetic polyneuropathy, without long-term current use of insulin (HCC) A1c of 6.6, elevated but  still within goal. Continue metformin.  Encounter for immunization Flu and covid shot today.   Tiffany Kocher, DO [facility]

## 2023-04-10 NOTE — Assessment & Plan Note (Signed)
Mild non-pitting leg edema. No recent epistaxis or bleeding. Compliant with medications. No volume overloaded on exam. Follow-up with GI on 10/18.  -Continue Lasix and Spironolactone -Will order U/S and monitoring labs if not performed by GI at her next appointment.

## 2023-04-10 NOTE — Assessment & Plan Note (Signed)
Patient seen by clinic in [place], but has transportation issues. Requesting change to PCP for pain management.  Reviewed PDMP.  Will refill Oxycodone, pain agreement scanned to chart. Additionally has painful neuropathy in legs, despite gabapentin and capsaicin cream. -Oxycodone 5 mg 6hr PRN -OTC lidocaine gel -F/u 3 months

## 2023-04-10 NOTE — Assessment & Plan Note (Signed)
A1c of 6.6, elevated but still within goal. Continue metformin.

## 2023-04-13 ENCOUNTER — Telehealth: Payer: Self-pay

## 2023-04-13 NOTE — Telephone Encounter (Signed)
Pharmacy Patient Advocate Encounter   Received notification from CoverMyMeds that prior authorization for Oxycodone is required/requested.   Insurance verification completed.   The patient is insured through [facility] .   Per test claim: PA required; PA submitted to [facility] via CoverMyMeds Key/confirmation #/EOC [id number]. Status is pending.

## 2023-04-13 NOTE — Telephone Encounter (Signed)
Pharmacy Patient Advocate Encounter  Received notification from [facility] that Prior Authorization for OXYCODONE has been APPROVED from 04/13/23 to 05/13/23  (PART D)  PA #/Case ID/Reference #:  [id number]

## 2023-05-19 ENCOUNTER — Telehealth: Payer: Self-pay | Admitting: Student

## 2023-05-19 NOTE — Telephone Encounter (Signed)
Called patient, confirmed identity. Offered TOP-C appointment this Thursday 10/31. Patient agrees to appointment, requesting 1:30 pm time slot.

## 2023-05-21 ENCOUNTER — Encounter: Payer: Self-pay | Admitting: Student

## 2023-05-21 ENCOUNTER — Ambulatory Visit (INDEPENDENT_AMBULATORY_CARE_PROVIDER_SITE_OTHER): Payer: 59 | Admitting: Student

## 2023-05-21 ENCOUNTER — Ambulatory Visit
Admission: RE | Admit: 2023-05-21 | Discharge: 2023-05-21 | Disposition: A | Discharge status: Home / Self Care | Payer: 59 | Source: Ambulatory Visit | Attending: Family Medicine | Admitting: Family Medicine

## 2023-05-21 VITALS — BP 118/62 | HR 97 | Ht 64.0 in | Wt 200.4 lb

## 2023-05-21 DIAGNOSIS — M545 Low back pain, unspecified: Secondary | ICD-10-CM | POA: Diagnosis not present

## 2023-05-21 DIAGNOSIS — K7460 Unspecified cirrhosis of liver: Secondary | ICD-10-CM

## 2023-05-21 DIAGNOSIS — W19XXXA Unspecified fall, initial encounter: Secondary | ICD-10-CM | POA: Diagnosis not present

## 2023-05-21 DIAGNOSIS — E1140 Type 2 diabetes mellitus with diabetic neuropathy, unspecified: Secondary | ICD-10-CM

## 2023-05-21 DIAGNOSIS — Z7984 Long term (current) use of oral hypoglycemic drugs: Secondary | ICD-10-CM | POA: Diagnosis not present

## 2023-05-21 DIAGNOSIS — Z0000 Encounter for general adult medical examination without abnormal findings: Secondary | ICD-10-CM

## 2023-05-21 DIAGNOSIS — G8929 Other chronic pain: Secondary | ICD-10-CM

## 2023-05-21 NOTE — Assessment & Plan Note (Signed)
Currently well-controlled (A1c 6.6 last month) with metformin and Jardiance.  Patient expressed interest in GLP-1.  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 - Continue metformin 1000 mg twice daily and Jardiance 10 mg daily

## 2023-05-21 NOTE — Progress Notes (Signed)
SUBJECTIVE:   CHIEF COMPLAINT / HPI:   Fall 2 weeks ago patient suffered a fall in the bathroom.  Unfortunately she lives alone, and was in the bathtub for 2 days before being found.  She has a neighbor who will periodically check on her, and when she noticed that she was not able to reach her had called EMS.  EMS arrived and removed patient from tub, she declined saying medical care at that time.  She denies loss of consciousness, bleeding, and did not initially have pain.  However for the past couple weeks pain started to increase in her neck, she has decreased neck extension that is problematic for her.  She is not currently on blood thinners, and denies taking aspirin as she has not been taking this since her prior GI bleed.  She now has life alert on her person and her bathtub.  Decompensated liver cirrhosis Most recent MELD score 3.0 in February during her admission.  She has appointment with GI scheduled next month, they would like to do EGD to screen for varices.  Additionally, she needs to be established with GI/hepatology for her cirrhosis.  Patient had questions on overall course of liver cirrhosis, and we discussed this.   Pain management  neuropathy  back pain Patient reports that she has been with out her oxycodone for over a month, was unable to be filled up pharmacy.  She states she has been managing without it, and is okay with not continuing this.  Lidocaine patch is helping back pain.  She is not interested in physical therapy at this time.  Health maintenance -Full medication reconciliation completed today. -Discussed she may be a candidate for home health given her difficulty transporting and chronic medical diseases, however she was able to drive herself's appointment which may preclude her from services. -Patient lives alone, but does have some family nearby.  Unclear as to what facility she currently lives in, but is an apartment that also has nursing-but she has not  received nursing services.   OBJECTIVE:   BP 118/62   Pulse 97   Ht 5\' 4"  (1.626 m)   Wt 200 lb 6.4 oz (90.9 kg)   SpO2 98%   BMI 34.40 kg/m    General: NAD, pleasant HEENT: Normocephalic, atraumatic head. Normal external ear bilaterally. EOM intact and normal conjunctiva BL. Normal external nose. Throat not erythematous, no exudate, no deviation.  Cardio: RRR, no MRG. + 1 pitting edema BLE Respiratory: CTAB, normal wob on RA GI: Abdomen is soft, not tender, not distended, + fluid wave. BS present Neck: No gross deformity, no ecchymosis, no swelling.  TTP over spinous process of C3/4.  Limited extension, all other ROM normal.  5/5 strength and normal sensation. Skin: Warm and dry Neuro: CN II: PERRL CN III, IV,VI: EOMI CV V: Normal sensation in V1, V2, V3 CVII: Symmetric smile and brow raise CN VIII: Normal hearing CN IX,X: Symmetric palate raise  CN XI: 5/5 shoulder shrug CN XII: Symmetric tongue protrusion  UE and LE strength 5/5 2+ LE reflexes  Normal sensation in UE and LE bilaterally  No ataxia with finger to nose, normal heel to shin    ASSESSMENT/PLAN:   Assessment & Plan Cirrhosis of liver without ascites, unspecified hepatic cirrhosis type (HCC) MELD score 3.0, 09/17/2022.  Has appointment with GI next month.  Weight gain of 10 pounds since prior visit, noticeable abdominal fluid and +1 pitting edema bilaterally.  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 - 80 mg furosemide with 200 mg spironolactone every other day, for 7 days to treat edema - CMP today - Follow-up with GI - Recommend liver ultrasound, will order if not ordered by GI next month - May benefit from home health aide to assist with medication and vital checks Fall, initial encounter Neck pain, normal neuroexam.  Low suspicion for fracture on exam, but given mechanism of injury suggest evaluation with imaging. - Cervical x-ray Type 2 diabetes mellitus with diabetic  neuropathy, without long-term current use of insulin (HCC) Currently well-controlled (A1c 6.6 last month) with metformin and Jardiance.  Patient expressed interest in GLP-1.  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 - Continue metformin 1000 mg twice daily and Jardiance 10 mg daily Chronic bilateral low back pain without sciatica Patient has been able to manage her pain without opioid medications.  Discussed discontinuing opioid medication at this time.  Continue gabapentin 800 mg twice daily, Robaxin as needed, Tylenol as needed (recommend exceeding 2 g). - Consider formal PT if patient changes mind  Healthcare maintenance -Due for repeat colonoscopy - Due for Medicare annual wellness - Recommend UACR at next visit   Tiffany Kocher, DO [facility]

## 2023-05-21 NOTE — Assessment & Plan Note (Signed)
MELD score 3.0, 09/17/2022.  Has appointment with GI next month.  Weight gain of 10 pounds since prior visit, noticeable abdominal fluid and +1 pitting edema bilaterally.  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 - 80 mg furosemide with 200 mg spironolactone every other day, for 7 days to treat edema - CMP today - Follow-up with GI - Recommend liver ultrasound, will order if not ordered by GI next month - May benefit from home health aide to assist with medication and vital checks

## 2023-05-21 NOTE — Patient Instructions (Addendum)
It was great to see you! Thank you for allowing me to participate in your care!   I recommend that you always bring your medications to each appointment as this makes it easy to ensure we are on the correct medications and helps [place] not miss when refills are needed.  Our plans for today:  - Take 2 tablets of Furosemide, every other day, for 1 week.  - Take 2 tablets of spironolactone on the days you take 2 tablets of furosemide - Cut back on Tylenol to only twice daily - Please see GI doctor next month - I will start a referral for home health - An x-ray was ordered for you---you do not need an appointment to have this completed.  I recommend going to [facility] [address] [place]   We are checking some labs today, I will call you if they are abnormal will send you a MyChart message or a letter if they are normal.  If you do not hear about your labs in the next 2 weeks please let us know.  Take care and seek immediate care sooner if you develop any concerns. Please remember to show up 15 minutes before your scheduled appointment time!  Tiffany Kocher, DO [facility]

## 2023-05-22 LAB — COMPREHENSIVE METABOLIC PANEL
ALT: 18 [IU]/L (ref 0–32)
AST: 26 [IU]/L (ref 0–40)
Albumin: 3.6 g/dL — ABNORMAL LOW (ref 3.9–4.9)
Alkaline Phosphatase: 88 [IU]/L (ref 44–121)
BUN/Creatinine Ratio: 30 — ABNORMAL HIGH (ref 12–28)
BUN: 25 mg/dL (ref 8–27)
Bilirubin Total: 0.8 mg/dL (ref 0.0–1.2)
CO2: 26 mmol/L (ref 20–29)
Calcium: 9.1 mg/dL (ref 8.7–10.3)
Chloride: 102 mmol/L (ref 96–106)
Creatinine, Ser: 0.84 mg/dL (ref 0.57–1.00)
Globulin, Total: 3.3 g/dL (ref 1.5–4.5)
Glucose: 123 mg/dL — ABNORMAL HIGH (ref 70–99)
Potassium: 4.4 mmol/L (ref 3.5–5.2)
Sodium: 140 mmol/L (ref 134–144)
Total Protein: 6.9 g/dL (ref 6.0–8.5)
eGFR: 77 mL/min/{1.73_m2} (ref >59)

## 2023-06-08 ENCOUNTER — Encounter: Payer: Self-pay | Admitting: Gastroenterology

## 2023-06-08 ENCOUNTER — Ambulatory Visit (INDEPENDENT_AMBULATORY_CARE_PROVIDER_SITE_OTHER): Payer: 59 | Admitting: Gastroenterology

## 2023-06-08 ENCOUNTER — Other Ambulatory Visit: Payer: 59

## 2023-06-08 VITALS — BP 102/70 | HR 86 | Ht 64.0 in | Wt 194.0 lb

## 2023-06-08 DIAGNOSIS — R1031 Right lower quadrant pain: Secondary | ICD-10-CM

## 2023-06-08 DIAGNOSIS — Z860100 Personal history of colon polyps, unspecified: Secondary | ICD-10-CM

## 2023-06-08 DIAGNOSIS — R188 Other ascites: Secondary | ICD-10-CM

## 2023-06-08 DIAGNOSIS — K7460 Unspecified cirrhosis of liver: Secondary | ICD-10-CM

## 2023-06-08 DIAGNOSIS — K5909 Other constipation: Secondary | ICD-10-CM | POA: Diagnosis not present

## 2023-06-08 MED ORDER — NA SULFATE-K SULFATE-MG SULF 17.5-3.13-1.6 GM/177ML PO SOLN: 1.0000 | Freq: Once | ORAL | 0 refills | Status: AC

## 2023-06-08 NOTE — Patient Instructions (Signed)
You will be contacted by [facility] Scheduling in the next 2 days to arrange a Ultrasound.  The number on your caller ID will be [phone], please answer when they call.  If you have not heard from them in 2 days please call [phone] to schedule.      You have been scheduled for an abdominal ultrasound at [facility] (1st floor of hospital) on ______________ at ________________. Please arrive 30 minutes prior to your appointment for registration. Make certain not to have anything to eat or drink 6 hours prior to your appointment. Should you need to reschedule your appointment, please contact radiology at [phone]. This test typically takes about 30 minutes to perform.   Please go to the lab in the basement of our building to have lab work done as you leave today. Hit "B" for basement when you get on the elevator.  When the doors open the lab is on your left.  We will call you with the results. Thank you.   You have been scheduled for an endoscopy and colonoscopy. Please follow the written instructions given to you at your visit today.  Please pick up your prep supplies at the pharmacy within the next 1-3 days.  If you use inhalers (even only as needed), please bring them with you on the day of your procedure.  DO NOT TAKE 7 DAYS PRIOR TO TEST- Trulicity (dulaglutide) Ozempic, Wegovy (semaglutide) Mounjaro (tirzepatide) Bydureon Bcise (exanatide extended release)  DO NOT TAKE 1 DAY PRIOR TO YOUR TEST Rybelsus (semaglutide) Adlyxin (lixisenatide) Victoza (liraglutide) Byetta (exanatide) ___________________________________________________________________________  Please purchase the following medications over the counter and take as directed: Miralax: Take once daily and titrate as needed  We are giving you a Low Sodium Diet to follow.  Please follow up in 6 months.  Thank you for entrusting me with your care and for choosing [facility], Dr.  Ileene Patrick

## 2023-06-08 NOTE — Progress Notes (Signed)
HPI :  67 year old female with a history of colon polyps, constipation, cirrhosis, diabetes, here to reestablish her GI care.  I last saw her in January 2021 for colonoscopy.  She reports her bowels have been moving okay when she takes a stool softener every night.  Although she has had some worsening constipation at times.  She is having a bowel movement on a daily basis but reports passing hard stool and difficult to evacuate.  No blood in her stool.  She had a colonoscopy in 2017 and multiple small polyps removed, we repeated a follow-up colonoscopy in January 2021.  She had a few polyps again removed all of her prep was poor and recommended a repeat exam with a double prep.  Unfortunately she was never able to follow-up for that and has not had it done yet.  No family history of colon cancer.  Otherwise in looking through her chart it appears she was diagnosed with cirrhosis within the past year or so.  It looks as though she has had cirrhosis on imaging back in 2021, she presented to the hospital with new onset ascites this past February.  INR was mildly elevated at 1.4.  Albumin was 2.1 at the time.  She had a serologic workup which was negative other than a mildly low ceruloplasmin which is not repeated.  She does not drink any alcohol on a routine basis.  She was thought to have cirrhosis related to fatty liver disease.  She was started on diuretics, Lasix and Aldactone.  EGD was recommended for variceal screening at some point but she has not had it done yet.  I have not seen her since that hospitalization.  She denies much lower extremity edema.  She is not sure if she is having ascites or not, she has a protuberant lower abdomen.  She has had some right lower quadrant pain intermittently for the past month or so.  She is not sure if it is related to her back pain.  The more active she is the worst pain is that she has lower back pain that bothers her most of the time.  Her discomfort is usually  constant in her abdomen.  Lying down flat at night makes her feel better.  She denies any prandial worsening.  Sometimes if she moves her bowels may help a little bit?  She is not taking any narcotics.  She is on Cymbalta, Robaxin, and Neurontin for pain.  She is taking Lasix 40 mg daily and Aldactone 100 mg daily, although doubling the dose every other day.  Again she denies any lower extremity swelling on the regimen.  She had a dose increase to this dosing range 2 weeks ago but has not had follow-up labs done yet.  Prior workup: Colonoscopy 07/26/19: - Hemorrhoids were found on perianal exam. - A few medium-mouthed diverticula were found in the sigmoid colon. - A moderate amount of solid stool was found in the entire colon, interfering with visualization. Prep was worst in the right colon, could not clear the cecum and right colon. - Three sessile polyps were found in the sigmoid colon. The polyps were 2 to 5 mm in size. These polyps were removed with a cold snare. There was persistent oozing at the largest polypectomy site after several minutes of observation. Snare tip coagulation was performed to the area with good hemostasis. Resection and retrieval were complete. - Internal hemorrhoids were found during retroflexion. - The sigmoid colon had a very restricted lumen,  which took some time to traverse, using mostly water immersion. The exam was otherwise without abnormality.  Surgical [P], colon, sigmoid, polyp - TUBULAR ADENOMA (X3 FRAGMENTS). - NO HIGH GRADE DYSPLASIA OR MALIGNANCY.  Recommended a repeat exam using double prep   Colonoscopy 03/14/2016: - The perianal and digital rectal examinations were normal. - A 4 mm polyp was found in the cecum. The polyp was sessile. The polyp was removed with a cold snare. Resection and retrieval were complete. - A 4 mm polyp was found in the ascending colon. The polyp was flat. The polyp was removed with a cold biopsy forceps. Resection and retrieval were  complete. - A 6 mm polyp was found in the sigmoid colon. The polyp was sessile. The polyp was removed with a cold snare. Resection and retrieval were complete. - The colon was tortuous with restricted mobility of the left colon, which led to a prolonged cecal intubation. - Non-bleeding internal hemorrhoids were found during retroflexion. - A single small angiodysplastic lesion was found in the descending colon. - The exam was otherwise without abnormality.  Surgical [P], sigmoid, cecum, ascending, polyp (3) - TUBULAR ADENOMAS. NO HIGH GRADE DYSPLASIA OR INVASIVE MALIGNANCY IDENTIFIED.    RUQ [place] 09/16/22: IMPRESSION: 1. Hepatic fatty infiltration 2. Cirrhotic appearance of the liver. 3. Large ascites.   CT scan abdomen / pelvis 01/30/20: IMPRESSION: 1. Diffusely dilated and fluid-filled small bowel loops with fecalization of small bowel contents. There is no transition point, however there is mild mesenteric edema and trace free fluid in the pelvis. Differential considerations include small bowel ileus versus early obstruction 2. Diffusely decreased hepatic density with suggestion of nodular contours about the anterior left lobe, raising concern for cirrhosis. Possible tiny micro nodules. Recommend correlation with cirrhosis risk factors. Consider further evaluation with hepatic protocol MRI for more detailed hepatic parenchyma characterization. Borderline splenomegaly. 3. Small hiatal hernia. 4. 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   Osteoporosis    in lower back per pt    Partial small bowel obstruction (HCC) 01/30/2020   Plantar fasciitis    Snoring 08/28/2017   Stroke (HCC) 15 years ago    residual left sided weakness   Subacute maxillary sinusitis 05/19/2020   Tremors of nervous system    Uterovaginal prolapse, incomplete 06/10/2010   Qualifier: Diagnosis of  By: Humberto Seals NP, Darl Pikes     Vertigo      Past Surgical History:  Procedure Laterality Date   APPENDECTOMY     BLADDER SURGERY     x2   CATARACT EXTRACTION W/PHACO Left 05/06/2017   Procedure: CATARACT EXTRACTION PHACO AND INTRAOCULAR LENS PLACEMENT (IOC) LEFT DIABETIC;  Surgeon: Lockie Mola, MD;  Location: [facility];  Service: Ophthalmology;  Laterality: Left;  Diabetic - oral meds   CATARACT EXTRACTION W/PHACO Right 06/03/2017   Procedure: CATARACT EXTRACTION PHACO AND INTRAOCULAR LENS PLACEMENT (IOC) RIGHT DIABETIC;  Surgeon: Lockie Mola,  MD;  Location: [facility];  Service: Ophthalmology;  Laterality: Right;  Diabetic - oral meds   CHOLECYSTECTOMY     COLONOSCOPY  03/14/2016   FOOT SURGERY     IR PARACENTESIS  09/17/2022   KYPHOPLASTY N/A 04/07/2019   Procedure: L4 KYPHOPLASTY;  Surgeon: Kennedy Bucker, MD;  Location: [facility];  Service: Orthopedics;  Laterality: N/A;   TOTAL ABDOMINAL HYSTERECTOMY     TOTAL KNEE ARTHROPLASTY Left 09/21/2018   Procedure: TOTAL KNEE ARTHROPLASTY-LEFT NEEDS RNFA;  Surgeon: Kennedy Bucker, MD;  Location: [facility];  Service: Orthopedics;  Laterality: Left;   Family History  Problem Relation Age of Onset   Healthy Mother    Heart disease Father    Alcohol abuse Father    Alcohol abuse Brother    Heart disease Sister    Diabetes Sister    Breast cancer Daughter    Colon polyps Neg Hx    Colon cancer Neg Hx    Esophageal cancer Neg Hx    Rectal cancer Neg Hx    Stomach cancer Neg Hx    Social History   Tobacco Use   Smoking  status: Never    Passive exposure: Never   Smokeless tobacco: Never  Vaping Use   Vaping status: Never Used  Substance Use Topics   Alcohol use: No   Drug use: No   Current Outpatient Medications  Medication Sig Dispense Refill   albuterol (PROVENTIL) (2.5 MG/3ML) 0.083% nebulizer solution Take 3 mLs (2.5 mg total) by nebulization every 4 (four) hours as needed for wheezing. 75 mL 2   Alcohol Swabs (B-D SINGLE USE SWABS REGULAR) PADS USE  TO  CLEAN  SKIN  BEFORE CHECKING BLOOD SUGAR 300 each 11   atorvastatin (LIPITOR) 10 MG tablet Take 1 tablet (10 mg total) by mouth daily. 90 tablet 3   Calcium Carbonate-Vitamin D (CALCIUM-D PO) Take 1 tablet by mouth daily.     DULoxetine (CYMBALTA) 60 MG capsule Take 1 capsule (60 mg total) by mouth daily. 90 capsule 3   empagliflozin (JARDIANCE) 25 MG TABS tablet Take 1 tablet (25 mg total) by mouth daily. 90 tablet 3   ferrous gluconate (FERGON) 324 MG tablet Take 1 tablet (324 mg total) by mouth daily with breakfast. 90 tablet 3   furosemide (LASIX) 40 MG tablet Take 1 tablet (40 mg total) by mouth daily. Get your blood work checked 2-3 times per year on this medication. 90 tablet 1   gabapentin (NEURONTIN) 800 MG tablet Take 1 tablet (800 mg total) by mouth 2 (two) times daily. 180 tablet 3   glucose blood (ACCU-CHEK GUIDE) test strip USE  TO CHECK BLOOD SUGAR THREE TIMES DAILY 300 each 12   glucose blood (ACCU-CHEK GUIDE) test strip 3 (three) times daily     metFORMIN (GLUCOPHAGE) 1000 MG tablet Take 1 tablet (1,000 mg total) by mouth 2 (two) times daily with a meal. 180 tablet 3   methocarbamol (ROBAXIN) 500 MG tablet Take 1 tablet (500 mg total) by mouth 2 (two) times daily as needed for muscle spasms. 60 tablet 2   Multiple Vitamins-Minerals ([employer] SPECTRAVITE WOMENS SENIOR PO) Take 1 tablet by mouth daily.      omeprazole (PRILOSEC) 20 MG capsule Take 1 capsule (20 mg total) by mouth every evening. 90 capsule 1   OVER THE COUNTER MEDICATION Stool  softener     spironolactone (ALDACTONE) 100 MG tablet Take 1 tablet (100 mg total) by mouth daily. 90 tablet 3  Accu-Chek FastClix Lancets MISC USE TO CHECK BLOOD SUGAR THREE TIMES DAILY (Patient not taking: Reported on 06/08/2023) 300 each 11   albuterol (VENTOLIN HFA) 108 (90 Base) MCG/ACT inhaler Inhale 1-2 puffs into the lungs every 6 (six) hours as needed for wheezing. (Patient not taking: Reported on 06/08/2023) 2 each 2   Blood Glucose Monitoring Suppl (ACCU-CHEK GUIDE) w/Device KIT 1 Device by Does not apply route in the morning, at noon, and at bedtime. (Patient not taking: Reported on 06/08/2023) 1 kit 2   Capsaicin 0.05 % CREA Apply to feet twice daily. (Patient not taking: Reported on 06/08/2023) 57 g 2   ondansetron (ZOFRAN) 4 MG tablet TAKE 1 TABLET BY MOUTH EVERY 8 HOURS AS NEEDED FOR NAUSEA AND VOMITING (Patient not taking: Reported on 05/21/2023) 30 tablet 1   oxyCODONE (OXY IR/ROXICODONE) 5 MG immediate release tablet Take 1 tablet (5 mg total) by mouth every 6 (six) hours as needed for up to 60 doses for moderate pain. (Patient not taking: Reported on 05/21/2023) 60 tablet 0   No current facility-administered medications for this visit.   Allergies  Allergen Reactions   Pregabalin Nausea And Vomiting   Hydrocodone-Acetaminophen Nausea And Vomiting     Review of Systems: All systems reviewed and negative except where noted in HPI.   MELD 3.0: 8 at 02/05/2023  3:53 PM MELD-Na: 7 at 02/05/2023  3:53 PM Calculated from: Serum Creatinine: 0.84 mg/dL (Using min of 1 mg/dL) at 9/56/3875  6:43 PM Serum Sodium: 140 mmol/L (Using max of 137 mmol/L) at 02/05/2023  3:53 PM Total Bilirubin: 0.8 mg/dL (Using min of 1 mg/dL) at 10/17/5186  4:16 PM Serum Albumin: 3.7 g/dL (Using max of 3.5 g/dL) at 12/25/3014  0:10 PM INR(ratio): 1.1 at 02/05/2023  3:53 PM Age at listing (hypothetical): 67 years Sex: Female at 02/05/2023  3:53 PM  Lab Results  Component Value Date   INR 1.1 02/05/2023    INR 1.4 (H) 09/16/2022   INR 1.4 (H) 08/28/2022    Lab Results  Component Value Date   NA 140 05/21/2023   CL 102 05/21/2023   K 4.4 05/21/2023   CO2 26 05/21/2023   BUN 25 05/21/2023   CREATININE 0.84 05/21/2023   EGFR 77 05/21/2023   CALCIUM 9.1 05/21/2023   ALBUMIN 3.6 (L) 05/21/2023   GLUCOSE 123 (H) 05/21/2023    Lab Results  Component Value Date   ALT 18 05/21/2023   AST 26 05/21/2023   ALKPHOS 88 05/21/2023   BILITOT 0.8 05/21/2023     Physical Exam: BP 102/70   Pulse 86   Ht 5\' 4"  (1.626 m)   Wt 194 lb (88 kg)   BMI 33.30 kg/m  Constitutional: Pleasant, female in no acute distress. HEENT: Normocephalic and atraumatic. Conjunctivae are normal. No scleral icterus. Cardiovascular: Normal rate, regular rhythm. 2/6 SEM Pulmonary/chest: Effort normal and breath sounds normal.  Abdominal: Soft, nondistended, nontender. Protuberant. There are no masses palpable. No hepatomegaly. Extremities: no edema Neurological: Alert and oriented to person place and time. Psychiatric: Normal mood and affect. Behavior is normal.   ASSESSMENT: 67 y.o. female here for assessment of the following  1. RLQ abdominal pain   2. Cirrhosis of liver with ascites, unspecified hepatic cirrhosis type (HCC)   3. Chronic constipation   4. History of colon polyps    History as above, some right lower abdominal discomfort that seems that it may be most likely musculoskeletal given positional component and description of it.  She  is having some constipation and question if that could be related.  She is due for colonoscopy and abdominal imaging of her liver to exclude other causes.  If symptoms persist and these next few exams are negative we will consider CT imaging.  We discussed her diagnosis of cirrhosis.  I explained to her what cirrhosis is, risks for decompensation over time and HCC.  She has had some ascites in the past but she certainly does not have tense ascites now and question if  she even has it at all, hard to say given her abdominal exam.  She is due for [facility] screening, recommend complete abdominal ultrasound to screen her liver and assess for ascites.  She is due for BMET to follow-up her kidney function after increasing her Lasix Aldactone dosing, will make recommendations on those medications pending her lab results.  She is not on a low-sodium diet and recommended that to her and handouts provided for education.  She is due for an EGD for varices screening.  We discussed what this is, risks and benefits of that and anesthesia and she wants to proceed.  I will otherwise repeat a ceruloplasmin to make sure okay, I do agree that her cirrhosis is likely due to [facility] given her workup to date.  Otherwise she is having some constipation, discussed options, recommend MiraLAX daily and titrate up as needed to produce soft bowels and minimize straining.  She is due for surveillance colonoscopy, last prep was inadequate and she had multiple polyps removed.  Recommend double prep for her next exam to ensure she is appropriately prepared for this.  She understands and agrees.  Further recommendations pending results of her exams and her course.  PLAN: - complete abdominal US - assess for persistent ascites, HCC screening - labs today - BMET, AFP, ceruloplasmin level - low Na diet - continue diuretics - lasix / aldactone for now, adjust dosing PRN - take Miralax daily and titrate up as needed for bowels - schedule EGD and colonoscopy at the [facility] - double prep - consider CT if pain persists - f/u 6 months or sooner with issues  Harlin Rain, MD [facility]  CC: Tiffany Kocher, DO

## 2023-06-09 LAB — BASIC METABOLIC PANEL
BUN: 16 mg/dL (ref 7–25)
CO2: 25 mmol/L (ref 20–32)
Calcium: 9.7 mg/dL (ref 8.6–10.4)
Chloride: 103 mmol/L (ref 98–110)
Creat: 0.87 mg/dL (ref 0.50–1.05)
Glucose, Bld: 117 mg/dL — ABNORMAL HIGH (ref 65–99)
Potassium: 4.8 mmol/L (ref 3.5–5.3)
Sodium: 136 mmol/L (ref 135–146)

## 2023-06-09 LAB — AFP TUMOR MARKER: AFP-Tumor Marker: 5.9 ng/mL

## 2023-06-09 LAB — CERULOPLASMIN: Ceruloplasmin: 26 mg/dL (ref 14–48)

## 2023-06-12 ENCOUNTER — Ambulatory Visit: Payer: 59 | Admitting: Student

## 2023-06-22 ENCOUNTER — Other Ambulatory Visit: Payer: Self-pay | Admitting: Family Medicine

## 2023-06-23 ENCOUNTER — Other Ambulatory Visit: Payer: Self-pay

## 2023-06-23 DIAGNOSIS — H8110 Benign paroxysmal vertigo, unspecified ear: Secondary | ICD-10-CM

## 2023-06-23 DIAGNOSIS — R112 Nausea with vomiting, unspecified: Secondary | ICD-10-CM

## 2023-06-23 MED ORDER — FUROSEMIDE 40 MG PO TABS: 40.0000 mg | ORAL_TABLET | Freq: Every day | ORAL | 1 refills | Status: AC

## 2023-06-23 MED ORDER — ONDANSETRON HCL 4 MG PO TABS: 4.0000 mg | ORAL_TABLET | Freq: Three times a day (TID) | ORAL | 1 refills | Status: DC | PRN

## 2023-07-20 ENCOUNTER — Ambulatory Visit (HOSPITAL_COMMUNITY): Admission: RE | Admit: 2023-07-20 | Payer: 59 | Source: Ambulatory Visit

## 2023-07-20 ENCOUNTER — Ambulatory Visit: Payer: Self-pay | Admitting: Student

## 2023-07-24 ENCOUNTER — Other Ambulatory Visit: Payer: Self-pay

## 2023-07-25 MED ORDER — METHOCARBAMOL 500 MG PO TABS: 500.0000 mg | ORAL_TABLET | Freq: Two times a day (BID) | ORAL | 2 refills | Status: DC | PRN

## 2023-07-28 ENCOUNTER — Telehealth: Payer: Self-pay | Admitting: Gastroenterology

## 2023-07-28 NOTE — Telephone Encounter (Signed)
 Thank you Jenne Campus, I appreciate you looking into this.

## 2023-07-29 ENCOUNTER — Encounter: Payer: 59 | Admitting: Gastroenterology

## 2023-07-30 ENCOUNTER — Telehealth: Payer: Self-pay

## 2023-07-30 ENCOUNTER — Telehealth: Payer: Self-pay | Admitting: Student

## 2023-07-30 NOTE — Telephone Encounter (Signed)
 Patient called stating that she was recently seen in the hospital for sciatica and arthritis. Wants to get a referral to see a specialist. Did let patient know she would probably have to make an appt with her doctor, wants to know if she can do a virtual visit if possible as she is not sure she could get someone to bring her.

## 2023-07-30 NOTE — Telephone Encounter (Signed)
Routed message to PCP. Jacorie Ernsberger, CMA  

## 2023-07-30 NOTE — Telephone Encounter (Signed)
 Patient calls nurse line regarding prescriptions for furosemide  and spironolactone .   She states that she was told to take medications with different directions than what is indicated on prescriptions.   Per last OV note, patient was instructed to take 80 mg furosemide  with 200 mg spironolactone  every other day.   Pharmacist is needing updated directions if this is the dosing patient should be taking.   Please advise.   Chiquita JAYSON English, RN

## 2023-07-31 NOTE — Telephone Encounter (Signed)
 Spoke with patient informed of note left by provider. I gave her the address and phone number to the clinic that provider mentioned in note, [address], [place], [place] [address]   [phone]

## 2023-07-31 NOTE — Telephone Encounter (Signed)
 Attempted to call patient back to advise of provider message.   She did not answer, LVM asking that she return call to office.   Veronda Prude, RN

## 2023-08-07 ENCOUNTER — Other Ambulatory Visit: Payer: Self-pay | Admitting: Family Medicine

## 2023-08-21 ENCOUNTER — Telehealth: Payer: Self-pay

## 2023-08-21 NOTE — Telephone Encounter (Signed)
Patient calls nurse line requesting virtual appointment with Dr. Claudean Severance. She reports that for the last 3-4 weeks she has been having issues with pimples on the sides of her nose. She states that they will clear up on one side and then they will develop on the other. She denies fever or chills.   She states that she is physically unable to come into the office at this time due to sciatic pain.   Will forward to PCP for further advisement.   Veronda Prude, RN

## 2023-08-24 NOTE — Telephone Encounter (Signed)
Attempted to return call to patient to schedule virtual visit. Patient reported during initial call that she does have capability to have mychart video visit.  She did not answer, LVM asking that she return call to office.   When patient calls back, please assist her in scheduling virtual visit.

## 2023-09-07 ENCOUNTER — Other Ambulatory Visit: Payer: Self-pay

## 2023-09-07 DIAGNOSIS — K219 Gastro-esophageal reflux disease without esophagitis: Secondary | ICD-10-CM

## 2023-09-07 MED ORDER — OMEPRAZOLE 20 MG PO CPDR
20.0000 mg | DELAYED_RELEASE_CAPSULE | Freq: Every evening | ORAL | 1 refills | Status: DC
Start: 2023-09-07 — End: 2023-11-28

## 2023-09-08 ENCOUNTER — Encounter: Payer: Self-pay | Admitting: Gastroenterology

## 2023-09-18 ENCOUNTER — Encounter: Payer: Self-pay | Admitting: Student

## 2023-09-18 ENCOUNTER — Ambulatory Visit: Payer: 59 | Admitting: Student

## 2023-09-18 VITALS — BP 132/73 | HR 81 | Ht 64.0 in | Wt 197.4 lb

## 2023-09-18 DIAGNOSIS — Z9989 Dependence on other enabling machines and devices: Secondary | ICD-10-CM

## 2023-09-18 DIAGNOSIS — J3489 Other specified disorders of nose and nasal sinuses: Secondary | ICD-10-CM

## 2023-09-18 DIAGNOSIS — K7469 Other cirrhosis of liver: Secondary | ICD-10-CM | POA: Diagnosis not present

## 2023-09-18 DIAGNOSIS — N3941 Urge incontinence: Secondary | ICD-10-CM

## 2023-09-18 DIAGNOSIS — Z7409 Other reduced mobility: Secondary | ICD-10-CM

## 2023-09-18 MED ORDER — MUPIROCIN 2 % EX OINT: 1.0000 | TOPICAL_OINTMENT | Freq: Two times a day (BID) | CUTANEOUS | 0 refills | Status: AC

## 2023-09-18 NOTE — Progress Notes (Signed)
    SUBJECTIVE:   CHIEF COMPLAINT / HPI:   Sores on nose For several weeks patient has had small pimples on her nose and inside her nostrils.  Patient has been popping the nose pimples.  No fevers, they do not ulcerate.  DME needs Patient has COPD, decompensated liver failure (with recurrent ascites), mixed Alzheimer's/vascular dementia, impaired functional mobility, and urge incontinence.  She requires frequent turns to prevent bedsores, lives alone with minimal assistance from family, and has improved respirations with the head of the bed elevated at least 30 degrees.  Given her mobility issues and urge incontinence, she does have difficulty getting to the bathroom.  Given her difficulty with ambulation but desire to be up and out of the bed, patient would benefit from an electric recliner so she can adjust settings on her own.  For the reasons listed above, patient would benefit from hospital bed, bedside commode and electric recliner.   OBJECTIVE:   BP (!) 141/70   Pulse 81   Ht 5\' 4"  (1.626 m)   Wt 197 lb 6 oz (89.5 kg)   SpO2 95%   BMI 33.88 kg/m    General: NAD, pleasant, HEENT: Small papules, no ulcerations around and inside nose.  No erythema, not painful to touch. Cardio: RRR, no MRG. Cap Refill <2s. Respiratory: CTAB, normal wob on RA  ASSESSMENT/PLAN:   Assessment & Plan Sore in nose Suspect nasal folliculitis.  Low concern for HSV, as lesions are not consistent.  Lesions are not consistent with rosacea.  No evidence of cellulitis/impetigo. - Trial topical mupirocin  DME needs - See additional diagnoses attached to this encounter for details.  See documentation above for necessity. - Bedside commode, hospital bed, left recliner  Additionally, patient requests personal care services.  Recommend patient reach out to personal care service agency, and they will initiate the process.   Tiffany Kocher, DO [facility]

## 2023-09-18 NOTE — Patient Instructions (Addendum)
 It was great to see you! Thank you for allowing me to participate in your care!   I recommend that you always bring your medications to each appointment as this makes it easy to ensure we are on the correct medications and helps [place] not miss when refills are needed.  Our plans for today:  -Please use mupirocin ointment twice a day on the outside of the nose, and inside of the nose where sores are located for 7 days. -Please go online and find a personal care service agency, they will come to evaluate and initiate required paperwork if they believe you can qualify for services -I will place an order for electric grinder, bedside commode, and hospital bed-this may take several weeks   Take care and seek immediate care sooner if you develop any concerns. Please remember to show up 15 minutes before your scheduled appointment time!  Tiffany Kocher, DO [facility]

## 2023-10-02 ENCOUNTER — Other Ambulatory Visit: Payer: Self-pay | Admitting: Neurosurgery

## 2023-10-05 ENCOUNTER — Other Ambulatory Visit: Payer: Self-pay | Admitting: Neurosurgery

## 2023-10-07 NOTE — Progress Notes (Signed)
 Surgical Instructions   Your procedure is scheduled on October 13, 2023. Report to [facility] Main Entrance "A" at 8:10 A.M., then check in with the Admitting office. Any questions or running late day of surgery: call [phone]  Questions prior to your surgery date: call [phone], Monday-Friday, 8am-4pm. If you experience any cold or flu symptoms such as cough, fever, chills, shortness of breath, etc. between now and your scheduled surgery, please notify us at the above number.     Remember:  Do not eat or drink after midnight the night before your surgery      Take these medicines the morning of surgery with A SIP OF WATER  atorvastatin (LIPITOR)  DULoxetine (CYMBALTA)  gabapentin (NEURONTIN)   May take these medicines IF NEEDED: albuterol (PROVENTIL) nebulizer solution  albuterol (VENTOLIN HFA) inhaler May bring with you methocarbamol (ROBAXIN)  ondansetron (ZOFRAN)   One week prior to surgery, STOP taking any Aspirin (unless otherwise instructed by your surgeon) Aleve, Naproxen, Ibuprofen, Motrin, Advil, Goody's, BC's, all herbal medications, fish oil, and non-prescription vitamins.            WHAT DO I DO ABOUT MY DIABETES MEDICATION?   Do not take oral diabetes medicines (pills) the morning of surgery that includes your metFORMIN (GLUCOPHAGE) and empagliflozin (JARDIANCE) Last dose 10-09-23  The day of surgery, do not take other diabetes injectables, including Byetta (exenatide), Bydureon (exenatide ER), Victoza (liraglutide), or Trulicity (dulaglutide).  If your CBG is greater than 220 mg/dL, you may take  of your sliding scale (correction) dose of insulin.   HOW TO MANAGE YOUR DIABETES BEFORE AND AFTER SURGERY  Why is it important to control my blood sugar before and after surgery? Improving blood sugar levels before and after surgery helps healing and can limit problems. A way of improving blood sugar control is eating a healthy diet by:  Eating less sugar  and carbohydrates  Increasing activity/exercise  Talking with your doctor about reaching your blood sugar goals High blood sugars (greater than 180 mg/dL) can raise your risk of infections and slow your recovery, so you will need to focus on controlling your diabetes during the weeks before surgery. Make sure that the doctor who takes care of your diabetes knows about your planned surgery including the date and location.  How do I manage my blood sugar before surgery? Check your blood sugar at least 4 times a day, starting 2 days before surgery, to make sure that the level is not too high or low.  Check your blood sugar the morning of your surgery when you wake up and every 2 hours until you get to the Short Stay unit.  If your blood sugar is less than 70 mg/dL, you will need to treat for low blood sugar: Do not take insulin. Treat a low blood sugar (less than 70 mg/dL) with  cup of clear juice (cranberry or apple), 4 glucose tablets, OR glucose gel. Recheck blood sugar in 15 minutes after treatment (to make sure it is greater than 70 mg/dL). If your blood sugar is not greater than 70 mg/dL on recheck, call [phone] for further instructions. Report your blood sugar to the short stay nurse when you get to Short Stay.  If you are admitted to the hospital after surgery: Your blood sugar will be checked by the staff and you will probably be given insulin after surgery (instead of oral diabetes medicines) to make sure you have good blood sugar levels. The goal for blood sugar  control after surgery is 80-180 mg/dL.          Do NOT Smoke (Tobacco/Vaping) for 24 hours prior to your procedure.  If you use a CPAP at night, you may bring your mask/headgear for your overnight stay.   You will be asked to remove any contacts, glasses, piercing's, hearing aid's, dentures/partials prior to surgery. Please bring cases for these items if needed.    Patients discharged the day of surgery will not be  allowed to drive home, and someone needs to stay with them for 24 hours.  SURGICAL WAITING ROOM VISITATION Patients may have no more than 2 support people in the waiting area - these visitors may rotate.   Pre-op nurse will coordinate an appropriate time for 1 ADULT support person, who may not rotate, to accompany patient in pre-op.  Children under the age of 40 must have an adult with them who is not the patient and must remain in the main waiting area with an adult.  If the patient needs to stay at the hospital during part of their recovery, the visitor guidelines for inpatient rooms apply.  Please refer to the [facility] website for the visitor guidelines for any additional information.   If you received a COVID test during your pre-op visit  it is requested that you wear a mask when out in public, stay away from anyone that may not be feeling well and notify your surgeon if you develop symptoms. If you have been in contact with anyone that has tested positive in the last 10 days please notify you surgeon.      Pre-operative 5 CHG Bathing Instructions   You can play a key role in reducing the risk of infection after surgery. Your skin needs to be as free of germs as possible. You can reduce the number of germs on your skin by washing with CHG (chlorhexidine gluconate) soap before surgery. CHG is an antiseptic soap that kills germs and continues to kill germs even after washing.   DO NOT use if you have an allergy to chlorhexidine/CHG or antibacterial soaps. If your skin becomes reddened or irritated, stop using the CHG and notify one of our RNs at [phone].   Please shower with the CHG soap starting 4 days before surgery using the following schedule:     Please keep in mind the following:  DO NOT shave, including legs and underarms, starting the day of your first shower.   You may shave your face at any point before/day of surgery.  Place clean sheets on your bed the day you  start using CHG soap. Use a clean washcloth (not used since being washed) for each shower. DO NOT sleep with pets once you start using the CHG.   CHG Shower Instructions:  Wash your face and private area with normal soap. If you choose to wash your hair, wash first with your normal shampoo.  After you use shampoo/soap, rinse your hair and body thoroughly to remove shampoo/soap residue.  Turn the water OFF and apply about 3 tablespoons (45 ml) of CHG soap to a CLEAN washcloth.  Apply CHG soap ONLY FROM YOUR NECK DOWN TO YOUR TOES (washing for 3-5 minutes)  DO NOT use CHG soap on face, private areas, open wounds, or sores.  Pay special attention to the area where your surgery is being performed.  If you are having back surgery, having someone wash your back for you may be helpful. Wait 2 minutes after CHG soap  is applied, then you may rinse off the CHG soap.  Pat dry with a clean towel  Put on clean clothes/pajamas   If you choose to wear lotion, please use ONLY the CHG-compatible lotions that are listed below.  Additional instructions for the day of surgery: DO NOT APPLY any lotions, deodorants, cologne, or perfumes.   Do not bring valuables to the hospital. [facility] is not responsible for any belongings/valuables. Do not wear nail polish, gel polish, artificial nails, or any other type of covering on natural nails (fingers and toes) Do not wear jewelry or makeup Put on clean/comfortable clothes.  Please brush your teeth.  Ask your nurse before applying any prescription medications to the skin.     CHG Compatible Lotions   Aveeno Moisturizing lotion  Cetaphil Moisturizing Cream  Cetaphil Moisturizing Lotion  Clairol Herbal Essence Moisturizing Lotion, Dry Skin  Clairol Herbal Essence Moisturizing Lotion, Extra Dry Skin  Clairol Herbal Essence Moisturizing Lotion, Normal Skin  Curel Age Defying Therapeutic Moisturizing Lotion with Alpha Hydroxy  Curel Extreme Care Body Lotion   Curel Soothing Hands Moisturizing Hand Lotion  Curel Therapeutic Moisturizing Cream, Fragrance-Free  Curel Therapeutic Moisturizing Lotion, Fragrance-Free  Curel Therapeutic Moisturizing Lotion, Original Formula  Eucerin Daily Replenishing Lotion  Eucerin Dry Skin Therapy Plus Alpha Hydroxy Crme  Eucerin Dry Skin Therapy Plus Alpha Hydroxy Lotion  Eucerin Original Crme  Eucerin Original Lotion  Eucerin Plus Crme Eucerin Plus Lotion  Eucerin TriLipid Replenishing Lotion  Keri Anti-Bacterial Hand Lotion  Keri Deep Conditioning Original Lotion Dry Skin Formula Softly Scented  Keri Deep Conditioning Original Lotion, Fragrance Free Sensitive Skin Formula  Keri Lotion Fast Absorbing Fragrance Free Sensitive Skin Formula  Keri Lotion Fast Absorbing Softly Scented Dry Skin Formula  Keri Original Lotion  Keri Skin Renewal Lotion Keri Silky Smooth Lotion  Keri Silky Smooth Sensitive Skin Lotion  Nivea Body Creamy Conditioning Oil  Nivea Body Extra Enriched Lotion  Nivea Body Original Lotion  Nivea Body Sheer Moisturizing Lotion Nivea Crme  Nivea Skin Firming Lotion  NutraDerm 30 Skin Lotion  NutraDerm Skin Lotion  NutraDerm Therapeutic Skin Cream  NutraDerm Therapeutic Skin Lotion  ProShield Protective Hand Cream  Provon moisturizing lotion  Please read over the following fact sheets that you were given.

## 2023-10-08 ENCOUNTER — Encounter (HOSPITAL_COMMUNITY)
Admission: RE | Admit: 2023-10-08 | Discharge: 2023-10-08 | Disposition: A | Discharge status: Home / Self Care | Source: Ambulatory Visit | Attending: Neurosurgery | Admitting: Neurosurgery

## 2023-10-08 ENCOUNTER — Encounter (HOSPITAL_COMMUNITY): Payer: Self-pay

## 2023-10-08 ENCOUNTER — Other Ambulatory Visit: Payer: Self-pay

## 2023-10-08 VITALS — BP 127/71 | HR 79 | Temp 97.8°F | Resp 18 | Ht 64.0 in | Wt 197.0 lb

## 2023-10-08 DIAGNOSIS — Z01818 Encounter for other preprocedural examination: Secondary | ICD-10-CM | POA: Diagnosis not present

## 2023-10-08 DIAGNOSIS — Z01810 Encounter for preprocedural cardiovascular examination: Secondary | ICD-10-CM | POA: Diagnosis present

## 2023-10-08 DIAGNOSIS — Z79899 Other long term (current) drug therapy: Secondary | ICD-10-CM | POA: Insufficient documentation

## 2023-10-08 DIAGNOSIS — Z01812 Encounter for preprocedural laboratory examination: Secondary | ICD-10-CM | POA: Diagnosis present

## 2023-10-08 HISTORY — DX: Unspecified cirrhosis of liver: K74.60

## 2023-10-08 LAB — CBC
HCT: 36.5 % (ref 36.0–46.0)
Hemoglobin: 11.8 g/dL — ABNORMAL LOW (ref 12.0–15.0)
MCH: 29.6 pg (ref 26.0–34.0)
MCHC: 32.3 g/dL (ref 30.0–36.0)
MCV: 91.5 fL (ref 80.0–100.0)
Platelets: 125 10*3/uL — ABNORMAL LOW (ref 150–400)
RBC: 3.99 MIL/uL (ref 3.87–5.11)
RDW: 16 % — ABNORMAL HIGH (ref 11.5–15.5)
WBC: 4.5 10*3/uL (ref 4.0–10.5)
nRBC: 0 % (ref 0.0–0.2)

## 2023-10-08 LAB — COMPREHENSIVE METABOLIC PANEL
ALT: 17 U/L (ref 0–44)
AST: 25 U/L (ref 15–41)
Albumin: 3 g/dL — ABNORMAL LOW (ref 3.5–5.0)
Alkaline Phosphatase: 116 U/L (ref 38–126)
Anion gap: 9 (ref 5–15)
BUN: 6 mg/dL — ABNORMAL LOW (ref 8–23)
CO2: 24 mmol/L (ref 22–32)
Calcium: 8.9 mg/dL (ref 8.9–10.3)
Chloride: 106 mmol/L (ref 98–111)
Creatinine, Ser: 0.49 mg/dL (ref 0.44–1.00)
GFR, Estimated: >60 mL/min (ref >60)
Glucose, Bld: 119 mg/dL — ABNORMAL HIGH (ref 70–99)
Potassium: 3.5 mmol/L (ref 3.5–5.1)
Sodium: 139 mmol/L (ref 135–145)
Total Bilirubin: 0.9 mg/dL (ref 0.0–1.2)
Total Protein: 6.9 g/dL (ref 6.5–8.1)

## 2023-10-08 LAB — SURGICAL PCR SCREEN
MRSA, PCR: NEGATIVE
Staphylococcus aureus: POSITIVE — AB

## 2023-10-08 LAB — HEMOGLOBIN A1C
Hgb A1c MFr Bld: 6.6 % — ABNORMAL HIGH (ref 4.8–5.6)
Mean Plasma Glucose: 142.72 mg/dL

## 2023-10-08 LAB — GLUCOSE, CAPILLARY: Glucose-Capillary: 120 mg/dL — ABNORMAL HIGH (ref 70–99)

## 2023-10-08 NOTE — Progress Notes (Signed)
 Surgical Instructions   Your procedure is scheduled on October 13, 2023. Report to [facility] Main Entrance "A" at 8:10 A.M., then check in with the Admitting office. Any questions or running late day of surgery: call [phone]  Questions prior to your surgery date: call [phone], Monday-Friday, 8am-4pm. If you experience any cold or flu symptoms such as cough, fever, chills, shortness of breath, etc. between now and your scheduled surgery, please notify us at the above number.     Remember:  Do not eat or drink after midnight the night before your surgery      Take these medicines the morning of surgery with A SIP OF WATER  atorvastatin (LIPITOR)  DULoxetine (CYMBALTA)  gabapentin (NEURONTIN)   May take these medicines IF NEEDED: albuterol (PROVENTIL) nebulizer solution  albuterol (VENTOLIN HFA) inhaler May bring with you methocarbamol (ROBAXIN)  ondansetron (ZOFRAN)   One week prior to surgery, STOP taking any Aspirin (unless otherwise instructed by your surgeon) Aleve, Naproxen, Ibuprofen, Motrin, Advil, Goody's, BC's, all herbal medications, fish oil, and non-prescription vitamins.            WHAT DO I DO ABOUT MY DIABETES MEDICATION?   Do not take oral diabetes medicines (pills) the morning of surgery. Do not take metFORMIN (GLUCOPHAGE) the day of surgery. Hold empagliflozin (JARDIANCE) 72 hours prior to surgery.  Your last dose should be 10/09/23.  The day of surgery, do not take other diabetes injectables, including Byetta (exenatide), Bydureon (exenatide ER), Victoza (liraglutide), or Trulicity (dulaglutide).  If your CBG is greater than 220 mg/dL, you may take  of your sliding scale (correction) dose of insulin.   HOW TO MANAGE YOUR DIABETES BEFORE AND AFTER SURGERY  Why is it important to control my blood sugar before and after surgery? Improving blood sugar levels before and after surgery helps healing and can limit problems. A way of improving blood sugar  control is eating a healthy diet by:  Eating less sugar and carbohydrates  Increasing activity/exercise  Talking with your doctor about reaching your blood sugar goals High blood sugars (greater than 180 mg/dL) can raise your risk of infections and slow your recovery, so you will need to focus on controlling your diabetes during the weeks before surgery. Make sure that the doctor who takes care of your diabetes knows about your planned surgery including the date and location.  How do I manage my blood sugar before surgery? Check your blood sugar at least 4 times a day, starting 2 days before surgery, to make sure that the level is not too high or low.  Check your blood sugar the morning of your surgery when you wake up and every 2 hours until you get to the Short Stay unit.  If your blood sugar is less than 70 mg/dL, you will need to treat for low blood sugar: Do not take insulin. Treat a low blood sugar (less than 70 mg/dL) with  cup of clear juice (cranberry or apple), 4 glucose tablets, OR glucose gel. Recheck blood sugar in 15 minutes after treatment (to make sure it is greater than 70 mg/dL). If your blood sugar is not greater than 70 mg/dL on recheck, call [phone] for further instructions. Report your blood sugar to the short stay nurse when you get to Short Stay.  If you are admitted to the hospital after surgery: Your blood sugar will be checked by the staff and you will probably be given insulin after surgery (instead of oral diabetes medicines) to  make sure you have good blood sugar levels. The goal for blood sugar control after surgery is 80-180 mg/dL.          Do NOT Smoke (Tobacco/Vaping) for 24 hours prior to your procedure.  If you use a CPAP at night, you may bring your mask/headgear for your overnight stay.   You will be asked to remove any contacts, glasses, piercing's, hearing aid's, dentures/partials prior to surgery. Please bring cases for these items if needed.     Patients discharged the day of surgery will not be allowed to drive home, and someone needs to stay with them for 24 hours.  SURGICAL WAITING ROOM VISITATION Patients may have no more than 2 support people in the waiting area - these visitors may rotate.   Pre-op nurse will coordinate an appropriate time for 1 ADULT support person, who may not rotate, to accompany patient in pre-op.  Children under the age of 21 must have an adult with them who is not the patient and must remain in the main waiting area with an adult.  If the patient needs to stay at the hospital during part of their recovery, the visitor guidelines for inpatient rooms apply.  Please refer to the [facility] website for the visitor guidelines for any additional information.   If you received a COVID test during your pre-op visit  it is requested that you wear a mask when out in public, stay away from anyone that may not be feeling well and notify your surgeon if you develop symptoms. If you have been in contact with anyone that has tested positive in the last 10 days please notify you surgeon.      Pre-operative 5 CHG Bathing Instructions   You can play a key role in reducing the risk of infection after surgery. Your skin needs to be as free of germs as possible. You can reduce the number of germs on your skin by washing with CHG (chlorhexidine gluconate) soap before surgery. CHG is an antiseptic soap that kills germs and continues to kill germs even after washing.   DO NOT use if you have an allergy to chlorhexidine/CHG or antibacterial soaps. If your skin becomes reddened or irritated, stop using the CHG and notify one of our RNs at [phone].   Please shower with the CHG soap starting 4 days before surgery using the following schedule:     Please keep in mind the following:  DO NOT shave, including legs and underarms, starting the day of your first shower.   You may shave your face at any point before/day of  surgery.  Place clean sheets on your bed the day you start using CHG soap. Use a clean washcloth (not used since being washed) for each shower. DO NOT sleep with pets once you start using the CHG.   CHG Shower Instructions:  Wash your face and private area with normal soap. If you choose to wash your hair, wash first with your normal shampoo.  After you use shampoo/soap, rinse your hair and body thoroughly to remove shampoo/soap residue.  Turn the water OFF and apply about 3 tablespoons (45 ml) of CHG soap to a CLEAN washcloth.  Apply CHG soap ONLY FROM YOUR NECK DOWN TO YOUR TOES (washing for 3-5 minutes)  DO NOT use CHG soap on face, private areas, open wounds, or sores.  Pay special attention to the area where your surgery is being performed.  If you are having back surgery, having someone wash  your back for you may be helpful. Wait 2 minutes after CHG soap is applied, then you may rinse off the CHG soap.  Pat dry with a clean towel  Put on clean clothes/pajamas   If you choose to wear lotion, please use ONLY the CHG-compatible lotions that are listed below.  Additional instructions for the day of surgery: DO NOT APPLY any lotions, deodorants, cologne, or perfumes.   Do not bring valuables to the hospital. [facility] is not responsible for any belongings/valuables. Do not wear nail polish, gel polish, artificial nails, or any other type of covering on natural nails (fingers and toes) Do not wear jewelry or makeup Put on clean/comfortable clothes.  Please brush your teeth.  Ask your nurse before applying any prescription medications to the skin.     CHG Compatible Lotions   Aveeno Moisturizing lotion  Cetaphil Moisturizing Cream  Cetaphil Moisturizing Lotion  Clairol Herbal Essence Moisturizing Lotion, Dry Skin  Clairol Herbal Essence Moisturizing Lotion, Extra Dry Skin  Clairol Herbal Essence Moisturizing Lotion, Normal Skin  Curel Age Defying Therapeutic Moisturizing Lotion  with Alpha Hydroxy  Curel Extreme Care Body Lotion  Curel Soothing Hands Moisturizing Hand Lotion  Curel Therapeutic Moisturizing Cream, Fragrance-Free  Curel Therapeutic Moisturizing Lotion, Fragrance-Free  Curel Therapeutic Moisturizing Lotion, Original Formula  Eucerin Daily Replenishing Lotion  Eucerin Dry Skin Therapy Plus Alpha Hydroxy Crme  Eucerin Dry Skin Therapy Plus Alpha Hydroxy Lotion  Eucerin Original Crme  Eucerin Original Lotion  Eucerin Plus Crme Eucerin Plus Lotion  Eucerin TriLipid Replenishing Lotion  Keri Anti-Bacterial Hand Lotion  Keri Deep Conditioning Original Lotion Dry Skin Formula Softly Scented  Keri Deep Conditioning Original Lotion, Fragrance Free Sensitive Skin Formula  Keri Lotion Fast Absorbing Fragrance Free Sensitive Skin Formula  Keri Lotion Fast Absorbing Softly Scented Dry Skin Formula  Keri Original Lotion  Keri Skin Renewal Lotion Keri Silky Smooth Lotion  Keri Silky Smooth Sensitive Skin Lotion  Nivea Body Creamy Conditioning Oil  Nivea Body Extra Enriched Lotion  Nivea Body Original Lotion  Nivea Body Sheer Moisturizing Lotion Nivea Crme  Nivea Skin Firming Lotion  NutraDerm 30 Skin Lotion  NutraDerm Skin Lotion  NutraDerm Therapeutic Skin Cream  NutraDerm Therapeutic Skin Lotion  ProShield Protective Hand Cream  Provon moisturizing lotion  Please read over the following fact sheets that you were given.

## 2023-10-08 NOTE — Progress Notes (Signed)
 PCP - Tiffany Kocher, DO Cardiologist - denies  PPM/ICD - denies Device Orders -  Rep Notified -   Chest x-ray -  EKG - 10/08/23 Stress Test - denies ECHO - 09/17/22 Cardiac Cath - denies  Sleep Study - 03/09/18 CPAP - no  Fasting Blood Sugar - 130's Checks Blood Sugar one time a week   Last dose of GLP1 agonist- na GLP1 instructions:   Blood Thinner Instructions:na Aspirin Instructions:na  ERAS Protcol -no PRE-SURGERY Ensure or G2-   COVID TEST- na   Anesthesia review: no  Patient denies shortness of breath, fever, cough and chest pain at PAT appointment   All instructions explained to the patient, with a verbal understanding of the material. Patient agrees to go over the instructions while at home for a better understanding. Patient also instructed to wear a mask when out in public prior to surgery. The opportunity to ask questions was provided.

## 2023-10-12 ENCOUNTER — Other Ambulatory Visit: Payer: Self-pay | Admitting: Student

## 2023-10-13 ENCOUNTER — Telehealth: Payer: Self-pay | Admitting: Student

## 2023-10-13 ENCOUNTER — Ambulatory Visit (HOSPITAL_COMMUNITY)

## 2023-10-13 ENCOUNTER — Encounter (HOSPITAL_COMMUNITY)
Admission: RE | Disposition: A | Discharge status: Home / Self Care | Payer: Self-pay | Source: Home / Self Care | Attending: Neurosurgery

## 2023-10-13 ENCOUNTER — Encounter (HOSPITAL_COMMUNITY): Payer: Self-pay

## 2023-10-13 ENCOUNTER — Other Ambulatory Visit: Payer: Self-pay | Admitting: Family Medicine

## 2023-10-13 ENCOUNTER — Other Ambulatory Visit: Payer: Self-pay

## 2023-10-13 ENCOUNTER — Ambulatory Visit (HOSPITAL_BASED_OUTPATIENT_CLINIC_OR_DEPARTMENT_OTHER)

## 2023-10-13 ENCOUNTER — Ambulatory Visit (HOSPITAL_COMMUNITY)
Admission: RE | Admit: 2023-10-13 | Discharge: 2023-10-13 | Disposition: A | Discharge status: Home / Self Care | Attending: Neurosurgery | Admitting: Neurosurgery

## 2023-10-13 DIAGNOSIS — M8008XA Age-related osteoporosis with current pathological fracture, vertebra(e), initial encounter for fracture: Secondary | ICD-10-CM | POA: Insufficient documentation

## 2023-10-13 DIAGNOSIS — J449 Chronic obstructive pulmonary disease, unspecified: Secondary | ICD-10-CM | POA: Diagnosis not present

## 2023-10-13 DIAGNOSIS — E119 Type 2 diabetes mellitus without complications: Secondary | ICD-10-CM

## 2023-10-13 DIAGNOSIS — F39 Unspecified mood [affective] disorder: Secondary | ICD-10-CM

## 2023-10-13 DIAGNOSIS — I10 Essential (primary) hypertension: Secondary | ICD-10-CM | POA: Insufficient documentation

## 2023-10-13 DIAGNOSIS — Z8673 Personal history of transient ischemic attack (TIA), and cerebral infarction without residual deficits: Secondary | ICD-10-CM | POA: Insufficient documentation

## 2023-10-13 DIAGNOSIS — X58XXXA Exposure to other specified factors, initial encounter: Secondary | ICD-10-CM | POA: Diagnosis not present

## 2023-10-13 DIAGNOSIS — Z01818 Encounter for other preprocedural examination: Secondary | ICD-10-CM

## 2023-10-13 DIAGNOSIS — Z7984 Long term (current) use of oral hypoglycemic drugs: Secondary | ICD-10-CM | POA: Insufficient documentation

## 2023-10-13 DIAGNOSIS — F0280 Dementia in other diseases classified elsewhere without behavioral disturbance: Secondary | ICD-10-CM | POA: Insufficient documentation

## 2023-10-13 DIAGNOSIS — G894 Chronic pain syndrome: Secondary | ICD-10-CM

## 2023-10-13 DIAGNOSIS — S32050A Wedge compression fracture of fifth lumbar vertebra, initial encounter for closed fracture: Secondary | ICD-10-CM

## 2023-10-13 DIAGNOSIS — E1140 Type 2 diabetes mellitus with diabetic neuropathy, unspecified: Secondary | ICD-10-CM | POA: Insufficient documentation

## 2023-10-13 HISTORY — PX: KYPHOPLASTY: SHX5884

## 2023-10-13 LAB — GLUCOSE, CAPILLARY
Glucose-Capillary: 121 mg/dL — ABNORMAL HIGH (ref 70–99)
Glucose-Capillary: 116 mg/dL — ABNORMAL HIGH (ref 70–99)
Glucose-Capillary: 107 mg/dL — ABNORMAL HIGH (ref 70–99)

## 2023-10-13 SURGERY — KYPHOPLASTY
Anesthesia: General

## 2023-10-13 MED ORDER — FENTANYL CITRATE (PF) 100 MCG/2ML IJ SOLN: 25.0000 ug | INTRAMUSCULAR | Status: DC | PRN

## 2023-10-13 MED ORDER — ORAL CARE MOUTH RINSE: 15.0000 mL | Freq: Once | OROMUCOSAL | Status: AC

## 2023-10-13 MED ORDER — ORAL CARE MOUTH RINSE: 15.0000 mL | Freq: Once | OROMUCOSAL | Status: DC

## 2023-10-13 MED ORDER — 0.9 % SODIUM CHLORIDE (POUR BTL) OPTIME
TOPICAL | Status: DC | PRN
  Administered 2023-10-13: 1000 mL

## 2023-10-13 MED ORDER — LIDOCAINE-EPINEPHRINE 1 %-1:100000 IJ SOLN
INTRAMUSCULAR | Status: AC
  Filled 2023-10-13: qty 1

## 2023-10-13 MED ORDER — LACTATED RINGERS IV SOLN: INTRAVENOUS | Status: DC

## 2023-10-13 MED ORDER — CHLORHEXIDINE GLUCONATE CLOTH 2 % EX PADS: 6.0000 | MEDICATED_PAD | Freq: Once | CUTANEOUS | Status: DC

## 2023-10-13 MED ORDER — DEXAMETHASONE SODIUM PHOSPHATE 10 MG/ML IJ SOLN
INTRAMUSCULAR | Status: DC | PRN
  Administered 2023-10-13: 5 mg via INTRAVENOUS

## 2023-10-13 MED ORDER — ROCURONIUM BROMIDE 10 MG/ML (PF) SYRINGE
PREFILLED_SYRINGE | INTRAVENOUS | Status: DC | PRN
  Administered 2023-10-13: 50 mg via INTRAVENOUS
  Administered 2023-10-13: 20 mg via INTRAVENOUS

## 2023-10-13 MED ORDER — ONDANSETRON HCL 4 MG/2ML IJ SOLN
INTRAMUSCULAR | Status: DC | PRN
  Administered 2023-10-13: 4 mg via INTRAVENOUS

## 2023-10-13 MED ORDER — LIDOCAINE 2% (20 MG/ML) 5 ML SYRINGE
INTRAMUSCULAR | Status: AC
  Filled 2023-10-13: qty 5

## 2023-10-13 MED ORDER — ONDANSETRON HCL 4 MG/2ML IJ SOLN: 4.0000 mg | Freq: Four times a day (QID) | INTRAMUSCULAR | Status: DC | PRN

## 2023-10-13 MED ORDER — OXYCODONE HCL 5 MG PO TABS: 5.0000 mg | ORAL_TABLET | Freq: Once | ORAL | Status: DC | PRN

## 2023-10-13 MED ORDER — PROPOFOL 10 MG/ML IV BOLUS
INTRAVENOUS | Status: DC | PRN
  Administered 2023-10-13: 130 mg via INTRAVENOUS

## 2023-10-13 MED ORDER — PROPOFOL 10 MG/ML IV BOLUS
INTRAVENOUS | Status: AC
  Filled 2023-10-13: qty 20

## 2023-10-13 MED ORDER — CEFAZOLIN SODIUM-DEXTROSE 2-4 GM/100ML-% IV SOLN
2.0000 g | INTRAVENOUS | Status: AC
  Administered 2023-10-13: 2 g via INTRAVENOUS
  Filled 2023-10-13: qty 100

## 2023-10-13 MED ORDER — ROCURONIUM BROMIDE 10 MG/ML (PF) SYRINGE
PREFILLED_SYRINGE | INTRAVENOUS | Status: AC
  Filled 2023-10-13: qty 10

## 2023-10-13 MED ORDER — LIDOCAINE 2% (20 MG/ML) 5 ML SYRINGE
INTRAMUSCULAR | Status: DC | PRN
  Administered 2023-10-13: 90 mg via INTRAVENOUS

## 2023-10-13 MED ORDER — LIDOCAINE-EPINEPHRINE 1 %-1:100000 IJ SOLN
INTRAMUSCULAR | Status: DC | PRN
  Administered 2023-10-13: 3.5 mL

## 2023-10-13 MED ORDER — CHLORHEXIDINE GLUCONATE 0.12 % MT SOLN: 15.0000 mL | Freq: Once | OROMUCOSAL | Status: DC

## 2023-10-13 MED ORDER — BUPIVACAINE HCL (PF) 0.5 % IJ SOLN
INTRAMUSCULAR | Status: AC
  Filled 2023-10-13: qty 30

## 2023-10-13 MED ORDER — OXYCODONE-ACETAMINOPHEN 5-325 MG PO TABS: 1.0000 | ORAL_TABLET | ORAL | 0 refills | Status: DC | PRN

## 2023-10-13 MED ORDER — CHLORHEXIDINE GLUCONATE 0.12 % MT SOLN
15.0000 mL | Freq: Once | OROMUCOSAL | Status: AC
  Administered 2023-10-13: 15 mL via OROMUCOSAL
  Filled 2023-10-13: qty 15

## 2023-10-13 MED ORDER — PHENYLEPHRINE 80 MCG/ML (10ML) SYRINGE FOR IV PUSH (FOR BLOOD PRESSURE SUPPORT)
PREFILLED_SYRINGE | INTRAVENOUS | Status: DC | PRN
  Administered 2023-10-13 (×2): 80 ug via INTRAVENOUS
  Administered 2023-10-13: 160 ug via INTRAVENOUS

## 2023-10-13 MED ORDER — PHENYLEPHRINE HCL-NACL 20-0.9 MG/250ML-% IV SOLN
INTRAVENOUS | Status: DC | PRN
  Administered 2023-10-13: 25 ug/min via INTRAVENOUS

## 2023-10-13 MED ORDER — FENTANYL CITRATE (PF) 250 MCG/5ML IJ SOLN
INTRAMUSCULAR | Status: DC | PRN
  Administered 2023-10-13: 100 ug via INTRAVENOUS

## 2023-10-13 MED ORDER — BUPIVACAINE HCL (PF) 0.5 % IJ SOLN
INTRAMUSCULAR | Status: DC | PRN
  Administered 2023-10-13: 3.5 mL

## 2023-10-13 MED ORDER — PHENYLEPHRINE 80 MCG/ML (10ML) SYRINGE FOR IV PUSH (FOR BLOOD PRESSURE SUPPORT)
PREFILLED_SYRINGE | INTRAVENOUS | Status: AC
  Filled 2023-10-13: qty 10

## 2023-10-13 MED ORDER — SUGAMMADEX SODIUM 200 MG/2ML IV SOLN
INTRAVENOUS | Status: DC | PRN
  Administered 2023-10-13: 200 mg via INTRAVENOUS

## 2023-10-13 MED ORDER — IOHEXOL 300 MG/ML  SOLN
INTRAMUSCULAR | Status: DC | PRN
  Administered 2023-10-13: 100 mL

## 2023-10-13 MED ORDER — FENTANYL CITRATE (PF) 250 MCG/5ML IJ SOLN
INTRAMUSCULAR | Status: AC
  Filled 2023-10-13: qty 5

## 2023-10-13 MED ORDER — OXYCODONE HCL 5 MG/5ML PO SOLN: 5.0000 mg | Freq: Once | ORAL | Status: DC | PRN

## 2023-10-13 SURGICAL SUPPLY — 31 items
BAG COUNTER SPONGE SURGICOUNT (BAG) ×1 IMPLANT
BLADE CLIPPER SURG (BLADE) IMPLANT
CEMENT KYPHON C01A KIT/MIXER (Cement) IMPLANT
DERMABOND ADVANCED .7 DNX12 (GAUZE/BANDAGES/DRESSINGS) IMPLANT
DERMABOND ADVANCED .7 DNX6 (GAUZE/BANDAGES/DRESSINGS) IMPLANT
DRAPE C-ARM 42X72 X-RAY (DRAPES) ×1 IMPLANT
DRAPE INCISE IOBAN 66X45 STRL (DRAPES) ×1 IMPLANT
DRAPE LAPAROTOMY 100X72X124 (DRAPES) ×1 IMPLANT
DRAPE SURG 17X23 STRL (DRAPES) ×1 IMPLANT
DRAPE WARM FLUID 44X44 (DRAPES) IMPLANT
DURAPREP 26ML APPLICATOR (WOUND CARE) ×1 IMPLANT
GAUZE 4X4 16PLY ~~LOC~~+RFID DBL (SPONGE) IMPLANT
GLOVE BIO SURGEON STRL SZ7 (GLOVE) ×2 IMPLANT
GLOVE BIOGEL PI IND STRL 7.5 (GLOVE) ×2 IMPLANT
GLOVE ECLIPSE 7.5 STRL STRAW (GLOVE) ×1 IMPLANT
GOWN STRL REUS W/ TWL LRG LVL3 (GOWN DISPOSABLE) ×1 IMPLANT
GOWN STRL REUS W/ TWL XL LVL3 (GOWN DISPOSABLE) ×1 IMPLANT
GOWN STRL REUS W/TWL 2XL LVL3 (GOWN DISPOSABLE) IMPLANT
KIT BASIN OR (CUSTOM PROCEDURE TRAY) ×1 IMPLANT
KIT TURNOVER KIT B (KITS) ×1 IMPLANT
NDL HYPO 22X1.5 SAFETY MO (MISCELLANEOUS) ×1 IMPLANT
NEEDLE HYPO 22X1.5 SAFETY MO (MISCELLANEOUS) ×1 IMPLANT
NS IRRIG 1000ML POUR BTL (IV SOLUTION) ×1 IMPLANT
PACK SRG BSC III STRL LF ECLPS (CUSTOM PROCEDURE TRAY) ×1 IMPLANT
PAD ARMBOARD POSITIONER FOAM (MISCELLANEOUS) ×3 IMPLANT
SPIKE FLUID TRANSFER (MISCELLANEOUS) ×1 IMPLANT
SUT VIC AB 3-0 SH 8-18 (SUTURE) ×2 IMPLANT
TOWEL GREEN STERILE (TOWEL DISPOSABLE) ×1 IMPLANT
TOWEL GREEN STERILE FF (TOWEL DISPOSABLE) ×1 IMPLANT
TRAY KYPHOPAK 20/3 ONESTEP 1ST (MISCELLANEOUS) IMPLANT
WATER STERILE IRR 1000ML POUR (IV SOLUTION) ×1 IMPLANT

## 2023-10-13 NOTE — H&P (Signed)
 CC: back pain  HPI:    This is a 68 year old woman with liver cirrhosis and history of L4 kyphoplasty complicated by neurologic deficit requiring laminectomy who has a painful osteoporotic L5 compression fracture. Brace therapy, Activity restriction and medical therapy has failed to control her symptoms.   Patient Active Problem List   Diagnosis Date Noted   Dependent on walker for ambulation 09/18/2023   Cirrhosis of liver (HCC) 02/09/2023   Decompensated liver disease (HCC) 09/16/2022   Hypervolemia 09/16/2022   COPD (chronic obstructive pulmonary disease) (HCC) 08/29/2022   Allergic rhinitis 08/29/2022   Epistaxis 08/28/2022   Hypokalemia 08/28/2022   Chronic venous insufficiency 05/15/2022   Murmur 05/15/2022   Infection of lip 06/26/2021   Polypharmacy 02/25/2021   Lump on neck 05/19/2020   Dementia (HCC) 03/12/2020   COPD with chronic bronchitis (HCC) 01/30/2020   Adenomatous polyp 10/14/2019   Delayed wound healing 09/07/2019   History of lumbar laminectomy for spinal cord decompression 08/26/2019   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   Memory loss 08/10/2017   Urge incontinence 07/25/2016   Impaired functional mobility, balance, and endurance 07/24/2016   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/15/2015   DM2 (diabetes mellitus, type 2) (HCC) 04/26/2015   Benign paroxysmal positional vertigo 01/24/2015   Type 2 diabetes mellitus with diabetic neuropathy, unspecified (HCC) 04/26/2014   Vitamin D deficiency 07/11/2013   Mood disorder (HCC) 07/11/2013   Osteopenia 11/04/2012   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   Chronic pain syndrome 04/26/2010   Thyromegaly 09/17/2006   Morbid obesity (HCC) 09/17/2006   GERD (gastroesophageal reflux disease) 09/17/2006   Past Medical History:  Diagnosis Date   Acute blood loss anemia 08/29/2022   Allergy    Arthritis    Cirrhosis (HCC)    COPD (chronic  obstructive pulmonary disease) (HCC)    Diabetes mellitus, type 2 (HCC)    Excessive daytime sleepiness 11/12/2017   GERD (gastroesophageal reflux disease)    Goiter    History of kidney stones    Hypertension    Kidney stone    Memory loss    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   Osteoporosis    in lower back per pt    Partial small bowel obstruction (HCC) 01/30/2020   Plantar fasciitis    Snoring 08/28/2017   Stroke (HCC) 15 years ago    residual left sided weakness   Subacute maxillary sinusitis 05/19/2020   Tremors of nervous system    Uterovaginal prolapse, incomplete 06/10/2010   Qualifier: Diagnosis of  By: Humberto Seals NP, Darl Pikes     Vertigo     Past Surgical History:  Procedure Laterality Date   APPENDECTOMY     BLADDER SURGERY     x2   CATARACT EXTRACTION W/PHACO Left 05/06/2017   Procedure: CATARACT EXTRACTION PHACO AND INTRAOCULAR LENS PLACEMENT (IOC) LEFT DIABETIC;  Surgeon: Lockie Mola, MD;  Location: [facility];  Service: Ophthalmology;  Laterality: Left;  Diabetic - oral meds   CATARACT EXTRACTION W/PHACO Right 06/03/2017   Procedure: CATARACT EXTRACTION PHACO AND INTRAOCULAR LENS PLACEMENT (IOC) RIGHT DIABETIC;  Surgeon: Lockie Mola, MD;  Location: [facility];  Service: Ophthalmology;  Laterality: Right;  Diabetic - oral meds   CHOLECYSTECTOMY     COLONOSCOPY  03/14/2016   FOOT SURGERY     IR PARACENTESIS  09/17/2022   KYPHOPLASTY N/A 04/07/2019   Procedure: L4 KYPHOPLASTY;  Surgeon: Kennedy Bucker, MD;  Location:  [facility];  Service: Orthopedics;  Laterality: N/A;   TOTAL ABDOMINAL HYSTERECTOMY     TOTAL KNEE ARTHROPLASTY Left 09/21/2018   Procedure: TOTAL KNEE ARTHROPLASTY-LEFT NEEDS RNFA;  Surgeon: Kennedy Bucker, MD;  Location: [facility];  Service: Orthopedics;  Laterality: Left;    Medications Prior to Admission  Medication Sig Dispense Refill Last Dose/Taking   albuterol (PROVENTIL) (2.5 MG/3ML) 0.083%  nebulizer solution Take 3 mLs (2.5 mg total) by nebulization every 4 (four) hours as needed for wheezing. 75 mL 2 10/12/2023   atorvastatin (LIPITOR) 10 MG tablet Take 1 tablet (10 mg total) by mouth daily. 90 tablet 3 Past Week   Calcium Carbonate-Vitamin D (CALCIUM-D PO) Take 1 tablet by mouth daily.   Past Week   DULoxetine (CYMBALTA) 60 MG capsule Take 1 capsule (60 mg total) by mouth daily. 90 capsule 3 10/13/2023 at  6:00 AM   gabapentin (NEURONTIN) 800 MG tablet Take 1 tablet (800 mg total) by mouth 2 (two) times daily. 180 tablet 3 10/13/2023 at  6:00 AM   metFORMIN (GLUCOPHAGE) 1000 MG tablet Take 1 tablet (1,000 mg total) by mouth 2 (two) times daily with a meal. 180 tablet 3 10/12/2023   methocarbamol (ROBAXIN) 500 MG tablet TAKE 1 TABLET BY MOUTH TWICE DAILY AS NEEDED FOR MUSCLE SPASMS 60 tablet 2 10/13/2023 at  6:00 AM   Multiple Vitamins-Minerals ([employer] SPECTRAVITE WOMENS SENIOR PO) Take 1 tablet by mouth daily.    Past Week   omeprazole (PRILOSEC) 20 MG capsule Take 1 capsule (20 mg total) by mouth every evening. 90 capsule 1 10/04/2023   spironolactone (ALDACTONE) 100 MG tablet TAKE ONE (1) TABLET BY MOUTH EVERY DAY 90 tablet 3 Past Week   Accu-Chek FastClix Lancets MISC USE TO CHECK BLOOD SUGAR THREE TIMES DAILY (Patient not taking: Reported on 06/08/2023) 300 each 11    albuterol (VENTOLIN HFA) 108 (90 Base) MCG/ACT inhaler Inhale 1-2 puffs into the lungs every 6 (six) hours as needed for wheezing. (Patient not taking: Reported on 06/08/2023) 2 each 2    Alcohol Swabs (B-D SINGLE USE SWABS REGULAR) PADS USE  TO  CLEAN  SKIN  BEFORE CHECKING BLOOD SUGAR 300 each 11    Blood Glucose Monitoring Suppl (ACCU-CHEK GUIDE) w/Device KIT 1 Device by Does not apply route in the morning, at noon, and at bedtime. (Patient not taking: Reported on 06/08/2023) 1 kit 2    Capsaicin 0.05 % CREA Apply to feet twice daily. (Patient not taking: Reported on 06/08/2023) 57 g 2    empagliflozin (JARDIANCE) 25 MG  TABS tablet Take 1 tablet (25 mg total) by mouth daily. 90 tablet 3 10/09/2023   ferrous gluconate (FERGON) 324 MG tablet Take 1 tablet (324 mg total) by mouth daily with breakfast. 90 tablet 3 10/09/2023   furosemide (LASIX) 40 MG tablet Take 1 tablet (40 mg total) by mouth daily. Get your blood work checked 2-3 times per year on this medication. 90 tablet 1 10/09/2023   glucose blood (ACCU-CHEK GUIDE) test strip USE  TO CHECK BLOOD SUGAR THREE TIMES DAILY 300 each 12    glucose blood (ACCU-CHEK GUIDE) test strip 3 (three) times daily      ondansetron (ZOFRAN) 4 MG tablet Take 1 tablet (4 mg total) by mouth every 8 (eight) hours as needed for nausea or vomiting. 30 tablet 1 10/09/2023   OVER THE COUNTER MEDICATION Stool softener      oxyCODONE (OXY IR/ROXICODONE) 5 MG immediate release tablet Take 1 tablet (5 mg  total) by mouth every 6 (six) hours as needed for up to 60 doses for moderate pain. (Patient not taking: Reported on 05/21/2023) 60 tablet 0    Allergies  Allergen Reactions   Pregabalin Nausea And Vomiting   Hydrocodone-Acetaminophen Nausea And Vomiting    Social History   Tobacco Use   Smoking status: Never    Passive exposure: Never   Smokeless tobacco: Never  Substance Use Topics   Alcohol use: No    Family History  Problem Relation Age of Onset   Healthy Mother    Heart disease Father    Alcohol abuse Father    Alcohol abuse Brother    Heart disease Sister    Diabetes Sister    Breast cancer Daughter    Colon polyps Neg Hx    Colon cancer Neg Hx    Esophageal cancer Neg Hx    Rectal cancer Neg Hx    Stomach cancer Neg Hx      Review of Systems Pertinent items are noted in HPI.  Objective:   Patient Vitals for the past 8 hrs:  BP Temp Temp src Pulse Resp SpO2 Height Weight  10/13/23 0830 132/60 98.1 F (36.7 C) Oral 78 18 95 % 5\' 4"  (1.626 m) 89.4 kg   No intake/output data recorded. No intake/output data recorded.      General : Alert, cooperative, no  distress, appears stated age   Head:  Normocephalic/atraumatic    Eyes: PERRL, conjunctiva/corneas clear, EOM's intact. Fundi could not be visualized Neck: Supple Chest:  Respirations unlabored Chest wall: no tenderness or deformity Heart: Regular rate and rhythm Abdomen: Soft, nontender and nondistended Extremities: warm and well-perfused Skin: normal turgor, color and texture Neurologic:  Alert, oriented x 3.  Eyes open spontaneously. PERRL, EOMI, VFC, no facial droop. V1-3 intact.  No dysarthria, tongue protrusion symmetric.  CNII-XII intact. 3/5 strength in LEs       Data ReviewCBC:  Lab Results  Component Value Date   WBC 4.5 10/08/2023   RBC 3.99 10/08/2023   BMP:  Lab Results  Component Value Date   GLUCOSE 119 (H) 10/08/2023   GLUCOSE 92 10/21/2014   CO2 24 10/08/2023   CO2 26 10/21/2014   BUN 6 (L) 10/08/2023   BUN 25 05/21/2023   BUN 18 10/21/2014   CREATININE 0.49 10/08/2023   CREATININE 0.87 06/08/2023   CALCIUM 8.9 10/08/2023   CALCIUM 9.0 10/21/2014   Radiology review:  See clinic note for details  Assessment:   Active Problems:   * No active hospital problems. 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 As she feels her pain has persisted and not improved over the past 2 months, it is reasonable consider a L5 kyphoplasty for pain palliation. I did explain that ultimately this fracture would heal without a kyphoplasty and long-term outcome would be no different but there would be potential significant palliative benefit with kyphoplasty if she feels her quality of life is currently poor. She did wish to proceed. We will require medical clearance prior to the procedure. This would need to be done in the hospital given her comorbidities. All questions and concerns were answered.

## 2023-10-13 NOTE — Telephone Encounter (Signed)
 Patients sister dropped of form for PCS to be completed patients last visit was 09/18/2023. Patient would like forms faxed once completed. Placing forms in the [facility] teams folder.   Please Advise.   Thanks!

## 2023-10-13 NOTE — Telephone Encounter (Signed)
 Clinical info completed on Disability  form.  Placed form in PCP's box for completion.    When form is completed, please route note to "RN Team" and place in wall pocket in front office.   Aquilla Solian, CMA

## 2023-10-13 NOTE — Op Note (Signed)
 PREOP DIAGNOSIS: L5 osteoporotic compression fx   POSTOP DIAGNOSIS: L5 osteoporotic compression fx   PROCEDURE: L5 Kyphoplasty  SURGEON: Dr. Hoyt Koch, MD  ASSISTANT: None  ANESTHESIA: GETA  EBL: Minimal  SPECIMENS: None  DRAINS: None  COMPLICATIONS: None immediate  CONDITION: Hemodynamically stable to PCAU  HISTORY: [name] is a 68 y.o. 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  Pain management and activity restriction failed to improve her symptoms.  Risks, benefits, alternatives, and expected convalescence were discussed with her.  Risks discussed included, but were not limited to bleeding, pain, infection, scar, spinal fluid leak, neurologic deficit, , damage to nearby organs, and death.  Informed consent was obtained.   PROCEDURE IN DETAIL: After informed consent was obtained and witnessed, the patient was brought to the operating room. After induction of anesthesia, the patient was positioned on the operative table in the prone position. All pressure points were meticulously padded. Fluoroscopy was used to mark out the projection of the L5 pedicles on the skin. Skin incision was then marked out and prepped and draped in the usual sterile fashion.  After time out was conducted, bilateral stab incisions were made and Jamshidi needles were introduced. Under AP and lateral fluoroscopic guidance, bilateral L5 pedicles were canulated. Drill was then used to create a channel and the kyphoplasty balloon was placed and expanded to create a cavity.   PMMA cement was injected in each side under fluoroscopy, taking care to preserve the posterior cortex. The balloons were then removed, replaced with the trocars, and the Jamshidi needles removed.  Stab incisions were then closed with 3-0 vicryl suture and standard skin glue. The patient was then transferred to the stretcher and taken  to the PACU in stable hemodynamic condition.  At the end of the case all sponge, needle, and instrument counts were correct.

## 2023-10-13 NOTE — Transfer of Care (Signed)
 Immediate Anesthesia Transfer of Care Note  Patient: [name]  Procedure(s) Performed: KYPHOPLASTY LUMBAR FIVE  Patient Location: PACU  Anesthesia Type:General  Level of Consciousness: awake, alert , and oriented  Airway & Oxygen Therapy: Patient Spontanous Breathing and Patient connected to nasal cannula oxygen  Post-op Assessment: Report given to RN and Post -op Vital signs reviewed and stable  Post vital signs: Reviewed and stable  Last Vitals:  Vitals Value Taken Time  BP 145/66 10/13/23 1302  Temp    Pulse 73 10/13/23 1305  Resp 18 10/13/23 1305  SpO2 95 % 10/13/23 1305  Vitals shown include unfiled device data.  Last Pain:  Vitals:   10/13/23 0843  TempSrc:   PainSc: 8       Patients Stated Pain Goal: 0 (10/13/23 0843)  Complications: No notable events documented.

## 2023-10-13 NOTE — Anesthesia Procedure Notes (Signed)
 Procedure Name: Intubation Date/Time: 10/13/2023 11:39 AM  Performed by: Sandie Ano, CRNAPre-anesthesia Checklist: Patient identified, Emergency Drugs available, Suction available and Patient being monitored Patient Re-evaluated:Patient Re-evaluated prior to induction Oxygen Delivery Method: Circle System Utilized Preoxygenation: Pre-oxygenation with 100% oxygen Induction Type: IV induction Ventilation: Mask ventilation without difficulty Laryngoscope Size: Mac and 3 Grade View: Grade I Tube type: Oral Tube size: 7.0 mm Number of attempts: 1 Airway Equipment and Method: Stylet and Oral airway Placement Confirmation: ETT inserted through vocal cords under direct vision, positive ETCO2 and breath sounds checked- equal and bilateral Secured at: 23 cm Tube secured with: Tape Dental Injury: Teeth and Oropharynx as per pre-operative assessment

## 2023-10-13 NOTE — Anesthesia Preprocedure Evaluation (Signed)
 Anesthesia Evaluation  Patient identified by MRN, date of birth, ID band Patient awake    Reviewed: Allergy & Precautions, H&P , NPO status , Patient's Chart, lab work & pertinent test results  Airway Mallampati: II   Neck ROM: full    Dental   Pulmonary COPD   breath sounds clear to auscultation       Cardiovascular hypertension,  Rhythm:regular Rate:Normal     Neuro/Psych  PSYCHIATRIC DISORDERS     Dementia CVA    GI/Hepatic ,GERD  ,,  Endo/Other  diabetes, Type 2  Class 3 obesity  Renal/GU      Musculoskeletal  (+) Arthritis ,    Abdominal   Peds  Hematology  (+) Blood dyscrasia, anemia   Anesthesia Other Findings   Reproductive/Obstetrics                             Anesthesia Physical Anesthesia Plan  ASA: 3  Anesthesia Plan: General   Post-op Pain Management:    Induction: Intravenous  PONV Risk Score and Plan: 3 and Ondansetron, Dexamethasone and Treatment may vary due to age or medical condition  Airway Management Planned: Oral ETT  Additional Equipment:   Intra-op Plan:   Post-operative Plan: Extubation in OR  Informed Consent: I have reviewed the patients History and Physical, chart, labs and discussed the procedure including the risks, benefits and alternatives for the proposed anesthesia with the patient or authorized representative who has indicated his/her understanding and acceptance.     Dental advisory given  Plan Discussed with: CRNA, Anesthesiologist and Surgeon  Anesthesia Plan Comments:        Anesthesia Quick Evaluation

## 2023-10-14 ENCOUNTER — Other Ambulatory Visit: Payer: Self-pay

## 2023-10-14 ENCOUNTER — Encounter (HOSPITAL_COMMUNITY): Payer: Self-pay | Admitting: Neurosurgery

## 2023-10-14 DIAGNOSIS — E1169 Type 2 diabetes mellitus with other specified complication: Secondary | ICD-10-CM

## 2023-10-14 MED ORDER — ATORVASTATIN CALCIUM 10 MG PO TABS: 10.0000 mg | ORAL_TABLET | Freq: Every day | ORAL | 3 refills | Status: AC

## 2023-10-14 NOTE — Anesthesia Postprocedure Evaluation (Signed)
 Anesthesia Post Note  Patient: [name]  Procedure(s) Performed: KYPHOPLASTY LUMBAR FIVE     Patient location during evaluation: PACU Anesthesia Type: General Level of consciousness: awake and alert Pain management: pain level controlled Vital Signs Assessment: post-procedure vital signs reviewed and stable Respiratory status: spontaneous breathing, nonlabored ventilation, respiratory function stable and patient connected to nasal cannula oxygen Cardiovascular status: blood pressure returned to baseline and stable Postop Assessment: no apparent nausea or vomiting Anesthetic complications: no   No notable events documented.  Last Vitals:  Vitals:   10/13/23 1400 10/13/23 1415  BP: 130/64 132/68  Pulse: 76 76  Resp: 19 18  Temp:  36.6 C  SpO2: 96% 97%    Last Pain:  Vitals:   10/13/23 1303  TempSrc:   PainSc: Asleep                 Beula Joyner S

## 2023-10-16 ENCOUNTER — Telehealth: Payer: Self-pay

## 2023-10-16 NOTE — Telephone Encounter (Signed)
 Patient calls nurse line requesting DME order.   She is requesting an order for a raised commode. Advised she will need an updated OV note stating the need for this DME.  She reports she was just "here" in February. PCP may be willing to make an addendum stating the need for DME.   Advised will forward to PCP.

## 2023-10-16 NOTE — Telephone Encounter (Signed)
 Completed Physician portion of paper-work. RN team, please place office stamp on paper work. Please release notes from 05/21/2023 and 09/18/2023 ( I believe there is a request for office notes attached to the paper-work, which was signed by patient). Additionally, please inform patient she will need to write in her medicaid ID # located on page 1 of this form, as I do not have it. She will also need to fill in her demographics before turning in the paper-work.  Will place in RN triage box by end of day (10/16/2023)

## 2023-10-19 NOTE — Progress Notes (Addendum)
 Addendum:  Patient with urge incontinence, Impaired functional mobility, dependent on walker for ambulation. Trouble with standing from seated positions. -DME raised commode

## 2023-10-19 NOTE — Addendum Note (Signed)
 Addended by: Tiffany Kocher on: 10/19/2023 12:49 PM   Modules accepted: Orders

## 2023-10-19 NOTE — Telephone Encounter (Signed)
 Called patient to obtain Medicaid ID number and to verify home address.   Patient did not answer, LVM asking that she return call to office.   Once this information is complete, I can fax to provided number, as ROI was completed.   Veronda Prude, RN

## 2023-10-21 NOTE — Telephone Encounter (Signed)
 Community message sent to [employer]. Will await response.   Veronda Prude, RN

## 2023-10-27 NOTE — Telephone Encounter (Signed)
 Patient returns call to nurse line.   Verified address. She reports she is unsure what her Medicaid ID # is. She reports they never sent her a card.  I asked the FO staff to look her up in the system.   PCS form placed in fax pile.  Copy made for batch scanning.   A copy is up front for the patient.

## 2023-11-16 DIAGNOSIS — S32050A Wedge compression fracture of fifth lumbar vertebra, initial encounter for closed fracture: Secondary | ICD-10-CM | POA: Insufficient documentation

## 2023-11-19 ENCOUNTER — Telehealth: Payer: Self-pay

## 2023-11-19 NOTE — Telephone Encounter (Signed)
 Patient calls nurse line requesting a letter of support.   She reports she lives in a "retirement" apartment. She reports they will "properly" install a "high rise toilet seat" at no charge to her to her apartment.   She reports she just needs a letter stating the medical need for DME.  She requests we mail this to her home address.   Address has been confirmed.   Will forward to PCP.

## 2023-11-26 NOTE — Progress Notes (Signed)
 se     Brigitte Canard, PA-C [address] [place], [place]  [address] Phone: [phone]   Primary Care Physician: Lavada Porteous, DO  Primary Gastroenterologist:  Brigitte Canard, PA-C / Alvester Johnson, MD   Chief Complaint: F/U Cirrhosis; Discuss Repeat EGD / Colon      HPI:   [name] is a 68 y.o. female with history of colon polyps, constipation, cirrhosis, and diabetes is here for followup.  She last saw Dr. General Kenner 06/2023 to evaluate cirrhosis.  Treated for constipation with MiraLAX .  He ordered EGD and colonoscopy with 2-day prep (patient did not complete).  Drew labs. Ordered abdominal ultrasound (patient did not complete).  AFP negative.  Unfortunately, patient fell and broke her back in the past few months, therefore she was unable to complete EGD / Colon, or Abdominal Ultrasound.  She underwent back surgery 09/2023.  She is in a wheelchair and is still having a lot of back pain.  I on chronic pain meds.  She reports weight gain, abdominal swelling, and persistent constipation.  She has had increased nosebleeds for the past 2 weeks.  She is here today with her sister who is helping with her care.  She admits to occasional episode of bright red blood in her stool which she attributes to hemorrhoids and constipation.  She denies melena, hematochezia, nausea, vomiting, jaundice, or confusion.  07/2019 last colonoscopy by Dr. General Kenner: Inadequate prep.  Mild sigmoid diverticulosis.  Hemorrhoids.  Three  2 mm to 5 mm sessile tubular adenoma polyps removed from sigmoid colon.  Sigmoid colon had very restricted lumen.  Repeat colonoscopy in a few months with 2-day prep was recommended.  02/2016 colonoscopy: Adenomatous polyps removed.  RUQ US  09/16/22: IMPRESSION: 1. Hepatic fatty infiltration 2. Cirrhotic appearance of the liver. 3. Large ascites. - Underwent Paracentesis.     CT scan abdomen / pelvis 01/30/20: IMPRESSION: 1. Diffusely dilated and fluid-filled small  bowel loops with fecalization of small bowel contents. There is no transition point, however there is mild mesenteric edema and trace free fluid in the pelvis. Differential considerations include small bowel ileus versus early obstruction 2. Diffusely decreased hepatic density with suggestion of nodular contours about the anterior left lobe, raising concern for cirrhosis. Possible tiny micro nodules. Recommend correlation with cirrhosis risk factors. Consider further evaluation with hepatic protocol MRI for more detailed hepatic parenchyma characterization. Borderline splenomegaly. 3. Small hiatal hernia. 4. 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     Echo 09/17/22: EF 65-70% Grade I DD Mild AS  Current Outpatient Medications  Medication Sig Dispense Refill   Accu-Chek FastClix Lancets MISC USE TO CHECK BLOOD SUGAR THREE TIMES DAILY 300 each 11   albuterol  (PROVENTIL ) (2.5 MG/3ML) 0.083% nebulizer solution Take 3 mLs (2.5 mg total) by nebulization every 4 (four) hours as needed for wheezing. 75 mL 2   albuterol  (VENTOLIN  HFA) 108 (90 Base) MCG/ACT inhaler Inhale 1-2 puffs into the lungs every 6 (six) hours as needed for wheezing. 2 each 2   Alcohol  Swabs (B-D SINGLE USE SWABS REGULAR) PADS USE  TO  CLEAN  SKIN  BEFORE CHECKING BLOOD SUGAR 300 each 11   atorvastatin  (LIPITOR) 10 MG tablet Take 1 tablet (10 mg total) by mouth daily. 90 tablet 3   Blood Glucose Monitoring Suppl (ACCU-CHEK GUIDE) w/Device KIT 1 Device by Does not apply route in the morning, at noon, and at bedtime. 1 kit 2   Calcium  Carbonate-Vitamin D  (CALCIUM -D PO) Take  1 tablet by mouth daily.     Capsaicin  0.05 % CREA Apply to feet twice daily. 57 g 2   dimenhyDRINATE (DRAMAMINE) 50 MG tablet Take 50 mg by mouth every 6 (six) hours as needed.     DULoxetine  (CYMBALTA ) 60 MG capsule TAKE ONE CAPSULE BY MOUTH DAILY 90 capsule 3   empagliflozin  (JARDIANCE ) 25 MG TABS tablet Take 1 tablet (25 mg  total) by mouth daily. 90 tablet 3   ferrous gluconate  (FERGON) 324 MG tablet Take 1 tablet (324 mg total) by mouth daily with breakfast. 90 tablet 3   furosemide  (LASIX ) 40 MG tablet Take 1 tablet (40 mg total) by mouth daily. Get your blood work checked 2-3 times per year on this medication. 90 tablet 1   gabapentin  (NEURONTIN ) 800 MG tablet TAKE ONE (1) TABLET BY MOUTH TWO (2) TIMES DAILY 180 tablet 3   glucose blood (ACCU-CHEK GUIDE) test strip USE  TO CHECK BLOOD SUGAR THREE TIMES DAILY 300 each 12   glucose blood (ACCU-CHEK GUIDE) test strip 3 (three) times daily     linaclotide (LINZESS) 145 MCG CAPS capsule Take 1 capsule (145 mcg total) by mouth daily before breakfast.     linaclotide (LINZESS) 290 MCG CAPS capsule Take 1 capsule (290 mcg total) by mouth daily before breakfast.     metFORMIN  (GLUCOPHAGE ) 1000 MG tablet TAKE 1 TABLET BY MOUTH TWICE DAILY WITH MEALS 180 tablet 3   methocarbamol  (ROBAXIN ) 500 MG tablet TAKE 1 TABLET BY MOUTH TWICE DAILY AS NEEDED FOR MUSCLE SPASMS 60 tablet 2   Multiple Vitamins-Minerals ([employer] SPECTRAVITE WOMENS SENIOR PO) Take 1 tablet by mouth daily.      naloxone (NARCAN) nasal spray 4 mg/0.1 mL Place 4 mg into the nose as needed.     omeprazole  (PRILOSEC) 20 MG capsule Take 1 capsule (20 mg total) by mouth every evening. 90 capsule 1   ondansetron  (ZOFRAN ) 4 MG tablet Take 1 tablet (4 mg total) by mouth every 8 (eight) hours as needed for nausea or vomiting. 30 tablet 1   OVER THE COUNTER MEDICATION Take 1 tablet by mouth daily. Stool softener     oxyCODONE -acetaminophen  (PERCOCET) 5-325 MG tablet Take 1 tablet by mouth every 4 (four) hours as needed for severe pain (pain score 7-10). 20 tablet 0   spironolactone  (ALDACTONE ) 100 MG tablet TAKE ONE (1) TABLET BY MOUTH EVERY DAY 90 tablet 3   traMADol  (ULTRAM ) 50 MG tablet Take 50 mg by mouth every 8 (eight) hours as needed.     No current facility-administered medications for this visit.    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   Osteoporosis    in lower back per pt    Partial small bowel obstruction (HCC) 01/30/2020   Plantar fasciitis    Snoring 08/28/2017   Stroke (HCC) 15  years ago    residual left sided weakness   Subacute maxillary sinusitis 05/19/2020   Tremors of nervous system    Uterovaginal prolapse, incomplete 06/10/2010   Qualifier: Diagnosis of  By: Tona Francis NP, Amalia Badder     Vertigo     Past Surgical History:  Procedure Laterality Date   APPENDECTOMY     BLADDER SURGERY     x2   CATARACT EXTRACTION W/PHACO Left 05/06/2017   Procedure: CATARACT EXTRACTION PHACO AND INTRAOCULAR LENS PLACEMENT (IOC) LEFT DIABETIC;  Surgeon: Annell Kidney, MD;  Location: [facility];  Service: Ophthalmology;  Laterality: Left;  Diabetic - oral meds   CATARACT EXTRACTION W/PHACO Right 06/03/2017   Procedure: CATARACT EXTRACTION PHACO AND INTRAOCULAR LENS PLACEMENT (IOC) RIGHT DIABETIC;  Surgeon: Annell Kidney, MD;  Location: [facility];  Service: Ophthalmology;  Laterality: Right;  Diabetic - oral meds   CHOLECYSTECTOMY     COLONOSCOPY  03/14/2016   FOOT SURGERY     IR PARACENTESIS  09/17/2022   KYPHOPLASTY N/A 04/07/2019   Procedure: L4 KYPHOPLASTY;  Surgeon: Molli Angelucci, MD;  Location: [facility];  Service: Orthopedics;  Laterality: N/A;   KYPHOPLASTY N/A 10/13/2023    Procedure: KYPHOPLASTY LUMBAR FIVE;  Surgeon: Van Gelinas, MD;  Location[facility] OR;  Service: Neurosurgery;  Laterality: N/A;   TOTAL ABDOMINAL HYSTERECTOMY     TOTAL KNEE ARTHROPLASTY Left 09/21/2018   Procedure: TOTAL KNEE ARTHROPLASTY-LEFT NEEDS RNFA;  Surgeon: Molli Angelucci, MD;  Location: [facility];  Service: Orthopedics;  Laterality: Left;    Review of Systems:    All systems reviewed and negative except where noted in HPI.    Physical Exam:  BP 114/70   Pulse 89   Ht 5\' 4"  (1.626 m)   Wt 199 lb (90.3 kg)   SpO2 98%   BMI 34.16 kg/m  No LMP recorded. Patient has had a hysterectomy.  General: Well-nourished, obese, sitting in a wheelchair.  Not able to get on the exam table. Eyes: Anicteric Lungs: Clear to auscultation bilaterally. Non-labored. Heart: Regular rate and rhythm; 3 out of 6 systolic murmur; no rubs or gallops.  Abdomen: Bowel sounds are normal; Abdomen is Soft and very obese, distended.  Unable to determine if ascites is present due to large size of abdomen.;  Mild generalized abdominal Tenderness; No guarding or rebound tenderness. Neuro: Alert and oriented x 3.  Grossly intact.  Answers questions appropriately.  Sitting in a wheelchair.  Not able to get on exam table. Psych: Alert and cooperative, normal mood and affect.  She does not appear confused. Skin: No jaundice   Imaging Studies: No results found.  Labs: CBC    Component Value Date/Time   WBC 4.5 10/08/2023 1120   RBC 3.99 10/08/2023 1120   HGB 11.8 (L) 10/08/2023 1120   HGB 13.0 02/05/2023 1553   HCT 36.5 10/08/2023 1120   HCT 38.0 02/05/2023 1553   PLT 125 (L) 10/08/2023 1120   PLT 140 (L) 02/05/2023 1553   MCV 91.5 10/08/2023 1120   MCV 96 02/05/2023 1553   MCV 97 10/21/2014 1320   MCH 29.6 10/08/2023 1120   MCHC 32.3 10/08/2023 1120   RDW 16.0 (H) 10/08/2023 1120   RDW 16.0 (H) 02/05/2023 1553   RDW 15.4 (H) 10/21/2014 1320   LYMPHSABS 1.6 09/24/2022 1341   LYMPHSABS 3.8 (H)  10/21/2014 1320   MONOABS 0.7 09/16/2022 1139   MONOABS 1.3 (H) 10/21/2014 1320   EOSABS 0.2 09/24/2022 1341   EOSABS 0.3  10/21/2014 1320   BASOSABS 0.1 09/24/2022 1341   BASOSABS 0.1 10/21/2014 1320    CMP     Component Value Date/Time   NA 139 10/08/2023 1120   NA 140 05/21/2023 1331   NA 137 10/21/2014 1320   K 3.5 10/08/2023 1120   K 3.7 10/21/2014 1320   CL 106 10/08/2023 1120   CL 107 10/21/2014 1320   CO2 24 10/08/2023 1120   CO2 26 10/21/2014 1320   GLUCOSE 119 (H) 10/08/2023 1120   GLUCOSE 92 10/21/2014 1320   BUN 6 (L) 10/08/2023 1120   BUN 25 05/21/2023 1331   BUN 18 10/21/2014 1320   CREATININE 0.49 10/08/2023 1120   CREATININE 0.87 06/08/2023 1507   CALCIUM  8.9 10/08/2023 1120   CALCIUM  9.0 10/21/2014 1320   PROT 6.9 10/08/2023 1120   PROT 6.9 05/21/2023 1331   ALBUMIN 3.0 (L) 10/08/2023 1120   ALBUMIN 3.6 (L) 05/21/2023 1331   AST 25 10/08/2023 1120   ALT 17 10/08/2023 1120   ALKPHOS 116 10/08/2023 1120   BILITOT 0.9 10/08/2023 1120   BILITOT 0.8 05/21/2023 1331   GFRNONAA >60 10/08/2023 1120   GFRNONAA >60 10/21/2014 1320   GFRAA >60 02/02/2020 0739   GFRAA >60 10/21/2014 1320       Assessment and Plan:   [name] is a 67 y.o. y/o female returns for follow-up of:  1.  MASH cirrhosis - Complete abdominal ultrasound Now and every 6 months to screen for hepatoma; Also evaluate for Ascites. - Labs: CBC, CMP, PT/INR, AFP - Low-sodium diet; low-fat diet. - We discussed scheduling EGD to screen for esophageal varices, however she would like to wait until she has recovered from back surgery to schedule procedure.  2.  Chronic Constipation - Gave samples of Linzess 145 mcg QD for 1 week, then 290 mcg QD for 1 week.  She will let me know which dose works best, and then we can send a prescription. - High-fiber diet - Drink 64 ounces of fluids daily.  3.  History of adenomatous colon polyps - We discussed scheduling a repeat colonoscopy,  however patient wants to wait until she has recovered from back surgery to schedule procedure. -I also want to update labs and ultrasound to make sure she is stable from a cirrhosis standpoint before scheduling colonoscopy.  4.  Recent Back Surgery 09/2023; Chronic Pain - Continue follow-up with neurosurgeon.  Brigitte Canard, PA-C  Follow up in 4 weeks with TG and 8 weeks with Dr. General Kenner.

## 2023-11-27 ENCOUNTER — Other Ambulatory Visit (INDEPENDENT_AMBULATORY_CARE_PROVIDER_SITE_OTHER)

## 2023-11-27 ENCOUNTER — Telehealth: Payer: Self-pay | Admitting: Physician Assistant

## 2023-11-27 ENCOUNTER — Other Ambulatory Visit: Payer: Self-pay | Admitting: Student

## 2023-11-27 ENCOUNTER — Encounter: Payer: Self-pay | Admitting: Physician Assistant

## 2023-11-27 ENCOUNTER — Ambulatory Visit (INDEPENDENT_AMBULATORY_CARE_PROVIDER_SITE_OTHER): Admitting: Physician Assistant

## 2023-11-27 VITALS — BP 114/70 | HR 89 | Ht 64.0 in | Wt 199.0 lb

## 2023-11-27 DIAGNOSIS — R188 Other ascites: Secondary | ICD-10-CM

## 2023-11-27 DIAGNOSIS — K219 Gastro-esophageal reflux disease without esophagitis: Secondary | ICD-10-CM

## 2023-11-27 DIAGNOSIS — K7460 Unspecified cirrhosis of liver: Secondary | ICD-10-CM

## 2023-11-27 DIAGNOSIS — K7581 Nonalcoholic steatohepatitis (NASH): Secondary | ICD-10-CM | POA: Diagnosis not present

## 2023-11-27 DIAGNOSIS — K5909 Other constipation: Secondary | ICD-10-CM | POA: Diagnosis not present

## 2023-11-27 DIAGNOSIS — Z860101 Personal history of adenomatous and serrated colon polyps: Secondary | ICD-10-CM

## 2023-11-27 DIAGNOSIS — Z9889 Other specified postprocedural states: Secondary | ICD-10-CM

## 2023-11-27 DIAGNOSIS — Z860100 Personal history of colon polyps, unspecified: Secondary | ICD-10-CM

## 2023-11-27 LAB — COMPREHENSIVE METABOLIC PANEL WITH GFR
ALT: 17 U/L (ref 0–35)
AST: 28 U/L (ref 0–37)
Albumin: 3.5 g/dL (ref 3.5–5.2)
Alkaline Phosphatase: 163 U/L — ABNORMAL HIGH (ref 39–117)
BUN: 13 mg/dL (ref 6–23)
CO2: 25 meq/L (ref 19–32)
Calcium: 9.3 mg/dL (ref 8.4–10.5)
Chloride: 105 meq/L (ref 96–112)
Creatinine, Ser: 0.52 mg/dL (ref 0.40–1.20)
GFR: 96.48 mL/min (ref >60.00)
Glucose, Bld: 120 mg/dL — ABNORMAL HIGH (ref 70–99)
Potassium: 3.8 meq/L (ref 3.5–5.1)
Sodium: 138 meq/L (ref 135–145)
Total Bilirubin: 0.6 mg/dL (ref 0.2–1.2)
Total Protein: 7.4 g/dL (ref 6.0–8.3)

## 2023-11-27 LAB — PROTIME-INR
INR: 1.4 ratio — ABNORMAL HIGH (ref 0.8–1.0)
Prothrombin Time: 14.2 s — ABNORMAL HIGH (ref 9.6–13.1)

## 2023-11-27 LAB — CBC WITH DIFFERENTIAL/PLATELET
Basophils Absolute: 0 10*3/uL (ref 0.0–0.1)
Basophils Relative: 0.7 % (ref 0.0–3.0)
Eosinophils Absolute: 0.1 10*3/uL (ref 0.0–0.7)
Eosinophils Relative: 2.3 % (ref 0.0–5.0)
HCT: 37.2 % (ref 36.0–46.0)
Hemoglobin: 11.8 g/dL — ABNORMAL LOW (ref 12.0–15.0)
Lymphocytes Relative: 27.3 % (ref 12.0–46.0)
Lymphs Abs: 1.3 10*3/uL (ref 0.7–4.0)
MCHC: 31.8 g/dL (ref 30.0–36.0)
MCV: 92.6 fl (ref 78.0–100.0)
Monocytes Absolute: 0.8 10*3/uL (ref 0.1–1.0)
Monocytes Relative: 17.2 % — ABNORMAL HIGH (ref 3.0–12.0)
Neutro Abs: 2.5 10*3/uL (ref 1.4–7.7)
Neutrophils Relative %: 52.5 % (ref 43.0–77.0)
Platelets: 145 10*3/uL — ABNORMAL LOW (ref 150.0–400.0)
RBC: 4.01 Mil/uL (ref 3.87–5.11)
RDW: 19.1 % — ABNORMAL HIGH (ref 11.5–15.5)
WBC: 4.7 10*3/uL (ref 4.0–10.5)

## 2023-11-27 MED ORDER — LINACLOTIDE 145 MCG PO CAPS: 145.0000 ug | ORAL_CAPSULE | Freq: Every day | ORAL | Status: DC

## 2023-11-27 MED ORDER — LINACLOTIDE 290 MCG PO CAPS
290.0000 ug | ORAL_CAPSULE | Freq: Every day | ORAL | Status: AC
Start: 2023-11-27 — End: 2024-03-03

## 2023-11-27 NOTE — Progress Notes (Signed)
 Agree with assessment and plan as outlined.

## 2023-11-27 NOTE — Telephone Encounter (Signed)
 Letter has been drafted.  Routed to admin to mail to patient.

## 2023-11-27 NOTE — Telephone Encounter (Signed)
 Patient inquiring about pain meds. Please advise.   Thank you

## 2023-11-27 NOTE — Patient Instructions (Addendum)
 _______________________________________________________  If your blood pressure at your visit was 140/90 or greater, please contact your primary care physician to follow up on this. _______________________________________________________  If you are age 67 or older, your body mass index should be between 23-30. Your Body mass index is 34.16 kg/m. If this is out of the aforementioned range listed, please consider follow up with your Primary Care Provider. ______________________________________________________  The [facility] providers would like to encourage you to use MYCHART to communicate with providers for non-urgent requests or questions.  Due to long hold times on the telephone, sending your provider a message by [facility] may be a faster and more efficient way to get a response.  Please allow 48 business hours for a response.  Please remember that this is for non-urgent requests.  _______________________________________________________  We have given you samples of the following medication to take: START: Linzess 145mcg daily before breakfast.  You can increase to Linzess 290 mcg daily before breakfast if needed.  Please let us  know if this medication works well for you.  If it does, we will send in a prescription.  Linzess 145 mcg LOT# [id number] exp 12-2023 Linzess 290 mcg LOT# [id number] exp 11-2024  Linzess works best when taken once a day every day, on an empty stomach, at least 30 minutes before your first meal of the day.  When Linzess is taken daily as directed:  *Constipation relief is typically felt in about a week *IBS-C patients may begin to experience relief from belly pain and overall abdominal symptoms (pain, discomfort, and bloating) in about 1 week,   with symptoms typically improving over 12 weeks.  Diarrhea may occur in the first 2 weeks -keep taking it.  The diarrhea should go away and you should start having normal, complete, full bowel movements. It may be helpful to  start treatment when you can be near the comfort of your own bathroom, such as a weekend.   Your provider has requested that you go to the basement level for lab work before leaving today. Press "B" on the elevator. The lab is located at the first door on the left as you exit the elevator.  You have been scheduled for an abdominal ultrasound at [facility] (1st floor of hospital) on _________  at ___________. Please arrive 30 minutes prior to your appointment for registration. Make certain not to have anything to eat or drink 6 hours prior to your appointment. Should you need to reschedule your appointment, please contact radiology at [phone]. This test typically takes about 30 minutes to perform.  Due to recent changes in healthcare laws, you may see the results of your imaging and laboratory studies on MyChart before your provider has had a chance to review them.  We understand that in some cases there may be results that are confusing or concerning to you. Not all laboratory results come back in the same time frame and the provider may be waiting for multiple results in order to interpret others.  Please give us  48 hours in order for your provider to thoroughly review all the results before contacting the office for clarification of your results.   Thank you for entrusting me with your care and choosing [facility].  Brigitte Canard, PA-C

## 2023-11-30 ENCOUNTER — Encounter: Payer: Self-pay | Admitting: Student

## 2023-11-30 LAB — AFP TUMOR MARKER: AFP-Tumor Marker: 5 ng/mL

## 2023-12-01 ENCOUNTER — Ambulatory Visit: Payer: Self-pay | Admitting: *Deleted

## 2023-12-01 NOTE — Telephone Encounter (Signed)
 Patient is advised that unfortunately, we cannot provide her with pain medication. Recommended that she contact her neurosurgeon for chronic pain medications if required. She states she actually has an upcoming appointment with him for this. In addition, she is reminded that constipation can be made worse by chronic pain medication so she should limit any pain medication as much as possible. Reminded her to take Linzess for chronic constipation and let us  know if this works well. We can send a script for that if she would like. She verbalizes understanding.

## 2023-12-02 ENCOUNTER — Telehealth: Payer: Self-pay | Admitting: Physician Assistant

## 2023-12-02 NOTE — Telephone Encounter (Signed)
 Patient called and stated that she would like to talk about her results she had received. Patient is requesting a call back. Please advise.

## 2023-12-02 NOTE — Telephone Encounter (Signed)
 Patient states she does not have any questions that she needs answered at this time.

## 2023-12-10 ENCOUNTER — Ambulatory Visit (HOSPITAL_COMMUNITY)
Admission: RE | Admit: 2023-12-10 | Discharge: 2023-12-10 | Disposition: A | Discharge status: Home / Self Care | Source: Ambulatory Visit | Attending: Physician Assistant | Admitting: Physician Assistant

## 2023-12-10 DIAGNOSIS — K7460 Unspecified cirrhosis of liver: Secondary | ICD-10-CM | POA: Insufficient documentation

## 2023-12-10 DIAGNOSIS — R188 Other ascites: Secondary | ICD-10-CM | POA: Insufficient documentation

## 2023-12-11 NOTE — Progress Notes (Signed)
 Call and notify patient complete abdominal ultrasound shows: 1.  Stable liver cirrhosis. 2.  No liver masses or lesions. 3.  Benign cyst in the right kidney.  Mild fluid in the right kidney (hydronephrosis).  Follow-up with PCP for this. 4.  No evidence of abdominal ascites. Continue with current plan. Brigitte Canard, PA-C

## 2023-12-27 NOTE — Progress Notes (Deleted)
 Ellen Console, PA-C [address] [place], [place]  [address] Phone: [phone]   Primary Care Physician: Howell Lunger, DO  Primary Gastroenterologist:  Ellen Console, PA-C / Elspeth Naval, MD   Chief Complaint:  F/U Cirrhosis, Constipation, Hx Colon Polyps       HPI:   [name] is a 68 y.o. female with history of colon polyps, constipation, cirrhosis, diabetes, and COPD is here for followup of MASH Cirrhosis, chronic constipation, and history of adenomatous colon polyps.  At last f/u OV 11/27/23 she was given samples of Linzess  145 mcg and 290 mcg daily to try for constipation.  Previously took Miralax .  11/27/2023 last labs: AFP negative/normal.  Elevated alk phos 163.  All other LFTs normal.  BUN 13, creatinine 0.52, glucose 120.  WBC 4.7, Hgb 11.8, MCV 92.6, platelet 145.  INR 1.4.  MELD score: 10.  12/10/2023 complete abdominal ultrasound: Cirrhotic liver.  No liver lesions.  Prior cholecystectomy.  CBD 9.1 mm, dilated, post CCY.  No ascites.  Benign right kidney cyst.  Patient fell, broke her back, and underwent back surgery 09/2023.  She is in a wheelchair and is still having a lot of back pain.  On chronic pain meds.  She postponed repeat EGD and colonoscopy until she recovered from back surgery.  She is here today with her sister who is helping with her care.    Current symptoms:   07/2019 last colonoscopy by Dr. Naval: Inadequate prep.  Mild sigmoid diverticulosis.  Hemorrhoids.  Three  2 mm to 5 mm sessile tubular adenoma polyps removed from sigmoid colon.  Sigmoid colon had very restricted lumen.  Repeat colonoscopy in a few months with 2-day prep was recommended.   02/2016 colonoscopy: Adenomatous polyps removed.   RUQ US  09/16/22: 1. Hepatic fatty infiltration 2. Cirrhotic appearance of the liver. 3. Large ascites. - Underwent Paracentesis.   08/2022 Echo:  EF 65-70%, Grade I DD, Mild AS.  Current Outpatient Medications  Medication Sig Dispense  Refill   Accu-Chek FastClix Lancets MISC USE TO CHECK BLOOD SUGAR THREE TIMES DAILY 300 each 11   albuterol  (PROVENTIL ) (2.5 MG/3ML) 0.083% nebulizer solution Take 3 mLs (2.5 mg total) by nebulization every 4 (four) hours as needed for wheezing. 75 mL 2   albuterol  (VENTOLIN  HFA) 108 (90 Base) MCG/ACT inhaler Inhale 1-2 puffs into the lungs every 6 (six) hours as needed for wheezing. 2 each 2   Alcohol  Swabs (B-D SINGLE USE SWABS REGULAR) PADS USE  TO  CLEAN  SKIN  BEFORE CHECKING BLOOD SUGAR 300 each 11   atorvastatin  (LIPITOR) 10 MG tablet Take 1 tablet (10 mg total) by mouth daily. 90 tablet 3   Blood Glucose Monitoring Suppl (ACCU-CHEK GUIDE) w/Device KIT 1 Device by Does not apply route in the morning, at noon, and at bedtime. 1 kit 2   Calcium  Carbonate-Vitamin D  (CALCIUM -D PO) Take 1 tablet by mouth daily.     Capsaicin  0.05 % CREA Apply to feet twice daily. 57 g 2   dimenhyDRINATE (DRAMAMINE) 50 MG tablet Take 50 mg by mouth every 6 (six) hours as needed.     DULoxetine  (CYMBALTA ) 60 MG capsule TAKE ONE CAPSULE BY MOUTH DAILY 90 capsule 3   empagliflozin  (JARDIANCE ) 25 MG TABS tablet Take 1 tablet (25 mg total) by mouth daily. 90 tablet 3   ferrous gluconate  (FERGON) 324 MG tablet Take 1 tablet (324 mg total) by mouth daily with breakfast. 90 tablet 3  furosemide  (LASIX ) 40 MG tablet Take 1 tablet (40 mg total) by mouth daily. Get your blood work checked 2-3 times per year on this medication. 90 tablet 1   gabapentin  (NEURONTIN ) 800 MG tablet TAKE ONE (1) TABLET BY MOUTH TWO (2) TIMES DAILY 180 tablet 3   glucose blood (ACCU-CHEK GUIDE) test strip USE  TO CHECK BLOOD SUGAR THREE TIMES DAILY 300 each 12   glucose blood (ACCU-CHEK GUIDE) test strip 3 (three) times daily     linaclotide  (LINZESS ) 145 MCG CAPS capsule Take 1 capsule (145 mcg total) by mouth daily before breakfast.     linaclotide  (LINZESS ) 290 MCG CAPS capsule Take 1 capsule (290 mcg total) by mouth daily before breakfast.      metFORMIN  (GLUCOPHAGE ) 1000 MG tablet TAKE 1 TABLET BY MOUTH TWICE DAILY WITH MEALS 180 tablet 3   methocarbamol  (ROBAXIN ) 500 MG tablet TAKE 1 TABLET BY MOUTH TWICE DAILY AS NEEDED FOR MUSCLE SPASMS 60 tablet 2   Multiple Vitamins-Minerals ([employer] SPECTRAVITE WOMENS SENIOR PO) Take 1 tablet by mouth daily.      naloxone (NARCAN) nasal spray 4 mg/0.1 mL Place 4 mg into the nose as needed.     omeprazole  (PRILOSEC) 20 MG capsule TAKE 1 CAPSULE BY MOUTH EVERY EVENING 90 capsule 1   ondansetron  (ZOFRAN ) 4 MG tablet Take 1 tablet (4 mg total) by mouth every 8 (eight) hours as needed for nausea or vomiting. 30 tablet 1   OVER THE COUNTER MEDICATION Take 1 tablet by mouth daily. Stool softener     oxyCODONE -acetaminophen  (PERCOCET) 5-325 MG tablet Take 1 tablet by mouth every 4 (four) hours as needed for severe pain (pain score 7-10). 20 tablet 0   spironolactone  (ALDACTONE ) 100 MG tablet TAKE ONE (1) TABLET BY MOUTH EVERY DAY 90 tablet 3   traMADol  (ULTRAM ) 50 MG tablet Take 50 mg by mouth every 8 (eight) hours as needed.     No current facility-administered medications for this visit.    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   Osteoporosis    in lower back per pt    Partial small bowel obstruction (HCC) 01/30/2020   Plantar fasciitis    Snoring 08/28/2017   Stroke (HCC) 15 years ago    residual left sided  weakness   Subacute maxillary sinusitis 05/19/2020   Tremors of nervous system    Uterovaginal prolapse, incomplete 06/10/2010   Qualifier: Diagnosis of  By: Gerlene NP, De[place]     Vertigo     Past Surgical History:  Procedure Laterality Date   APPENDECTOMY     BLADDER SURGERY     x2   CATARACT EXTRACTION W/PHACO Left 05/06/2017   Procedure: CATARACT EXTRACTION PHACO AND INTRAOCULAR LENS PLACEMENT (IOC) LEFT DIABETIC;  Surgeon: Mittie Gaskin, MD;  Location: [facility];  Service: Ophthalmology;  Laterality: Left;  Diabetic - oral meds   CATARACT EXTRACTION W/PHACO Right 06/03/2017   Procedure: CATARACT EXTRACTION PHACO AND INTRAOCULAR LENS PLACEMENT (IOC) RIGHT DIABETIC;  Surgeon: Mittie Gaskin, MD;  Location: [facility];  Service: Ophthalmology;  Laterality: Right;  Diabetic - oral meds   CHOLECYSTECTOMY     COLONOSCOPY  03/14/2016   FOOT SURGERY     IR PARACENTESIS  09/17/2022   KYPHOPLASTY N/A 04/07/2019   Procedure: L4 KYPHOPLASTY;  Surgeon: Kathlynn Sharper, MD;  Location: [facility] ORS;  Service: Orthopedics;  Laterality: N/A;   KYPHOPLASTY N/A 10/13/2023   Procedure: KYPHOPLASTY LUMBAR FIVE;  Surgeon: Debby Dorn MATSU, MD;  Location: [facility] OR;  Service: Neurosurgery;  Laterality: N/A;   TOTAL ABDOMINAL HYSTERECTOMY     TOTAL KNEE ARTHROPLASTY Left 09/21/2018   Procedure: TOTAL KNEE ARTHROPLASTY-LEFT NEEDS RNFA;  Surgeon: Kathlynn Sharper, MD;  Location: [facility] ORS;  Service: Orthopedics;  Laterality: Left;    Review of Systems:    All systems reviewed and negative except where noted in HPI.    Physical Exam:  There were no vitals taken for this visit. No LMP recorded. Patient has had a hysterectomy.  General: Well-nourished, well-developed in no acute distress.  Lungs: Clear to auscultation bilaterally. Non-labored. Heart: Regular rate and rhythm, no murmurs rubs or gallops.  Abdomen: Bowel sounds are normal; Abdomen is Soft; No hepatosplenomegaly, masses or  hernias;  No Abdominal Tenderness; No guarding or rebound tenderness. Neuro: Alert and oriented x 3.  Grossly intact.  Psych: Alert and cooperative, normal mood and affect.   Imaging Studies: US  Abdomen Complete Result Date: 12/10/2023 CLINICAL DATA:  Liver cirrhosis. EXAM: ABDOMEN ULTRASOUND COMPLETE COMPARISON:  September 16, 2022 FINDINGS: Gallbladder: Prior cholecystectomy. Common bile duct: Diameter: 9.1 mm, dilated, postsurgical in nature. Liver: No focal lesion. Nodular contour with increased echotexture. Portal vein is patent on color Doppler imaging with normal direction of blood flow towards the liver. IVC: No abnormality visualized. Pancreas: Visualized portion unremarkable. Spleen: Size and appearance within normal limits. Right Kidney: Length: 12.6 cm. Echogenicity within normal limits. There is a 1.9 x 1.9 x 2 cm cyst. No follow-up is recommended. Mild hydronephrosis. Left Kidney: Length: 12.6 cm. Echogenicity within normal limits. No mass or hydronephrosis visualized. Abdominal aorta: No aneurysm visualized. Other findings: None. IMPRESSION: 1. Cirrhotic liver. No focal liver lesion identified. 2. Mild right hydronephrosis. Electronically Signed   By: Craig Farr M.D.   On: 12/10/2023 14:01    Labs: CBC    Component Value Date/Time   WBC 4.7 11/27/2023 1144   RBC 4.01 11/27/2023 1144   HGB 11.8 (L) 11/27/2023 1144   HGB 13.0 02/05/2023 1553   HCT 37.2 11/27/2023 1144   HCT 38.0 02/05/2023 1553   PLT 145.0 (L) 11/27/2023 1144   PLT 140 (L) 02/05/2023 1553   MCV 92.6 11/27/2023 1144   MCV 96 02/05/2023 1553   MCV 97 10/21/2014 1320   MCH 29.6 10/08/2023 1120   MCHC 31.8 11/27/2023 1144   RDW 19.1 (H) 11/27/2023 1144   RDW 16.0 (H) 02/05/2023 1553   RDW 15.4 (H) 10/21/2014 1320   LYMPHSABS 1.3 11/27/2023 1144   LYMPHSABS 1.6 09/24/2022 1341   LYMPHSABS 3.8 (H) 10/21/2014 1320   MONOABS 0.8 11/27/2023 1144   MONOABS 1.3 (H) 10/21/2014 1320   EOSABS 0.1 11/27/2023 1144    EOSABS 0.2 09/24/2022 1341   EOSABS 0.3 10/21/2014 1320   BASOSABS 0.0 11/27/2023 1144   BASOSABS 0.1 09/24/2022 1341   BASOSABS 0.1 10/21/2014 1320  CMP     Component Value Date/Time   NA 138 11/27/2023 1144   NA 140 05/21/2023 1331   NA 137 10/21/2014 1320   K 3.8 11/27/2023 1144   K 3.7 10/21/2014 1320   CL 105 11/27/2023 1144   CL 107 10/21/2014 1320   CO2 25 11/27/2023 1144   CO2 26 10/21/2014 1320   GLUCOSE 120 (H) 11/27/2023 1144   GLUCOSE 92 10/21/2014 1320   BUN 13 11/27/2023 1144   BUN 25 05/21/2023 1331   BUN 18 10/21/2014 1320   CREATININE 0.52 11/27/2023 1144   CREATININE 0.87 06/08/2023 1507   CALCIUM  9.3 11/27/2023 1144   CALCIUM  9.0 10/21/2014 1320   PROT 7.4 11/27/2023 1144   PROT 6.9 05/21/2023 1331   ALBUMIN 3.5 11/27/2023 1144   ALBUMIN 3.6 (L) 05/21/2023 1331   AST 28 11/27/2023 1144   ALT 17 11/27/2023 1144   ALKPHOS 163 (H) 11/27/2023 1144   BILITOT 0.6 11/27/2023 1144   BILITOT 0.8 05/21/2023 1331   GFRNONAA >60 10/08/2023 1120   GFRNONAA >60 10/21/2014 1320   GFRAA >60 02/02/2020 0739   GFRAA >60 10/21/2014 1320     Assessment and Plan:   HEYDI SWANGO is a 68 y.o. y/o female returns for follow-up of:  1.  MASH cirrhosis - Complete abdominal ultrasound every 6 months to screen for hepatoma. - Low-sodium diet; low-fat diet. - Scheduling EGD to screen for esophageal varices. I discussed risks of EGD with patient to include risk of bleeding, perforation, and risk of sedation.  Patient expressed understanding and agrees to proceed with EGD. -   2.  Chronic Constipation -Continue Linzess ? - High-fiber diet - Drink 64 ounces of fluids daily.   3.  History of adenomatous colon polyps - Scheduling Colonoscopy I discussed risks of colonoscopy with patient to include risk of bleeding, colon perforation, and risk of sedation.  Patient expressed understanding and agrees to proceed with colonoscopy.    4.  History of Back Surgery  09/2023; Chronic Pain - Continue follow-up with neurosurgeon.    Ellen Console, PA-C  Follow up ***

## 2023-12-28 ENCOUNTER — Ambulatory Visit: Admitting: Physician Assistant

## 2024-01-16 ENCOUNTER — Inpatient Hospital Stay (HOSPITAL_COMMUNITY)
Admission: EM | Admit: 2024-01-16 | Discharge: 2024-01-19 | DRG: 812 | Disposition: A | Discharge status: Home Health | Attending: Family Medicine | Admitting: Family Medicine

## 2024-01-16 ENCOUNTER — Encounter (HOSPITAL_COMMUNITY): Payer: Self-pay | Admitting: *Deleted

## 2024-01-16 ENCOUNTER — Inpatient Hospital Stay (HOSPITAL_COMMUNITY)

## 2024-01-16 ENCOUNTER — Other Ambulatory Visit: Payer: Self-pay

## 2024-01-16 DIAGNOSIS — Z79899 Other long term (current) drug therapy: Secondary | ICD-10-CM

## 2024-01-16 DIAGNOSIS — K3189 Other diseases of stomach and duodenum: Secondary | ICD-10-CM | POA: Diagnosis not present

## 2024-01-16 DIAGNOSIS — F02A0 Dementia in other diseases classified elsewhere, mild, without behavioral disturbance, psychotic disturbance, mood disturbance, and anxiety: Secondary | ICD-10-CM | POA: Diagnosis present

## 2024-01-16 DIAGNOSIS — R58 Hemorrhage, not elsewhere classified: Principal | ICD-10-CM

## 2024-01-16 DIAGNOSIS — I728 Aneurysm of other specified arteries: Secondary | ICD-10-CM | POA: Diagnosis present

## 2024-01-16 DIAGNOSIS — R040 Epistaxis: Secondary | ICD-10-CM | POA: Diagnosis present

## 2024-01-16 DIAGNOSIS — Z7984 Long term (current) use of oral hypoglycemic drugs: Secondary | ICD-10-CM | POA: Diagnosis not present

## 2024-01-16 DIAGNOSIS — Z803 Family history of malignant neoplasm of breast: Secondary | ICD-10-CM

## 2024-01-16 DIAGNOSIS — Z860100 Personal history of colon polyps, unspecified: Secondary | ICD-10-CM

## 2024-01-16 DIAGNOSIS — Z96652 Presence of left artificial knee joint: Secondary | ICD-10-CM | POA: Diagnosis present

## 2024-01-16 DIAGNOSIS — M4856XA Collapsed vertebra, not elsewhere classified, lumbar region, initial encounter for fracture: Secondary | ICD-10-CM | POA: Diagnosis present

## 2024-01-16 DIAGNOSIS — E1140 Type 2 diabetes mellitus with diabetic neuropathy, unspecified: Secondary | ICD-10-CM | POA: Diagnosis present

## 2024-01-16 DIAGNOSIS — G309 Alzheimer's disease, unspecified: Secondary | ICD-10-CM | POA: Diagnosis present

## 2024-01-16 DIAGNOSIS — Z602 Problems related to living alone: Secondary | ICD-10-CM | POA: Diagnosis present

## 2024-01-16 DIAGNOSIS — E785 Hyperlipidemia, unspecified: Secondary | ICD-10-CM | POA: Diagnosis present

## 2024-01-16 DIAGNOSIS — K5909 Other constipation: Secondary | ICD-10-CM | POA: Diagnosis present

## 2024-01-16 DIAGNOSIS — Z90710 Acquired absence of both cervix and uterus: Secondary | ICD-10-CM

## 2024-01-16 DIAGNOSIS — M4855XA Collapsed vertebra, not elsewhere classified, thoracolumbar region, initial encounter for fracture: Secondary | ICD-10-CM | POA: Diagnosis present

## 2024-01-16 DIAGNOSIS — Z8673 Personal history of transient ischemic attack (TIA), and cerebral infarction without residual deficits: Secondary | ICD-10-CM

## 2024-01-16 DIAGNOSIS — K449 Diaphragmatic hernia without obstruction or gangrene: Secondary | ICD-10-CM | POA: Diagnosis present

## 2024-01-16 DIAGNOSIS — J449 Chronic obstructive pulmonary disease, unspecified: Secondary | ICD-10-CM | POA: Diagnosis present

## 2024-01-16 DIAGNOSIS — K7460 Unspecified cirrhosis of liver: Secondary | ICD-10-CM | POA: Diagnosis present

## 2024-01-16 DIAGNOSIS — Z87442 Personal history of urinary calculi: Secondary | ICD-10-CM

## 2024-01-16 DIAGNOSIS — Z789 Other specified health status: Secondary | ICD-10-CM

## 2024-01-16 DIAGNOSIS — Z8249 Family history of ischemic heart disease and other diseases of the circulatory system: Secondary | ICD-10-CM

## 2024-01-16 DIAGNOSIS — I8510 Secondary esophageal varices without bleeding: Secondary | ICD-10-CM | POA: Diagnosis present

## 2024-01-16 DIAGNOSIS — M810 Age-related osteoporosis without current pathological fracture: Secondary | ICD-10-CM | POA: Diagnosis present

## 2024-01-16 DIAGNOSIS — K7581 Nonalcoholic steatohepatitis (NASH): Secondary | ICD-10-CM | POA: Diagnosis present

## 2024-01-16 DIAGNOSIS — Z9181 History of falling: Secondary | ICD-10-CM

## 2024-01-16 DIAGNOSIS — S32050A Wedge compression fracture of fifth lumbar vertebra, initial encounter for closed fracture: Secondary | ICD-10-CM | POA: Diagnosis present

## 2024-01-16 DIAGNOSIS — F01A0 Vascular dementia, mild, without behavioral disturbance, psychotic disturbance, mood disturbance, and anxiety: Secondary | ICD-10-CM | POA: Diagnosis present

## 2024-01-16 DIAGNOSIS — I8500 Esophageal varices without bleeding: Secondary | ICD-10-CM | POA: Diagnosis not present

## 2024-01-16 DIAGNOSIS — D62 Acute posthemorrhagic anemia: Principal | ICD-10-CM | POA: Diagnosis present

## 2024-01-16 DIAGNOSIS — Z833 Family history of diabetes mellitus: Secondary | ICD-10-CM

## 2024-01-16 DIAGNOSIS — K7290 Hepatic failure, unspecified without coma: Secondary | ICD-10-CM | POA: Diagnosis present

## 2024-01-16 DIAGNOSIS — K920 Hematemesis: Secondary | ICD-10-CM | POA: Diagnosis present

## 2024-01-16 DIAGNOSIS — K766 Portal hypertension: Secondary | ICD-10-CM | POA: Diagnosis present

## 2024-01-16 DIAGNOSIS — G8929 Other chronic pain: Secondary | ICD-10-CM | POA: Diagnosis present

## 2024-01-16 DIAGNOSIS — Z7983 Long term (current) use of bisphosphonates: Secondary | ICD-10-CM

## 2024-01-16 DIAGNOSIS — I10 Essential (primary) hypertension: Secondary | ICD-10-CM | POA: Diagnosis present

## 2024-01-16 DIAGNOSIS — Z811 Family history of alcohol abuse and dependence: Secondary | ICD-10-CM

## 2024-01-16 DIAGNOSIS — Z604 Social exclusion and rejection: Secondary | ICD-10-CM | POA: Diagnosis present

## 2024-01-16 LAB — COMPREHENSIVE METABOLIC PANEL WITH GFR
ALT: 39 U/L (ref 0–44)
AST: 66 U/L — ABNORMAL HIGH (ref 15–41)
Albumin: 2.9 g/dL — ABNORMAL LOW (ref 3.5–5.0)
Alkaline Phosphatase: 94 U/L (ref 38–126)
Anion gap: 10 (ref 5–15)
BUN: 26 mg/dL — ABNORMAL HIGH (ref 8–23)
CO2: 23 mmol/L (ref 22–32)
Calcium: 8.5 mg/dL — ABNORMAL LOW (ref 8.9–10.3)
Chloride: 105 mmol/L (ref 98–111)
Creatinine, Ser: 0.71 mg/dL (ref 0.44–1.00)
GFR, Estimated: >60 mL/min (ref >60)
Glucose, Bld: 171 mg/dL — ABNORMAL HIGH (ref 70–99)
Potassium: 4.4 mmol/L (ref 3.5–5.1)
Sodium: 138 mmol/L (ref 135–145)
Total Bilirubin: 1.2 mg/dL (ref 0.0–1.2)
Total Protein: 6.3 g/dL — ABNORMAL LOW (ref 6.5–8.1)

## 2024-01-16 LAB — PREPARE RBC (CROSSMATCH)

## 2024-01-16 LAB — CBC WITH DIFFERENTIAL/PLATELET
Abs Immature Granulocytes: 0.03 10*3/uL (ref 0.00–0.07)
Basophils Absolute: 0.1 10*3/uL (ref 0.0–0.1)
Basophils Relative: 1 %
Eosinophils Absolute: 0.1 10*3/uL (ref 0.0–0.5)
Eosinophils Relative: 1 %
HCT: 24.8 % — ABNORMAL LOW (ref 36.0–46.0)
Hemoglobin: 7.7 g/dL — ABNORMAL LOW (ref 12.0–15.0)
Immature Granulocytes: 0 %
Lymphocytes Relative: 25 %
Lymphs Abs: 2.6 10*3/uL (ref 0.7–4.0)
MCH: 30.1 pg (ref 26.0–34.0)
MCHC: 31 g/dL (ref 30.0–36.0)
MCV: 96.9 fL (ref 80.0–100.0)
Monocytes Absolute: 1.3 10*3/uL — ABNORMAL HIGH (ref 0.1–1.0)
Monocytes Relative: 12 %
Neutro Abs: 6.4 10*3/uL (ref 1.7–7.7)
Neutrophils Relative %: 61 %
Platelets: 190 10*3/uL (ref 150–400)
RBC: 2.56 MIL/uL — ABNORMAL LOW (ref 3.87–5.11)
RDW: 17.5 % — ABNORMAL HIGH (ref 11.5–15.5)
WBC: 10.4 10*3/uL (ref 4.0–10.5)
nRBC: 0 % (ref 0.0–0.2)

## 2024-01-16 LAB — HEMOGLOBIN AND HEMATOCRIT, BLOOD
HCT: 22.1 % — ABNORMAL LOW (ref 36.0–46.0)
Hemoglobin: 7 g/dL — ABNORMAL LOW (ref 12.0–15.0)

## 2024-01-16 LAB — HIV ANTIBODY (ROUTINE TESTING W REFLEX): HIV Screen 4th Generation wRfx: NONREACTIVE

## 2024-01-16 LAB — PROTIME-INR
INR: 1.3 — ABNORMAL HIGH (ref 0.8–1.2)
Prothrombin Time: 17.2 s — ABNORMAL HIGH (ref 11.4–15.2)

## 2024-01-16 MED ORDER — PANTOPRAZOLE SODIUM 40 MG IV SOLR
40.0000 mg | Freq: Once | INTRAVENOUS | Status: AC
  Administered 2024-01-16: 40 mg via INTRAVENOUS
  Filled 2024-01-16: qty 10

## 2024-01-16 MED ORDER — OXYCODONE HCL 5 MG PO TABS
5.0000 mg | ORAL_TABLET | ORAL | Status: DC | PRN
  Administered 2024-01-17 – 2024-01-18 (×3): 5 mg via ORAL
  Filled 2024-01-16 (×3): qty 1

## 2024-01-16 MED ORDER — SODIUM CHLORIDE 0.9% IV SOLUTION: Freq: Once | INTRAVENOUS | Status: AC

## 2024-01-16 MED ORDER — ACETAMINOPHEN 10 MG/ML IV SOLN
1000.0000 mg | Freq: Four times a day (QID) | INTRAVENOUS | Status: DC
  Administered 2024-01-16 – 2024-01-17 (×2): 1000 mg via INTRAVENOUS
  Filled 2024-01-16 (×3): qty 100

## 2024-01-16 MED ORDER — PANTOPRAZOLE SODIUM 40 MG IV SOLR
40.0000 mg | Freq: Two times a day (BID) | INTRAVENOUS | Status: DC
  Administered 2024-01-17 – 2024-01-19 (×5): 40 mg via INTRAVENOUS
  Filled 2024-01-16 (×5): qty 10

## 2024-01-16 MED ORDER — SODIUM CHLORIDE 0.9 % IV SOLN: INTRAVENOUS | Status: AC

## 2024-01-16 MED ORDER — IOHEXOL 350 MG/ML SOLN
100.0000 mL | Freq: Once | INTRAVENOUS | Status: AC | PRN
  Administered 2024-01-16: 100 mL via INTRAVENOUS

## 2024-01-16 NOTE — Assessment & Plan Note (Addendum)
 T2DM: A1c 6.6 in 09/2023.  Hold home metformin  1000 mg twice daily and Jardiance  25 mg daily HTN: Normotensive Dementia: A&O x 4.  Understands risk benefits of care HLD: Hold home atorvastatin  10 mg daily Neuropathy: Hold duloxetine  60 mg daily and gabapentin  800 mg twice daily

## 2024-01-16 NOTE — Assessment & Plan Note (Signed)
 Followed by neurosurgery outpatient for management.  Generalized weakness upon exam and with history of falls, would benefit from PT/OT mobilization once acute bleed has been better managed.  May need SNF placement. -PT/OT -Fall precautions

## 2024-01-16 NOTE — ED Triage Notes (Signed)
 The pt has liver failure  worse for the last 3-4 days  she was just discharged from the hospital in [place] yesterday and according to family  the pt lives alone and she has  been bleeding from her nose had a cautery tomher nose and other areas of her body

## 2024-01-16 NOTE — Assessment & Plan Note (Signed)
 Worsening abdominal distention likely secondary to ascites.  Given diffuse abdominal pain will need diagnostic paracentesis to r/o SBP. -Diagnostic paracentesis, consider starting CTX after obtained -Hold home furosemide  and spironolactone  in the setting of acute blood loss as above

## 2024-01-16 NOTE — Plan of Care (Signed)
 FMTS Brief Progress Note  S: Went to evaluate patient at bedside.  She reports symptoms of her chronic pain, as well as right abdominal pain.  No difficulty breathing.  O: BP 120/69 (BP Location: Left Arm)   Pulse (!) 118   Temp 98.1 F (36.7 C) (Oral)   Resp (!) 24   Ht 5' 4 (1.626 m)   Wt 88.4 kg   SpO2 97%   BMI 33.45 kg/m    General: NAD, chronically ill-appearing, resting in bed HEENT: Normocephalic, atraumatic head.  Dried blood in nares bilaterally (s/p cautery yesterday) Cardio: RRR, 2/6 systolic murmur (previously documented aortic stenosis). Respiratory: CTAB, normal wob on RA Skin: Warm and dry  A/P: GI Bleed  Decompensated Liver diease - Plan per H&P from day team - Will add Oxycodone  5 mg q4h PRN (which she takes at home) for her pain - CT angio GI bleed: Per read: No evidence of active GI bleed, distal esophageal varices present, 1 cm splenic artery aneurysm which is stable. -Pending GI recommendations - Orders reviewed. Labs for AM ordered, which was adjusted as needed.  - If condition changes, plan includes page Family Medicine Teaching Service.   Howell Lunger, DO 01/16/2024, 7:27 PM PGY-2, [facility] Night Resident  Please page [phone] with questions.

## 2024-01-16 NOTE — ED Notes (Signed)
 NT to obtain orthostatic vital signs.

## 2024-01-16 NOTE — ED Notes (Signed)
 Awaiting patient from lobby.

## 2024-01-16 NOTE — ED Notes (Signed)
Got patient on the monitor did EKG shown to er provider

## 2024-01-16 NOTE — Progress Notes (Signed)
 Received consult on behalf of patient for second IV access. Pt only ordered IV NS at this time and confirmed working IV with primary RN. Per documentation, pt has stable VS and no other orders at this time. 2nd access not indicated at this time.

## 2024-01-16 NOTE — Assessment & Plan Note (Addendum)
 Precipitous drop in hemoglobin from baseline ~12 to 9.2 yesterday and now 7.7 upon admission.  Likely in the setting of nasal bleed which was cauterized by outside hospital, no evidence of further bleeding upon exam.  Can consider ENT eval if recurs.  Also likely secondary to GI bleed, not currently on anticoagulation likely in the setting of decompensated cirrhosis.  GI recommends CT angio and will see for endoscopy.  Unlikely variceal bleed giving presentation, will defer octreotide and antibiotic management at this time. -Admitted to FMTS, Dr. Rosalynn attending -GI consult -CT angiogram -NPO pending likely endoscopy -IV Protonix  40mg  BID -Transfuse pRBC x2 -H&H q6h -Consider ENT consult if recurrence of nasal bleeding

## 2024-01-16 NOTE — ED Provider Notes (Signed)
  EMERGENCY DEPARTMENT AT [facility] Provider Note   CSN: [id number] Arrival date & time: 01/16/24  1517     Patient presents with: liver failure multiple bleeding areas   [name] is a 68 y.o. female.   HPI Patient presents with bleeding.  Had nosebleeds and blood in her stools.  Had been seen recently at [facility] and reportedly had nose cauterized.  Has had more bleeding.  Now feeling more lightheaded and dizziness.  History of cirrhosis.  Sees [facility].   Prior to Admission medications   Medication Sig Start Date End Date Taking? Authorizing Provider  alendronate  (FOSAMAX ) 70 MG tablet Take 70 mg by mouth once a week. 09/29/16  Yes [provider]  Accu-Chek FastClix Lancets MISC USE TO CHECK BLOOD SUGAR THREE TIMES DAILY 12/20/21   Macario Dorothyann HERO, MD  albuterol  (PROVENTIL ) (2.5 MG/3ML) 0.083% nebulizer solution Take 3 mLs (2.5 mg total) by nebulization every 4 (four) hours as needed for wheezing. 01/09/23   Macario Dorothyann HERO, MD  albuterol  (VENTOLIN  HFA) 108 (90 Base) MCG/ACT inhaler Inhale 1-2 puffs into the lungs every 6 (six) hours as needed for wheezing. 01/09/23   Macario Dorothyann HERO, MD  Alcohol  Swabs (B-D SINGLE USE SWABS REGULAR) PADS USE  TO  CLEAN  SKIN  BEFORE CHECKING BLOOD SUGAR 09/21/19   Mullis, Kiersten P, DO  aspirin -acetaminophen -caffeine (EXCEDRIN MIGRAINE) 250-250-65 MG tablet Take 1 tablet by mouth every 6 (six) hours as needed for migraine or headache.    [provider]  atorvastatin  (LIPITOR) 10 MG tablet Take 1 tablet (10 mg total) by mouth daily. 10/14/23   Howell Lunger, DO  Blood Glucose Monitoring Suppl (ACCU-CHEK GUIDE) w/Device KIT 1 Device by Does not apply route in the morning, at noon, and at bedtime. 09/28/19   Mullis, Kiersten P, DO  Calcium  Carbonate-Vitamin D  (CALCIUM -D PO) Take 1 tablet by mouth daily.    [provider]  Capsaicin  0.05 % CREA Apply to feet twice daily. 11/30/20   Gaynel Delon LITTIE, DPM  dimenhyDRINATE (DRAMAMINE) 50 MG tablet Take 50 mg by mouth every 6 (six) hours as needed.    [provider]  DULoxetine  (CYMBALTA ) 60 MG capsule TAKE ONE CAPSULE BY MOUTH DAILY 10/14/23   Howell Lunger, DO  empagliflozin  (JARDIANCE ) 25 MG TABS tablet Take 1 tablet (25 mg total) by mouth daily. 11/21/22   Macario Dorothyann HERO, MD  ferrous gluconate  [facility]) 324 MG tablet Take 1 tablet (324 mg total) by mouth daily with breakfast. 10/21/22   Macario Dorothyann HERO, MD  furosemide  (LASIX ) 40 MG tablet Take 1 tablet (40 mg total) by mouth daily. Get your blood work checked 2-3 times per year on this medication. 06/23/23   Howell Lunger, DO  gabapentin  (NEURONTIN ) 800 MG tablet TAKE ONE (1) TABLET BY MOUTH TWO (2) TIMES DAILY 10/14/23   Howell Lunger, DO  glucose blood (ACCU-CHEK GUIDE) test strip USE  TO CHECK BLOOD SUGAR THREE TIMES DAILY 10/13/19   Mullis, Kiersten P, DO  glucose blood (ACCU-CHEK GUIDE) test strip 3 (three) times daily 12/02/19   [provider]  linaclotide  (LINZESS ) 145 MCG CAPS capsule Take 1 capsule (145 mcg total) by mouth daily before breakfast. 11/27/23 11/21/24  [place], PA-C  linaclotide  (LINZESS ) 290 MCG CAPS capsule Take 1 capsule (290 mcg total) by mouth daily before breakfast. 11/27/23 02/25/24  [place], PA-C  metFORMIN  (GLUCOPHAGE ) 1000 MG tablet TAKE 1 TABLET BY MOUTH TWICE DAILY WITH MEALS  10/14/23   Howell Lunger, DO  methocarbamol  (ROBAXIN ) 500 MG tablet TAKE 1 TABLET BY MOUTH TWICE DAILY AS NEEDED FOR MUSCLE SPASMS 10/12/23   Howell Lunger, DO  Multiple Vitamins-Minerals ([employer] SPECTRAVITE WOMENS SENIOR PO) Take 1 tablet by mouth daily.     [provider]  naloxone [facility]) nasal spray 4 mg/0.1 mL Place 4 mg into the nose as needed. 04/04/22   [provider]  omeprazole  (PRILOSEC) 20 MG capsule TAKE 1 CAPSULE BY MOUTH EVERY EVENING 11/28/23   Howell Lunger, DO  ondansetron  (ZOFRAN ) 4 MG tablet Take 1 tablet (4 mg  total) by mouth every 8 (eight) hours as needed for nausea or vomiting. 06/23/23   Howell Lunger, DO  OVER THE COUNTER MEDICATION Take 1 tablet by mouth daily. Stool softener    [provider]  oxyCODONE -acetaminophen  (PERCOCET) 5-325 MG tablet Take 1 tablet by mouth every 4 (four) hours as needed for severe pain (pain score 7-10). 10/13/23 10/12/24  Tomlinson, Sara Caylin, PA-C  spironolactone  (ALDACTONE ) 100 MG tablet TAKE ONE (1) TABLET BY MOUTH EVERY DAY 06/22/23   Howell Lunger, DO  traMADol  (ULTRAM ) 50 MG tablet Take 50 mg by mouth every 8 (eight) hours as needed. 07/19/23   [provider]    Allergies: Pregabalin  and Hydrocodone -acetaminophen     Review of Systems  Updated Vital Signs BP 120/69 (BP Location: Left Arm)   Pulse (!) 118   Temp 98.8 F (37.1 C)   Resp (!) 24   Ht 5' 4 (1.626 m)   Wt 90.3 kg   SpO2 97%   BMI 34.17 kg/m   Physical Exam Vitals and nursing note reviewed.  HENT:     Nose:     Comments: Dried blood in nares.  Dried blood in posterior pharynx.  Cardiovascular:     Rate and Rhythm: Tachycardia present.  Pulmonary:     Breath sounds: No wheezing.  Abdominal:     Tenderness: There is abdominal tenderness.     Comments: Some diffuse tenderness.  No rebound or guarding.  Genitourinary:    Comments: Red to purple blood on rectal exam.  Does have external hemorrhoids.  Neurological:     Mental Status: She is alert.     (all labs ordered are listed, but only abnormal results are displayed) Labs Reviewed  COMPREHENSIVE METABOLIC PANEL WITH GFR - Abnormal; Notable for the following components:      Result Value   Glucose, Bld 171 (*)    BUN 26 (*)    Calcium  8.5 (*)    Total Protein 6.3 (*)    Albumin 2.9 (*)    AST 66 (*)    All other components within normal limits  CBC WITH DIFFERENTIAL/PLATELET - Abnormal; Notable for the following components:   RBC 2.56 (*)    Hemoglobin 7.7 (*)    HCT 24.8 (*)    RDW 17.5 (*)     Monocytes Absolute 1.3 (*)    All other components within normal limits  PROTIME-INR - Abnormal; Notable for the following components:   Prothrombin Time 17.2 (*)    INR 1.3 (*)    All other components within normal limits  POC OCCULT BLOOD, ED  TYPE AND SCREEN    EKG: None  Radiology: No results found.   Procedures   Medications Ordered in the ED  0.9 %  sodium chloride  infusion ( Intravenous New Bag/Given 01/16/24 1653)  pantoprazole  (PROTONIX ) injection 40 mg (40 mg Intravenous Given 01/16/24 1805)  Medical Decision Making Amount and/or Complexity of Data Reviewed Labs: ordered.  Risk Prescription drug management.   Patient with nosebleeds and blood in stool.  History of cirrhosis.  Has been scoped from below but not from above.  Unknown if has varices.  Differential diagnose includes nosebleeds and GI bleeds.  Feeling lightheaded.  Hemoglobin is gone from 9.2 down to 7.7 since yesterday.  Has purple to red blood on rectal exam.  Either lower GI bleed or potentially brisk nosebleed or upper GI bleed transversing down.  Discussed with Dr. Leigh from [facility].  Will give Protonix .  Will get CT angiogram.  Will be admitted to family medicine.  Transfuse for hemoglobin less than 7.  Also discussed with Dr. Theophilus from family practice.  CRITICAL CARE Performed by: [place] Total critical care time: 30 minutes Critical care time was exclusive of separately billable procedures and treating other patients. Critical care was necessary to treat or prevent imminent or life-threatening deterioration. Critical care was time spent personally by me on the following activities: development of treatment plan with patient and/or surrogate as well as nursing, discussions with consultants, evaluation of patient's response to treatment, examination of patient, obtaining history from patient or surrogate, ordering and performing  treatments and interventions, ordering and review of laboratory studies, ordering and review of radiographic studies, pulse oximetry and re-evaluation of patient's condition.      Final diagnoses:  Bleeding  Acute blood loss anemia    ED Discharge Orders     None          River Rankin, MD 01/16/24 [phone]

## 2024-01-16 NOTE — H&P (Cosign Needed Addendum)
 Hospital Admission History and Physical Service Pager: [phone]  Patient name: [name] Medical record number: [id number] Date of Birth: [birth year] Age: 68 y.o. Gender: female  Primary Care Provider: Howell Lunger, DO Consultants: GI Code Status: Full code Preferred Emergency Contact:  Contact Information     Name Relation Home Work Mobile   Ashley,Emily Daughter   [phone]      Other Contacts   None on File      Chief Complaint: Rectal bleeding  Assessment and Plan: [name] is a 68 y.o. female with PMHx cirrhosis with ascites, T2DM, COPD, HTN, GERD, CVA, osteoporosis and mild dementia presenting with rectal bleeding and now resolved nasal bleeding.   Differential for presentation of this includes: Decompensated cirrhosis: Active bleed likely secondary to abnormal liver function although platelets 190 and INR mildly elevated at 1.3. Assessment & Plan Acute blood loss anemia Precipitous drop in hemoglobin from baseline ~12 to 9.2 yesterday and now 7.7 upon admission.  Likely in the setting of nasal bleed which was cauterized by outside hospital, no evidence of further bleeding upon exam.  Can consider ENT eval if recurs.  Also likely secondary to GI bleed, not currently on anticoagulation likely in the setting of decompensated cirrhosis.  GI recommends CT angio and will see for endoscopy.  Unlikely variceal bleed giving presentation, will defer octreotide and antibiotic management at this time. -Admitted to FMTS, Dr. Rosalynn attending -GI consult -CT angiogram -NPO pending likely endoscopy -IV Protonix  40mg  BID -Transfuse pRBC x2 -H&H q6h -Consider ENT consult if recurrence of nasal bleeding Decompensated liver disease (HCC) Worsening abdominal distention likely secondary to ascites.  Given diffuse abdominal pain will need diagnostic paracentesis to r/o SBP. -Diagnostic paracentesis, consider starting CTX after obtained -Hold home furosemide  and  spironolactone  in the setting of acute blood loss as above Compression fracture of fifth lumbar vertebra (HCC) Followed by neurosurgery outpatient for management.  Generalized weakness upon exam and with history of falls, would benefit from PT/OT mobilization once acute bleed has been better managed.  May need SNF placement. -PT/OT -Fall precautions Chronic health problem T2DM: A1c 6.6 in 09/2023.  Hold home metformin  1000 mg twice daily and Jardiance  25 mg daily HTN: Normotensive Dementia: A&O x 4.  Understands risk benefits of care HLD: Hold home atorvastatin  10 mg daily Neuropathy: Hold duloxetine  60 mg daily and gabapentin  800 mg twice daily  FEN/GI: NPO VTE Prophylaxis: SCDs  Disposition: Progressive  History of Present Illness:  [name] is a 68 y.o. female presenting with rectal bleeding.  Presents with daughter and granddaughter who provide additional history.  Reports patient has had bleeding from her nose and rectum x 3 days.  Daughter reports the blood was so copious she was swallowing it which was making her vomit the blood back up.  Reports she was seen at hospital in [facility] yesterday due to active nasal bleed and she had cautery applied with benefit.  Reports she was discharged and did not have evaluation for her rectal bleeding.  Daughter reports she continued to have bleeding from her rectum therefore they brought her back to the hospital.  Patient currently reporting headache, chest pain, shortness of breath and diffuse abdominal pain worse in the right lower quadrant.  Feels weak, dizzy and unable to stand.  Denies any further vomiting or spitting up of blood.  Daughter reports she does live at home alone due to limitations in her housing.  Reports she is weak at baseline and  has had falls.  Does have a history of paracentesis in the past but not recently.  Daughter reports her abdomen has tripled in size in the past 6 weeks.  In the ED, precipitous drop in hemoglobin  from 9.2 yesterday to 7.7 in the ED.  Tachycardic and tachypneic with stable blood pressure.  FOBT obtained and pending.  GI consulted recommended CT angiogram, IV Protonix  and transfusion as needed.  Review Of Systems: Per HPI  Pertinent Past Medical History: Liver cirrhosis with chronic ascites COPD T2DM GERD HTN Mixed Alzheimer's and vascular dementia Osteoporosis CVA Remainder reviewed in history tab.   Pertinent Past Surgical History: Appendectomy Bladder surgery Cholecystectomy Hysterectomy Total knee replacement Remainder reviewed in history tab.   Pertinent Social History: Tobacco use: Never Alcohol  use: None Other Substance use: None Lives alone  Pertinent Family History: Remainder reviewed in history tab.   Important Outpatient Medications: *Did not have her meds last night or this morning  Atorvastatin  10 mg daily Duloxetine  60 mg daily Jardiance  25 mg daily Iron  daily Furosemide  40 mg daily Gabapentin  800 mg twice daily Linzess  dose? Metformin  1000 mg twice daily Omeprazole  20 mg daily Oxycodone -acetaminophen  5-3 25 every 4 hours as needed Spironolactone  100 mg daily Tramadol  50 mg every 8 hour Remainder reviewed in medication history.   Objective: BP 120/69 (BP Location: Left Arm)   Pulse (!) 118   Temp 98.8 F (37.1 C)   Resp (!) 24   Ht 5' 4 (1.626 m)   Wt 90.3 kg   SpO2 97%   BMI 34.17 kg/m  Exam: General: Appears older than stated age, interactive in conversation.  NAD Eyes: PERRLA, anicteric sclera ENTM: Scab on outer edge of left nare, no active bleeding from naris noted.  Mild tenderness over maxillary sinuses bilaterally.  Nonerythematous oropharynx and no blood noted at the posterior oropharynx. Neck: Supple, non-tender Cardiovascular: Tachycardia.  Regular rhythm.  Systolic murmur Respiratory: CTAB. Normal WOB on RA Gastrointestinal: Obese abdomen, TTP to mild palpation diffusely.  Worse in RLQ.  Most NBS MSK: Bruising with  healed scab on left shin. Derm: Warm, dry, no rashes noted Neuro: 5 -/5 strength in upper and lower extremities, symmetric. Psych: Mildly demented but appropriately interactive in conversation understanding of risk benefits discussion.  Labs:  CBC BMET  Recent Labs  Lab 01/16/24 1634  WBC 10.4  HGB 7.7*  HCT 24.8*  PLT 190   Recent Labs  Lab 01/16/24 1634  NA 138  K 4.4  CL 105  CO2 23  BUN 26*  CREATININE 0.71  GLUCOSE 171*  CALCIUM  8.5*    Pertinent additional labs: INR: 1.3 Glucose: 171  EKG: Not obtained, ordered  Imaging Studies Performed: None upon admission   Theophilus Pagan, MD 01/16/2024, 6:25 PM PGY-2, [facility]  FPTS Intern pager: [phone], text pages welcome Secure chat group [facility] Teaching Service

## 2024-01-16 NOTE — ED Notes (Signed)
 Per Maurilio Sawyer, IV team RN will not place second IV via ultrasound per MD Pickering order.  MD Patsey notified and aware along side Engineer, manufacturing.

## 2024-01-17 ENCOUNTER — Inpatient Hospital Stay (HOSPITAL_COMMUNITY): Admitting: Certified Registered Nurse Anesthetist

## 2024-01-17 ENCOUNTER — Encounter (HOSPITAL_COMMUNITY)
Admission: EM | Disposition: A | Discharge status: Home Health | Payer: Self-pay | Source: Home / Self Care | Attending: Family Medicine

## 2024-01-17 ENCOUNTER — Encounter (HOSPITAL_COMMUNITY): Payer: Self-pay | Admitting: Family Medicine

## 2024-01-17 DIAGNOSIS — K7290 Hepatic failure, unspecified without coma: Secondary | ICD-10-CM

## 2024-01-17 DIAGNOSIS — I8500 Esophageal varices without bleeding: Secondary | ICD-10-CM

## 2024-01-17 DIAGNOSIS — K7460 Unspecified cirrhosis of liver: Secondary | ICD-10-CM | POA: Diagnosis not present

## 2024-01-17 DIAGNOSIS — J449 Chronic obstructive pulmonary disease, unspecified: Secondary | ICD-10-CM

## 2024-01-17 DIAGNOSIS — K920 Hematemesis: Secondary | ICD-10-CM

## 2024-01-17 DIAGNOSIS — D62 Acute posthemorrhagic anemia: Principal | ICD-10-CM

## 2024-01-17 DIAGNOSIS — R040 Epistaxis: Secondary | ICD-10-CM

## 2024-01-17 DIAGNOSIS — I10 Essential (primary) hypertension: Secondary | ICD-10-CM

## 2024-01-17 DIAGNOSIS — K449 Diaphragmatic hernia without obstruction or gangrene: Secondary | ICD-10-CM

## 2024-01-17 DIAGNOSIS — K766 Portal hypertension: Secondary | ICD-10-CM

## 2024-01-17 DIAGNOSIS — K3189 Other diseases of stomach and duodenum: Secondary | ICD-10-CM

## 2024-01-17 HISTORY — PX: ESOPHAGOGASTRODUODENOSCOPY: SHX5428

## 2024-01-17 LAB — BPAM RBC
Blood Product Expiration Date: 202507282359
Blood Product Expiration Date: 202507282359
ISSUE DATE / TIME: 202506282024
ISSUE DATE / TIME: 202506282356
Unit Type and Rh: 5100
Unit Type and Rh: 5100

## 2024-01-17 LAB — COMPREHENSIVE METABOLIC PANEL WITH GFR
ALT: 45 U/L — ABNORMAL HIGH (ref 0–44)
AST: 72 U/L — ABNORMAL HIGH (ref 15–41)
Albumin: 2.4 g/dL — ABNORMAL LOW (ref 3.5–5.0)
Alkaline Phosphatase: 86 U/L (ref 38–126)
Anion gap: 8 (ref 5–15)
BUN: 19 mg/dL (ref 8–23)
CO2: 22 mmol/L (ref 22–32)
Calcium: 7.9 mg/dL — ABNORMAL LOW (ref 8.9–10.3)
Chloride: 107 mmol/L (ref 98–111)
Creatinine, Ser: 0.61 mg/dL (ref 0.44–1.00)
GFR, Estimated: >60 mL/min (ref >60)
Glucose, Bld: 117 mg/dL — ABNORMAL HIGH (ref 70–99)
Potassium: 3.5 mmol/L (ref 3.5–5.1)
Sodium: 137 mmol/L (ref 135–145)
Total Bilirubin: 1.4 mg/dL — ABNORMAL HIGH (ref 0.0–1.2)
Total Protein: 5.6 g/dL — ABNORMAL LOW (ref 6.5–8.1)

## 2024-01-17 LAB — TYPE AND SCREEN
ABO/RH(D): O POS
Antibody Screen: NEGATIVE
Unit division: 0
Unit division: 0

## 2024-01-17 LAB — CBC
HCT: 25.9 % — ABNORMAL LOW (ref 36.0–46.0)
Hemoglobin: 8.4 g/dL — ABNORMAL LOW (ref 12.0–15.0)
MCH: 30.1 pg (ref 26.0–34.0)
MCHC: 32.4 g/dL (ref 30.0–36.0)
MCV: 92.8 fL (ref 80.0–100.0)
Platelets: 104 10*3/uL — ABNORMAL LOW (ref 150–400)
RBC: 2.79 MIL/uL — ABNORMAL LOW (ref 3.87–5.11)
RDW: 16.6 % — ABNORMAL HIGH (ref 11.5–15.5)
WBC: 4.9 10*3/uL (ref 4.0–10.5)
nRBC: 0 % (ref 0.0–0.2)

## 2024-01-17 LAB — GLUCOSE, CAPILLARY
Glucose-Capillary: 119 mg/dL — ABNORMAL HIGH (ref 70–99)
Glucose-Capillary: 122 mg/dL — ABNORMAL HIGH (ref 70–99)
Glucose-Capillary: 118 mg/dL — ABNORMAL HIGH (ref 70–99)

## 2024-01-17 LAB — HEMOGLOBIN AND HEMATOCRIT, BLOOD
HCT: 25 % — ABNORMAL LOW (ref 36.0–46.0)
HCT: 26.6 % — ABNORMAL LOW (ref 36.0–46.0)
Hemoglobin: 8.1 g/dL — ABNORMAL LOW (ref 12.0–15.0)
Hemoglobin: 8.7 g/dL — ABNORMAL LOW (ref 12.0–15.0)

## 2024-01-17 LAB — AMMONIA: Ammonia: 43 umol/L — ABNORMAL HIGH (ref 9–35)

## 2024-01-17 SURGERY — EGD (ESOPHAGOGASTRODUODENOSCOPY)
Anesthesia: Monitor Anesthesia Care

## 2024-01-17 MED ORDER — EMPAGLIFLOZIN 25 MG PO TABS
25.0000 mg | ORAL_TABLET | Freq: Every day | ORAL | Status: DC
  Administered 2024-01-18 – 2024-01-19 (×2): 25 mg via ORAL
  Filled 2024-01-17 (×3): qty 1

## 2024-01-17 MED ORDER — CHLORHEXIDINE GLUCONATE CLOTH 2 % EX PADS
6.0000 | MEDICATED_PAD | Freq: Every day | CUTANEOUS | Status: DC
  Administered 2024-01-17 – 2024-01-19 (×2): 6 via TOPICAL

## 2024-01-17 MED ORDER — FUROSEMIDE 40 MG PO TABS
40.0000 mg | ORAL_TABLET | Freq: Every day | ORAL | Status: DC
  Administered 2024-01-18 – 2024-01-19 (×2): 40 mg via ORAL
  Filled 2024-01-17 (×3): qty 1

## 2024-01-17 MED ORDER — PROPOFOL 500 MG/50ML IV EMUL
INTRAVENOUS | Status: DC | PRN
  Administered 2024-01-17: 125 ug/kg/min via INTRAVENOUS

## 2024-01-17 MED ORDER — SPIRONOLACTONE 25 MG PO TABS
100.0000 mg | ORAL_TABLET | Freq: Every day | ORAL | Status: DC
  Administered 2024-01-18 – 2024-01-19 (×2): 100 mg via ORAL
  Filled 2024-01-17 (×3): qty 4

## 2024-01-17 MED ORDER — GABAPENTIN 400 MG PO CAPS
400.0000 mg | ORAL_CAPSULE | Freq: Two times a day (BID) | ORAL | Status: DC
  Administered 2024-01-17 – 2024-01-19 (×4): 400 mg via ORAL
  Filled 2024-01-17 (×5): qty 1

## 2024-01-17 MED ORDER — SODIUM CHLORIDE 0.9 % IV SOLN: INTRAVENOUS | Status: DC

## 2024-01-17 MED ORDER — DULOXETINE HCL 60 MG PO CPEP
60.0000 mg | ORAL_CAPSULE | Freq: Every day | ORAL | Status: DC
  Administered 2024-01-18 – 2024-01-19 (×2): 60 mg via ORAL
  Filled 2024-01-17 (×3): qty 1

## 2024-01-17 MED ORDER — ATORVASTATIN CALCIUM 10 MG PO TABS
10.0000 mg | ORAL_TABLET | Freq: Every day | ORAL | Status: DC
  Administered 2024-01-18 – 2024-01-19 (×2): 10 mg via ORAL
  Filled 2024-01-17 (×3): qty 1

## 2024-01-17 MED ORDER — SODIUM CHLORIDE 0.9 % IV SOLN
2.0000 g | INTRAVENOUS | Status: DC
  Administered 2024-01-17 – 2024-01-18 (×2): 2 g via INTRAVENOUS
  Filled 2024-01-17 (×2): qty 20

## 2024-01-17 NOTE — Progress Notes (Signed)
 Daily Progress Note Intern Pager: [phone]  Patient name: [name] Medical record number: [id number] Date of birth: Nov 01, 1955 Age: 68 y.o. Gender: female  Primary Care Provider: Howell Lunger, DO Consultants: GI Code Status: Full  Pt Overview and Major Events to Date:  6/28 admitted, received 2 units PRBC  Assessment and Plan:  [name] is a 68 y.o. female presenting with acute blood loss anemia in the setting of possible GI bleeding. Pertinent PMH/PSH includes cirrhosis with ascites, T2DM, COPD, HTN, GERD, CVA, osteoporosis, mild dementia.  Assessment & Plan Acute blood loss anemia S/p 2 units PRBCs.  Presented with hemoglobin 7.7, down from baseline of 12.  Had significant epistaxis prior to admission and also reports rectal bleeding.  Complicated by cirrhosis.  CT angio without evidence of active GI bleed but showed cirrhosis with distal esophageal varices, stable 1 cm splenic artery aneurysm. - GI consulted, appreciate recommendations  - plan for EGD  - trial BID lactulose after EGD - Follow-up CBC, transfuse for hemoglobin less than 7 - Twice daily IV PPI - N.p.o. in case of procedure - If febrile, likely start SBP prophylaxis.  No ascites noted on imaging - Consider ENT consult if recurrence/worsening of epistaxis - AM CBC, CMP Decompensated liver disease (HCC) Diffuse abdominal pain on admission.  No ascites noted on imaging. -Consider CTX if febrile -restart home furosemide  and spironolactone   -decrease fluid rate to 75, cautious to avoid third spacing Compression fracture of fifth lumbar vertebra (HCC) Followed by neurosurgery outpatient for management.  CT angio did show some progression of this. May need SNF placement. -PT/OT -Fall precautions Chronic health problem T2DM: A1c 6.6 in 09/2023.  Hold home metformin  1000 mg twice daily and Jardiance  25 mg daily.  Consider restarting +/- SSI while inpatient Chronic pain: Home oxycodone  5 mg every 4  hours as needed HTN: Home spironolactone  Dementia: Seems to be at baseline HLD: home atorvastatin  10 mg daily Neuropathy: duloxetine  60 mg daily and gabapentin  400 mg twice daily (decreased from 800 BID due to ?encephalopathy)   FEN/GI: N.p.o. pending GI recommendations.  Received IV fluids overnight PPx: SCDs Dispo:Pending PT recommendations  pending clinical improvement .   Subjective:  NAEON, denies acute concerns this morning. Reports continued abd pain. Denies recurrent BM/melena or epistaxis. Daughter at bedside  Objective: Temp:  [97.7 F (36.5 C)-98.8 F (37.1 C)] 98.1 F (36.7 C) (06/29 0507) Pulse Rate:  [93-131] 95 (06/29 0507) Resp:  [12-32] 20 (06/29 0507) BP: (98-147)/(54-77) 113/65 (06/29 0507) SpO2:  [91 %-100 %] 96 % (06/29 0507) Weight:  [88.4 kg-90.3 kg] 89.9 kg (06/29 0507) Physical Exam: General: NAD awake and alert Cardiovascular: RRR 3/6 systolic murmur  Respiratory: CTAB normal WOB on RA Abdomen: soft, mildly tender diffusely, nondistended, no guarding Extremities: no significant edema  Laboratory: Most recent CBC Lab Results  Component Value Date   WBC 10.4 01/16/2024   HGB 8.1 (L) 01/17/2024   HCT 25.0 (L) 01/17/2024   MCV 96.9 01/16/2024   PLT 190 01/16/2024   Most recent BMP    Latest Ref Rng & Units 01/16/2024    4:34 PM  BMP  Glucose 70 - 99 mg/dL 828   BUN 8 - 23 mg/dL 26   Creatinine 9.55 - 1.00 mg/dL 9.28   Sodium 864 - 854 mmol/L 138   Potassium 3.5 - 5.1 mmol/L 4.4   Chloride 98 - 111 mmol/L 105   CO2 22 - 32 mmol/L 23   Calcium  8.9 -  10.3 mg/dL 8.5      Imaging/Diagnostic Tests:  CT angio GI bleed IMPRESSION: 1. No evidence of active GI bleed. 2. Cirrhosis with distal esophageal varices. 3. 1 cm splenic artery aneurysm, stable. 4. Multiple thoracolumbar vertebral compression deformities, new or progressive since 10/27/2022.     Electronically Signed   By: JONETTA Faes M.D.   On: 01/16/2024 19:19    Romelle Booty, MD 01/17/2024, 7:23 AM  PGY-2, [facility] FPTS Intern pager: [phone], text pages welcome Secure chat group [facility] Teaching Service

## 2024-01-17 NOTE — Hospital Course (Addendum)
 [name] is a 68 y.o.female with a history of T2DM, COPD, HTN, GERD, CVA, osteoporosis, and mild dementia who was admitted to the [facility] Medicine teaching Service at [facility] for rectal bleeding with concern for GIB. Her hospital course is detailed below:  Rectal bleeding Patient presented with a hemoglobin of 7.7 down from her baseline of 12.  Notably had significant epistaxis prior to admission which required cautery.  Continued to have a bleeding at home and then concern for hematemesis and rectal bleeding.  CTA without evidence of active GI bleed, but showed cirrhosis with distal esophageal varices and a stable 1 cm splenic artery aneurysm.  GI was consulted and was concerned that the rectal bleeding is likely secondary to swallowing blood vs upper GI cause. EGD demonstrated 5 nonbleeding esophageal varices that were banded. GI recommended outpatient colonoscopy due to low concern for active lower GI bleed.  Patient's diet was advanced appropriately after EGD and she was tolerating a regular diet by the date of discharge.***  Cirrhosis History of cirrhosis with ascites, however imaging revealed no current ascites.  Was placed on empiric ceftriaxone for SBP prophylaxis (6/29- ***). Continued home furosemide  and spironolactone  and used fluids cautiously.   Compression fracture of 5th lumbar vertebra CTA did not show any progression of compression fracture for which she managed by neurosurgery outpatient.  PT/OT recommending SNF placement.  Patient was ultimately discharged to ***on ***.  Other chronic conditions were medically managed with home medications and formulary alternatives as necessary (T2DM, chronic pain, HTN, dementia, HLD, and neuropathy)  PCP Follow-up Recommendations: Repeat banding outpatient to eradicate varices per GI. Consider ENT referral for epistaxis Follow-up about personal care services

## 2024-01-17 NOTE — Transfer of Care (Signed)
 Immediate Anesthesia Transfer of Care Note  Patient: [name]  Procedure(s) Performed: EGD (ESOPHAGOGASTRODUODENOSCOPY)  Patient Location: PACU and Endoscopy Unit  Anesthesia Type:MAC  Level of Consciousness: drowsy and responds to stimulation  Airway & Oxygen Therapy: Patient Spontanous Breathing  Post-op Assessment: Report given to RN and Post -op Vital signs reviewed and stable  Post vital signs: Reviewed and stable  Last Vitals:  Vitals Value Taken Time  BP 143/71 01/17/24 14:50  Temp 36.2 C 01/17/24 14:50  Pulse 99 01/17/24 14:52  Resp 30 01/17/24 14:52  SpO2 96 % 01/17/24 14:52  Vitals shown include unfiled device data.  Last Pain:  Vitals:   01/17/24 1450  TempSrc: Temporal  PainSc:          Complications: No notable events documented.

## 2024-01-17 NOTE — Assessment & Plan Note (Signed)
 Followed by neurosurgery outpatient for management.  CT angio did show some progression of this. May need SNF placement. -PT/OT -Fall precautions

## 2024-01-17 NOTE — Assessment & Plan Note (Addendum)
 S/p 2 units PRBCs.  Presented with hemoglobin 7.7, down from baseline of 12.  Had significant epistaxis prior to admission and also reports rectal bleeding.  Complicated by cirrhosis.  CT angio without evidence of active GI bleed but showed cirrhosis with distal esophageal varices, stable 1 cm splenic artery aneurysm. - GI consulted, appreciate recommendations  - plan for EGD  - trial BID lactulose after EGD - Follow-up CBC, transfuse for hemoglobin less than 7 - Twice daily IV PPI - N.p.o. in case of procedure - If febrile, likely start SBP prophylaxis.  No ascites noted on imaging - Consider ENT consult if recurrence/worsening of epistaxis - AM CBC, CMP

## 2024-01-17 NOTE — Evaluation (Signed)
 Occupational Therapy Evaluation Patient Details Name: [name] MRN: [id number] DOB: 21-Dec-1955 Today's Date: 01/17/2024   History of Present Illness   Pt is a 68 y.o. female presented to ED 01/16/24 for nosebleeds (cauterized at outside hospital) and blood in her stools. CT negative for GI bleed. Hgb 7.7 in ED pt received 2 unit PRBC. PMH significant for cirrhosis with ascites, T2DM, COPD, HTN, GERD, CVA, osteoporosis, and mild dementia.     Clinical Impressions Pt admitted based on above, and was seen based on problem list below. PTA pt was independent with ADLs, but reporting a history of falling. Today pt is requiring set up  to mod +2  assist for ADLs. Bed mobility was min assist and functional transfers are up to  mod +2 assist. Noted decreased STM, sequencing, and initiation skills. Pt would benefit from <3 hours of skilled rehab daily. OT will continue to follow acutely to maximize functional independence.     If plan is discharge home, recommend the following:   Two people to help with walking and/or transfers;A lot of help with bathing/dressing/bathroom;Direct supervision/assist for medications management;Direct supervision/assist for financial management;Supervision due to cognitive status     Functional Status Assessment   Patient has had a recent decline in their functional status and demonstrates the ability to make significant improvements in function in a reasonable and predictable amount of time.     Equipment Recommendations   Other (comment) (Defer to next venue)      Precautions/Restrictions   Precautions Precautions: Fall Recall of Precautions/Restrictions: Impaired Restrictions Weight Bearing Restrictions Per Provider Order: No     Mobility Bed Mobility Overal bed mobility: Needs Assistance Bed Mobility: Supine to Sit     Supine to sit: Min assist, HOB elevated, Used rails     General bed mobility comments: Min assist, heavy cues for  sequencing    Transfers Overall transfer level: Needs assistance Equipment used: Rolling walker (2 wheels) Transfers: Sit to/from Stand Sit to Stand: Mod assist, +2 physical assistance       General transfer comment: Mod +2 to come to stand with RW, once in standing cues to complete lateral side steps along EOB min to CGA      Balance Overall balance assessment: Needs assistance Sitting-balance support: No upper extremity supported, Feet supported Sitting balance-Leahy Scale: Fair     Standing balance support: During functional activity, Reliant on assistive device for balance, Bilateral upper extremity supported Standing balance-Leahy Scale: Poor Standing balance comment: Reliant on RW       ADL either performed or assessed with clinical judgement   ADL Overall ADL's : Needs assistance/impaired Eating/Feeding: Set up;Sitting   Grooming: Set up;Sitting   Upper Body Bathing: Set up;Sitting   Lower Body Bathing: Moderate assistance;+2 for physical assistance;Sit to/from stand   Upper Body Dressing : Sitting;Set up   Lower Body Dressing: Moderate assistance;+2 for physical assistance;Sit to/from stand   Toilet Transfer: Moderate assistance;+2 for physical assistance;Rolling walker (2 wheels) Toilet Transfer Details (indicate cue type and reason): Lateral steps in room Toileting- Clothing Manipulation and Hygiene: Total assistance;Sit to/from stand       Functional mobility during ADLs: Moderate assistance;+2 for physical assistance       Vision Baseline Vision/History: 0 No visual deficits Additional Comments: Noted decreased smoothness with pursuits and tracking, pt difficulty following commands for more formal testing            Pertinent Vitals/Pain Pain Assessment Pain Assessment: Faces Faces Pain Scale: Hurts a  little bit Pain Location: abdomen Pain Descriptors / Indicators: Discomfort Pain Intervention(s): Monitored during session      Extremity/Trunk Assessment Upper Extremity Assessment Upper Extremity Assessment: Defer to OT evaluation   Lower Extremity Assessment Lower Extremity Assessment: Generalized weakness   Cervical / Trunk Assessment Cervical / Trunk Assessment: Kyphotic   Communication Communication Communication: Impaired Factors Affecting Communication: Hearing impaired   Cognition Arousal: Lethargic Behavior During Therapy: Flat affect Cognition: History of cognitive impairments     OT - Cognition Comments: Pt initally difficult to arouse, once in chair position increased arousal level, unable to answer home set up questions, mostly brief yes/no, pt with hx of dementia     Following commands: Impaired Following commands impaired: Follows one step commands with increased time     Cueing  General Comments   Cueing Techniques: Verbal cues  IV came out of pt, RN notified, PT used gauze to stop inital bleeding           Home Living Family/patient expects to be discharged to:: Private residence Living Arrangements: Alone   Type of Home: Apartment Home Access: Level entry     Home Layout: One level     Bathroom Shower/Tub: Chief Strategy Officer: Standard Bathroom Accessibility: Yes   Home Equipment: Grab bars - toilet;Shower seat          Prior Functioning/Environment Prior Level of Function : Needs assist;Independent/Modified Independent             Mobility Comments: Use of RW, hx of falls ADLs Comments: Recent decline, difficulty with ADLs, and shower transfers. Daughter reports currently attempting to receive HH aid    OT Problem List: Decreased strength;Decreased range of motion;Decreased activity tolerance;Impaired balance (sitting and/or standing);Decreased cognition;Decreased safety awareness;Decreased knowledge of use of DME or AE;Cardiopulmonary status limiting activity   OT Treatment/Interventions: Self-care/ADL training;Therapeutic  exercise;Energy conservation;DME and/or AE instruction;Therapeutic activities;Patient/family education;Balance training      OT Goals(Current goals can be found in the care plan section)   Acute Rehab OT Goals Patient Stated Goal: None stated OT Goal Formulation: With patient/family Time For Goal Achievement: 01/31/24 Potential to Achieve Goals: Good   OT Frequency:  Min 2X/week    Co-evaluation PT/OT/SLP Co-Evaluation/Treatment: Yes Reason for Co-Treatment: Necessary to address cognition/behavior during functional activity;For patient/therapist safety   OT goals addressed during session: ADL's and self-care      AM-PAC OT 6 Clicks Daily Activity     Outcome Measure Help from another person eating meals?: None Help from another person taking care of personal grooming?: A Little Help from another person toileting, which includes using toliet, bedpan, or urinal?: Total Help from another person bathing (including washing, rinsing, drying)?: A Lot Help from another person to put on and taking off regular upper body clothing?: A Little Help from another person to put on and taking off regular lower body clothing?: A Lot 6 Click Score: 15   End of Session Equipment Utilized During Treatment: Gait belt;Rolling walker (2 wheels) Nurse Communication: Mobility status  Activity Tolerance: Patient tolerated treatment well Patient left: in bed;with call bell/phone within reach;with bed alarm set;with family/visitor present  OT Visit Diagnosis: Other abnormalities of gait and mobility (R26.89);Unsteadiness on feet (R26.81);Repeated falls (R29.6);Muscle weakness (generalized) (M62.81)                Time: 8863-8796 OT Time Calculation (min): 27 min Charges:  OT General Charges $OT Visit: 1 Visit OT Evaluation $OT Eval Moderate Complexity: 1 Mod  Eathel Pajak  JAYSON, OT  Acute Rehabilitation Services Office [phone] Secure chat preferred   Adrianne GORMAN Savers 01/17/2024, 3:46 PM

## 2024-01-17 NOTE — Anesthesia Preprocedure Evaluation (Signed)
 Anesthesia Evaluation  Patient identified by MRN, date of birth, ID band Patient awake    Reviewed: Allergy & Precautions, NPO status , Patient's Chart, lab work & pertinent test results  History of Anesthesia Complications Negative for: history of anesthetic complications  Airway Mallampati: III  TM Distance: >3 FB Neck ROM: Full    Dental  (+) Dental Advisory Given   Pulmonary COPD   breath sounds clear to auscultation       Cardiovascular hypertension, + Valvular Problems/Murmurs AS  Rhythm:Regular  1. Left ventricular ejection fraction, by estimation, is 65 to 70%. The  left ventricle has normal function. The left ventricle has no regional  wall motion abnormalities. Left ventricular diastolic parameters are  consistent with Grade I diastolic  dysfunction (impaired relaxation).   2. Right ventricular systolic function is normal. The right ventricular  size is normal. There is mildly elevated pulmonary artery systolic  pressure. The estimated right ventricular systolic pressure is 34.4 mmHg.   3. Left atrial size was mildly dilated.   4. Right atrial size was mildly dilated.   5. The mitral valve is normal in structure. No evidence of mitral valve  regurgitation. No evidence of mitral stenosis.   6. The aortic valve is tricuspid. There is moderate calcification of the  aortic valve. Aortic valve regurgitation is not visualized. Mild aortic  valve stenosis. Aortic valve area, by VTI measures 1.51 cm. Aortic valve  mean gradient measures 17.0 mmHg.  Aortic valve Vmax measures 2.70 m/s.   7. The inferior vena cava is normal in size with greater than 50%  respiratory variability, suggesting right atrial pressure of 3 mmHg.     Neuro/Psych  PSYCHIATRIC DISORDERS     Dementia  Neuromuscular disease CVA    GI/Hepatic Neg liver ROS,GERD  ,,? Gi bleed   Endo/Other  diabetes    Renal/GU Lab Results      Component                 Value               Date                      NA                       137                 01/17/2024                K                        3.5                 01/17/2024                CO2                      22                  01/17/2024                GLUCOSE                  117 (H)             01/17/2024                BUN  19                  01/17/2024                CREATININE               0.61                01/17/2024                CALCIUM                   7.9 (L)             01/17/2024                GFR                      96.48               11/27/2023                EGFR                     77                  05/21/2023                GFRNONAA                 >60                 01/17/2024                Musculoskeletal   Abdominal   Peds  Hematology  (+) Blood dyscrasia, anemia Lab Results      Component                Value               Date                      WBC                      4.9                 01/17/2024                HGB                      8.7 (L)             01/17/2024                HCT                      26.6 (L)            01/17/2024                MCV                      92.8                01/17/2024                PLT                      104 (L)  01/17/2024              Anesthesia Other Findings   Reproductive/Obstetrics                              Anesthesia Physical Anesthesia Plan  ASA: 3  Anesthesia Plan:    Post-op Pain Management:    Induction: Intravenous  PONV Risk Score and Plan: 2 and Treatment may vary due to age or medical condition  Airway Management Planned: Nasal Cannula, Natural Airway, Simple Face Mask and Oral ETT  Additional Equipment: None  Intra-op Plan:   Post-operative Plan: Extubation in OR  Informed Consent: I have reviewed the patients History and Physical, chart, labs and discussed the procedure including the risks, benefits  and alternatives for the proposed anesthesia with the patient or authorized representative who has indicated his/her understanding and acceptance.     Dental advisory given and Interpreter used for interview  Plan Discussed with: CRNA  Anesthesia Plan Comments:          Anesthesia Quick Evaluation

## 2024-01-17 NOTE — Evaluation (Signed)
 Physical Therapy Evaluation Patient Details Name: [name] MRN: [id number] DOB: 21-Sep-1955 Today's Date: 01/17/2024  History of Present Illness  Pt is a 68 y.o. female presented to ED 01/16/24 for nosebleeds (cauterized at outside hospital) and blood in her stools. CT negative for GI bleed. Hgb 7.7 in ED pt received 2 unit PRBC. PMH significant for cirrhosis with ascites, T2DM, COPD, HTN, GERD, CVA, osteoporosis, and mild dementia.   Clinical Impression  [name] is 68 y.o. female admitted with above HPI and diagnosis. Patient is currently limited by functional impairments below (see PT problem list). Patient lives alone in income based senior apartment and is mod ind with RW at baseline. She is currently limited by confusion, weakness, impaired balance, and impaired cognition with delayed processing. Pt was able to mobilize supine<>sit with min assist and use of bed features and Mod +2 assist for sit<>stand and small steps along EOB. Pt limited by fatigue on eval and plan for EGD today so pt returned to bed for procedure. Patient will benefit from continued skilled PT interventions to address impairments and progress independence with mobility. Patient will benefit from continued inpatient follow up therapy, <3 hours/day. Acute PT will follow and progress as able.         If plan is discharge home, recommend the following: A lot of help with walking and/or transfers;A lot of help with bathing/dressing/bathroom;Assistance with cooking/housework;Direct supervision/assist for medications management;Assist for transportation;Help with stairs or ramp for entrance;Supervision due to cognitive status   Can travel by private vehicle   No    Equipment Recommendations  (defer to next venue)  Recommendations for Other Services       Functional Status Assessment Patient has had a recent decline in their functional status and demonstrates the ability to make significant improvements in function  in a reasonable and predictable amount of time.     Precautions / Restrictions Precautions Precautions: Fall Recall of Precautions/Restrictions: Impaired Restrictions Weight Bearing Restrictions Per Provider Order: No      Mobility  Bed Mobility Overal bed mobility: Needs Assistance Bed Mobility: Supine to Sit, Sit to Supine     Supine to sit: Min assist, HOB elevated, Used rails Sit to supine: Min assist, HOB elevated, Used rails   General bed mobility comments: cues for sequencing and use of bed features, min assist for LE's and trunk    Transfers Overall transfer level: Needs assistance Equipment used: Rolling walker (2 wheels) Transfers: Sit to/from Stand Sit to Stand: Mod assist, +2 safety/equipment           General transfer comment: ceus fro initiation and technique, mod+2 to rise and guide balance with side steps along EOB    Ambulation/Gait                  Stairs            Wheelchair Mobility     Tilt Bed    Modified Rankin (Stroke Patients Only)       Balance Overall balance assessment: Needs assistance Sitting-balance support: Feet supported Sitting balance-Leahy Scale: Fair     Standing balance support: Bilateral upper extremity supported, Reliant on assistive device for balance Standing balance-Leahy Scale: Poor                               Pertinent Vitals/Pain Pain Assessment Pain Assessment: No/denies pain    Home Living Family/patient expects to be discharged  to:: Private residence Living Arrangements: Alone   Type of Home: Apartment Home Access: Level entry       Home Layout: One level Home Equipment: Grab bars - toilet;Shower seat      Prior Function                       Extremity/Trunk Assessment   Upper Extremity Assessment Upper Extremity Assessment: Defer to OT evaluation    Lower Extremity Assessment Lower Extremity Assessment: Generalized weakness    Cervical /  Trunk Assessment Cervical / Trunk Assessment: Kyphotic  Communication   Communication Communication: Impaired Factors Affecting Communication: Hearing impaired    Cognition Arousal: Alert Behavior During Therapy: Flat affect   PT - Cognitive impairments: History of cognitive impairments                       PT - Cognition Comments: dementia at baseline Following commands: Impaired Following commands impaired: Follows one step commands inconsistently, Follows one step commands with increased time     Cueing Cueing Techniques: Verbal cues, Gestural cues     General Comments      Exercises     Assessment/Plan    PT Assessment Patient needs continued PT services  PT Problem List Decreased strength;Decreased balance;Decreased activity tolerance;Decreased mobility;Decreased cognition;Decreased knowledge of use of DME;Decreased safety awareness;Decreased knowledge of precautions;Cardiopulmonary status limiting activity       PT Treatment Interventions DME instruction;Gait training;Stair training;Functional mobility training;Therapeutic activities;Therapeutic exercise;Balance training;Neuromuscular re-education;Cognitive remediation;Patient/family education;Wheelchair mobility training    PT Goals (Current goals can be found in the Care Plan section)  Acute Rehab PT Goals Patient Stated Goal: return home PT Goal Formulation: With patient/family Time For Goal Achievement: 01/31/24 Potential to Achieve Goals: Fair    Frequency Min 2X/week     Co-evaluation   Reason for Co-Treatment: Necessary to address cognition/behavior during functional activity;For patient/therapist safety   OT goals addressed during session: ADL's and self-care       AM-PAC PT 6 Clicks Mobility  Outcome Measure Help needed turning from your back to your side while in a flat bed without using bedrails?: A Little Help needed moving from lying on your back to sitting on the side of a flat  bed without using bedrails?: A Little Help needed moving to and from a bed to a chair (including a wheelchair)?: A Lot Help needed standing up from a chair using your arms (e.g., wheelchair or bedside chair)?: A Lot Help needed to walk in hospital room?: A Lot Help needed climbing 3-5 steps with a railing? : Total 6 Click Score: 13    End of Session Equipment Utilized During Treatment: Gait belt Activity Tolerance: Patient tolerated treatment well;Patient limited by fatigue Patient left: in bed;with call bell/phone within reach;with bed alarm set;with family/visitor present Nurse Communication: Mobility status PT Visit Diagnosis: Other abnormalities of gait and mobility (R26.89);Muscle weakness (generalized) (M62.81);Difficulty in walking, not elsewhere classified (R26.2);Other symptoms and signs involving the nervous system (R29.898);Unsteadiness on feet (R26.81)    Time: 8863-8796 PT Time Calculation (min) (ACUTE ONLY): 27 min   Charges:   PT Evaluation $PT Eval Moderate Complexity: 1 Mod   PT General Charges $$ ACUTE PT VISIT: 1 Visit         Vernell DONEEN KLEIN, DPT Acute Rehabilitation Services Office [phone]  01/17/24 3:45 PM

## 2024-01-17 NOTE — Assessment & Plan Note (Addendum)
 T2DM: A1c 6.6 in 09/2023.  Hold home metformin  1000 mg twice daily and Jardiance  25 mg daily.  Consider restarting +/- SSI while inpatient Chronic pain: Home oxycodone  5 mg every 4 hours as needed HTN: Home spironolactone  Dementia: Seems to be at baseline HLD: home atorvastatin  10 mg daily Neuropathy: duloxetine  60 mg daily and gabapentin  400 mg twice daily (decreased from 800 BID due to ?encephalopathy)

## 2024-01-17 NOTE — Consult Note (Addendum)
 Consultation Note   Referring Provider:  Family Medicine Teaching Service PCP: Howell Lunger, DO Primary Gastroenterologist:  Elspeth Naval, MD      Reason for Consultation:  Possible GI bleed DOA: 01/16/2024         Hospital Day: 2   ASSESSMENT    68 yo female with a history of DM2, HTN, COPD, CVA, GERD, colon polyps, constipation, MASH cirrhosis with decompensation  68 year old female with MASH cirrhosis admitted with epistaxis and +/- GI bleed with hematemesis and bright red blood per rectum Patient was just hospitalized in [facility] for epistaxis and apparently underwent cautery.  Having recurrent epistaxis as well as reported hematemesis and bright red blood per rectum ( all yesterday).  CT angio negaive for active GI bleed. She is hemodynamically stable.  Hemoglobin improved with transfusion.  Unclear if bleeding all related to epistaxis and / or upper GI bleed  Acute blood loss anemia Hemoglobin 7.7, down from 11.8 in May Hemoglobin improved to only 8.1 after 1 unit of PRBCs.  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  Doesn't appear she has a history of hepatic encephalopathy but suspicious she may now. She has mild dementia but now with some confusion / slower reaction time. Could have HE though didn't appreciate asterixis.  MELD 3.0: 12 at 01/16/2024  4:34 PM INR 1.3  Chronic constipation Takes Linzess . I   Multiple thoracolumbar vertebral compression See PMH for additional history  Active Problems:   Decompensated liver disease (HCC)   Compression fracture of fifth lumbar vertebra (HCC)   Acute blood loss anemia   Chronic health problem      PLAN:   --To exclude upper GI bleed patient will be scheduled for an EGD, likely can be done today.  I explained the risk and benefits of the procedure to the  patient's daughter who agrees to proceed.  Patient herself is mildly confused and keeps falling asleep -- Twice daily IV pantoprazole  --Will obtain ammonia level -- Monitor H&H, additional blood transfusions if needed -- After EGD will give trial of twice daily lactulose -- Empiric Rocephin in the setting of cirrhosis and GI bleed  HPI   PATIENT IS A LITTLE CONFUSED WITH SLOW MENTATION.  HISTORY MAINLY COMES FROM CHART AND DAUGHTER  Brief GI History  Patient was last seen in our office on 11/27/22. She had not undergoing the varices screening EGD / screening colonoscopy recommended by Dr. Naval at time of Nov 2024 appt because she had fallen and broken her back. At time of 11/27/22 visit her HCC screening was update. She wanted to postpone procedures until back had healed. For constipation she was given sampes of Linzess .   Interval History   She was just discharged from [facility] a couple of days ago after being hospitalized here with what sounds like major epistaxis .  Sounds like she had cautery  done.  Yesterday at home patient had recurrent epistaxis.  She was also vomiting blood in having blood coming out of her rectum . He hgb was 7.7, down from baseline ~ 11.8 in May . INR 1.3.  BUN 26, cr 0.71, AST 66 / ALT 39.  She endorses mid abdominal pain but cannot provide me any details  CT angio No evidence of active GI bleed. 2. Cirrhosis with distal esophageal varices. 3. 1 cm splenic artery aneurysm, stable. 4. Multiple thoracolumbar vertebral compression deformities, new or progressive since 10/27/2022   Labs and Imaging:  May 2025 normal AFP   Recent Labs    01/16/24 1634  PROT 6.3*  ALBUMIN 2.9*  AST 66*  ALT 39  ALKPHOS 94  BILITOT 1.2   Recent Labs    01/16/24 1634 01/16/24 1913 01/17/24 0055  WBC 10.4  --   --   HGB 7.7* 7.0* 8.1*  HCT 24.8* 22.1* 25.0*  MCV 96.9  --   --   PLT 190  --   --    Recent Labs    01/16/24 1634  NA 138  K 4.4  CL 105   CO2 23  GLUCOSE 171*  BUN 26*  CREATININE 0.71  CALCIUM  8.5*     CT ANGIO GI BLEED CLINICAL DATA:  Liver failure, bleeding  EXAM: CTA ABDOMEN AND PELVIS WITHOUT AND WITH CONTRAST  TECHNIQUE: Multidetector CT imaging of the abdomen and pelvis was performed using the standard protocol during bolus administration of intravenous contrast. Multiplanar reconstructed images and MIPs were obtained and reviewed to evaluate the vascular anatomy.  RADIATION DOSE REDUCTION: This exam was performed according to the departmental dose-optimization program which includes automated exposure control, adjustment of the mA and/or kV according to patient size and/or use of iterative reconstruction technique.  CONTRAST:  OMNIPAQUE  IOHEXOL  350 MG/ML SOLN  COMPARISON:  09/13/2022  FINDINGS: VASCULAR  Aorta: Normal caliber aorta without aneurysm, dissection, vasculitis or significant stenosis.  Celiac: Patent without evidence of aneurysm, dissection, vasculitis or significant stenosis. Classic trifurcation branch anatomy. 1 cm peripherally calcified aneurysm splenic artery aneurysm near the splenic hilum as before.  SMA: Patent without evidence of aneurysm, dissection, vasculitis or significant stenosis.  Renals: Normal single bilaterally, both with calcified ostial plaque resulting in minimal stenosis, patent distally.  IMA: Patent without evidence of aneurysm, dissection, vasculitis or significant stenosis.  Inflow: Patent without evidence of aneurysm, dissection, vasculitis or significant stenosis.  Proximal Outflow: Bilateral common femoral and visualized portions of the superficial and profunda femoral arteries are patent without evidence of aneurysm, dissection, vasculitis or significant stenosis.  Veins: Distal esophageal varices. Patent hepatic veins, portal vein, SM V, splenic vein, bilateral renal veins, iliac venous system and IVC.  Review of the MIP images  confirms the above findings.  NON-VASCULAR  Lower chest: No pleural or pericardial effusion. Linear scarring or atelectasis posteriorly in the lung bases.  Hepatobiliary: Nodular contour. Cholecystectomy clips. No focal lesion or biliary ductal dilatation.  Pancreas: Unremarkable. No pancreatic ductal dilatation or surrounding inflammatory changes.  Spleen: Borderline splenomegaly, 12.3 cm length.  No focal lesion.  Adrenals/Urinary Tract: No adrenal mass. Symmetric renal parenchymal enhancement. No urolithiasis or hydronephrosis. Urinary bladder physiologically distended.  Stomach/Bowel: Stomach is nondistended. Small bowel decompressed. Appendix not identified. The colon is partially distended, without acute finding.  Lymphatic: Subcentimeter left para-aortic and aortocaval lymph nodes. No mesenteric or pelvic adenopathy.  Reproductive: Choose 2  Other: Pelvic phleboliths.  No ascites.  No free air.  Musculoskeletal: Vertebral compression deformities of T12, L1, L2, are new or progressive since radiographs 10/27/2022. New L5 compression deformity, post cement augmentation. Stable L4 compression deformity post cement augmentation.  IMPRESSION: 1. No evidence of active GI bleed. 2.  Cirrhosis with distal esophageal varices. 3. 1 cm splenic artery aneurysm, stable. 4. Multiple thoracolumbar vertebral compression deformities, new or progressive since 10/27/2022.  Electronically Signed   By: JONETTA Faes M.D.   On: 01/16/2024 19:19    Pertinent GI Studies   Colonoscopy 07/26/19: - Hemorrhoids were found on perianal exam. - A few medium-mouthed diverticula were found in the sigmoid colon. - A moderate amount of solid stool was found in the entire colon, interfering with visualization. Prep was worst in the right colon, could not clear the cecum and right colon. - Three sessile polyps were found in the sigmoid colon. The polyps were 2 to 5 mm in size. These polyps were  removed with a cold snare. There was persistent oozing at the largest polypectomy site after several minutes of observation. Snare tip coagulation was performed to the area with good hemostasis. Resection and retrieval were complete. - Internal hemorrhoids were found during retroflexion. - The sigmoid colon had a very restricted lumen, which took some time to traverse, using mostly water immersion. The exam was otherwise without abnormality.  Surgical [P], colon, sigmoid, polyp - TUBULAR ADENOMA (X3 FRAGMENTS). - NO HIGH GRADE DYSPLASIA OR MALIGNANCY.  Colonoscopy 03/14/2016: - The perianal and digital rectal examinations were normal. - A 4 mm polyp was found in the cecum. The polyp was sessile. The polyp was removed with a cold snare. Resection and retrieval were complete. - A 4 mm polyp was found in the ascending colon. The polyp was flat. The polyp was removed with a cold biopsy forceps. Resection and retrieval were complete. - A 6 mm polyp was found in the sigmoid colon. The polyp was sessile. The polyp was removed with a cold snare. Resection and retrieval were complete. - The colon was tortuous with restricted mobility of the left colon, which led to a prolonged cecal intubation. - Non-bleeding internal hemorrhoids were found during retroflexion. - A single small angiodysplastic lesion was found in the descending colon. - The exam was otherwise without abnormality.   Surgical [P], sigmoid, cecum, ascending, polyp (3) - TUBULAR ADENOMAS. NO HIGH GRADE DYSPLASIA OR INVASIVE MALIGNANCY IDENTIFIED.      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   Osteoporosis    in lower back per pt    Partial small bowel obstruction (HCC) 01/30/2020   Plantar fasciitis    Snoring 08/28/2017   Stroke (HCC) 15 years ago    residual left sided weakness   Subacute maxillary sinusitis 05/19/2020   Tremors of nervous system    Uterovaginal prolapse, incomplete 06/10/2010   Qualifier: Diagnosis of  By: Gerlene NP, De[place]     Vertigo     Past Surgical History:  Procedure Laterality Date   APPENDECTOMY     BLADDER SURGERY     x2   CATARACT EXTRACTION W/PHACO Left 05/06/2017   Procedure: CATARACT EXTRACTION PHACO AND INTRAOCULAR LENS PLACEMENT (IOC) LEFT DIABETIC;  Surgeon: Mittie Gaskin, MD;  Location[facility];  Service: Ophthalmology;  Laterality:  Left;  Diabetic - oral meds   CATARACT EXTRACTION W/PHACO Right 06/03/2017   Procedure: CATARACT EXTRACTION PHACO AND INTRAOCULAR LENS PLACEMENT (IOC) RIGHT DIABETIC;  Surgeon: Mittie Gaskin, MD;  Location: [facility];  Service: Ophthalmology;  Laterality: Right;  Diabetic - oral meds   CHOLECYSTECTOMY     COLONOSCOPY  03/14/2016   FOOT SURGERY     IR PARACENTESIS  09/17/2022   KYPHOPLASTY N/A 04/07/2019   Procedure: L4 KYPHOPLASTY;  Surgeon: Kathlynn Sharper, MD;  Location: [facility];  Service: Orthopedics;  Laterality: N/A;   KYPHOPLASTY N/A 10/13/2023   Procedure: KYPHOPLASTY LUMBAR FIVE;  Surgeon: Debby Dorn MATSU, MD;  Location: [facility] OR;  Service: Neurosurgery;  Laterality: N/A;   TOTAL ABDOMINAL HYSTERECTOMY     TOTAL KNEE ARTHROPLASTY Left 09/21/2018   Procedure: TOTAL KNEE ARTHROPLASTY-LEFT NEEDS RNFA;  Surgeon: Kathlynn Sharper, MD;  Location: [facility];  Service: Orthopedics;  Laterality: Left;    Family History  Problem Relation Age of Onset   Healthy Mother    Heart disease Father    Alcohol  abuse Father    Heart disease Sister    Diabetes Sister    Alcohol  abuse Brother    Breast cancer Daughter    Colon  polyps Neg Hx    Colon cancer Neg Hx    Esophageal cancer Neg Hx    Rectal cancer Neg Hx    Stomach cancer Neg Hx    Pancreatic cancer Neg Hx     Prior to Admission medications   Medication Sig Start Date End Date Taking? Authorizing Provider  alendronate  (FOSAMAX ) 70 MG tablet Take 70 mg by mouth once a week. 09/29/16  Yes [provider]  Accu-Chek FastClix Lancets MISC USE TO CHECK BLOOD SUGAR THREE TIMES DAILY 12/20/21   Macario Dorothyann HERO, MD  albuterol  (PROVENTIL ) (2.5 MG/3ML) 0.083% nebulizer solution Take 3 mLs (2.5 mg total) by nebulization every 4 (four) hours as needed for wheezing. 01/09/23   Macario Dorothyann HERO, MD  albuterol  (VENTOLIN  HFA) 108 [phone] Base) MCG/ACT inhaler Inhale 1-2 puffs into the lungs every 6 (six) hours as needed for wheezing. 01/09/23   Macario Dorothyann HERO, MD  Alcohol  Swabs (B-D SINGLE USE SWABS REGULAR) PADS USE  TO  CLEAN  SKIN  BEFORE CHECKING BLOOD SUGAR 09/21/19   Mullis, Kiersten P, DO  aspirin -acetaminophen -caffeine (EXCEDRIN MIGRAINE) 250-250-65 MG tablet Take 1 tablet by mouth every 6 (six) hours as needed for migraine or headache.    [provider]  atorvastatin  (LIPITOR) 10 MG tablet Take 1 tablet (10 mg total) by mouth daily. 10/14/23   Howell Lunger, DO  Blood Glucose Monitoring Suppl (ACCU-CHEK GUIDE) w/Device KIT 1 Device by Does not apply route in the morning, at noon, and at bedtime. 09/28/19   Mullis, Kiersten P, DO  Calcium  Carbonate-Vitamin D  (CALCIUM -D PO) Take 1 tablet by mouth daily.    [provider]  Capsaicin  0.05 % CREA Apply to feet twice daily. 11/30/20   Gaynel Delon CROME, DPM  dimenhyDRINATE (DRAMAMINE) 50 MG tablet Take 50 mg by mouth every 6 (six) hours as needed.    [provider]  DULoxetine  (CYMBALTA ) 60 MG capsule TAKE ONE CAPSULE BY MOUTH DAILY 10/14/23   Howell Lunger, DO  empagliflozin  (JARDIANCE ) 25 MG TABS tablet Take 1 tablet (25 mg total) by mouth daily. 11/21/22   Macario Dorothyann HERO, MD   ferrous gluconate  [facility]) 324 MG tablet Take 1 tablet (324 mg total) by mouth daily with breakfast. 10/21/22  Macario Dorothyann HERO, MD  furosemide  (LASIX ) 40 MG tablet Take 1 tablet (40 mg total) by mouth daily. Get your blood work checked 2-3 times per year on this medication. 06/23/23   Howell Lunger, DO  gabapentin  (NEURONTIN ) 800 MG tablet TAKE ONE (1) TABLET BY MOUTH TWO (2) TIMES DAILY 10/14/23   Howell Lunger, DO  glucose blood (ACCU-CHEK GUIDE) test strip USE  TO CHECK BLOOD SUGAR THREE TIMES DAILY 10/13/19   Mullis, Kiersten P, DO  glucose blood (ACCU-CHEK GUIDE) test strip 3 (three) times daily 12/02/19   [provider]  linaclotide  (LINZESS ) 145 MCG CAPS capsule Take 1 capsule (145 mcg total) by mouth daily before breakfast. 11/27/23 11/21/24  [place], PA-C  linaclotide  (LINZESS ) 290 MCG CAPS capsule Take 1 capsule (290 mcg total) by mouth daily before breakfast. 11/27/23 02/25/24  [place], PA-C  metFORMIN  (GLUCOPHAGE ) 1000 MG tablet TAKE 1 TABLET BY MOUTH TWICE DAILY WITH MEALS 10/14/23   Howell Lunger, DO  methocarbamol  (ROBAXIN ) 500 MG tablet TAKE 1 TABLET BY MOUTH TWICE DAILY AS NEEDED FOR MUSCLE SPASMS 10/12/23   Howell Lunger, DO  Multiple Vitamins-Minerals ([employer] SPECTRAVITE WOMENS SENIOR PO) Take 1 tablet by mouth daily.     [provider]  naloxone [facility]) nasal spray 4 mg/0.1 mL Place 4 mg into the nose as needed. 04/04/22   [provider]  omeprazole  (PRILOSEC) 20 MG capsule TAKE 1 CAPSULE BY MOUTH EVERY EVENING 11/28/23   Howell Lunger, DO  ondansetron  (ZOFRAN ) 4 MG tablet Take 1 tablet (4 mg total) by mouth every 8 (eight) hours as needed for nausea or vomiting. 06/23/23   Howell Lunger, DO  OVER THE COUNTER MEDICATION Take 1 tablet by mouth daily. Stool softener    [provider]  oxyCODONE -acetaminophen  (PERCOCET) 5-325 MG tablet Take 1 tablet by mouth every 4 (four) hours as needed for severe pain (pain score 7-10). 10/13/23 10/12/24   Tomlinson, Sara Caylin, PA-C  spironolactone  (ALDACTONE ) 100 MG tablet TAKE ONE (1) TABLET BY MOUTH EVERY DAY 06/22/23   Howell Lunger, DO  traMADol  (ULTRAM ) 50 MG tablet Take 50 mg by mouth every 8 (eight) hours as needed. 07/19/23   [provider]    Current Facility-Administered Medications  Medication Dose Route Frequency Provider Last Rate Last Admin   0.9 %  sodium chloride  infusion (Manually program via Guardrails IV Fluids)   Intravenous Once Theophilus Pagan, MD   Held at 01/16/24 1837   0.9 %  sodium chloride  infusion   Intravenous Continuous Patsey Lot, MD 125 mL/hr at 01/17/24 0538 Infusion Verify at 01/17/24 0538   Chlorhexidine  Gluconate Cloth 2 % PADS 6 each  6 each Topical Daily Rosalynn Camie CROME, MD       oxyCODONE  (Oxy IR/ROXICODONE ) immediate release tablet 5 mg  5 mg Oral Q4H PRN Howell Lunger, DO       pantoprazole  (PROTONIX ) injection 40 mg  40 mg Intravenous Q12H Theophilus Pagan, MD        Allergies as of 01/16/2024 - Review Complete 01/16/2024  Allergen Reaction Noted   Pregabalin  Nausea And Vomiting 02/25/2021   Hydrocodone -acetaminophen  Nausea And Vomiting 04/29/2017    Social History   Socioeconomic History   Marital status: Single    Spouse name: Not on file   Number of children: 4   Years of education: HS   Highest education level: Not on file  Occupational History   Occupation: Unemployed  Tobacco Use   Smoking status: Never    Passive  exposure: Never   Smokeless tobacco: Never  Vaping Use   Vaping status: Never Used  Substance and Sexual Activity   Alcohol  use: No   Drug use: No   Sexual activity: Not on file  Other Topics Concern   Not on file  Social History Narrative   Lives at home alone.   Right-handed.   Rarely uses caffeine.   Social Drivers of Corporate investment banker Strain: Low Risk  (08/05/2019)   Received from [facility]   Overall Financial Resource Strain (CARDIA)  Food Insecurity: No  Food Insecurity (01/17/2024)   Hunger Vital Sign    Worried About Running Out of Food in the Last Year: Never true    Ran Out of Food in the Last Year: Never true  Transportation Needs: No Transportation Needs (01/17/2024)   PRAPARE - Administrator, Civil Service (Medical): No    Lack of Transportation (Non-Medical): No  Physical Activity: Sufficiently Active (08/05/2019)   Received from [facility]   Exercise Vital Sign    On average, how many days per week do you engage in moderate to strenuous exercise (like a brisk walk)?: 7 days    On average, how many minutes do you engage in exercise at this level?: 30 min  Stress: No Stress Concern Present (08/05/2019)   Received from [facility] Occupational Health - Occupational Stress Questionnaire    Feeling of Stress : Not at all  Social Connections: Socially Isolated (01/17/2024)   Social Connection and Isolation Panel    Frequency of Communication with Friends and Family: Three times a week    Frequency of Social Gatherings with Friends and Family: Once a week    Attends Religious Services: Never    Database administrator or Organizations: No    Attends Banker Meetings: Never    Marital Status: Widowed  Intimate Partner Violence: Not At Risk (01/17/2024)   Humiliation, Afraid, Rape, and Kick questionnaire    Fear of Current or Ex-Partner: No    Emotionally Abused: No    Physically Abused: No    Sexually Abused: No     Code Status   Code Status: Full Code  Review of Systems: All systems reviewed and negative except where noted in HPI.  Physical Exam: Vital signs in last 24 hours: Temp:  [97.7 F (36.5 C)-98.8 F (37.1 C)] 98.4 F (36.9 C) (06/29 0743) Pulse Rate:  [93-131] 93 (06/29 0743) Resp:  [12-32] 20 (06/29 0743) BP: (98-147)/(54-77) 127/59 (06/29 0743) SpO2:  [91 %-100 %] 96 % (06/29 0743) Weight:  [88.4 kg-90.3 kg] 89.9 kg (06/29  0507) Last BM Date : 01/16/24  General:  female in NAD Psych:  Sleepy, cooperative. Eyes: Pupils equal Ears:  Normal auditory acuity Nose: No deformity, discharge or lesions Neck:  Supple, no masses felt Lungs:  Clear to auscultation.  Heart:  Regular rate, regular rhythm, + murmur Abdomen:  Soft, nondistended, nontender, active bowel sounds, no masses felt Rectal :  Deferred Msk: Symmetrical without gross deformities.  Neurologic:  Sleepy, slow mentation, no asterxis Extremities : No edema Skin:  Intact without significant lesions.    Intake/Output from previous day: 06/28 0701 - 06/29 0700 In: 1580.4 [I.V.:742.9; Blood:637.5; IV Piggyback:200] Out: 1500 [Urine:1500] Intake/Output this shift:  No intake/output data recorded.   Vina Dasen, NP-C   01/17/2024, 9:24 AM

## 2024-01-17 NOTE — Op Note (Signed)
 [facility] Patient Name: [name] Procedure Date : 01/17/2024 MRN: [id number] Attending MD: Elspeth SQUIBB. Leigh , MD, [id number] Date of Birth: 30-Dec-1955 CSN: [id number] Age: 68 Admit Type: Inpatient Procedure:                Upper GI endoscopy Indications:              Hematemesis - history of severe epistaxis recently                            with rebleeding, also had associated rectal                            bleeding. CTA shows esophageal varices. EGD to                            clear upper tract and ensure no source for her                            recent bleeding. Patient of note with COPD on                            inhalers, not a good candidate for beta blockade Providers:                Elspeth SQUIBB. Leigh, MD, Almarie Pizza, RN, Felice Sar, Technician Referring MD:              Medicines:                Monitored Anesthesia Care Complications:            No immediate complications. Estimated blood loss:                            Minimal. Estimated Blood Loss:     Estimated blood loss was minimal. Procedure:                Pre-Anesthesia Assessment:                           - Prior to the procedure, a History and Physical                            was performed, and patient medications and                            allergies were reviewed. The patient's tolerance of                            previous anesthesia was also reviewed. The risks                            and benefits of the procedure and the sedation  options and risks were discussed with the patient.                            All questions were answered, and informed consent                            was obtained. Prior Anticoagulants: The patient has                            taken no anticoagulant or antiplatelet agents. ASA                            Grade Assessment: III - A patient with severe                             systemic disease. After reviewing the risks and                            benefits, the patient was deemed in satisfactory                            condition to undergo the procedure.                           After obtaining informed consent, the endoscope was                            passed under direct vision. Throughout the                            procedure, the patient's blood pressure, pulse, and                            oxygen saturations were monitored continuously. The                            GIF-H190 ([id number] Olympus endoscope was introduced                            through the mouth, and advanced to the second part                            of duodenum. The upper GI endoscopy was                            accomplished without difficulty. The patient                            tolerated the procedure well. Scope In: Scope Out: Findings:      The Z-line was regular.      A small hiatal hernia was present.      Large (> 5 mm) varices were found in the middle third of the esophagus       and in the lower third  of the esophagus. One column of large varix with       red spot and one medium sized varix. No stigmata of recent bleeding       noted. Five bands were successfully placed with eradication, resulting       in deflation of varices. 1 band on medium varix, 4 on the larger varix.      The exam of the esophagus was otherwise normal.      Mild portal hypertensive gastropathy was found in the entire examined       stomach.      The exam of the stomach was otherwise normal.      The examined duodenum was normal. Impression:               - Z-line regular.                           - Small hiatal hernia.                           - Large (> 5 mm) esophageal varices. Banded x 5                           - Portal hypertensive gastropathy.                           - Normal examined duodenum.                           I think she more than likely had her  primary bleed                            from epistaxis, however varices are large with red                            spot, not a good candidate for beta blockade given                            COPD / inhalers, I performed banding to help                            prevent future bleeding and in case related to her                            current presentation (again I think less likely she                            truly had a variceal bleed) Recommendation:           - Return patient to hospital ward for ongoing care.                           - Clear liquid diet okay today tonight                           - Continue present medications.                           -  Monitor for recurrent bleeding, trend Hgb                           - continue IV PPI                           - will need repeat banding coordinated as                            outpatient to eradicate varices                           - will follow, call with questions Procedure Code(s):        --- Professional ---                           [phone], Esophagogastroduodenoscopy, flexible,                            transoral; with band ligation of esophageal/gastric                            varices Diagnosis Code(s):        --- Professional ---                           K44.9, Diaphragmatic hernia without obstruction or                            gangrene                           I85.00, Esophageal varices without bleeding                           K76.6, Portal hypertension                           K31.89, Other diseases of stomach and duodenum                           K92.0, Hematemesis CPT copyright 2022 American Medical Association. All rights reserved. The codes documented in this report are preliminary and upon coder review may  be revised to meet current compliance requirements. [name], MD 01/17/2024 3:02:53 PM This report has been signed electronically. Number of Addenda: 0

## 2024-01-17 NOTE — Plan of Care (Signed)

## 2024-01-17 NOTE — Assessment & Plan Note (Addendum)
 Diffuse abdominal pain on admission.  No ascites noted on imaging. -Consider CTX if febrile -restart home furosemide  and spironolactone   -decrease fluid rate to 75, cautious to avoid third spacing

## 2024-01-18 DIAGNOSIS — I8510 Secondary esophageal varices without bleeding: Secondary | ICD-10-CM | POA: Diagnosis not present

## 2024-01-18 DIAGNOSIS — K7460 Unspecified cirrhosis of liver: Secondary | ICD-10-CM | POA: Diagnosis not present

## 2024-01-18 DIAGNOSIS — D62 Acute posthemorrhagic anemia: Secondary | ICD-10-CM | POA: Diagnosis not present

## 2024-01-18 DIAGNOSIS — K7290 Hepatic failure, unspecified without coma: Secondary | ICD-10-CM | POA: Diagnosis not present

## 2024-01-18 LAB — GLUCOSE, CAPILLARY: Glucose-Capillary: 126 mg/dL — ABNORMAL HIGH (ref 70–99)

## 2024-01-18 LAB — COMPREHENSIVE METABOLIC PANEL WITH GFR
ALT: 47 U/L — ABNORMAL HIGH (ref 0–44)
AST: 65 U/L — ABNORMAL HIGH (ref 15–41)
Albumin: 2.5 g/dL — ABNORMAL LOW (ref 3.5–5.0)
Alkaline Phosphatase: 86 U/L (ref 38–126)
Anion gap: 8 (ref 5–15)
BUN: 13 mg/dL (ref 8–23)
CO2: 22 mmol/L (ref 22–32)
Calcium: 8 mg/dL — ABNORMAL LOW (ref 8.9–10.3)
Chloride: 106 mmol/L (ref 98–111)
Creatinine, Ser: 0.55 mg/dL (ref 0.44–1.00)
GFR, Estimated: >60 mL/min (ref >60)
Glucose, Bld: 114 mg/dL — ABNORMAL HIGH (ref 70–99)
Potassium: 3.3 mmol/L — ABNORMAL LOW (ref 3.5–5.1)
Sodium: 136 mmol/L (ref 135–145)
Total Bilirubin: 1.1 mg/dL (ref 0.0–1.2)
Total Protein: 5.7 g/dL — ABNORMAL LOW (ref 6.5–8.1)

## 2024-01-18 LAB — CBC
HCT: 26.6 % — ABNORMAL LOW (ref 36.0–46.0)
Hemoglobin: 8.7 g/dL — ABNORMAL LOW (ref 12.0–15.0)
MCH: 30.3 pg (ref 26.0–34.0)
MCHC: 32.7 g/dL (ref 30.0–36.0)
MCV: 92.7 fL (ref 80.0–100.0)
Platelets: 112 10*3/uL — ABNORMAL LOW (ref 150–400)
RBC: 2.87 MIL/uL — ABNORMAL LOW (ref 3.87–5.11)
RDW: 16.8 % — ABNORMAL HIGH (ref 11.5–15.5)
WBC: 5.2 10*3/uL (ref 4.0–10.5)
nRBC: 0.6 % — ABNORMAL HIGH (ref 0.0–0.2)

## 2024-01-18 LAB — MAGNESIUM: Magnesium: 1.7 mg/dL (ref 1.7–2.4)

## 2024-01-18 MED ORDER — MAGNESIUM SULFATE 2 GM/50ML IV SOLN
2.0000 g | Freq: Once | INTRAVENOUS | Status: AC
  Administered 2024-01-18: 2 g via INTRAVENOUS
  Filled 2024-01-18: qty 50

## 2024-01-18 MED ORDER — ORAL CARE MOUTH RINSE: 15.0000 mL | OROMUCOSAL | Status: DC | PRN

## 2024-01-18 MED ORDER — POTASSIUM CHLORIDE CRYS ER 20 MEQ PO TBCR
40.0000 meq | EXTENDED_RELEASE_TABLET | Freq: Four times a day (QID) | ORAL | Status: AC
  Administered 2024-01-18 (×2): 40 meq via ORAL
  Filled 2024-01-18 (×2): qty 2

## 2024-01-18 NOTE — Assessment & Plan Note (Addendum)
 AM Hgb 8.1 (7.7 on admission, baseline around 12).  Remains afebrile and hemodynamically stable. Inciting event was epistaxis as well as melena and BRBPR.  Patient has known MASH cirrhosis. Found to have esophageal varices on EGD 6/29 that were banded.  GI recommends outpatient colonoscopy given likelihood of UGI due to epistaxis and known colonic polyps that, if removed, may cause additional bleeding and confound clinical picture.  --Appreciate ongoing GI recommendations  -- Continue IV PPI -- Advance to full liquid diet today, can advance to soft diet tomorrow --Continuing ceftriaxone 2 g daily for SBP prophylaxis -- Will need outpatient follow-up for varices, colonoscopy -- Transfusion threshold less than 7 -- Monitor epistaxis, consider ENT consult if worsening/recurrence -- AM CBC, CMP

## 2024-01-18 NOTE — TOC Initial Note (Addendum)
 Transition of Care [facility]) - Initial/Assessment Note    Patient Details  Name: [name] MRN: [id number] Date of Birth: April 24, 1956  [facility]) CM/SW Contact:    Luise JAYSON Pan, LCSWA Phone Number: 01/18/2024, 10:38 AM  Clinical Narrative:   Patient lives at home and daughter is available for support when possible. Patients daughter, Damien, reported she has the following DME: RW, showerchair, grab bars. Patient inquired about HH and does not want SNF at this time. CSW notified NCM. Daughter, also inquired about personal care services. CSW provided Damien with information on PCS.   10:43 AM Medicaid info not in patients chart. CSW reached out to financial counseling to confirm patient has medicaid.   TOC will continue to follow.    Expected Discharge Plan: Home w Home Health Services Barriers to Discharge: Continued Medical Work up   Patient Goals and CMS Choice Patient states their goals for this hospitalization and ongoing recovery are:: To go home          Expected Discharge Plan and Services In-house Referral: Clinical Social Work Discharge Planning Services: CM Consult   Living arrangements for the past 2 months: Single Family Home                                      Prior Living Arrangements/Services Living arrangements for the past 2 months: Single Family Home Lives with:: Self Patient language and need for interpreter reviewed:: No Do you feel safe going back to the place where you live?: Yes      Need for Family Participation in Patient Care: Yes (Comment) Care giver support system in place?: Yes (comment) Current home services:  (Rolator and broken quad cane at the home - ordering quad cane to be delivered.) Criminal Activity/Legal Involvement Pertinent to Current Situation/Hospitalization: No - Comment as needed  Activities of Daily Living   ADL Screening (condition at time of admission) Independently performs ADLs?: Yes  (appropriate for developmental age) Is the patient deaf or have difficulty hearing?: No Does the patient have difficulty seeing, even when wearing glasses/contacts?: No Does the patient have difficulty concentrating, remembering, or making decisions?: No  Permission Sought/Granted Permission sought to share information with : Case Manager, Family Supports, Other (comment)    Share Information with NAME: Damien  Permission granted to share info w AGENCY: HH  Permission granted to share info w Relationship: (Daughter)  Permission granted to share info w Contact Information: [phone]  Emotional Assessment Appearance:: Appears stated age Attitude/Demeanor/Rapport: Engaged Affect (typically observed): Pleasant Orientation: : Oriented to Self, Oriented to Place, Oriented to  Time, Oriented to Situation Alcohol  / Substance Use: Not Applicable Psych Involvement: No (comment)  Admission diagnosis:  Acute blood loss anemia [D62] Bleeding [R58] Patient Active Problem List   Diagnosis Date Noted   Esophageal varices without bleeding (HCC) 01/17/2024   Acute blood loss anemia 01/16/2024   Chronic health problem 01/16/2024   Compression fracture of fifth lumbar vertebra (HCC) 11/16/2023   Dependent on walker for ambulation 09/18/2023   Cirrhosis of liver (HCC) 02/09/2023   Decompensated liver disease (HCC) 09/16/2022   Hypervolemia 09/16/2022   COPD (chronic obstructive pulmonary disease) (HCC) 08/29/2022   Allergic rhinitis 08/29/2022   Epistaxis 08/28/2022   Hypokalemia 08/28/2022   Chronic venous insufficiency 05/15/2022   Murmur 05/15/2022   Infection of lip 06/26/2021   Polypharmacy 02/25/2021   Lump on neck 05/19/2020  Dementia (HCC) 03/12/2020   COPD with chronic bronchitis (HCC) 01/30/2020   Adenomatous polyp 10/14/2019   Delayed wound healing 09/07/2019   History of lumbar laminectomy for spinal cord decompression 08/26/2019   Lumbar radiculopathy 08/26/2019   Spinal  stenosis of lumbar region without neurogenic claudication 08/05/2019   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   Memory loss 08/10/2017   Urge incontinence 07/25/2016   Impaired functional mobility, balance, and endurance 07/24/2016   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/15/2015   DM2 (diabetes mellitus, type 2) (HCC) 04/26/2015   Benign paroxysmal positional vertigo 01/24/2015   Type 2 diabetes mellitus with diabetic neuropathy, unspecified (HCC) 04/26/2014   Vitamin D  deficiency 07/11/2013   Mood disorder (HCC) 07/11/2013   Osteopenia 11/04/2012   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 [place] [address] Phone: [phone] Fax: [phone]     Social Drivers of Health (SDOH) Social History: SDOH Screenings   Food Insecurity: No Food Insecurity (01/17/2024)  Housing: Low Risk  (01/17/2024)  Transportation Needs: No Transportation Needs (01/17/2024)  Utilities: Not At Risk (01/17/2024)  Depression (PHQ2-9): Medium Risk (09/24/2022)  Financial Resource Strain: Low Risk  (08/05/2019)   Received from [facility]  Physical Activity: Sufficiently Active (08/05/2019)   Received from [facility]  Social Connections: Socially Isolated (01/17/2024)  Stress: No Stress Concern Present (08/05/2019)   Received from [facility]  Tobacco Use: Low Risk  (01/17/2024)   SDOH Interventions:     Readmission Risk Interventions     No data to display

## 2024-01-18 NOTE — Assessment & Plan Note (Signed)
 T2DM: A1c 6.6 in 09/2023.  Hold home metformin  1000 mg twice daily and Jardiance  25 mg daily.  Consider restarting +/- SSI while inpatient Chronic pain: Home oxycodone  5 mg every 4 hours as needed HTN: Home spironolactone  Dementia: Seems to be at baseline HLD: home atorvastatin  10 mg daily Neuropathy: duloxetine  60 mg daily and gabapentin  400 mg twice daily (decreased from 800 BID due to ?encephalopathy)

## 2024-01-18 NOTE — Assessment & Plan Note (Addendum)
 Abdominal pain on admission, none currently.  No ascites on imaging.  IV fluids discontinued 6/29.  --Continue ceftriaxone prophylaxis as above (6-29- ) --Continue home Lasix  and spironolactone 

## 2024-01-18 NOTE — Progress Notes (Signed)
 Plan of care is reviewed. Pt has been progressing. She is alert and fully oriented x 4, stable hemodynamically, afebrile, NSR on the monitor, on room air, normal respiration. No acute distress noted. Her daughter, Damien stays overnight at bedside. Pt has no major complaints. No bloody or black tarry stool. Her upper left and right abdominal pain is well tolerated. Pt is able to rest well overnight. We will continue to monitor.  Wendi Dash, RN

## 2024-01-18 NOTE — Progress Notes (Signed)
 [name], female   DOB: 1955/11/05, 68 y.o.   MRN: [id number]    Progress Note   Subjective   Day # 2 CC; hematemesis/epistaxis in setting of known MASH cirrhosis  M ELD 3.0=12  EGD yesterday-large greater than 5 mm varices in the middle third of the esophagus and lower third of the esophagus, 1 column with a red spot and 1 medium sized varix, no stigmata of recent bleeding, 5 bands were placed, also noted mild portal hypertensive gastropathy in the entire stomach  Labs today-WBC 5.2/hemoglobin 8.7/hematocrit 26.6 stable, platelets 112 Potassium 3.3/BUN 13/creatinine 0.55  Patient napping, easily arousable, no specific complaints, had asked about solid food earlier.  She has no complaints of dysphagia or odynophagia.   Objective   Vital signs in last 24 hours: Temp:  [97.1 F (36.2 C)-98.2 F (36.8 C)] 98 F (36.7 C) (06/30 0739) Pulse Rate:  [78-102] 78 (06/30 0739) Resp:  [10-27] 15 (06/30 0754) BP: (115-148)/(58-72) 115/67 (06/30 0952) SpO2:  [94 %-98 %] 97 % (06/30 0739) Last BM Date : 01/16/24 General:    Older white female in NAD Heart:  Regular rate and rhythm; no murmurs Lungs: Respirations even and unlabored, lungs CTA bilaterally Abdomen:  Soft, obese, nontender and nondistended. Normal bowel sounds. Extremities:  Without edema. Neurologic:  Alert and oriented,  grossly normal neurologically. Psych:  Cooperative. Normal mood and affect.  Intake/Output from previous day: 06/29 0701 - 06/30 0700 In: 1144.7 [P.O.:250; I.V.:810.1; IV Piggyback:84.7] Out: 720 [Urine:720] Intake/Output this shift: No intake/output data recorded.  Lab Results: Recent Labs    01/16/24 1634 01/16/24 1913 01/17/24 1146 01/17/24 1303 01/18/24 0252  WBC 10.4  --  4.9  --  5.2  HGB 7.7*   < > 8.4* 8.7* 8.7*  HCT 24.8*   < > 25.9* 26.6* 26.6*  PLT 190  --  104*  --  112*   < > = values in this interval not displayed.   BMET Recent Labs    01/16/24 1634  01/17/24 1146 01/18/24 0252  NA 138 137 136  K 4.4 3.5 3.3*  CL 105 107 106  CO2 23 22 22   GLUCOSE 171* 117* 114*  BUN 26* 19 13  CREATININE 0.71 0.61 0.55  CALCIUM  8.5* 7.9* 8.0*   LFT Recent Labs    01/18/24 0252  PROT 5.7*  ALBUMIN 2.5*  AST 65*  ALT 47*  ALKPHOS 86  BILITOT 1.1   PT/INR Recent Labs    01/16/24 1634  LABPROT 17.2*  INR 1.3*    Studies/Results: CT ANGIO GI BLEED Result Date: 01/16/2024 CLINICAL DATA:  Liver failure, bleeding EXAM: CTA ABDOMEN AND PELVIS WITHOUT AND WITH CONTRAST TECHNIQUE: Multidetector CT imaging of the abdomen and pelvis was performed using the standard protocol during bolus administration of intravenous contrast. Multiplanar reconstructed images and MIPs were obtained and reviewed to evaluate the vascular anatomy. RADIATION DOSE REDUCTION: This exam was performed according to the departmental dose-optimization program which includes automated exposure control, adjustment of the mA and/or kV according to patient size and/or use of iterative reconstruction technique. CONTRAST:  OMNIPAQUE  IOHEXOL  350 MG/ML SOLN COMPARISON:  09/13/2022 FINDINGS: VASCULAR Aorta: Normal caliber aorta without aneurysm, dissection, vasculitis or significant stenosis. Celiac: Patent without evidence of aneurysm, dissection, vasculitis or significant stenosis. Classic trifurcation branch anatomy. 1 cm peripherally calcified aneurysm splenic artery aneurysm near the splenic hilum as before. SMA: Patent without evidence of aneurysm, dissection, vasculitis or significant stenosis. Renals: Normal single bilaterally,  both with calcified ostial plaque resulting in minimal stenosis, patent distally. IMA: Patent without evidence of aneurysm, dissection, vasculitis or significant stenosis. Inflow: Patent without evidence of aneurysm, dissection, vasculitis or significant stenosis. Proximal Outflow: Bilateral common femoral and visualized portions of the superficial and  profunda femoral arteries are patent without evidence of aneurysm, dissection, vasculitis or significant stenosis. Veins: Distal esophageal varices. Patent hepatic veins, portal vein, SM V, splenic vein, bilateral renal veins, iliac venous system and IVC. Review of the MIP images confirms the above findings. NON-VASCULAR Lower chest: No pleural or pericardial effusion. Linear scarring or atelectasis posteriorly in the lung bases. Hepatobiliary: Nodular contour. Cholecystectomy clips. No focal lesion or biliary ductal dilatation. Pancreas: Unremarkable. No pancreatic ductal dilatation or surrounding inflammatory changes. Spleen: Borderline splenomegaly, 12.3 cm length.  No focal lesion. Adrenals/Urinary Tract: No adrenal mass. Symmetric renal parenchymal enhancement. No urolithiasis or hydronephrosis. Urinary bladder physiologically distended. Stomach/Bowel: Stomach is nondistended. Small bowel decompressed. Appendix not identified. The colon is partially distended, without acute finding. Lymphatic: Subcentimeter left para-aortic and aortocaval lymph nodes. No mesenteric or pelvic adenopathy. Reproductive: Choose 2 Other: Pelvic phleboliths.  No ascites.  No free air. Musculoskeletal: Vertebral compression deformities of T12, L1, L2, are new or progressive since radiographs 10/27/2022. New L5 compression deformity, post cement augmentation. Stable L4 compression deformity post cement augmentation. IMPRESSION: 1. No evidence of active GI bleed. 2. Cirrhosis with distal esophageal varices. 3. 1 cm splenic artery aneurysm, stable. 4. Multiple thoracolumbar vertebral compression deformities, new or progressive since 10/27/2022. Electronically Signed   By: JONETTA Faes M.D.   On: 01/16/2024 19:19       Assessment / Plan:    #35 68 year old white female with history of NASH cirrhosis who was admitted with recurrent epistaxis and concern for possible GI bleed.  Very recent admission in [place] for epistaxis which required  cautery.  CTA on admission negative for active bleeding but mentioned varices.  EGD yesterday with finding of large esophageal varices, none with stigmata of recent bleeding.  5 bands were placed prophylactically as the varices were large and she is not a good candidate for beta-blocker therapy.  Patient has been stable overnight, hemoglobin stable, no evidence for further epistaxis.  #2 anemia acute on chronic secondary to above #3 chronic constipation #4.  Multiple thoracolumbar vertebral compression fractures  Plan; continue IV PPI today Continue full liquid diet today, should be okay to advance to soft diet tomorrow Continue to trend hemoglobin Patient will need set up for outpatient repeat EGD with banding.  I believe she has a standing appointment already scheduled in the office with Dr. Leigh for 01/29/2024       Active Problems:   Decompensated liver disease (HCC)   Compression fracture of fifth lumbar vertebra (HCC)   Acute blood loss anemia   Chronic health problem   Esophageal varices without bleeding (HCC)     LOS: 2 days   Zainah Steven PA-C 01/18/2024, 11:56 AM

## 2024-01-18 NOTE — Plan of Care (Signed)
   Problem: Education: Goal: Knowledge of General Education information will improve Description: Including pain rating scale, medication(s)/side effects and non-pharmacologic comfort measures Outcome: Progressing   Problem: Nutrition: Goal: Adequate nutrition will be maintained Outcome: Progressing   Problem: Coping: Goal: Level of anxiety will decrease Outcome: Progressing

## 2024-01-18 NOTE — Assessment & Plan Note (Addendum)
 Follows with neurosurgery outpatient.  CT angio with progression of this injury.  Patient prefers home with personal care services over SNF. -- PT/OT eval and treat -- Fall precautions -- Appreciate ongoing TOC recommendations

## 2024-01-18 NOTE — Progress Notes (Addendum)
 Daily Progress Note Intern Pager: [phone]  Patient name: [name] Medical record number: [id number] Date of birth: [birth year] Age: 68 y.o. Gender: female  Primary Care Provider: Howell Lunger, DO Consultants: GI Code Status: FULL  Pt Overview and Major Events to Date:  6/28 admitted, received 2 units PRBC  6/29: EGD,  variceal banding  Assessment and Plan: [name] is a 68 year old female here for acute blood loss anemia who presented with epistaxis, found to have esophageal varices on EGD 6/29.  Hemoglobin stable currently after varices were banded.  Patient also reported hematochezia on admission, GI recommends outpatient colonoscopy.    Pertinent PMH/PSH includes cirrhosis with ascites, T2DM, COPD, HTN, GERD, CVA, osteoporosis, mild dementia.  Assessment & Plan Esophageal varices without bleeding (HCC) Acute blood loss anemia AM Hgb 8.1 (7.7 on admission, baseline around 12).  Remains afebrile and hemodynamically stable. Inciting event was epistaxis as well as melena and BRBPR.  Patient has known MASH cirrhosis. Found to have esophageal varices on EGD 6/29 that were banded.  GI recommends outpatient colonoscopy given likelihood of UGI due to epistaxis and known colonic polyps that, if removed, may cause additional bleeding and confound clinical picture.  --Appreciate ongoing GI recommendations  -- Continue IV PPI -- Advance to full liquid diet today, can advance to soft diet tomorrow --Continuing ceftriaxone 2 g daily for SBP prophylaxis -- Will need outpatient follow-up for varices, colonoscopy -- Transfusion threshold less than 7 -- Monitor epistaxis, consider ENT consult if worsening/recurrence -- AM CBC, CMP  Decompensated liver disease (HCC) Abdominal pain on admission, none currently.  No ascites on imaging.  IV fluids discontinued 6/29.  --Continue ceftriaxone prophylaxis as above (6-29- ) --Continue home Lasix  and spironolactone  Compression fracture of  fifth lumbar vertebra (HCC) Follows with neurosurgery outpatient.  CT angio with progression of this injury.  Patient prefers home with personal care services over SNF. -- PT/OT eval and treat -- Fall precautions -- Appreciate ongoing TOC recommendations  Chronic health problem T2DM: A1c 6.6 in 09/2023.  Hold home metformin  1000 mg twice daily and Jardiance  25 mg daily.  Consider restarting +/- SSI while inpatient Chronic pain: Home oxycodone  5 mg every 4 hours as needed HTN: Home spironolactone  Dementia: Seems to be at baseline HLD: home atorvastatin  10 mg daily Neuropathy: duloxetine  60 mg daily and gabapentin  400 mg twice daily (decreased from 800 BID due to ?encephalopathy)  FEN/GI: Full liquid diet PPx: holding due to bleed, SCDs Dispo: pending clinical improvement  Subjective:  Spoke with patient and daughter at bedside.  Pt states she has not noticed any bleeding. Pain is fine today. Patient asking to eat and about d/c date. Denies CP, SOB, abd pain.  Objective: Temp:  [97.1 F (36.2 C)-98.4 F (36.9 C)] 98 F (36.7 C) (06/30 0420) Pulse Rate:  [82-102] 83 (06/30 0420) Resp:  [10-27] 20 (06/30 0420) BP: (116-148)/(58-72) 120/58 (06/30 0420) SpO2:  [94 %-98 %] 97 % (06/30 0420) Physical Exam: General: Sitting up in bed, awake and conversant, in no acute distress Cardiovascular: RRR, normal S1/S2, no M/R/G Respiratory: CTAB, normal WB on RA Abdomen: Normoactive bowel sounds, soft, nontender, nondistended Extremities: No edema to BLE, SCDs in place  Laboratory: Most recent CBC Lab Results  Component Value Date   WBC 5.2 01/18/2024   HGB 8.7 (L) 01/18/2024   HCT 26.6 (L) 01/18/2024   MCV 92.7 01/18/2024   PLT 112 (L) 01/18/2024   Most recent BMP    Latest Ref Rng &  Units 01/18/2024    2:52 AM  BMP  Glucose 70 - 99 mg/dL 885   BUN 8 - 23 mg/dL 13   Creatinine 9.55 - 1.00 mg/dL 9.44   Sodium 864 - 854 mmol/L 136   Potassium 3.5 - 5.1 mmol/L 3.3   Chloride 98 -  111 mmol/L 106   CO2 22 - 32 mmol/L 22   Calcium  8.9 - 10.3 mg/dL 8.0     Adele Song, MD 01/18/2024, 7:02 AM  PGY-1, [facility] Family Medicine FPTS Intern pager: [phone], text pages welcome Secure chat group [facility] Teaching Service

## 2024-01-19 ENCOUNTER — Encounter (HOSPITAL_COMMUNITY): Payer: Self-pay | Admitting: Gastroenterology

## 2024-01-19 ENCOUNTER — Other Ambulatory Visit (HOSPITAL_COMMUNITY): Payer: Self-pay

## 2024-01-19 ENCOUNTER — Other Ambulatory Visit: Payer: Self-pay | Admitting: Student

## 2024-01-19 DIAGNOSIS — I8510 Secondary esophageal varices without bleeding: Secondary | ICD-10-CM | POA: Diagnosis not present

## 2024-01-19 DIAGNOSIS — Z789 Other specified health status: Secondary | ICD-10-CM | POA: Diagnosis not present

## 2024-01-19 DIAGNOSIS — K7290 Hepatic failure, unspecified without coma: Secondary | ICD-10-CM | POA: Diagnosis not present

## 2024-01-19 DIAGNOSIS — D62 Acute posthemorrhagic anemia: Secondary | ICD-10-CM | POA: Diagnosis not present

## 2024-01-19 LAB — COMPREHENSIVE METABOLIC PANEL WITH GFR
ALT: 40 U/L (ref 0–44)
AST: 48 U/L — ABNORMAL HIGH (ref 15–41)
Albumin: 2.6 g/dL — ABNORMAL LOW (ref 3.5–5.0)
Alkaline Phosphatase: 90 U/L (ref 38–126)
Anion gap: 6 (ref 5–15)
BUN: 8 mg/dL (ref 8–23)
CO2: 24 mmol/L (ref 22–32)
Calcium: 8.1 mg/dL — ABNORMAL LOW (ref 8.9–10.3)
Chloride: 105 mmol/L (ref 98–111)
Creatinine, Ser: 0.57 mg/dL (ref 0.44–1.00)
GFR, Estimated: >60 mL/min (ref >60)
Glucose, Bld: 133 mg/dL — ABNORMAL HIGH (ref 70–99)
Potassium: 3.9 mmol/L (ref 3.5–5.1)
Sodium: 135 mmol/L (ref 135–145)
Total Bilirubin: 1.2 mg/dL (ref 0.0–1.2)
Total Protein: 6.1 g/dL — ABNORMAL LOW (ref 6.5–8.1)

## 2024-01-19 LAB — CBC
HCT: 28.9 % — ABNORMAL LOW (ref 36.0–46.0)
Hemoglobin: 9.3 g/dL — ABNORMAL LOW (ref 12.0–15.0)
MCH: 30.1 pg (ref 26.0–34.0)
MCHC: 32.2 g/dL (ref 30.0–36.0)
MCV: 93.5 fL (ref 80.0–100.0)
Platelets: 112 10*3/uL — ABNORMAL LOW (ref 150–400)
RBC: 3.09 MIL/uL — ABNORMAL LOW (ref 3.87–5.11)
RDW: 17.1 % — ABNORMAL HIGH (ref 11.5–15.5)
WBC: 5.6 10*3/uL (ref 4.0–10.5)
nRBC: 0.4 % — ABNORMAL HIGH (ref 0.0–0.2)

## 2024-01-19 MED ORDER — PANTOPRAZOLE SODIUM 40 MG PO TBEC
40.0000 mg | DELAYED_RELEASE_TABLET | Freq: Every day | ORAL | 0 refills | Status: DC
Start: 2024-01-19 — End: 2024-02-01
  Filled 2024-01-19: qty 30, 30d supply, fill #0

## 2024-01-19 MED ORDER — OXYCODONE HCL 5 MG PO TABS
5.0000 mg | ORAL_TABLET | ORAL | 0 refills | Status: DC | PRN
  Filled 2024-01-19: qty 15, 3d supply, fill #0

## 2024-01-19 NOTE — Care Management Important Message (Signed)
 Important Message  Patient Details  Name: [name] MRN: [id number] Date of Birth: 19-May-1956   Important Message Given:  Yes - Medicare IM     Vonzell Arrie Sharps 01/19/2024, 10:50 AM

## 2024-01-19 NOTE — Progress Notes (Signed)
 Physical Therapy Treatment Patient Details Name: [name] MRN: [id number] DOB: 15-May-1956 Today's Date: 01/19/2024   History of Present Illness 68 y.o. female adm 01/16/24 for nosebleeds (cauterized at outside hospital) and GIB. 6/29 EGD with bands placed. PMHx: cirrhosis with ascites, T2DM, COPD, HTN, GERD, CVA, osteoporosis, mild dementia.    PT Comments  Pt pleasant and reports being eager to return home. Pt able to progress to hall ambulation this session. Pt with low toilet at home and needs [facility] for return home. Pt reports daughter is arranging additional help at home and would also benefit from HHPT. Will continue to follow to maximize independence.    If plan is discharge home, recommend the following: A little help with walking and/or transfers;A little help with bathing/dressing/bathroom;Assistance with cooking/housework;Assist for transportation   Can travel by private vehicle     Yes  Equipment Recommendations  BSC/3in1    Recommendations for Other Services       Precautions / Restrictions Precautions Precautions: Fall Recall of Precautions/Restrictions: Impaired     Mobility  Bed Mobility Overal bed mobility: Modified Independent             General bed mobility comments: HOB 25 degrees, use of rail, increased time    Transfers Overall transfer level: Needs assistance   Transfers: Sit to/from Stand Sit to Stand: Supervision           General transfer comment: cues for hand placement from bed and toilet with rail and to recliner    Ambulation/Gait Ambulation/Gait assistance: Supervision Gait Distance (Feet): 250 Feet Assistive device: Rolling walker (2 wheels) Gait Pattern/deviations: Step-through pattern, Decreased stride length, Trunk flexed   Gait velocity interpretation: <1.8 ft/sec, indicate of risk for recurrent falls   General Gait Details: cues for posture and proximity to RW. Pt able to self-regulate distance   Stairs              Wheelchair Mobility     Tilt Bed    Modified Rankin (Stroke Patients Only)       Balance Overall balance assessment: Needs assistance Sitting-balance support: Feet supported Sitting balance-Leahy Scale: Fair     Standing balance support: Bilateral upper extremity supported, Reliant on assistive device for balance, During functional activity Standing balance-Leahy Scale: Poor Standing balance comment: Rw in standing                            Communication Communication Communication: No apparent difficulties  Cognition Arousal: Alert Behavior During Therapy: Flat affect   PT - Cognitive impairments: History of cognitive impairments, No family/caregiver present to determine baseline                         Following commands: Intact      Cueing Cueing Techniques: Verbal cues  Exercises      General Comments        Pertinent Vitals/Pain Pain Assessment Pain Assessment: No/denies pain    Home Living                          Prior Function            PT Goals (current goals can now be found in the care plan section) Progress towards PT goals: Progressing toward goals    Frequency    Min 2X/week      PT Plan  Co-evaluation              AM-PAC PT 6 Clicks Mobility   Outcome Measure  Help needed turning from your back to your side while in a flat bed without using bedrails?: None Help needed moving from lying on your back to sitting on the side of a flat bed without using bedrails?: A Little Help needed moving to and from a bed to a chair (including a wheelchair)?: A Little Help needed standing up from a chair using your arms (e.g., wheelchair or bedside chair)?: A Little Help needed to walk in hospital room?: A Little Help needed climbing 3-5 steps with a railing? : A Little 6 Click Score: 19    End of Session Equipment Utilized During Treatment: Gait belt Activity Tolerance: Patient tolerated  treatment well Patient left: in chair;with call bell/phone within reach;with chair alarm set Nurse Communication: Mobility status PT Visit Diagnosis: Other abnormalities of gait and mobility (R26.89);Muscle weakness (generalized) (M62.81);Difficulty in walking, not elsewhere classified (R26.2)     Time: 8786-8761 PT Time Calculation (min) (ACUTE ONLY): 25 min  Charges:    $Gait Training: 8-22 mins $Therapeutic Activity: 8-22 mins PT General Charges $$ ACUTE PT VISIT: 1 Visit                     Lenoard SQUIBB, PT Acute Rehabilitation Services Office: [phone]    Orlandis Sanden B Kolbie Clarkston 01/19/2024, 1:25 PM

## 2024-01-19 NOTE — Assessment & Plan Note (Signed)
 Hgb 9.3, uptrending from yesterday.  VSS.  No further signs of bleeding in stool.  Patient should be medically stable for discharge later today or tomorrow. - Transition IV Protonix  40 mg twice daily to p.o. Protonix  40 mg daily (per GI recs) -Stop ceftriaxone.  Low concern for SBP at this time. -Planning for outpatient colonoscopy - GI consulted, appreciate recs. -- Transfusion threshold less than 7 -- Monitor epistaxis, consider ENT consult if worsening/recurrence

## 2024-01-19 NOTE — Plan of Care (Signed)
   Problem: Education: Goal: Knowledge of General Education information will improve Description: Including pain rating scale, medication(s)/side effects and non-pharmacologic comfort measures Outcome: Progressing   Problem: Health Behavior/Discharge Planning: Goal: Ability to manage health-related needs will improve Outcome: Progressing   Problem: Clinical Measurements: Goal: Will remain free from infection Outcome: Progressing

## 2024-01-19 NOTE — Assessment & Plan Note (Signed)
 T2DM: A1c 6.6 in 09/2023.  Hold home metformin  1000 mg twice daily and Jardiance  25 mg daily.  Consider restarting +/- SSI while inpatient Chronic pain: Home oxycodone  5 mg every 4 hours as needed (will provide short course, pt did not seem to be on this chronically per fill history.) HTN: Home spironolactone  Dementia: Seems to be at baseline HLD: home atorvastatin  10 mg daily Neuropathy: duloxetine  60 mg daily and gabapentin  400 mg twice daily (decreased from 800 BID due to ?encephalopathy)

## 2024-01-19 NOTE — Discharge Instructions (Addendum)
 Dear [name],  Thank you for letting us  participate in your care. You were hospitalized for rectal bleeding and diagnosed with a esophageal varices and possible lower GI bleeding. You were treated with blood transfusions and your esophageal varices were banded with endoscopy.   POST-HOSPITAL & CARE INSTRUCTIONS Please follow-up in our [facility] Family Medicine clinic on Tuesday 7/7 11AM. They will recheck your blood levels to make sure you aren't losing more blood.  Go to your follow up appointments (listed below)   DOCTOR'S APPOINTMENT   Future Appointments  Date Time Provider Department Center  01/25/2024 11:00 AM ACCESS TO CARE POOL [facility]  01/29/2024  8:30 AM Armbruster, Elspeth SQUIBB, MD [facility]  02/01/2024  2:30 PM Howell Lunger, DO [facility]     Take care and be well!  Family Medicine Teaching Service Inpatient Team [place]  [facility]  [address] [place], [place] [address] [phone]

## 2024-01-19 NOTE — Assessment & Plan Note (Signed)
 Follows with neurosurgery outpatient.  CT angio with progression of this injury.  Patient prefers home with personal care services over SNF. -- PT/OT eval and treat -- Fall precautions -- Appreciate ongoing TOC recommendations

## 2024-01-19 NOTE — Assessment & Plan Note (Signed)
-  Continue home Lasix and spironolactone

## 2024-01-19 NOTE — TOC Progression Note (Signed)
 Transition of Care [facility]) - Progression Note    Patient Details  Name: [name] MRN: [id number] Date of Birth: Mar 07, 1956  Transition of Care [facility]) CM/SW Contact  Luise JAYSON Pan, [place] Phone Number: 01/19/2024, 9:42 AM  Clinical Narrative:   Per Financial Counseling, patient does have medicaid but it is only a plan to cover Medicare part B. There are no other medical benefits attached.   TOC will continue to follow.    Expected Discharge Plan: Home w Home Health Services Barriers to Discharge: Continued Medical Work up  Expected Discharge Plan and Services In-house Referral: Clinical Social Work Discharge Planning Services: CM Consult   Living arrangements for the past 2 months: Single Family Home                                       Social Determinants of Health (SDOH) Interventions SDOH Screenings   Food Insecurity: No Food Insecurity (01/17/2024)  Housing: Low Risk  (01/17/2024)  Transportation Needs: No Transportation Needs (01/17/2024)  Utilities: Not At Risk (01/17/2024)  Depression (PHQ2-9): Medium Risk (09/24/2022)  Financial Resource Strain: Low Risk  (08/05/2019)   Received from [facility]  Physical Activity: Sufficiently Active (08/05/2019)   Received from [facility]  Social Connections: Socially Isolated (01/17/2024)  Stress: No Stress Concern Present (08/05/2019)   Received from [facility]  Tobacco Use: Low Risk  (01/17/2024)    Readmission Risk Interventions     No data to display

## 2024-01-19 NOTE — Progress Notes (Signed)
 Daily Progress Note Intern Pager: [phone]  Patient name: [name] Medical record number: [id number] Date of birth: 10/05/1955 Age: 68 y.o. Gender: female  Primary Care Provider: Howell Lunger, DO Consultants: GI Code Status: Full  Pt Overview and Major Events to Date:  6/28 admitted, received 2 units PRBC  6/29: EGD,  variceal banding  Assessment and Plan: MC is a 68 year old female here for acute blood loss anemia who presented with epistaxis, found to have esophageal varices on EGD 6/29.  Hemoglobin stable currently after varices were banded.     Pertinent PMH/PSH includes cirrhosis with ascites, T2DM, COPD, HTN, GERD, CVA, osteoporosis, mild dementia.  Assessment & Plan Esophageal varices without bleeding (HCC) Acute blood loss anemia Hgb 9.3, uptrending from yesterday.  VSS.  No further signs of bleeding in stool.  Patient should be medically stable for discharge later today or tomorrow. - Transition IV Protonix  40 mg twice daily to p.o. Protonix  40 mg daily (per GI recs) -Stop ceftriaxone.  Low concern for SBP at this time. -Planning for outpatient colonoscopy - GI consulted, appreciate recs. -- Transfusion threshold less than 7 -- Monitor epistaxis, consider ENT consult if worsening/recurrence  Decompensated liver disease (HCC)  -Continue home Lasix  and spironolactone  Compression fracture of fifth lumbar vertebra (HCC) Follows with neurosurgery outpatient.  CT angio with progression of this injury.  Patient prefers home with personal care services over SNF. -- PT/OT eval and treat -- Fall precautions -- Appreciate ongoing TOC recommendations Chronic health problem T2DM: A1c 6.6 in 09/2023.  Hold home metformin  1000 mg twice daily and Jardiance  25 mg daily.  Consider restarting +/- SSI while inpatient Chronic pain: Home oxycodone  5 mg every 4 hours as needed (will provide short course, pt did not seem to be on this chronically per fill history.) HTN: Home  spironolactone  Dementia: Seems to be at baseline HLD: home atorvastatin  10 mg daily Neuropathy: duloxetine  60 mg daily and gabapentin  400 mg twice daily (decreased from 800 BID due to ?encephalopathy)     FEN/GI: Soft PPx: SCDs Dispo: Home with home health  Subjective:  Reports small BM last night, denies any blood in her stool.  Objective: Temp:  [98 F (36.7 C)-98.3 F (36.8 C)] 98 F (36.7 C) (07/01 0732) Pulse Rate:  [78-96] 78 (07/01 0732) Resp:  [17-20] 20 (07/01 0732) BP: (108-127)/(62-74) 108/62 (07/01 0732) SpO2:  [95 %-99 %] 99 % (07/01 0732) Weight:  [88.5 kg] 88.5 kg (07/01 0437) Physical Exam: General: Alert, sitting up in chair.  NAD Cardiovascular: RRR Respiratory: CTAB.  Normal WOB on RA Abdomen: Soft, nondistended, nontender Laboratory: Most recent CBC Lab Results  Component Value Date   WBC 5.6 01/19/2024   HGB 9.3 (L) 01/19/2024   HCT 28.9 (L) 01/19/2024   MCV 93.5 01/19/2024   PLT 112 (L) 01/19/2024   Most recent BMP    Latest Ref Rng & Units 01/19/2024    2:53 AM  BMP  Glucose 70 - 99 mg/dL 866   BUN 8 - 23 mg/dL 8   Creatinine 9.55 - 8.99 mg/dL 9.42   Sodium 864 - 854 mmol/L 135   Potassium 3.5 - 5.1 mmol/L 3.9   Chloride 98 - 111 mmol/L 105   CO2 22 - 32 mmol/L 24   Calcium  8.9 - 10.3 mg/dL 8.1      Elicia Hamlet, MD 01/19/2024, 10:52 AM  PGY-3, [place] Family Medicine FPTS Intern pager: [phone], text pages welcome Secure chat group [facility] Family Medicine  Hospital Teaching Service

## 2024-01-19 NOTE — Discharge Summary (Addendum)
 Family Medicine Teaching [facility] Discharge Summary  Patient name: [name] Medical record number: [id number] Date of birth: 02/07/1956 Age: 68 y.o. Gender: female Date of Admission: 01/16/2024  Date of Discharge: 01/19/2024 Admitting Physician: Camie LITTIE Mulch, MD  Primary Care Provider: Howell Lunger, DO Consultants: GI  Indication for Hospitalization: Hematochezia, acute blood loss anemia  Brief Hospital Course:  [name] is a 68 y.o.female with a history of T2DM, COPD, HTN, GERD, CVA, osteoporosis, and mild dementia who was admitted to the [facility] Medicine teaching Service at [facility] for rectal bleeding with concern for GIB. Her hospital course is detailed below:  Hematochezia  Patient presented with a hemoglobin of 7.7 down from her baseline of 12.  Notably had significant epistaxis prior to admission which required cautery.  Continued to have a bleeding at home and then concern for hematemesis and rectal bleeding.  CTA without evidence of active GI bleed, but showed cirrhosis with distal esophageal varices and a stable 1 cm splenic artery aneurysm.  GI was consulted and was concerned that the rectal bleeding is likely secondary to swallowing blood vs upper GI cause. EGD demonstrated 5 nonbleeding esophageal varices that were banded. GI recommended outpatient colonoscopy due to low concern for active lower GI bleed.  Patient's diet was advanced appropriately after EGD and she was tolerating a regular diet by the date of discharge. Hgb remained stable.  Cirrhosis History of cirrhosis with ascites, however imaging revealed no current ascites.  Was placed on empiric ceftriaxone for SBP prophylaxis (6/29- 7/1). Continued home furosemide  and spironolactone  and used fluids cautiously. Pt was at neurologic baseline at discharge.   Compression fracture of 5th lumbar vertebra CTA did not show any progression of compression fracture for which she managed by neurosurgery outpatient.  Patient was ultimately discharged to HHPT on 7/1.  Other chronic conditions were medically managed with home medications and formulary alternatives as necessary (T2DM, chronic pain, HTN, dementia, HLD, and neuropathy)  PCP Follow-up Recommendations: Repeat banding outpatient to eradicate varices per GI. Repeat CBC Consider ENT referral for epistaxis Follow-up about personal care services   Disposition: Home w/ HHPT  Discharge Condition: Improved, stable  Discharge Exam:  General: Alert, sitting up in chair.  NAD Cardiovascular: RRR Respiratory: CTAB.  Normal WOB on RA Abdomen: Soft, nondistended, nontender   Significant Procedures: Variceal banding  Significant Labs and Imaging:  Recent Labs  Lab 01/17/24 1146 01/17/24 1303 01/18/24 0252 01/19/24 0253  WBC 4.9  --  5.2 5.6  HGB 8.4* 8.7* 8.7* 9.3*  HCT 25.9* 26.6* 26.6* 28.9*  PLT 104*  --  112* 112*   Recent Labs  Lab 01/17/24 1146 01/18/24 0252 01/19/24 0253  NA 137 136 135  K 3.5 3.3* 3.9  CL 107 106 105  CO2 22 22 24   GLUCOSE 117* 114* 133*  BUN 19 13 8   CREATININE 0.61 0.55 0.57  CALCIUM  7.9* 8.0* 8.1*  MG  --  1.7  --   ALKPHOS 86 86 90  AST 72* 65* 48*  ALT 45* 47* 40  ALBUMIN 2.4* 2.5* 2.6*     Discharge Medications:  Allergies as of 01/19/2024       Reactions   Hydrocodone -acetaminophen  Nausea And Vomiting   Pregabalin  Nausea And Vomiting        Medication List     STOP taking these medications    aspirin -acetaminophen -caffeine 250-250-65 MG tablet Commonly known as: EXCEDRIN MIGRAINE   omeprazole  20 MG capsule Commonly known as: PRILOSEC  TAKE these medications    albuterol  (2.5 MG/3ML) 0.083% nebulizer solution Commonly known as: PROVENTIL  Take 3 mLs (2.5 mg total) by nebulization every 4 (four) hours as needed for wheezing. What changed: when to take this   albuterol  108 (90 Base) MCG/ACT inhaler Commonly known as: VENTOLIN  HFA Inhale 1-2 puffs into the lungs every  6 (six) hours as needed for wheezing. What changed: how much to take   atorvastatin  10 MG tablet Commonly known as: LIPITOR Take 1 tablet (10 mg total) by mouth daily.   CALCIUM -D PO Take 1 tablet by mouth daily.   DULoxetine  60 MG capsule Commonly known as: CYMBALTA  TAKE ONE CAPSULE BY MOUTH DAILY   empagliflozin  25 MG Tabs tablet Commonly known as: JARDIANCE  Take 1 tablet (25 mg total) by mouth daily.   ferrous gluconate  324 MG tablet Commonly known as: FERGON Take 1 tablet (324 mg total) by mouth daily with breakfast.   furosemide  40 MG tablet Commonly known as: LASIX  Take 1 tablet (40 mg total) by mouth daily. Get your blood work checked 2-3 times per year on this medication.   gabapentin  800 MG tablet Commonly known as: NEURONTIN  TAKE ONE (1) TABLET BY MOUTH TWO (2) TIMES DAILY What changed: See the new instructions.   linaclotide  290 MCG Caps capsule Commonly known as: LINZESS  Take 1 capsule (290 mcg total) by mouth daily before breakfast. What changed: Another medication with the same name was removed. Continue taking this medication, and follow the directions you see here.   metFORMIN  1000 MG tablet Commonly known as: GLUCOPHAGE  TAKE 1 TABLET BY MOUTH TWICE DAILY WITH MEALS   methocarbamol  500 MG tablet Commonly known as: ROBAXIN  TAKE 1 TABLET BY MOUTH TWICE DAILY AS NEEDED FOR MUSCLE SPASMS What changed: when to take this   multivitamin with minerals Tabs tablet Take 1 tablet by mouth daily.   naloxone 4 MG/0.1ML Liqd nasal spray kit Commonly known as: NARCAN Place 4 mg into the nose as needed (opioid overdose).   oxyCODONE  5 MG immediate release tablet Commonly known as: Oxy IR/ROXICODONE  Take 1 tablet (5 mg total) by mouth every 4 (four) hours as needed for moderate pain (pain score 4-6).   pantoprazole  40 MG tablet Commonly known as: Protonix  Take 1 tablet (40 mg total) by mouth daily.   senna 8.6 MG Tabs tablet Commonly known as: SENOKOT Take  1 tablet by mouth daily.   spironolactone  100 MG tablet Commonly known as: ALDACTONE  TAKE ONE (1) TABLET BY MOUTH EVERY DAY What changed: how much to take        Discharge Instructions: Please refer to Patient Instructions section of EMR for full details.  Patient was counseled important signs and symptoms that should prompt return to medical care, changes in medications, dietary instructions, activity restrictions, and follow up appointments.   Follow-Up Appointments: 7/7 at [facility] family medicine clinic  Elicia Hamlet, MD 01/19/2024, 11:14 AM PGY-3, [facility] Health Family Medicine

## 2024-01-19 NOTE — TOC Transition Note (Addendum)
 Transition of Care [facility]) - Discharge Note   Patient Details  Name: [name] MRN: [id number] Date of Birth: 1956/06/24  Transition of Care [facility]) CM/SW Contact:  Waddell Barnie Rama, RN Phone Number: 01/19/2024, 12:44 PM   Clinical Narrative:    For dc today,  she needs HHPT, HHOT , NCM offered choice, she wants to call her friend to see who she would like for her to have.  Also she will need bsc , she is ok with [employer] to supply this.  She is ok with [facility], NCM made referral to [facility] with [facility], she is able to take referral.  Soc will begin 24 to 48 hrs post dc.  Patient has transportation.   [employer] to deliver bsc to [employer].     Barriers to Discharge: Continued Medical Work up   Patient Goals and CMS Choice Patient states their goals for this hospitalization and ongoing recovery are:: To go home          Discharge Placement                       Discharge Plan and Services Additional resources added to the After Visit Summary for   In-house Referral: Clinical Social Work Discharge Planning Services: CM Consult                                 Social Drivers of Health (SDOH) Interventions SDOH Screenings   Food Insecurity: No Food Insecurity (01/17/2024)  Housing: Low Risk  (01/17/2024)  Transportation Needs: No Transportation Needs (01/17/2024)  Utilities: Not At Risk (01/17/2024)  Depression (PHQ2-9): Medium Risk (09/24/2022)  Financial Resource Strain: Low Risk  (08/05/2019)   Received from [facility]  Physical Activity: Sufficiently Active (08/05/2019)   Received from [facility]  Social Connections: Socially Isolated (01/17/2024)  Stress: No Stress Concern Present (08/05/2019)   Received from [facility]  Tobacco Use: Low Risk  (01/17/2024)     Readmission Risk Interventions     No data to display

## 2024-01-21 NOTE — Anesthesia Postprocedure Evaluation (Signed)
 Anesthesia Post Note  Patient: [name]  Procedure(s) Performed: EGD (ESOPHAGOGASTRODUODENOSCOPY)     Patient location during evaluation: PACU Anesthesia Type: General and Regional Level of consciousness: awake and alert Pain management: pain level controlled Vital Signs Assessment: post-procedure vital signs reviewed and stable Respiratory status: spontaneous breathing, nonlabored ventilation, respiratory function stable and patient connected to nasal cannula oxygen Cardiovascular status: blood pressure returned to baseline and stable Postop Assessment: no apparent nausea or vomiting Anesthetic complications: no   No notable events documented.  Last Vitals:  Vitals:   01/19/24 0732 01/19/24 1133  BP: 108/62 (!) 94/58  Pulse: 78   Resp: 20 17  Temp: 36.7 C 36.7 C  SpO2: 99% 98%    Last Pain:  Vitals:   01/19/24 1133  TempSrc: Oral  PainSc:                  Dantrell Schertzer

## 2024-01-25 ENCOUNTER — Other Ambulatory Visit: Payer: Self-pay | Admitting: Family Medicine

## 2024-01-25 ENCOUNTER — Telehealth: Payer: Self-pay | Admitting: Physician Assistant

## 2024-01-25 ENCOUNTER — Inpatient Hospital Stay: Payer: Self-pay

## 2024-01-25 DIAGNOSIS — D649 Anemia, unspecified: Secondary | ICD-10-CM

## 2024-01-25 NOTE — Telephone Encounter (Signed)
 Patient called and stated that [name] order some lab work for her but when she went over to [facility] they did not have any orders for her. Patient is requesting that we sent over throught fax the lab order at [phone]. Patient is requesting a call back once those lab orders have been sent over. Please advise.

## 2024-01-25 NOTE — Progress Notes (Deleted)
    SUBJECTIVE:   CHIEF COMPLAINT / HPI:   PCP Follow-up Recommendations: Repeat banding outpatient to eradicate varices per GI. - has appt with GI 01/29/24 Repeat CBC - completed Consider ENT referral for epistaxis Re: her epistaxis, this is second episode (previous one February 2024), both leading to hospitalization for ABLA. Recommend consideration of ENT evaluation even if she has no more episodes, as when she does have bleeding she has ended up with very low hemoglobin and a hospital stay. I discussed this with her and her daughter and they ae amenable to ENT evaluation outpatient. - ENT referral sent Follow-up about personal care services Re: her colonoscopy: has never had one and was deferred this hospital stay by her personal GI physician. She had failed to follow up previously when she was supposed to get this completed. GU is aware and perhaps PCP can also hep track that this actually is completed. : has appt with GI 01/29/24  Admitted 6/28 - 7/1 for acute blood loss anemia 2/2 rectal bleeding and epistaxis  PERTINENT  PMH / PSH: mixed alzheimer's and vascular dementia, chronic venous insufficiency, esophageal varices without bleeding, COPD, cirrhosis, GERD, T2DM, spondylosis  OBJECTIVE:   There were no vitals taken for this visit.  ***  ASSESSMENT/PLAN:   Assessment & Plan      Elyce Prescott, DO [facility]

## 2024-01-25 NOTE — Telephone Encounter (Signed)
 Patient is advised that our office has not ordered any additional labwork at this time. It does appear that [employer], DO ordered a CBC at [facility] today (PCP). Advised patient that she does have a follow up with Dr Leigh scheduled 01/29/24 and should keep this appointment. She verbalizes understanding.

## 2024-01-28 ENCOUNTER — Telehealth: Payer: Self-pay

## 2024-01-28 NOTE — Telephone Encounter (Signed)
 Cindy from [facility] LVM to let Dr know that pt wants to postpone service until July 15th. No name of office or phone number was given.  Margit Dimes, CMA

## 2024-01-29 ENCOUNTER — Encounter: Payer: Self-pay | Admitting: Gastroenterology

## 2024-01-29 ENCOUNTER — Ambulatory Visit (INDEPENDENT_AMBULATORY_CARE_PROVIDER_SITE_OTHER): Admitting: Gastroenterology

## 2024-01-29 ENCOUNTER — Ambulatory Visit: Payer: Self-pay | Admitting: Gastroenterology

## 2024-01-29 ENCOUNTER — Other Ambulatory Visit (INDEPENDENT_AMBULATORY_CARE_PROVIDER_SITE_OTHER)

## 2024-01-29 VITALS — BP 132/72 | HR 90 | Ht 64.0 in | Wt 204.1 lb

## 2024-01-29 DIAGNOSIS — K7460 Unspecified cirrhosis of liver: Secondary | ICD-10-CM

## 2024-01-29 DIAGNOSIS — Z860100 Personal history of colon polyps, unspecified: Secondary | ICD-10-CM

## 2024-01-29 DIAGNOSIS — K649 Unspecified hemorrhoids: Secondary | ICD-10-CM

## 2024-01-29 DIAGNOSIS — I8500 Esophageal varices without bleeding: Secondary | ICD-10-CM

## 2024-01-29 LAB — CBC WITH DIFFERENTIAL/PLATELET
Basophils Absolute: 0 K/uL (ref 0.0–0.1)
Basophils Relative: 0.7 % (ref 0.0–3.0)
Eosinophils Absolute: 0.1 K/uL (ref 0.0–0.7)
Eosinophils Relative: 3 % (ref 0.0–5.0)
HCT: 29.7 % — ABNORMAL LOW (ref 36.0–46.0)
Hemoglobin: 9.4 g/dL — ABNORMAL LOW (ref 12.0–15.0)
Lymphocytes Relative: 24.2 % (ref 12.0–46.0)
Lymphs Abs: 1 K/uL (ref 0.7–4.0)
MCHC: 31.8 g/dL (ref 30.0–36.0)
MCV: 95.9 fl (ref 78.0–100.0)
Monocytes Absolute: 0.7 K/uL (ref 0.1–1.0)
Monocytes Relative: 18.4 % — ABNORMAL HIGH (ref 3.0–12.0)
Neutro Abs: 2.1 K/uL (ref 1.4–7.7)
Neutrophils Relative %: 53.7 % (ref 43.0–77.0)
Platelets: 108 K/uL — ABNORMAL LOW (ref 150.0–400.0)
RBC: 3.1 Mil/uL — ABNORMAL LOW (ref 3.87–5.11)
RDW: 18.5 % — ABNORMAL HIGH (ref 11.5–15.5)
WBC: 4 K/uL (ref 4.0–10.5)

## 2024-01-29 MED ORDER — CALMOL-4 76-10 % RE SUPP: RECTAL | 0 refills | Status: AC

## 2024-01-29 NOTE — Progress Notes (Signed)
 HPI :  68 year old female known to me for MASH cirrhosis diagnosed years ago (incidentally on imaging in 2021, presented with ascites in recent years), negative serologic workup, no alcohol  use.  Unfortunately she was admitted to the hospital in June in recent weeks with a GI bleed.  Recall she was actually in the hospital in [place] for significant epistaxis which led to intervention per ENT.  Upon discharge there she had significant recurrence of nosebleed at home and states she swallowed a lot of blood, came into the ER with hematemesis and blood per rectum.  CTA was negative for bleeding at the time but she had developed varices on that exam which are new.  I performed an upper endoscopy for her on June 29.  She did have a large varices without any high risk stigmata for bleeding.  While I suspected she had nosebleed causing her bleeding symptoms I elected to band her varices given her underlying COPD, inhaler use, she is not a good candidate for nonselective beta-blockade.  She is done well post hospitalization.  No further bleeding symptoms.  She has had some mild dysphagia initially after the banding but states she is doing better in this regard but it has not entirely resolved.  She does not really have any complaints today.  Her stools have returned to normal brown color.  She does have some clear mucus in her stool and symptoms from hemorrhoids in the setting of constipation.  She is on a bowel regimen which allows her to move her bowels.  She also is taking iron  supplementation.  Recall she is overdue for colonoscopy.  She had not been able to follow-up without as outpatient due to orthopedic issues.  Her last exam was limited by a poor prep and she does have polyps in her colon historically.  Otherwise feels well without complaints today.   CTA 01/16/24: IMPRESSION: 1. No evidence of active GI bleed. 2. Cirrhosis with distal esophageal varices. 3. 1 cm splenic artery aneurysm,  stable. 4. Multiple thoracolumbar vertebral compression deformities, new or progressive since 10/27/2022.     Prior workup: Colonoscopy 07/26/19: - Hemorrhoids were found on perianal exam. - A few medium-mouthed diverticula were found in the sigmoid colon. - A moderate amount of solid stool was found in the entire colon, interfering with visualization. Prep was worst in the right colon, could not clear the cecum and right colon. - Three sessile polyps were found in the sigmoid colon. The polyps were 2 to 5 mm in size. These polyps were removed with a cold snare. There was persistent oozing at the largest polypectomy site after several minutes of observation. Snare tip coagulation was performed to the area with good hemostasis. Resection and retrieval were complete. - Internal hemorrhoids were found during retroflexion. - The sigmoid colon had a very restricted lumen, which took some time to traverse, using mostly water immersion. The exam was otherwise without abnormality.   Surgical [P], colon, sigmoid, polyp - TUBULAR ADENOMA (X3 FRAGMENTS). - NO HIGH GRADE DYSPLASIA OR MALIGNANCY.   Recommended a repeat exam using double prep     Colonoscopy 03/14/2016: - The perianal and digital rectal examinations were normal. - A 4 mm polyp was found in the cecum. The polyp was sessile. The polyp was removed with a cold snare. Resection and retrieval were complete. - A 4 mm polyp was found in the ascending colon. The polyp was flat. The polyp was removed with a cold biopsy forceps. Resection and retrieval were  complete. - A 6 mm polyp was found in the sigmoid colon. The polyp was sessile. The polyp was removed with a cold snare. Resection and retrieval were complete. - The colon was tortuous with restricted mobility of the left colon, which led to a prolonged cecal intubation. - Non-bleeding internal hemorrhoids were found during retroflexion. - A single small angiodysplastic lesion was found in the descending  colon. - The exam was otherwise without abnormality.   Surgical [P], sigmoid, cecum, ascending, polyp (3) - TUBULAR ADENOMAS. NO HIGH GRADE DYSPLASIA OR INVASIVE MALIGNANCY IDENTIFIED.      Echo 09/17/22: EF 65-70% Grade I DD Mild AS   EGD 01/17/24: The Z-line was regular. Findings: A small hiatal hernia was present. Large (> 5 mm) varices were found in the middle third of the esophagus and in the lower third of the esophagus. One column of large varix with red spot and one medium sized varix. No stigmata of recent bleeding noted. Five bands were successfully placed with eradication, resulting in deflation of varices. 1 band on medium varix, 4 on the larger varix. The exam of the esophagus was otherwise normal. Mild portal hypertensive gastropathy was found in the entire examined stomach. The exam of the stomach was otherwise normal. The examined duodenum was normal.  I think she more than likely had her primary bleed from epistaxis, however varices are large with red spot, not a good candidate for beta blockade given COPD / inhalers, I performed banding to help prevent future bleeding and in case related to her current presentation (again I think less likely she truly had a variceal bleed)   US  abdomen 12/10/23: IMPRESSION: 1. Cirrhotic liver. No focal liver lesion identified. 2. Mild right hydronephrosis.     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   Osteoporosis    in lower back per pt    Partial small bowel obstruction (HCC) 01/30/2020   Plantar fasciitis    Snoring 08/28/2017    Stroke (HCC) 15 years ago    residual left sided weakness   Subacute maxillary sinusitis 05/19/2020   Tremors of nervous system    Uterovaginal prolapse, incomplete 06/10/2010   Qualifier: Diagnosis of  By: Gerlene NP, Devere     Vertigo      Past Surgical History:  Procedure Laterality Date   APPENDECTOMY     BLADDER SURGERY     x2   CATARACT EXTRACTION W/PHACO Left 05/06/2017   Procedure: CATARACT EXTRACTION PHACO AND INTRAOCULAR LENS PLACEMENT (IOC) LEFT DIABETIC;  Surgeon: Mittie Gaskin, MD;  Location: [facility];  Service: Ophthalmology;  Laterality: Left;  Diabetic - oral meds   CATARACT EXTRACTION W/PHACO Right 06/03/2017   Procedure: CATARACT EXTRACTION PHACO AND INTRAOCULAR LENS PLACEMENT (IOC) RIGHT DIABETIC;  Surgeon: Mittie Gaskin, MD;  Location: [facility];  Service: Ophthalmology;  Laterality: Right;  Diabetic - oral meds   CHOLECYSTECTOMY     COLONOSCOPY  03/14/2016   ESOPHAGOGASTRODUODENOSCOPY N/A 01/17/2024   Procedure: EGD (ESOPHAGOGASTRODUODENOSCOPY);  Surgeon: Leigh Standing  P, MD;  Location: [facility];  Service: Gastroenterology;  Laterality: N/A;   FOOT SURGERY     IR PARACENTESIS  09/17/2022   KYPHOPLASTY N/A 04/07/2019   Procedure: L4 KYPHOPLASTY;  Surgeon: Kathlynn Sharper, MD;  Location: [facility];  Service: Orthopedics;  Laterality: N/A;   KYPHOPLASTY N/A 10/13/2023   Procedure: KYPHOPLASTY LUMBAR FIVE;  Surgeon: Debby Dorn MATSU, MD;  Location: [facility] OR;  Service: Neurosurgery;  Laterality: N/A;   TOTAL ABDOMINAL HYSTERECTOMY     TOTAL KNEE ARTHROPLASTY Left 09/21/2018   Procedure: TOTAL KNEE ARTHROPLASTY-LEFT NEEDS RNFA;  Surgeon: Kathlynn Sharper, MD;  Location: [facility];  Service: Orthopedics;  Laterality: Left;   Family History  Problem Relation Age of Onset   Healthy Mother    Heart disease Father    Alcohol  abuse Father    Heart disease Sister    Diabetes Sister    Alcohol  abuse Brother    Breast cancer Daughter     Colon polyps Neg Hx    Colon cancer Neg Hx    Esophageal cancer Neg Hx    Rectal cancer Neg Hx    Stomach cancer Neg Hx    Pancreatic cancer Neg Hx    Social History   Tobacco Use   Smoking status: Never    Passive exposure: Never   Smokeless tobacco: Never  Vaping Use   Vaping status: Never Used  Substance Use Topics   Alcohol  use: No   Drug use: No   Current Outpatient Medications  Medication Sig Dispense Refill   albuterol  (PROVENTIL ) (2.5 MG/3ML) 0.083% nebulizer solution Take 3 mLs (2.5 mg total) by nebulization every 4 (four) hours as needed for wheezing. 75 mL 2   albuterol  (VENTOLIN  HFA) 108 (90 Base) MCG/ACT inhaler Inhale 1-2 puffs into the lungs every 6 (six) hours as needed for wheezing. 2 each 2   atorvastatin  (LIPITOR) 10 MG tablet Take 1 tablet (10 mg total) by mouth daily. 90 tablet 3   Calcium  Carbonate-Vitamin D  (CALCIUM -D PO) Take 1 tablet by mouth daily.     DULoxetine  (CYMBALTA ) 60 MG capsule TAKE ONE CAPSULE BY MOUTH DAILY 90 capsule 3   empagliflozin  (JARDIANCE ) 25 MG TABS tablet Take 1 tablet (25 mg total) by mouth daily. 90 tablet 3   ferrous gluconate  (FERGON) 324 MG tablet Take 1 tablet (324 mg total) by mouth daily with breakfast. 90 tablet 3   furosemide  (LASIX ) 40 MG tablet Take 1 tablet (40 mg total) by mouth daily. Get your blood work checked 2-3 times per year on this medication. 90 tablet 1   gabapentin  (NEURONTIN ) 800 MG tablet TAKE ONE (1) TABLET BY MOUTH TWO (2) TIMES DAILY 180 tablet 3   linaclotide  (LINZESS ) 290 MCG CAPS capsule Take 1 capsule (290 mcg total) by mouth daily before breakfast.     metFORMIN  (GLUCOPHAGE ) 1000 MG tablet TAKE 1 TABLET BY MOUTH TWICE DAILY WITH MEALS 180 tablet 3   methocarbamol  (ROBAXIN ) 500 MG tablet TAKE 1 TABLET BY MOUTH TWICE DAILY AS NEEDED FOR MUSCLE SPASMS 60 tablet 2   Multiple Vitamin (MULTIVITAMIN WITH MINERALS) TABS tablet Take 1 tablet by mouth daily.     naloxone (NARCAN) nasal spray 4 mg/0.1 mL Place 4  mg into the nose as needed (opioid overdose).     oxyCODONE  (OXY IR/ROXICODONE ) 5 MG immediate release tablet Take 1 tablet (5 mg total) by mouth every 4 (four) hours as needed for moderate pain (pain score 4-6). 15 tablet 0   pantoprazole  (PROTONIX ) 40  MG tablet Take 1 tablet (40 mg total) by mouth daily. 30 tablet 0   senna (SENOKOT) 8.6 MG TABS tablet Take 1 tablet by mouth daily.     spironolactone  (ALDACTONE ) 100 MG tablet TAKE ONE (1) TABLET BY MOUTH EVERY DAY 90 tablet 3   No current facility-administered medications for this visit.   Allergies  Allergen Reactions   Hydrocodone -Acetaminophen  Nausea And Vomiting   Pregabalin  Nausea And Vomiting     Review of Systems: All systems reviewed and negative except where noted in HPI.    CT ANGIO GI BLEED Result Date: 01/16/2024 CLINICAL DATA:  Liver failure, bleeding EXAM: CTA ABDOMEN AND PELVIS WITHOUT AND WITH CONTRAST TECHNIQUE: Multidetector CT imaging of the abdomen and pelvis was performed using the standard protocol during bolus administration of intravenous contrast. Multiplanar reconstructed images and MIPs were obtained and reviewed to evaluate the vascular anatomy. RADIATION DOSE REDUCTION: This exam was performed according to the departmental dose-optimization program which includes automated exposure control, adjustment of the mA and/or kV according to patient size and/or use of iterative reconstruction technique. CONTRAST:  OMNIPAQUE  IOHEXOL  350 MG/ML SOLN COMPARISON:  09/13/2022 FINDINGS: VASCULAR Aorta: Normal caliber aorta without aneurysm, dissection, vasculitis or significant stenosis. Celiac: Patent without evidence of aneurysm, dissection, vasculitis or significant stenosis. Classic trifurcation branch anatomy. 1 cm peripherally calcified aneurysm splenic artery aneurysm near the splenic hilum as before. SMA: Patent without evidence of aneurysm, dissection, vasculitis or significant stenosis. Renals: Normal single  bilaterally, both with calcified ostial plaque resulting in minimal stenosis, patent distally. IMA: Patent without evidence of aneurysm, dissection, vasculitis or significant stenosis. Inflow: Patent without evidence of aneurysm, dissection, vasculitis or significant stenosis. Proximal Outflow: Bilateral common femoral and visualized portions of the superficial and profunda femoral arteries are patent without evidence of aneurysm, dissection, vasculitis or significant stenosis. Veins: Distal esophageal varices. Patent hepatic veins, portal vein, SM V, splenic vein, bilateral renal veins, iliac venous system and IVC. Review of the MIP images confirms the above findings. NON-VASCULAR Lower chest: No pleural or pericardial effusion. Linear scarring or atelectasis posteriorly in the lung bases. Hepatobiliary: Nodular contour. Cholecystectomy clips. No focal lesion or biliary ductal dilatation. Pancreas: Unremarkable. No pancreatic ductal dilatation or surrounding inflammatory changes. Spleen: Borderline splenomegaly, 12.3 cm length.  No focal lesion. Adrenals/Urinary Tract: No adrenal mass. Symmetric renal parenchymal enhancement. No urolithiasis or hydronephrosis. Urinary bladder physiologically distended. Stomach/Bowel: Stomach is nondistended. Small bowel decompressed. Appendix not identified. The colon is partially distended, without acute finding. Lymphatic: Subcentimeter left para-aortic and aortocaval lymph nodes. No mesenteric or pelvic adenopathy. Reproductive: Choose 2 Other: Pelvic phleboliths.  No ascites.  No free air. Musculoskeletal: Vertebral compression deformities of T12, L1, L2, are new or progressive since radiographs 10/27/2022. New L5 compression deformity, post cement augmentation. Stable L4 compression deformity post cement augmentation. IMPRESSION: 1. No evidence of active GI bleed. 2. Cirrhosis with distal esophageal varices. 3. 1 cm splenic artery aneurysm, stable. 4. Multiple thoracolumbar  vertebral compression deformities, new or progressive since 10/27/2022. Electronically Signed   By: JONETTA Faes M.D.   On: 01/16/2024 19:19    Physical Exam: BP 132/72   Pulse 90   Ht 5' 4 (1.626 m)   Wt 204 lb 2 oz (92.6 kg)   SpO2 98%   BMI 35.04 kg/m  Constitutional: Pleasant,well-developed, female in no acute distress. Neurological: Alert and oriented to person place and time. Psychiatric: Normal mood and affect. Behavior is normal.   MELD 3.0: 13 at 01/18/2024  2:52  AM MELD-Na: 10 at 01/18/2024  2:52 AM Calculated from: Serum Creatinine: 0.55 mg/dL (Using min of 1 mg/dL) at 3/69/7974  7:47 AM Serum Sodium: 136 mmol/L at 01/18/2024  2:52 AM Total Bilirubin: 1.1 mg/dL at 3/69/7974  7:47 AM Serum Albumin: 2.5 g/dL at 3/69/7974  7:47 AM INR(ratio): 1.3 at 01/16/2024  4:34 PM Age at listing (hypothetical): 68 years Sex: Female at 01/18/2024  2:52 AM    ASSESSMENT: 68 y.o. female here for assessment of the following  1. Hepatic cirrhosis, unspecified hepatic cirrhosis type, unspecified whether ascites present (HCC)   2. Esophageal varices determined by endoscopy (HCC)   3. History of colon polyps   4. Hemorrhoids, unspecified hemorrhoid type    Patient has mostly been compensated with her liver disease, recently admitted for GI bleed which I think clinically was due to severe nosebleed and she admittedly swallowed blood.  EGD however showed new large varices, no stigmata for bleeding but I treated them endoscopically with banding given she is not a good candidate for nonselective beta-blockade.  MELD 13.  She has been feeling well since hospitalization.  We discussed interval development of varices, risk for rebleeding.  Recommending repeat EGD at the hospital in the next month for reassessment and additional banding as appropriate.  We discussed risks and benefits of this and she understands and wants to proceed.  At the same time, she is overdue for surveillance colonoscopy,  limited by prep on her last exam and with her altered bowel habits with mucus in the stool we will also evaluate that.  She will need double prep for her colonoscopy.  She agrees to proceed with this as well.  Continue bowel regimen for her constipation.  Colonoscopy will also evaluate her hemorrhoids.  Recommend Calmol 4 suppositories for now for irritation.  She is not a good candidate for banding given her history of cirrhosis.  She is due for repeat ultrasound and labs in December for [facility] screening and reassessment.  I will also send her to the lab for CBC today to make sure stable posthospitalization, we will also check her immunity to hepatitis A and B and vaccinate if needed.  She will see me every 6 months if not sooner moving forward.  PLAN: - schedule for EGD and colonoscopy at the hospital for banding, surveillance of polyps - double prep recommended for colonoscopy - lab today - CBC, and hepatitis A total antibody, and hepatitis B surface antibody - continue bowel regimen - Calmol4 suppositories PRN, not a good candidate for hemorrhoid banding - recall for RUQ US  and labs in December  Marcey Naval, MD [facility]

## 2024-01-29 NOTE — Patient Instructions (Signed)
 You have been scheduled for an endoscopy and colonoscopy. Please follow the written instructions given to you at your visit today.  If you use inhalers (even only as needed), please bring them with you on the day of your procedure.  DO NOT TAKE 7 DAYS PRIOR TO TEST- Trulicity (dulaglutide) Ozempic , Wegovy (semaglutide ) Mounjaro (tirzepatide) Bydureon Bcise (exanatide extended release)  DO NOT TAKE 1 DAY PRIOR TO YOUR TEST Rybelsus (semaglutide ) Adlyxin (lixisenatide) Victoza (liraglutide) Byetta (exanatide) ___________________________________________________________________________   We have given you samples of the following medication to take:  Calmol 4 suppositories - use as directed as needed  Please go to the lab in the basement of our building to have lab work done as you leave today. Hit B for basement when you get on the elevator.  When the doors open the lab is on your left.  We will call you with the results. Thank you.  You will be due for a ultrasound of your liver and blood work in December 2025. We will remind you as it gets closer.  Thank you for entrusting me with your care and for choosing [facility], Dr. Elspeth Naval    If your blood pressure at your visit was 140/90 or greater, please contact your primary care physician to follow up on this. ______________________________________________________  If you are age 67 or older, your body mass index should be between 23-30. Your Body mass index is 35.04 kg/m. If this is out of the aforementioned range listed, please consider follow up with your Primary Care Provider.  If you are age 69 or younger, your body mass index should be between 19-25. Your Body mass index is 35.04 kg/m. If this is out of the aformentioned range listed, please consider follow up with your Primary Care Provider.  ________________________________________________________  The [facility] providers would like to encourage you to  use MYCHART to communicate with providers for non-urgent requests or questions.  Due to long hold times on the telephone, sending your provider a message by [facility] may be a faster and more efficient way to get a response.  Please allow 48 business hours for a response.  Please remember that this is for non-urgent requests.  _______________________________________________________  Due to recent changes in healthcare laws, you may see the results of your imaging and laboratory studies on MyChart before your provider has had a chance to review them.  We understand that in some cases there may be results that are confusing or concerning to you. Not all laboratory results come back in the same time frame and the provider may be waiting for multiple results in order to interpret others.  Please give us  48 hours in order for your provider to thoroughly review all the results before contacting the office for clarification of your results.

## 2024-01-30 LAB — HEPATITIS B SURFACE ANTIBODY,QUALITATIVE: Hep B S Ab: NONREACTIVE

## 2024-01-30 LAB — HEPATITIS A ANTIBODY, TOTAL: Hepatitis A AB,Total: REACTIVE — AB

## 2024-02-01 ENCOUNTER — Encounter: Payer: Self-pay | Admitting: Student

## 2024-02-01 ENCOUNTER — Ambulatory Visit (INDEPENDENT_AMBULATORY_CARE_PROVIDER_SITE_OTHER): Admitting: Student

## 2024-02-01 ENCOUNTER — Ambulatory Visit
Admission: RE | Admit: 2024-02-01 | Discharge: 2024-02-01 | Disposition: A | Discharge status: Home / Self Care | Source: Ambulatory Visit | Attending: Family Medicine

## 2024-02-01 VITALS — BP 129/61 | HR 87 | Ht 64.0 in | Wt 202.1 lb

## 2024-02-01 DIAGNOSIS — M79605 Pain in left leg: Secondary | ICD-10-CM

## 2024-02-01 DIAGNOSIS — M810 Age-related osteoporosis without current pathological fracture: Secondary | ICD-10-CM | POA: Diagnosis not present

## 2024-02-01 DIAGNOSIS — D62 Acute posthemorrhagic anemia: Secondary | ICD-10-CM

## 2024-02-01 MED ORDER — PANTOPRAZOLE SODIUM 40 MG PO TBEC
40.0000 mg | DELAYED_RELEASE_TABLET | Freq: Every day | ORAL | 1 refills | Status: DC
Start: 2024-02-01 — End: 2024-03-22

## 2024-02-01 NOTE — Patient Instructions (Addendum)
 It was great to see you! Thank you for allowing me to participate in your care!   I recommend that you always bring your medications to each appointment as this makes it easy to ensure we are on the correct medications and helps us  not miss when refills are needed.  Our plans for today:  - I have sent a referral for our osteoporosis clinic, they will call to schedule you and do labs then - We are checking some labs today, I will call you if they are abnormal will send you a MyChart message or a letter if they are normal.  If you do not hear about your labs in the next 2 weeks please let us  know. An x-ray was ordered for you---you do not need an appointment to have this completed.  I recommend going to [facility] [address] [place]  If the results are normal,I will send you a letter  I will call you with results if anything is abnormal     Take care and seek immediate care sooner if you develop any concerns. Please remember to show up 15 minutes before your scheduled appointment time!  Gladis Church, DO [facility]

## 2024-02-01 NOTE — Assessment & Plan Note (Signed)
 No recent bleeding. - CBC today - Follow-up with GI for esophageal banding and outpatient colonoscopy

## 2024-02-01 NOTE — Progress Notes (Signed)
    SUBJECTIVE:   CHIEF COMPLAINT / HPI:   Hospital follow-up Hematochezia Cirrhosis Compression fracture of fifth lumbar vertebra Admitted on 01/16/2024 with GI bleed and discharged on 01/19/2024.  Patient presented with symptomatic anemia with hemoglobin of 7.7 (baseline 12.0).  Notably had significant epistaxis prior to admission which resolved chronic GI was consulted.  EGD demonstrated 5 nonbleeding esophageal varices that were banded.  GI recommending outpatient colonoscopy due to the concern for active lower GI bleed.  History of cirrhosis with ascites, however imaging did not reveal current ascites.  Was placed on empiric ceftriaxone  for SBP prophylaxis.  Continued home furosemide  spironolactone  and used fluids cautiously, did well during hospital stay.  CT imaging did not show any progression of her compression fracture.  She is managed by neurosurgery.  Discharged with home health PT on 7 1.   OBJECTIVE:   BP 129/61   Pulse 87   Ht 5' 4 (1.626 m)   Wt 202 lb 2 oz (91.7 kg)   SpO2 98%   BMI 34.69 kg/m    General: NAD, pleasant Cardio: RRR, no MRG. Cap Refill <2s. Respiratory: CTAB, normal wob on RA GI: Abdomen is soft, not tender, not distended. BS present Skin: Warm and dry  ASSESSMENT/PLAN:   Assessment & Plan Acute blood loss anemia No recent bleeding. - CBC today - Follow-up with GI for esophageal banding and outpatient colonoscopy Osteoporosis, unspecified osteoporosis type, unspecified pathological fracture presence Carries diagnosis of osteoporosis last DEXA distant 14 with osteopenia, however had suspected fragility fracture in her spine-thus giving her new diagnosis of osteoporosis.  She was excessively treated with 5 years of alendronate .  Does not have recent DEXA. - Will order DEXA -Given esophageal varices/no benefit of repeat bisphosphonate therapy, if she does have significant osteoporosis could consider injections Left leg pain Recent fall, mild  ecchymosis and swelling over left tibia. - X-ray r/o fracture   Gladis Church, DO [facility]

## 2024-02-02 LAB — CBC
Hematocrit: 31.1 % — ABNORMAL LOW (ref 34.0–46.6)
Hemoglobin: 9.6 g/dL — ABNORMAL LOW (ref 11.1–15.9)
MCH: 30.2 pg (ref 26.6–33.0)
MCHC: 30.9 g/dL — ABNORMAL LOW (ref 31.5–35.7)
MCV: 98 fL — ABNORMAL HIGH (ref 79–97)
Platelets: 109 x10E3/uL — ABNORMAL LOW (ref 150–450)
RBC: 3.18 x10E6/uL — ABNORMAL LOW (ref 3.77–5.28)
RDW: 16.1 % — ABNORMAL HIGH (ref 11.7–15.4)
WBC: 4.3 x10E3/uL (ref 3.4–10.8)

## 2024-02-03 ENCOUNTER — Telehealth: Payer: Self-pay | Admitting: Student

## 2024-02-03 ENCOUNTER — Ambulatory Visit: Payer: Self-pay | Admitting: Student

## 2024-02-03 NOTE — Telephone Encounter (Signed)
 Called patient's daughter-this patient needs assistance with her care.  She has does not had [place] release for [employer] (POA).  I spoke with Damien, to inform her that DEXA scan has been ordered.  She will help her mother call to schedule the appointment. She is already had a full 5-year course of alendronate  several years ago.  However if DEXA scan shows significant worsening osteoporosis, we could consider starting Prolia injections for 3 years.

## 2024-02-05 ENCOUNTER — Telehealth: Payer: Self-pay

## 2024-02-05 NOTE — Telephone Encounter (Signed)
 [facility] with [facility] calls nurse line requesting verbal orders for [facility] PT as follows.   1x a week for 6 weeks   Verbal orders given per [facility] protocol.

## 2024-02-15 ENCOUNTER — Other Ambulatory Visit: Payer: Self-pay | Admitting: Family Medicine

## 2024-02-15 DIAGNOSIS — E1140 Type 2 diabetes mellitus with diabetic neuropathy, unspecified: Secondary | ICD-10-CM

## 2024-02-18 ENCOUNTER — Encounter: Payer: Self-pay | Admitting: Gastroenterology

## 2024-02-22 ENCOUNTER — Telehealth: Payer: Self-pay

## 2024-02-22 NOTE — Telephone Encounter (Signed)
 Called patient but unable to leave a message regarding coming at 6:30am on 8-14 instead of 7:00am

## 2024-02-23 NOTE — Telephone Encounter (Signed)
 Called and LM for patient to please plan to arrive at 6:30am on Thursday, Aug 14th for her procedure with Dr. Leigh. She should start her prep 30 minutes earlier

## 2024-02-24 ENCOUNTER — Encounter (HOSPITAL_COMMUNITY): Payer: Self-pay | Admitting: Gastroenterology

## 2024-02-24 ENCOUNTER — Telehealth: Payer: Self-pay | Admitting: Gastroenterology

## 2024-02-24 NOTE — Progress Notes (Addendum)
Attempted to obtain medical history via telephone, unable to reach at this time. Unable to leave voicemail to return pre surgical testing department's phone call,due to mailbox full.  

## 2024-02-24 NOTE — Telephone Encounter (Signed)
 Procedure:Endoscopy/Colonoscopy Procedure date: 03/03/24 Procedure location: [facility] Arrival Time: 6:30 am Spoke with the patient Y/N: Yes Any prep concerns? No  Has the patient obtained the prep from the pharmacy ? Yes Do you have a care partner and transportation: Yes Any additional concerns? No

## 2024-02-25 NOTE — Telephone Encounter (Signed)
 Called but patient's VM is full.  Called patient's daughter, Damien and left detailed message with the information and asked that she relay that to her mom and ask her mom to call us  with any questions or concerns

## 2024-03-02 ENCOUNTER — Telehealth: Payer: Self-pay

## 2024-03-02 ENCOUNTER — Ambulatory Visit

## 2024-03-02 DIAGNOSIS — Z23 Encounter for immunization: Secondary | ICD-10-CM

## 2024-03-02 NOTE — Telephone Encounter (Signed)
 Malori from [employer] calling for OT verbal orders as follows:  1 time(s) weekly for 2 week(s).   Verbal orders given per [facility] protocol  Chiquita JAYSON English, RN

## 2024-03-03 ENCOUNTER — Encounter (HOSPITAL_COMMUNITY)
Admission: RE | Disposition: A | Discharge status: Home / Self Care | Payer: Self-pay | Source: Home / Self Care | Attending: Gastroenterology

## 2024-03-03 ENCOUNTER — Encounter (HOSPITAL_COMMUNITY): Payer: Self-pay | Admitting: Gastroenterology

## 2024-03-03 ENCOUNTER — Ambulatory Visit (HOSPITAL_COMMUNITY): Admitting: Anesthesiology

## 2024-03-03 ENCOUNTER — Ambulatory Visit (HOSPITAL_BASED_OUTPATIENT_CLINIC_OR_DEPARTMENT_OTHER): Admitting: Anesthesiology

## 2024-03-03 ENCOUNTER — Ambulatory Visit (HOSPITAL_COMMUNITY)
Admission: RE | Admit: 2024-03-03 | Discharge: 2024-03-03 | Disposition: A | Discharge status: Home / Self Care | Attending: Gastroenterology | Admitting: Gastroenterology

## 2024-03-03 ENCOUNTER — Other Ambulatory Visit: Payer: Self-pay

## 2024-03-03 DIAGNOSIS — K766 Portal hypertension: Secondary | ICD-10-CM | POA: Diagnosis not present

## 2024-03-03 DIAGNOSIS — K5730 Diverticulosis of large intestine without perforation or abscess without bleeding: Secondary | ICD-10-CM | POA: Diagnosis not present

## 2024-03-03 DIAGNOSIS — I10 Essential (primary) hypertension: Secondary | ICD-10-CM | POA: Diagnosis not present

## 2024-03-03 DIAGNOSIS — Z860100 Personal history of colon polyps, unspecified: Secondary | ICD-10-CM

## 2024-03-03 DIAGNOSIS — I8500 Esophageal varices without bleeding: Secondary | ICD-10-CM | POA: Diagnosis present

## 2024-03-03 DIAGNOSIS — K56609 Unspecified intestinal obstruction, unspecified as to partial versus complete obstruction: Secondary | ICD-10-CM | POA: Diagnosis not present

## 2024-03-03 DIAGNOSIS — I8510 Secondary esophageal varices without bleeding: Secondary | ICD-10-CM | POA: Diagnosis present

## 2024-03-03 DIAGNOSIS — Z1211 Encounter for screening for malignant neoplasm of colon: Secondary | ICD-10-CM | POA: Insufficient documentation

## 2024-03-03 DIAGNOSIS — K449 Diaphragmatic hernia without obstruction or gangrene: Secondary | ICD-10-CM | POA: Insufficient documentation

## 2024-03-03 DIAGNOSIS — Z8673 Personal history of transient ischemic attack (TIA), and cerebral infarction without residual deficits: Secondary | ICD-10-CM | POA: Diagnosis not present

## 2024-03-03 DIAGNOSIS — Z7984 Long term (current) use of oral hypoglycemic drugs: Secondary | ICD-10-CM | POA: Diagnosis not present

## 2024-03-03 DIAGNOSIS — K56699 Other intestinal obstruction unspecified as to partial versus complete obstruction: Secondary | ICD-10-CM | POA: Insufficient documentation

## 2024-03-03 DIAGNOSIS — I868 Varicose veins of other specified sites: Secondary | ICD-10-CM | POA: Insufficient documentation

## 2024-03-03 DIAGNOSIS — J449 Chronic obstructive pulmonary disease, unspecified: Secondary | ICD-10-CM | POA: Diagnosis not present

## 2024-03-03 DIAGNOSIS — K648 Other hemorrhoids: Secondary | ICD-10-CM | POA: Insufficient documentation

## 2024-03-03 DIAGNOSIS — K7460 Unspecified cirrhosis of liver: Secondary | ICD-10-CM

## 2024-03-03 DIAGNOSIS — E119 Type 2 diabetes mellitus without complications: Secondary | ICD-10-CM | POA: Diagnosis not present

## 2024-03-03 DIAGNOSIS — K649 Unspecified hemorrhoids: Secondary | ICD-10-CM

## 2024-03-03 DIAGNOSIS — K3189 Other diseases of stomach and duodenum: Secondary | ICD-10-CM | POA: Insufficient documentation

## 2024-03-03 HISTORY — PX: GASTRIC VARICES BANDING: SHX5519

## 2024-03-03 HISTORY — PX: COLONOSCOPY: SHX5424

## 2024-03-03 HISTORY — PX: ESOPHAGOGASTRODUODENOSCOPY: SHX5428

## 2024-03-03 LAB — GLUCOSE, CAPILLARY: Glucose-Capillary: 118 mg/dL — ABNORMAL HIGH (ref 70–99)

## 2024-03-03 SURGERY — EGD (ESOPHAGOGASTRODUODENOSCOPY)
Anesthesia: Monitor Anesthesia Care

## 2024-03-03 MED ORDER — SODIUM CHLORIDE 0.9 % IV SOLN
INTRAVENOUS | Status: AC | PRN
  Administered 2024-03-03: 500 mL via INTRAMUSCULAR

## 2024-03-03 MED ORDER — PROPOFOL 500 MG/50ML IV EMUL
INTRAVENOUS | Status: DC | PRN
  Administered 2024-03-03: 30 mg via INTRAVENOUS
  Administered 2024-03-03: 180 ug/kg/min via INTRAVENOUS

## 2024-03-03 MED ORDER — PROPOFOL 500 MG/50ML IV EMUL
INTRAVENOUS | Status: AC
  Filled 2024-03-03: qty 100

## 2024-03-03 MED ORDER — SODIUM CHLORIDE 0.9 % IV SOLN: INTRAVENOUS | Status: DC

## 2024-03-03 MED ORDER — PHENYLEPHRINE 80 MCG/ML (10ML) SYRINGE FOR IV PUSH (FOR BLOOD PRESSURE SUPPORT)
PREFILLED_SYRINGE | INTRAVENOUS | Status: DC | PRN
  Administered 2024-03-03: 80 ug via INTRAVENOUS

## 2024-03-03 NOTE — Anesthesia Postprocedure Evaluation (Signed)
 Anesthesia Post Note  Patient: [name]  Procedure(s) Performed: EGD (ESOPHAGOGASTRODUODENOSCOPY) BAND LIGATION, GASTRIC VARICES COLONOSCOPY     Patient location during evaluation: Endoscopy Anesthesia Type: MAC Level of consciousness: awake Pain management: pain level controlled Vital Signs Assessment: post-procedure vital signs reviewed and stable Respiratory status: spontaneous breathing, nonlabored ventilation and respiratory function stable Cardiovascular status: blood pressure returned to baseline and stable Postop Assessment: no apparent nausea or vomiting Anesthetic complications: no   No notable events documented.  Last Vitals:  Vitals:   03/03/24 0905 03/03/24 0910  BP:  139/63  Pulse: 69 70  Resp: (!) 21 (!) 22  Temp:    SpO2: 96% 95%    Last Pain:  Vitals:   03/03/24 0910  TempSrc:   PainSc: 0-No pain                 Nanako Stopher P Jon Lall

## 2024-03-03 NOTE — Discharge Instructions (Signed)

## 2024-03-03 NOTE — Op Note (Signed)
 [facility] Patient Name: [name] Procedure Date: 03/03/2024 MRN: [id number] Attending MD: Elspeth SQUIBB. Leigh , MD, [id number] Date of Birth: April 22, 1956 CSN: [id number] Age: 68 Admit Type: Outpatient Procedure:                Colonoscopy Indications:              High risk colon cancer surveillance: Personal                            history of colonic polyps - last exam 2021, polyps                            removed but limited due to poor prep. Double prep                            used for this exam. Providers:                Elspeth SQUIBB. Leigh, MD, Willy Hummer, RN, Felice Sar, Technician Referring MD:              Medicines:                Monitored Anesthesia Care Complications:            No immediate complications. Estimated blood loss:                            Minimal. Estimated Blood Loss:     Estimated blood loss was minimal. Procedure:                Pre-Anesthesia Assessment:                           - Prior to the procedure, a History and Physical                            was performed, and patient medications and                            allergies were reviewed. The patient's tolerance of                            previous anesthesia was also reviewed. The risks                            and benefits of the procedure and the sedation                            options and risks were discussed with the patient.                            All questions were answered, and informed consent  was obtained. Prior Anticoagulants: The patient has                            taken no anticoagulant or antiplatelet agents. ASA                            Grade Assessment: III - A patient with severe                            systemic disease. After reviewing the risks and                            benefits, the patient was deemed in satisfactory                            condition to  undergo the procedure.                           After obtaining informed consent, the colonoscope                            was passed under direct vision. Throughout the                            procedure, the patient's blood pressure, pulse, and                            oxygen saturations were monitored continuously. The                            PCF-HQ190DL ([id number]) Olympus colonoscope was                            introduced through the anus with the intention of                            advancing to the cecum. The scope was advanced to                            the sigmoid colon before the procedure was aborted.                            Medications were given. The PCF-H190TL ([id number])                            Olympus Colonoscope was introduced through the anus                            with the intention of advancing to the cecum. The                            scope was advanced to the descending colon before  the procedure was aborted. Medications were given.                            The colonoscopy was technically difficult and                            complex due to restricted mobility of the colon.                            The patient tolerated the procedure well. The                            quality of the bowel preparation was poor. The                            rectum was photographed. Scope In: 8:23:29 AM Scope Out: 8:38:03 AM Total Procedure Duration: 0 hours 14 minutes 34 seconds  Findings:      The perianal and digital rectal examinations were normal.      There is restricted mobility of the left colon with a benign-appearing,       intrinsic stenosis was found in the distal sigmoid colon. Regular       pediatric colonoscope could not traverse this, water immersion used.       Pediatric colonoscope removed and ultraslim pediatric colonoscope       placed. This was able to slowly traverse the restricted sigmoid colon.       A few small-mouthed diverticula were found in the left colon.      Unfortunately proximal to the restricted area in the left colon, a large       amount of semi-solid stool was found in the sigmoid colon and in the       descending colon, interfering with visualization. The procedure was       aborted at this point due to inadequate prep.      Rectal varices were found.      Internal hemorrhoids were found.      The exam was otherwise without abnormality of what was visualized. Impression:               - Preparation of the colon was poor, procedure                            aborted in the left colon.                           - Severely restricted mobility of the left colon,                            requiring use of ultraslim pediatric colonoscope to                            traverse this area.                           - Diverticulosis in the left colon.                           -  Rectal varices.                           - Internal hemorrhoids.                           Incomplete / aborted procedure due to prep. She                            also has a very restricted sigmoid colon with                            difficulty traversing this area. Will discuss with                            her if she wishes to consider a repeat attempt at                            some point (would be a triple prep?) vs. forgoing                            further procedures. Moderate Sedation:      No moderate sedation, case performed with MAC Recommendation:           - Patient has a contact number available for                            emergencies. The signs and symptoms of potential                            delayed complications were discussed with the                            patient. Return to normal activities tomorrow.                            Written discharge instructions were provided to the                            patient.                           - Resume previous  diet.                           - Continue present medications.                           - Will discuss options with her moving forward in                            light of aborted procedure, above. Procedure Code(s):        --- Professional ---                           [phone], 53, Colonoscopy,  flexible; diagnostic,                            including collection of specimen(s) by brushing or                            washing, when performed (separate procedure) Diagnosis Code(s):        --- Professional ---                           K64.8, Other hemorrhoids                           Z86.010, Personal history of colonic polyps                           K56.699, Other intestinal obstruction unspecified                            as to partial versus complete obstruction                           K57.30, Diverticulosis of large intestine without                            perforation or abscess without bleeding CPT copyright 2022 American Medical Association. All rights reserved. The codes documented in this report are preliminary and upon coder review may  be revised to meet current compliance requirements. Elspeth P. Baillie Mohammad, MD 03/03/2024 8:56:35 AM This report has been signed electronically. Number of Addenda: 0

## 2024-03-03 NOTE — Transfer of Care (Signed)
 Immediate Anesthesia Transfer of Care Note  Patient: [name]  Procedure(s) Performed: EGD (ESOPHAGOGASTRODUODENOSCOPY) BAND LIGATION, GASTRIC VARICES COLONOSCOPY  Patient Location: PACU  Anesthesia Type:MAC  Level of Consciousness: sedated  Airway & Oxygen Therapy: Patient Spontanous Breathing  Post-op Assessment: Report given to RN  Post vital signs: Reviewed and stable  Last Vitals:  Vitals Value Taken Time  BP 120/70 03/03/24 08:45  Temp    Pulse 83 03/03/24 08:46  Resp 21 03/03/24 08:46  SpO2 96 % 03/03/24 08:46  Vitals shown include unfiled device data.  Last Pain:  Vitals:   03/03/24 0732  TempSrc: Temporal  PainSc: 8          Complications: No notable events documented.

## 2024-03-03 NOTE — Op Note (Signed)
 [facility] Patient Name: [name] Procedure Date: 03/03/2024 MRN: [id number] Attending MD: Elspeth SQUIBB. Leigh , MD, [id number] Date of Birth: 1956/01/14 CSN: [id number] Age: 68 Admit Type: Outpatient Procedure:                Upper GI endoscopy Indications:              For therapy of esophageal varices - banding done                            6/29 for large varices, she is not a candidate for                            nonselective beta blockade (COPD), repeat EGD to                            further band as needed Providers:                Elspeth SQUIBB. Leigh, MD, Willy Hummer, RN, Felice Sar, Technician Referring MD:              Medicines:                Monitored Anesthesia Care Complications:            No immediate complications. Estimated blood loss:                            Minimal. Estimated Blood Loss:     Estimated blood loss was minimal. Procedure:                Pre-Anesthesia Assessment:                           - Prior to the procedure, a History and Physical                            was performed, and patient medications and                            allergies were reviewed. The patient's tolerance of                            previous anesthesia was also reviewed. The risks                            and benefits of the procedure and the sedation                            options and risks were discussed with the patient.                            All questions were answered, and informed consent  was obtained. Prior Anticoagulants: The patient has                            taken no anticoagulant or antiplatelet agents. ASA                            Grade Assessment: III - A patient with severe                            systemic disease. After reviewing the risks and                            benefits, the patient was deemed in satisfactory                             condition to undergo the procedure.                           After obtaining informed consent, the endoscope was                            passed under direct vision. Throughout the                            procedure, the patient's blood pressure, pulse, and                            oxygen saturations were monitored continuously. The                            GIF-H190 ([id number]) Olympus endoscope was introduced                            through the mouth, and advanced to the second part                            of duodenum. The upper GI endoscopy was                            accomplished without difficulty. The patient                            tolerated the procedure well. Scope In: Scope Out: Findings:      The Z-line was regular and was found 37 cm from the incisors.      A small hiatal hernia was present.      2 columns of medium sized varices were found in the middle third of the       esophagus and in the lower third of the esophagus. Stigmata of prior       banding seen / scarring. Three bands were successfully placed (distal to       mid esophagus), resulting in deflation of varices.      The exam of the esophagus was otherwise normal.      Portal hypertensive gastropathy was found in  the entire examined stomach.      The exam of the stomach was otherwise normal.      The examined duodenum was normal. Impression:               - Z-line regular, 37 cm from the incisors.                           - Small hiatal hernia.                           - Medium sized varices - with scarring from prior                            banding, improved compared to the last exam. Banded                            x 3.                           - Portal hypertensive gastropathy.                           - Normal examined duodenum. Moderate Sedation:      No moderate sedation, case performed with MAC Recommendation:           - Patient has a contact number available for                             emergencies. The signs and symptoms of potential                            delayed complications were discussed with the                            patient. Return to normal activities tomorrow.                            Written discharge instructions were provided to the                            patient.                           - Advance diet as tolerated - liquids this AM, soft                            tonight and for the next few days                           - Continue present medications.                           - Repeat upper endoscopy in 1-2 months for further                            treatment / complete  eradication Procedure Code(s):        --- Professional ---                           [phone], Esophagogastroduodenoscopy, flexible,                            transoral; with band ligation of esophageal/gastric                            varices Diagnosis Code(s):        --- Professional ---                           K44.9, Diaphragmatic hernia without obstruction or                            gangrene                           I85.00, Esophageal varices without bleeding                           K76.6, Portal hypertension                           K31.89, Other diseases of stomach and duodenum CPT copyright 2022 American Medical Association. All rights reserved. The codes documented in this report are preliminary and upon coder review may  be revised to meet current compliance requirements. Elspeth P. Chandelle Harkey, MD 03/03/2024 9:03:57 AM This report has been signed electronically. Number of Addenda: 0

## 2024-03-03 NOTE — H&P (Signed)
 [facility] History and Physical   Primary Care Physician:  Howell Lunger, DO   Reason for Procedure:   Treatment of esophageal varices, history of colon polyps  Plan:    EGD with banding of varices if needed and colonoscopy     HPI: [name] is a 68 y.o. female  with cirrhosis. History of large varices, has COPD and on inhalers, not a good candidate for beta blockade, so treated varices with banding. Had large varices on last EGD, 5 bands placed 01/17/24. She tolerated it well. Here for repeat EGD to reassess after banding and treat further if needed.  Otherwise overdue for surveillance colonoscopy. History of polyps, last exam in 2021 limited by poor prep. Colonoscopy to further evaluate.   Otherwise feels well without any cardiopulmonary symptoms. She does endorse some left foot pain ongoing for a few weeks, which has some erythema. She is scheduled to see her PCP to further evaluate that.  I have discussed risks / benefits of anesthesia and endoscopic procedure with Ronal LITTIE Sake and they wish to proceed with the exams as outlined today.    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   Osteoporosis    in lower back per pt    Partial small bowel obstruction (HCC) 01/30/2020   Plantar fasciitis    Snoring 08/28/2017   Stroke (HCC) 15 years ago    residual left sided weakness   Subacute maxillary sinusitis 05/19/2020   Tremors of nervous system    Uterovaginal prolapse, incomplete 06/10/2010   Qualifier: Diagnosis of  By: Gerlene NP, Devere     Vertigo     Past  Surgical History:  Procedure Laterality Date   APPENDECTOMY     BLADDER SURGERY     x2   CATARACT EXTRACTION W/PHACO Left 05/06/2017   Procedure: CATARACT EXTRACTION PHACO AND INTRAOCULAR LENS PLACEMENT (IOC) LEFT DIABETIC;  Surgeon: Mittie Gaskin, MD;  Location: [facility];  Service: Ophthalmology;  Laterality: Left;  Diabetic - oral meds   CATARACT EXTRACTION W/PHACO Right 06/03/2017   Procedure: CATARACT EXTRACTION PHACO AND INTRAOCULAR LENS PLACEMENT (IOC) RIGHT DIABETIC;  Surgeon: Mittie Gaskin, MD;  Location: [facility];  Service: Ophthalmology;  Laterality: Right;  Diabetic - oral meds   CHOLECYSTECTOMY     COLONOSCOPY  03/14/2016   ESOPHAGOGASTRODUODENOSCOPY N/A 01/17/2024   Procedure: EGD (ESOPHAGOGASTRODUODENOSCOPY);  Surgeon: Leigh Elspeth SQUIBB, MD;  Location: [facility];  Service: Gastroenterology;  Laterality: N/A;   FOOT SURGERY     IR PARACENTESIS  09/17/2022   KYPHOPLASTY N/A 04/07/2019   Procedure: L4 KYPHOPLASTY;  Surgeon: Kathlynn Sharper, MD;  Location: [facility] ORS;  Service: Orthopedics;  Laterality: N/A;   KYPHOPLASTY N/A 10/13/2023   Procedure: KYPHOPLASTY LUMBAR FIVE;  Surgeon: Debby Dorn MATSU, MD;  Location: [facility] OR;  Service: Neurosurgery;  Laterality: N/A;   TOTAL ABDOMINAL HYSTERECTOMY     TOTAL KNEE ARTHROPLASTY Left 09/21/2018   Procedure: TOTAL KNEE ARTHROPLASTY-LEFT NEEDS RNFA;  Surgeon: Kathlynn Sharper, MD;  Location: [facility] ORS;  Service: Orthopedics;  Laterality: Left;    Prior to Admission medications   Medication Sig Start Date End Date Taking? Authorizing Provider  albuterol  (PROVENTIL ) (2.5 MG/3ML) 0.083% nebulizer solution Take 3 mLs (2.5 mg total) by nebulization every 4 (four) hours as needed for wheezing. 01/09/23   Macario Dorothyann HERO, MD  albuterol  (VENTOLIN  HFA) 108 [phone] Base) MCG/ACT inhaler Inhale 1-2 puffs into the lungs every 6 (six) hours as needed for wheezing. 01/09/23   Macario Dorothyann HERO, MD  atorvastatin  (LIPITOR) 10  MG tablet Take 1 tablet (10 mg total) by mouth daily. 10/14/23   Howell Lunger, DO  Calcium  Carbonate-Vitamin D  (CALCIUM -D PO) Take 1 tablet by mouth daily.    [provider]  DULoxetine  (CYMBALTA ) 60 MG capsule TAKE ONE CAPSULE BY MOUTH DAILY 10/14/23   Howell Lunger, DO  ferrous gluconate  [facility]) 324 MG tablet Take 1 tablet (324 mg total) by mouth daily with breakfast. 10/21/22   Macario Dorothyann HERO, MD  furosemide  (LASIX ) 40 MG tablet Take 1 tablet (40 mg total) by mouth daily. Get your blood work checked 2-3 times per year on this medication. 06/23/23   Howell Lunger, DO  gabapentin  (NEURONTIN ) 800 MG tablet TAKE ONE (1) TABLET BY MOUTH TWO (2) TIMES DAILY 10/14/23   Howell Lunger, DO  JARDIANCE  25 MG TABS tablet TAKE ONE (1) TABLET BY MOUTH EVERY DAY 02/16/24   Howell Lunger, DO  linaclotide  (LINZESS ) 290 MCG CAPS capsule Take 1 capsule (290 mcg total) by mouth daily before breakfast. 11/27/23 02/25/24  [place], PA-C  metFORMIN  (GLUCOPHAGE ) 1000 MG tablet TAKE 1 TABLET BY MOUTH TWICE DAILY WITH MEALS 10/14/23   Howell Lunger, DO  methocarbamol  (ROBAXIN ) 500 MG tablet TAKE 1 TABLET BY MOUTH TWICE DAILY AS NEEDED FOR MUSCLE SPASMS 01/19/24   Howell Lunger, DO  Multiple Vitamin (MULTIVITAMIN WITH MINERALS) TABS tablet Take 1 tablet by mouth daily.    [provider]  naloxone [facility]) nasal spray 4 mg/0.1 mL Place 4 mg into the nose as needed (opioid overdose). 04/04/22   [provider]  oxyCODONE  (OXY IR/ROXICODONE ) 5 MG immediate release tablet Take 1 tablet (5 mg total) by mouth every 4 (four) hours as needed for moderate pain (pain score 4-6). 01/19/24   Rosalynn Camie CROME, MD  pantoprazole  (PROTONIX ) 40 MG tablet Take 1 tablet (40 mg total) by mouth daily. 02/01/24 04/01/24  Howell Lunger, DO  Rectal Protectant-Emollient (CALMOL-4) 76-10 % SUPP Use as directed once to twice daily 01/29/24   Ghalia Reicks, Elspeth SQUIBB, MD  senna (SENOKOT) 8.6 MG TABS tablet Take 1 tablet by  mouth daily.    [provider]  spironolactone  (ALDACTONE ) 100 MG tablet TAKE ONE (1) TABLET BY MOUTH EVERY DAY 06/22/23   Howell Lunger, DO    Current Facility-Administered Medications  Medication Dose Route Frequency Provider Last Rate Last Admin   0.9 %  sodium chloride  infusion   Intravenous Continuous Roneshia Drew, Elspeth SQUIBB, MD        Allergies as of 01/29/2024 - Review Complete 01/29/2024  Allergen Reaction Noted   Hydrocodone -acetaminophen  Nausea And Vomiting 04/29/2017   Pregabalin  Nausea And Vomiting 02/25/2021    Family History  Problem Relation Age of Onset   Healthy Mother    Heart disease Father    Alcohol  abuse Father    Heart disease Sister    Diabetes Sister    Alcohol  abuse Brother    Breast cancer Daughter    Colon polyps Neg Hx    Colon cancer  Neg Hx    Esophageal cancer Neg Hx    Rectal cancer Neg Hx    Stomach cancer Neg Hx    Pancreatic cancer Neg Hx     Social History   Socioeconomic History   Marital status: Single    Spouse name: Not on file   Number of children: 4   Years of education: HS   Highest education level: Not on file  Occupational History   Occupation: Unemployed  Tobacco Use   Smoking status: Never    Passive exposure: Never   Smokeless tobacco: Never  Vaping Use   Vaping status: Never Used  Substance and Sexual Activity   Alcohol  use: No   Drug use: No   Sexual activity: Not on file  Other Topics Concern   Not on file  Social History Narrative   Lives at home alone.   Right-handed.   Rarely uses caffeine.   Social Drivers of Corporate investment banker Strain: Low Risk  (08/05/2019)   Received from [facility]   Overall Financial Resource Strain (CARDIA)  Food Insecurity: No Food Insecurity (01/17/2024)   Hunger Vital Sign    Worried About Running Out of Food in the Last Year: Never true    Ran Out of Food in the Last Year: Never true  Transportation Needs: No Transportation Needs  (01/17/2024)   PRAPARE - Administrator, Civil Service (Medical): No    Lack of Transportation (Non-Medical): No  Physical Activity: Sufficiently Active (08/05/2019)   Received [facility]   Exercise Vital Sign    On average, how many days per week do you engage in moderate to strenuous exercise (like a brisk walk)?: 7 days    On average, how many minutes do you engage in exercise at this level?: 30 min  Stress: No Stress Concern Present (08/05/2019)   Received from [facility] - Occupational Stress Questionnaire    Feeling of Stress : Not at all  Social Connections: Socially Isolated (01/17/2024)   Social Connection and Isolation Panel    Frequency of Communication with Friends and Family: Three times a week    Frequency of Social Gatherings with Friends and Family: Once a week    Attends Religious Services: Never    Database administrator or Organizations: No    Attends Banker Meetings: Never    Marital Status: Widowed  Intimate Partner Violence: Not At Risk (01/17/2024)   Humiliation, Afraid, Rape, and Kick questionnaire    Fear of Current or Ex-Partner: No    Emotionally Abused: No    Physically Abused: No    Sexually Abused: No    Review of Systems: All other review of systems negative except as mentioned in the HPI.  Physical Exam: Vital signs There were no vitals taken for this visit.  General:   Alert,  Well-developed, pleasant and cooperative in NAD Lungs:  Clear throughout to auscultation.   Heart:  Regular rate and rhythm Abdomen:  Soft, nontender and nondistended.  protuberant Neuro/Psych:  Alert and cooperative. Normal mood and affect. A and O x 3  Marcey Naval, MD [facility]

## 2024-03-03 NOTE — Anesthesia Preprocedure Evaluation (Addendum)
 Anesthesia Evaluation  Patient identified by MRN, date of birth, ID band Patient awake    Reviewed: Allergy & Precautions, NPO status , Patient's Chart, lab work & pertinent test results  Airway Mallampati: II  TM Distance: >3 FB Neck ROM: Full    Dental  (+) Edentulous Lower, Lower Dentures   Pulmonary COPD,  COPD inhaler   Pulmonary exam normal        Cardiovascular hypertension, Normal cardiovascular exam Rate:Normal     Neuro/Psych  PSYCHIATRIC DISORDERS     Dementia CVA    GI/Hepatic negative GI ROS, Neg liver ROS,,,  Endo/Other  diabetes, Oral Hypoglycemic Agents    Renal/GU negative Renal ROS     Musculoskeletal   Abdominal  (+) + obese  Peds  Hematology negative hematology ROS (+)   Anesthesia Other Findings Cirrhosis Esophageal varices Hx of colon polyps   Reproductive/Obstetrics                              Anesthesia Physical Anesthesia Plan  ASA: 3  Anesthesia Plan: MAC   Post-op Pain Management:    Induction:   PONV Risk Score and Plan: 2 and Propofol  infusion and Treatment may vary due to age or medical condition  Airway Management Planned: Nasal Cannula  Additional Equipment:   Intra-op Plan:   Post-operative Plan:   Informed Consent: I have reviewed the patients History and Physical, chart, labs and discussed the procedure including the risks, benefits and alternatives for the proposed anesthesia with the patient or authorized representative who has indicated his/her understanding and acceptance.       Plan Discussed with: CRNA  Anesthesia Plan Comments:          Anesthesia Quick Evaluation

## 2024-03-07 ENCOUNTER — Encounter (HOSPITAL_COMMUNITY): Payer: Self-pay | Admitting: Gastroenterology

## 2024-03-07 ENCOUNTER — Telehealth: Payer: Self-pay

## 2024-03-07 NOTE — Telephone Encounter (Signed)
-----   Message from [facility] H sent at 03/04/2024  4:37 PM EDT ----- Regarding: schedule EGD in Oct at [facility] Needs repeat EGD at hosp

## 2024-03-07 NOTE — Telephone Encounter (Signed)
 Called and left message for patient that [name] has an opening for her repeat EGD at [facility] on October 14th. Asked her to call back to confirm if she can do this date

## 2024-03-08 ENCOUNTER — Ambulatory Visit (INDEPENDENT_AMBULATORY_CARE_PROVIDER_SITE_OTHER): Admitting: Student

## 2024-03-08 ENCOUNTER — Encounter: Payer: Self-pay | Admitting: Student

## 2024-03-08 VITALS — BP 139/80 | HR 89 | Ht 64.0 in | Wt 192.2 lb

## 2024-03-08 DIAGNOSIS — F39 Unspecified mood [affective] disorder: Secondary | ICD-10-CM | POA: Diagnosis not present

## 2024-03-08 DIAGNOSIS — E1140 Type 2 diabetes mellitus with diabetic neuropathy, unspecified: Secondary | ICD-10-CM | POA: Diagnosis not present

## 2024-03-08 DIAGNOSIS — T148XXA Other injury of unspecified body region, initial encounter: Secondary | ICD-10-CM

## 2024-03-08 DIAGNOSIS — G894 Chronic pain syndrome: Secondary | ICD-10-CM

## 2024-03-08 MED ORDER — DULOXETINE HCL 60 MG PO CPEP: 60.0000 mg | ORAL_CAPSULE | Freq: Every day | ORAL | 3 refills | Status: AC

## 2024-03-08 MED ORDER — OXYCODONE HCL 5 MG PO TABS: 5.0000 mg | ORAL_TABLET | ORAL | 0 refills | Status: AC | PRN

## 2024-03-08 NOTE — Assessment & Plan Note (Signed)
 Patient previously established pain contract with our clinic.  Had not needed oxycodone  for quite some time, however with worsening neuropathy (in the setting of not taking her Cymbalta ) her pain has been hard to control.  She is requesting oxycodone  today.  We discussed risks/benefits of continuing opioid medication. She will need to follow monthly for refills, and we will keep supply to 20 pills per month as this has previously controlled her pain quite well.  Additionally she understands that we will continue to taper this medication as much as possible. - Oxycodone  5 mg every 4 hours as needed, 20 pill supply - Continue gabapentin  800 mg twice daily

## 2024-03-08 NOTE — Patient Instructions (Addendum)
 It was great to see you! Thank you for allowing me to participate in your care!   I recommend that you always bring your medications to each appointment as this makes it easy to ensure we are on the correct medications and helps us  not miss when refills are needed.  Our plans for today:  - You can take Oxycodone  5 mg every 4 hours as needed for pain, you have a limited supply of 20 pills for the month. - Please start taking your Duloxetine  (cymbalta ) 60 mg daily - You have a hematoma on your leg, this can take several weeks to fully heal  We are checking some labs today, I will call you if they are abnormal will send you a MyChart message or a letter if they are normal.  If you do not hear about your labs in the next 2 weeks please let us  know.  Take care and seek immediate care sooner if you develop any concerns. Please remember to show up 15 minutes before your scheduled appointment time!  Gladis Church, DO [facility]

## 2024-03-08 NOTE — Assessment & Plan Note (Signed)
-   Continue metformin /Jardiance  - Restart Cymbalta  60 mg daily - UACR today

## 2024-03-08 NOTE — Progress Notes (Signed)
    SUBJECTIVE:   CHIEF COMPLAINT / HPI:   Diabetes  Neuropathy  Reports adherence with Jardiance  and metformin , no side effects.  Interestingly, patient found duloxetine  bottle behind her headboard-reports that she is limiting this for several months.  She also had noted worsening neuropathy.  Recommended restarting duloxetine .  Left leg trauma Patient was seen on 02/01/2024 for injury to leg.  She fell, and led to hematoma formation.  X-rays obtained, no fracture.  Hematoma still present, bruising has improved some.  She reports some redness of her leg, which is since subsided.  Chronic pain Patient presents to discuss pain regimen for her chronic pain.  She is requesting refills of oxycodone .   OBJECTIVE:   BP 139/80   Pulse 89   Ht 5' 4 (1.626 m)   Wt 192 lb 3.2 oz (87.2 kg)   SpO2 97%   BMI 32.99 kg/m    General: NAD, pleasant Cardio: RRR, no MRG. Cap Refill <2s. Respiratory: CTAB, normal wob on RA Skin: Warm and dry  ASSESSMENT/PLAN:   Assessment & Plan Type 2 diabetes mellitus with diabetic neuropathy, without long-term current use of insulin  (HCC) - Continue metformin /Jardiance  - Restart Cymbalta  60 mg daily - UACR today Chronic pain syndrome Patient previously established pain contract with our clinic.  Had not needed oxycodone  for quite some time, however with worsening neuropathy (in the setting of not taking her Cymbalta ) her pain has been hard to control.  She is requesting oxycodone  today.  We discussed risks/benefits of continuing opioid medication. She will need to follow monthly for refills, and we will keep supply to 20 pills per month as this has previously controlled her pain quite well.  Additionally she understands that we will continue to taper this medication as much as possible. - Oxycodone  5 mg every 4 hours as needed, 20 pill supply - Continue gabapentin  800 mg twice daily Hematoma of skin Hematoma skin present.  No evidence of cellulitis.   Weightbearing.  Will likely take several weeks to heal, particularly with vascular congestion from cirrhosis.    Gladis Church, DO [facility]

## 2024-03-09 LAB — MICROALBUMIN / CREATININE URINE RATIO
Creatinine, Urine: 60.9 mg/dL
Microalb/Creat Ratio: 8 mg/g{creat} (ref 0–29)
Microalbumin, Urine: 5 ug/mL

## 2024-03-10 ENCOUNTER — Ambulatory Visit: Payer: Self-pay | Admitting: Student

## 2024-03-10 ENCOUNTER — Other Ambulatory Visit: Payer: Self-pay

## 2024-03-10 DIAGNOSIS — I8500 Esophageal varices without bleeding: Secondary | ICD-10-CM

## 2024-03-10 DIAGNOSIS — K7460 Unspecified cirrhosis of liver: Secondary | ICD-10-CM

## 2024-03-10 NOTE — Telephone Encounter (Signed)
 Called and LM for patient to call back and confirm EGD at [facility] on Tuesday, 10-14

## 2024-03-10 NOTE — Telephone Encounter (Signed)
 Patient called back and confirmed 10-14. PV made for late Sept

## 2024-03-10 NOTE — Progress Notes (Signed)
 Patient scheduled for EGD on 10-14 at 7:30am.

## 2024-03-15 ENCOUNTER — Telehealth: Payer: Self-pay

## 2024-03-15 NOTE — Telephone Encounter (Signed)
-----   Message from [facility] [place] H sent at 03/10/2024 11:48 AM EDT ----- Regarding: EGD on 10-14 and PV Mail letter to patient with appointment info and PV info

## 2024-03-15 NOTE — Telephone Encounter (Signed)
 Letter mailed to patient with appointment info for upcoming EGD and PV

## 2024-03-20 ENCOUNTER — Other Ambulatory Visit: Payer: Self-pay | Admitting: Student

## 2024-04-04 ENCOUNTER — Ambulatory Visit

## 2024-04-04 ENCOUNTER — Other Ambulatory Visit: Payer: Self-pay

## 2024-04-04 DIAGNOSIS — Z23 Encounter for immunization: Secondary | ICD-10-CM | POA: Diagnosis not present

## 2024-04-19 ENCOUNTER — Telehealth: Payer: Self-pay

## 2024-04-19 ENCOUNTER — Encounter

## 2024-04-19 NOTE — Telephone Encounter (Signed)
 [name] thanks for letting me know  - if you can't get ahold of her by the end of the week I would cancel her and we can put someone else in the that slot.  Jan - if this patient does not keep her appointment with me, can add Tina's patient whom we recently discussed, if she does not get done sooner. Thanks

## 2024-04-19 NOTE — Telephone Encounter (Signed)
 Multiple attempts made to reach patient by two RN from different phones. Phone rings and forwards directly to a busy signal. I am unable to leave her a VM. If she calls back please make sure her number is correct in the system or she has unknown callers unblocked. Otherwise patient will need to complete PV via video call or in person. I will send her a mychart message making her aware she missed her apt.

## 2024-04-19 NOTE — Telephone Encounter (Signed)
 Good afternoon,  Just forwarding so that everyone is aware. Pt no show for PV apt today. Unable to get thru to her via phone. I have sent her a mychart message making her aware that she needs to call and r/ or her procedure will be cancelled.

## 2024-04-20 NOTE — Telephone Encounter (Signed)
 Patient has not read her MyChart message.  Called patient but went straight to voice mail. Called her alternate contact, son, [name].  Asked him to have her call us 

## 2024-04-21 ENCOUNTER — Other Ambulatory Visit: Payer: Self-pay | Admitting: Student

## 2024-04-21 NOTE — Telephone Encounter (Signed)
 Called and left message for patient that she missed her pre visit for her EGD with [facility] on 10-14.  Asked that she call back asap to get rescheduled so she is prepared for her procedure.

## 2024-04-25 ENCOUNTER — Telehealth: Payer: Self-pay

## 2024-04-26 ENCOUNTER — Telehealth: Payer: Self-pay

## 2024-04-26 ENCOUNTER — Encounter (HOSPITAL_COMMUNITY): Payer: Self-pay | Admitting: Gastroenterology

## 2024-04-26 NOTE — Telephone Encounter (Signed)
 Spoke with pt and she states she cannot come for the appt on 10/14 as her car is in the shop. She will call back to reschedule when car fixed.

## 2024-04-26 NOTE — Telephone Encounter (Signed)
 Procedure:EGD Procedure date: 05/03/24 Procedure location: [facility] Arrival Time: 6:00 Spoke with the patient Y/N: Y Any prep concerns? N  Has the patient obtained the prep from the pharmacy ? N Do you have a care partner and transportation: N Any additional concerns? Y   I called patient about the up coming procedure, pt stated that she needed to cancelled her procedure due to no transportation, pt stated her car was in the shop and would not be ready in time for procedure. I gave the patient the office number and told her to call and ask for Rock or Dottie to inform the nurse of her cancellation.

## 2024-04-26 NOTE — Telephone Encounter (Signed)
 Sorry to hear this - [name] - FYI - not sure if we have anyone waiting for a hospital procedure, a few slots have opened up

## 2024-04-26 NOTE — Telephone Encounter (Signed)
 Appt cancelled as requested.

## 2024-05-03 ENCOUNTER — Encounter (HOSPITAL_COMMUNITY): Payer: Self-pay

## 2024-05-03 ENCOUNTER — Ambulatory Visit (HOSPITAL_COMMUNITY): Admit: 2024-05-03 | Admitting: Gastroenterology

## 2024-05-03 SURGERY — EGD (ESOPHAGOGASTRODUODENOSCOPY)
Anesthesia: Monitor Anesthesia Care

## 2024-05-16 ENCOUNTER — Other Ambulatory Visit: Payer: Self-pay | Admitting: Student

## 2024-06-22 ENCOUNTER — Telehealth: Payer: Self-pay

## 2024-06-22 DIAGNOSIS — K7460 Unspecified cirrhosis of liver: Secondary | ICD-10-CM

## 2024-06-22 NOTE — Telephone Encounter (Signed)
Orders are entered

## 2024-06-22 NOTE — Telephone Encounter (Signed)
-----   Message from [facility] H sent at 01/29/2024 12:52 PM EDT ----- Regarding: due for RUQ and labs Patient will be due for RUQ U/S and labs in Dec  Cbc, cmet, pt/inr and AFP

## 2024-06-22 NOTE — Telephone Encounter (Signed)
 Called and LM for patient that she is due for RUQ U/S and labs for her cirrhosis. Asked her to call back to see where and when she would like to have those done.  If she wants to go to [facility] I can put the labs in so she can have them drawn when she goes for her U/S.   Reminded her that she needs an EGD at [facility] for banding of varices which was cancelled in October

## 2024-06-27 NOTE — Telephone Encounter (Signed)
 Patient returning call Requesting a call back  Please advise thank you

## 2024-06-27 NOTE — Telephone Encounter (Signed)
 Called and Left another message to call back to discuss

## 2024-06-29 ENCOUNTER — Other Ambulatory Visit: Payer: Self-pay

## 2024-06-29 DIAGNOSIS — K7460 Unspecified cirrhosis of liver: Secondary | ICD-10-CM

## 2024-06-29 DIAGNOSIS — I8500 Esophageal varices without bleeding: Secondary | ICD-10-CM

## 2024-06-29 NOTE — Addendum Note (Signed)
 Addended by: Ugochukwu Chichester M on: 06/29/2024 04:26 PM   Modules accepted: Orders

## 2024-06-29 NOTE — Addendum Note (Signed)
 Addended by: Terre Hanneman M on: 06/29/2024 03:58 PM   Modules accepted: Orders

## 2024-06-29 NOTE — Progress Notes (Signed)
 Scheduled patient for EGD with esophageal banding of varices on Monday, Feb 16th at [facility].  Procedure at 7:30am Case# [id number]

## 2024-06-29 NOTE — Telephone Encounter (Signed)
 Patient called and was given the number for [facility] Scheduling to reschedule her U/S. She needs a sooner date.  She understands to have labs done when she is there. She confirmed she can come for EGD with screening of varices at [facility] on 09-05-24. She knows to arrive at [facility] for 7:30am procedure.  She did not want a PV.  I will make myself a note to mail her instructions once we get closer.

## 2024-06-29 NOTE — Telephone Encounter (Signed)
 Called and LM for patient that I will schedule her RUQ  U/S at [facility] and she can have her labs done there when she goes. Dr. Leigh has an appointment at [facility] on 2-16 and I will get there scheduled for an EGD for esophageal varices screening. .  U/S scheduled for [name] on Tuesday, Dec 23rd at 9:30am arr at 9:15am and NPO 6 hours.   EGD with banding of varices at [facility] scheduled for Monday 2-16 at 7:30 am.  Patient will be scheduled for PV in early February closer to the procedure

## 2024-07-04 ENCOUNTER — Ambulatory Visit (HOSPITAL_COMMUNITY): Admission: RE | Admit: 2024-07-04 | Discharge: 2024-07-04 | Attending: Gastroenterology

## 2024-07-04 ENCOUNTER — Ambulatory Visit: Payer: Self-pay | Admitting: Gastroenterology

## 2024-07-04 ENCOUNTER — Other Ambulatory Visit (HOSPITAL_COMMUNITY)
Admission: RE | Admit: 2024-07-04 | Discharge: 2024-07-04 | Disposition: A | Source: Ambulatory Visit | Attending: Gastroenterology | Admitting: Gastroenterology

## 2024-07-04 DIAGNOSIS — K7460 Unspecified cirrhosis of liver: Secondary | ICD-10-CM

## 2024-07-04 LAB — CBC WITH DIFFERENTIAL/PLATELET
Abs Immature Granulocytes: 0.01 K/uL (ref 0.00–0.07)
Basophils Absolute: 0 K/uL (ref 0.0–0.1)
Basophils Relative: 1 %
Eosinophils Absolute: 0.2 K/uL (ref 0.0–0.5)
Eosinophils Relative: 4 %
HCT: 37.6 % (ref 36.0–46.0)
Hemoglobin: 12.8 g/dL (ref 12.0–15.0)
Immature Granulocytes: 0 %
Lymphocytes Relative: 28 %
Lymphs Abs: 1.3 K/uL (ref 0.7–4.0)
MCH: 33.2 pg (ref 26.0–34.0)
MCHC: 34 g/dL (ref 30.0–36.0)
MCV: 97.7 fL (ref 80.0–100.0)
Monocytes Absolute: 0.7 K/uL (ref 0.1–1.0)
Monocytes Relative: 15 %
Neutro Abs: 2.3 K/uL (ref 1.7–7.7)
Neutrophils Relative %: 52 %
Platelets: 118 K/uL — ABNORMAL LOW (ref 150–400)
RBC: 3.85 MIL/uL — ABNORMAL LOW (ref 3.87–5.11)
RDW: 15.9 % — ABNORMAL HIGH (ref 11.5–15.5)
WBC: 4.5 K/uL (ref 4.0–10.5)
nRBC: 0 % (ref 0.0–0.2)

## 2024-07-04 LAB — COMPREHENSIVE METABOLIC PANEL WITH GFR
ALT: 21 U/L (ref 0–44)
AST: 32 U/L (ref 15–41)
Albumin: 3.7 g/dL (ref 3.5–5.0)
Alkaline Phosphatase: 151 U/L — ABNORMAL HIGH (ref 38–126)
Anion gap: 13 (ref 5–15)
BUN: 12 mg/dL (ref 8–23)
CO2: 23 mmol/L (ref 22–32)
Calcium: 9 mg/dL (ref 8.9–10.3)
Chloride: 103 mmol/L (ref 98–111)
Creatinine, Ser: 0.59 mg/dL (ref 0.44–1.00)
GFR, Estimated: >60 mL/min (ref >60)
Glucose, Bld: 146 mg/dL — ABNORMAL HIGH (ref 70–99)
Potassium: 3.8 mmol/L (ref 3.5–5.1)
Sodium: 139 mmol/L (ref 135–145)
Total Bilirubin: 0.6 mg/dL (ref 0.0–1.2)
Total Protein: 7.4 g/dL (ref 6.5–8.1)

## 2024-07-04 LAB — PROTIME-INR
INR: 1.2 (ref 0.8–1.2)
Prothrombin Time: 15.6 s — ABNORMAL HIGH (ref 11.4–15.2)

## 2024-07-05 LAB — AFP TUMOR MARKER: AFP, Serum, Tumor Marker: 6.8 ng/mL (ref 0.0–9.2)

## 2024-07-12 ENCOUNTER — Ambulatory Visit (HOSPITAL_COMMUNITY)

## 2024-07-20 ENCOUNTER — Emergency Department (HOSPITAL_COMMUNITY)
Admission: EM | Admit: 2024-07-20 | Discharge: 2024-07-20 | Disposition: A | Discharge status: Home / Self Care | Attending: Emergency Medicine | Admitting: Emergency Medicine

## 2024-07-20 ENCOUNTER — Other Ambulatory Visit: Payer: Self-pay

## 2024-07-20 DIAGNOSIS — E119 Type 2 diabetes mellitus without complications: Secondary | ICD-10-CM | POA: Insufficient documentation

## 2024-07-20 DIAGNOSIS — Z7984 Long term (current) use of oral hypoglycemic drugs: Secondary | ICD-10-CM | POA: Diagnosis not present

## 2024-07-20 DIAGNOSIS — Z7951 Long term (current) use of inhaled steroids: Secondary | ICD-10-CM | POA: Insufficient documentation

## 2024-07-20 DIAGNOSIS — J449 Chronic obstructive pulmonary disease, unspecified: Secondary | ICD-10-CM | POA: Diagnosis not present

## 2024-07-20 DIAGNOSIS — Z79899 Other long term (current) drug therapy: Secondary | ICD-10-CM | POA: Diagnosis not present

## 2024-07-20 DIAGNOSIS — R040 Epistaxis: Secondary | ICD-10-CM | POA: Insufficient documentation

## 2024-07-20 DIAGNOSIS — G309 Alzheimer's disease, unspecified: Secondary | ICD-10-CM | POA: Insufficient documentation

## 2024-07-20 LAB — COMPREHENSIVE METABOLIC PANEL WITH GFR
ALT: 30 U/L (ref 0–44)
AST: 46 U/L — ABNORMAL HIGH (ref 15–41)
Albumin: 3.5 g/dL (ref 3.5–5.0)
Alkaline Phosphatase: 132 U/L — ABNORMAL HIGH (ref 38–126)
Anion gap: 14 (ref 5–15)
BUN: 22 mg/dL (ref 8–23)
CO2: 21 mmol/L — ABNORMAL LOW (ref 22–32)
Calcium: 9.7 mg/dL (ref 8.9–10.3)
Chloride: 98 mmol/L (ref 98–111)
Creatinine, Ser: 0.88 mg/dL (ref 0.44–1.00)
GFR, Estimated: >60 mL/min
Glucose, Bld: 197 mg/dL — ABNORMAL HIGH (ref 70–99)
Potassium: 4 mmol/L (ref 3.5–5.1)
Sodium: 132 mmol/L — ABNORMAL LOW (ref 135–145)
Total Bilirubin: 1 mg/dL (ref 0.0–1.2)
Total Protein: 7.1 g/dL (ref 6.5–8.1)

## 2024-07-20 LAB — CBC WITH DIFFERENTIAL/PLATELET
Abs Immature Granulocytes: 0.03 K/uL (ref 0.00–0.07)
Basophils Absolute: 0.1 K/uL (ref 0.0–0.1)
Basophils Relative: 1 %
Eosinophils Absolute: 0.2 K/uL (ref 0.0–0.5)
Eosinophils Relative: 2 %
HCT: 30.9 % — ABNORMAL LOW (ref 36.0–46.0)
Hemoglobin: 10.6 g/dL — ABNORMAL LOW (ref 12.0–15.0)
Immature Granulocytes: 0 %
Lymphocytes Relative: 30 %
Lymphs Abs: 2.4 K/uL (ref 0.7–4.0)
MCH: 33.7 pg (ref 26.0–34.0)
MCHC: 34.3 g/dL (ref 30.0–36.0)
MCV: 98.1 fL (ref 80.0–100.0)
Monocytes Absolute: 1 K/uL (ref 0.1–1.0)
Monocytes Relative: 13 %
Neutro Abs: 4.2 K/uL (ref 1.7–7.7)
Neutrophils Relative %: 54 %
Platelets: 156 K/uL (ref 150–400)
RBC: 3.15 MIL/uL — ABNORMAL LOW (ref 3.87–5.11)
RDW: 15.3 % (ref 11.5–15.5)
WBC: 7.8 K/uL (ref 4.0–10.5)
nRBC: 0 % (ref 0.0–0.2)

## 2024-07-20 LAB — PROTIME-INR
INR: 1.3 — ABNORMAL HIGH (ref 0.8–1.2)
Prothrombin Time: 16.7 s — ABNORMAL HIGH (ref 11.4–15.2)

## 2024-07-20 MED ORDER — LIDOCAINE HCL 4 % EX SOLN: Freq: Once | CUTANEOUS | Status: DC

## 2024-07-20 MED ORDER — OXYMETAZOLINE HCL 0.05 % NA SOLN
1.0000 | Freq: Once | NASAL | Status: AC
  Administered 2024-07-20: 1 via NASAL
  Filled 2024-07-20: qty 30

## 2024-07-20 NOTE — ED Triage Notes (Signed)
 Patient reports epistaxis at bilateral nares for 3 days , denies nasal injury , no anticoagulant medication .

## 2024-07-20 NOTE — ED Provider Notes (Signed)
 " [place] EMERGENCY DEPARTMENT AT [facility] Provider Note   CSN: [id number] Arrival date & time: 07/20/24  9381     Patient presents with: Epistaxis   [name] is a 68 y.o. female.   Patient complains of a left-sided nosebleed.  Patient reports it started last p.m.  Patient states she has a history of nosebleeds.  Patient states the last time that she had a nosebleed she required a transfusion she lost so much blood.  Patient has a past medical history of esophageal varices Alzheimer's COPD cirrhosis of the liver GERD type 2 diabetes and memory loss.  Patient states her nose has been bleeding since yesterday.  The history is provided by the patient. No language interpreter was used.  Epistaxis      Prior to Admission medications  Medication Sig Start Date End Date Taking? Authorizing Provider  albuterol  (PROVENTIL ) (2.5 MG/3ML) 0.083% nebulizer solution Take 3 mLs (2.5 mg total) by nebulization every 4 (four) hours as needed for wheezing. 01/09/23   Macario Dorothyann HERO, MD  albuterol  (VENTOLIN  HFA) [phone] Base) MCG/ACT inhaler Inhale 1-2 puffs into the lungs every 6 (six) hours as needed for wheezing. 01/09/23   Macario Dorothyann HERO, MD  atorvastatin  (LIPITOR) 10 MG tablet Take 1 tablet (10 mg total) by mouth daily. 10/14/23   Howell Lunger, DO  Calcium  Carbonate-Vitamin D  (CALCIUM -D PO) Take 1 tablet by mouth daily.    [provider]  DULoxetine  (CYMBALTA ) 60 MG capsule Take 1 capsule (60 mg total) by mouth daily. 03/08/24   Howell Lunger, DO  ferrous gluconate  [facility]) 324 MG tablet Take 1 tablet (324 mg total) by mouth daily with breakfast. 10/21/22   Macario Dorothyann HERO, MD  furosemide  (LASIX ) 40 MG tablet Take 1 tablet (40 mg total) by mouth daily. Get your blood work checked 2-3 times per year on this medication. 06/23/23   Howell Lunger, DO  gabapentin  (NEURONTIN ) 800 MG tablet TAKE ONE (1) TABLET BY MOUTH TWO (2) TIMES DAILY 10/14/23   Howell Lunger, DO   JARDIANCE  25 MG TABS tablet TAKE ONE (1) TABLET BY MOUTH EVERY DAY 02/16/24   Howell Lunger, DO  linaclotide  (LINZESS ) 290 MCG CAPS capsule Take 1 capsule (290 mcg total) by mouth daily before breakfast. 11/27/23 03/03/24  [place], PA-C  metFORMIN  (GLUCOPHAGE ) 1000 MG tablet TAKE 1 TABLET BY MOUTH TWICE DAILY WITH MEALS 10/14/23   Howell Lunger, DO  methocarbamol  (ROBAXIN ) 500 MG tablet TAKE 1 TABLET BY MOUTH TWICE DAILY AS NEEDED FOR MUSCLE SPASMS 04/21/24   Howell Lunger, DO  Multiple Vitamin (MULTIVITAMIN WITH MINERALS) TABS tablet Take 1 tablet by mouth daily.    [provider]  naloxone [facility]) nasal spray 4 mg/0.1 mL Place 4 mg into the nose as needed (opioid overdose). 04/04/22   [provider]  oxyCODONE  (OXY IR/ROXICODONE ) 5 MG immediate release tablet Take 1 tablet (5 mg total) by mouth every 4 (four) hours as needed for moderate pain (pain score 4-6). 03/08/24   Howell Lunger, DO  pantoprazole  (PROTONIX ) 40 MG tablet TAKE ONE (1) TABLET BY MOUTH EVERY DAY 05/16/24   Howell Lunger, DO  Rectal Protectant-Emollient (CALMOL-4) 76-10 % SUPP Use as directed once to twice daily 01/29/24   Armbruster, Elspeth SQUIBB, MD  senna (SENOKOT) 8.6 MG TABS tablet Take 1 tablet by mouth daily.    [provider]  spironolactone  (ALDACTONE ) 100 MG tablet TAKE ONE (1) TABLET BY MOUTH EVERY DAY 06/22/23   Howell Lunger,  DO    Allergies: Hydrocodone -acetaminophen  and Pregabalin     Review of Systems  HENT:  Positive for nosebleeds.   All other systems reviewed and are negative.   Updated Vital Signs BP 127/72 (BP Location: Right Arm)   Pulse (!) 114   Temp 97.6 F (36.4 C) (Oral)   Resp (!) 23   SpO2 96%   Physical Exam Vitals reviewed.  HENT:     Head: Normocephalic.     Nose:     Comments: On initial exam patient is not actively bleeding.  Patient continuously sticks gauze upper nose, dabs and moves her nose. Cardiovascular:     Rate and Rhythm: Normal  rate.  Pulmonary:     Effort: Pulmonary effort is normal.  Musculoskeletal:     Cervical back: Normal range of motion.  Neurological:     Mental Status: She is alert.     (all labs ordered are listed, but only abnormal results are displayed) Labs Reviewed  CBC WITH DIFFERENTIAL/PLATELET - Abnormal; Notable for the following components:      Result Value   RBC 3.15 (*)    Hemoglobin 10.6 (*)    HCT 30.9 (*)    All other components within normal limits  COMPREHENSIVE METABOLIC PANEL WITH GFR - Abnormal; Notable for the following components:   Sodium 132 (*)    CO2 21 (*)    Glucose, Bld 197 (*)    AST 46 (*)    Alkaline Phosphatase 132 (*)    All other components within normal limits  PROTIME-INR - Abnormal; Notable for the following components:   Prothrombin Time 16.7 (*)    INR 1.3 (*)    All other components within normal limits    EKG: None  Radiology: No results found.   Epistaxis Management  Date/Time: 07/20/2024 11:33 AM  Performed by: Flint Sonny POUR, PA-C Authorized by: Flint Sonny POUR, PA-C   Consent:    Consent obtained:  Verbal   Consent given by:  Patient   Risks, benefits, and alternatives were discussed: yes     Risks discussed:  Bleeding Universal protocol:    Procedure explained and questions answered to patient or proxy's satisfaction: yes     Immediately prior to procedure, a time out was called: yes     Patient identity confirmed:  Verbally with patient Anesthesia:    Anesthesia method:  Topical application   Topical medication group details: 4%lidocaine . Procedure details:    Treatment site:  L anterior   Treatment method:  Anterior pack Post-procedure details:    Assessment:  No improvement   Procedure completion:  Tolerated Comments:     Rhino Rocket placed.  Patient is advised to follow-up with urgent care for packing removal in 24 hours.  Patient advised to return if any problems.    Medications Ordered in the ED  lidocaine   (XYLOCAINE ) 4 % external solution (has no administration in time range)  oxymetazoline (AFRIN) 0.05 % nasal spray 1 spray (1 spray Each Nare Given 07/20/24 0937)                                    Medical Decision Making Patient complains of a left-sided nosebleed.  She states the bleeding stopped last night.  Patient is concerned that she might need a blood transfusion because she has had to have blood transfusions in the past secondary to nosebleeds.  Amount and/or Complexity  of Data Reviewed Labs: ordered. Decision-making details documented in ED Course.    Details: Labs ordered reviewed and interpreted.  Patient's INR is 1.3.  Hemoglobin is 10.6 hematocrit is 30.9.  Patient's baseline appears to be 11.  Risk OTC drugs. Prescription drug management. Risk Details: Patient continues to dab at her nose causing continued bleeding.        Final diagnoses:  Right-sided epistaxis    ED Discharge Orders     None       An After Visit Summary was printed and given to the patient.    Flint Sonny POUR, PA-C 07/20/24 1135    Bernard Drivers, MD 07/20/24 [phone]  "

## 2024-07-20 NOTE — Discharge Instructions (Signed)
 Follow up at Urgent care tomorrow am for packing removal

## 2024-07-21 ENCOUNTER — Other Ambulatory Visit: Payer: Self-pay

## 2024-07-21 ENCOUNTER — Encounter (HOSPITAL_COMMUNITY): Payer: Self-pay | Admitting: Emergency Medicine

## 2024-07-21 ENCOUNTER — Emergency Department (HOSPITAL_COMMUNITY)
Admission: EM | Admit: 2024-07-21 | Discharge: 2024-07-21 | Disposition: A | Discharge status: Home / Self Care | Source: Ambulatory Visit | Attending: Emergency Medicine | Admitting: Emergency Medicine

## 2024-07-21 DIAGNOSIS — R040 Epistaxis: Secondary | ICD-10-CM | POA: Insufficient documentation

## 2024-07-21 NOTE — Discharge Instructions (Signed)
 Follow-up in the emergency department either Friday morning or Monday morning or Tuesday morning to get the packing out or you can follow-up in the office with Dr. Llewellyn or one of her partners

## 2024-07-21 NOTE — ED Triage Notes (Signed)
 Pov c/o having a balloon catheter, r/t nosebleed yesterday, removed. Catheter is in the left nare.

## 2024-07-22 NOTE — ED Provider Notes (Signed)
 " [place] EMERGENCY DEPARTMENT AT [facility] Provider Note   CSN: [id number] Arrival date & time: 07/21/24  1845     Patient presents with: Nasal Catheter Removal   [name] is a 69 y.o. female.   Patient had nosebleed yesterday and had her nose packed.  She returns today to have the packing removed  The history is provided by the patient and medical records. No language interpreter was used.  Epistaxis Location:  L nare Severity:  Mild Timing:  Rare Progression:  Resolved Chronicity:  New Context: bleeding disorder   Context: not anticoagulants   Relieved by:  Nothing Worsened by:  Nothing Associated symptoms: no congestion, no cough and no headaches        Prior to Admission medications  Medication Sig Start Date End Date Taking? Authorizing Provider  albuterol  (PROVENTIL ) (2.5 MG/3ML) 0.083% nebulizer solution Take 3 mLs (2.5 mg total) by nebulization every 4 (four) hours as needed for wheezing. 01/09/23   Macario Dorothyann HERO, MD  albuterol  (VENTOLIN  HFA) [phone] Base) MCG/ACT inhaler Inhale 1-2 puffs into the lungs every 6 (six) hours as needed for wheezing. 01/09/23   Macario Dorothyann HERO, MD  atorvastatin  (LIPITOR) 10 MG tablet Take 1 tablet (10 mg total) by mouth daily. 10/14/23   Howell Lunger, DO  Calcium  Carbonate-Vitamin D  (CALCIUM -D PO) Take 1 tablet by mouth daily.    [provider]  DULoxetine  (CYMBALTA ) 60 MG capsule Take 1 capsule (60 mg total) by mouth daily. 03/08/24   Howell Lunger, DO  ferrous gluconate  [facility]) 324 MG tablet Take 1 tablet (324 mg total) by mouth daily with breakfast. 10/21/22   Macario Dorothyann HERO, MD  furosemide  (LASIX ) 40 MG tablet Take 1 tablet (40 mg total) by mouth daily. Get your blood work checked 2-3 times per year on this medication. 06/23/23   Howell Lunger, DO  gabapentin  (NEURONTIN ) 800 MG tablet TAKE ONE (1) TABLET BY MOUTH TWO (2) TIMES DAILY 10/14/23   Howell Lunger, DO  JARDIANCE  25 MG TABS tablet TAKE  ONE (1) TABLET BY MOUTH EVERY DAY 02/16/24   Howell Lunger, DO  linaclotide  (LINZESS ) 290 MCG CAPS capsule Take 1 capsule (290 mcg total) by mouth daily before breakfast. 11/27/23 03/03/24  [place], PA-C  metFORMIN  (GLUCOPHAGE ) 1000 MG tablet TAKE 1 TABLET BY MOUTH TWICE DAILY WITH MEALS 10/14/23   Howell Lunger, DO  methocarbamol  (ROBAXIN ) 500 MG tablet TAKE 1 TABLET BY MOUTH TWICE DAILY AS NEEDED FOR MUSCLE SPASMS 04/21/24   Howell Lunger, DO  Multiple Vitamin (MULTIVITAMIN WITH MINERALS) TABS tablet Take 1 tablet by mouth daily.    [provider]  naloxone [facility]) nasal spray 4 mg/0.1 mL Place 4 mg into the nose as needed (opioid overdose). 04/04/22   [provider]  oxyCODONE  (OXY IR/ROXICODONE ) 5 MG immediate release tablet Take 1 tablet (5 mg total) by mouth every 4 (four) hours as needed for moderate pain (pain score 4-6). 03/08/24   Howell Lunger, DO  pantoprazole  (PROTONIX ) 40 MG tablet TAKE ONE (1) TABLET BY MOUTH EVERY DAY 05/16/24   Howell Lunger, DO  Rectal Protectant-Emollient (CALMOL-4) 76-10 % SUPP Use as directed once to twice daily 01/29/24   Armbruster, Elspeth SQUIBB, MD  senna (SENOKOT) 8.6 MG TABS tablet Take 1 tablet by mouth daily.    [provider]  spironolactone  (ALDACTONE ) 100 MG tablet TAKE ONE (1) TABLET BY MOUTH EVERY DAY 06/22/23   Howell Lunger, DO    Allergies: Hydrocodone -acetaminophen  and  Pregabalin     Review of Systems  Constitutional:  Negative for appetite change and fatigue.  HENT:  Positive for nosebleeds. Negative for congestion, ear discharge and sinus pressure.   Eyes:  Negative for discharge.  Respiratory:  Negative for cough.   Cardiovascular:  Negative for chest pain.  Gastrointestinal:  Negative for abdominal pain and diarrhea.  Genitourinary:  Negative for frequency and hematuria.  Musculoskeletal:  Negative for back pain.  Skin:  Negative for rash.  Neurological:  Negative for seizures and headaches.   Psychiatric/Behavioral:  Negative for hallucinations.     Updated Vital Signs BP (!) 141/61 (BP Location: Right Arm)   Pulse 95   Temp 98.6 F (37 C) (Oral)   Resp 20   Ht 5' 4 (1.626 m)   Wt 89.4 kg   SpO2 100%   BMI 33.81 kg/m   Physical Exam Vitals and nursing note reviewed.  Constitutional:      Appearance: She is well-developed.  HENT:     Head: Normocephalic.     Nose:     Comments: Packing in left nostril Eyes:     General: No scleral icterus.    Conjunctiva/sclera: Conjunctivae normal.  Neck:     Thyroid : No thyromegaly.  Cardiovascular:     Rate and Rhythm: Normal rate and regular rhythm.     Heart sounds: No murmur heard.    No friction rub. No gallop.  Pulmonary:     Breath sounds: No stridor. No wheezing or rales.  Chest:     Chest wall: No tenderness.  Abdominal:     General: There is no distension.     Tenderness: There is no abdominal tenderness. There is no rebound.  Musculoskeletal:        General: Normal range of motion.     Cervical back: Neck supple.  Lymphadenopathy:     Cervical: No cervical adenopathy.  Skin:    Findings: No erythema or rash.  Neurological:     Mental Status: She is alert and oriented to person, place, and time.     Motor: No abnormal muscle tone.     Coordination: Coordination normal.  Psychiatric:        Behavior: Behavior normal.     (all labs ordered are listed, but only abnormal results are displayed) Labs Reviewed - No data to display  EKG: None  Radiology: No results found.   Procedures   Medications Ordered in the ED - No data to display                                  Medical Decision Making  Patient with packing keeping her left nostril from bleeding.  I told the patient that the packing should not be taken out today it needs to stay in at least another 24 hours.  She will return at a more convenient time to have her packing removed     Final diagnoses:  None    ED Discharge Orders      None          Suzette Pac, MD 07/22/24 1220  "

## 2024-07-25 ENCOUNTER — Encounter (HOSPITAL_COMMUNITY): Payer: Self-pay | Admitting: *Deleted

## 2024-07-25 ENCOUNTER — Other Ambulatory Visit: Payer: Self-pay

## 2024-07-25 ENCOUNTER — Emergency Department (HOSPITAL_COMMUNITY)
Admission: EM | Admit: 2024-07-25 | Discharge: 2024-07-25 | Disposition: A | Discharge status: Home / Self Care | Attending: Emergency Medicine | Admitting: Emergency Medicine

## 2024-07-25 DIAGNOSIS — Z4800 Encounter for change or removal of nonsurgical wound dressing: Secondary | ICD-10-CM | POA: Insufficient documentation

## 2024-07-25 DIAGNOSIS — E119 Type 2 diabetes mellitus without complications: Secondary | ICD-10-CM | POA: Diagnosis not present

## 2024-07-25 DIAGNOSIS — J449 Chronic obstructive pulmonary disease, unspecified: Secondary | ICD-10-CM | POA: Insufficient documentation

## 2024-07-25 NOTE — ED Provider Notes (Signed)
 " [place] EMERGENCY DEPARTMENT AT [facility] Provider Note   CSN: [id number] Arrival date & time: 07/25/24  9097     Patient presents with: Epistaxis   [name] is a 69 y.o. female.   Patient with history of COPD, diabetes, GERD, hypertension today requesting to have her nasal packing removed.  Reports that this was placed for epistaxis on 12/31.  She was unable to get into see ENT and therefore presents to have the packing removed.  Denies any residual epistaxis.  Denies any other complaints at this time.  The history is provided by the patient. No language interpreter was used.  Epistaxis      Prior to Admission medications  Medication Sig Start Date End Date Taking? Authorizing Provider  albuterol  (PROVENTIL ) (2.5 MG/3ML) 0.083% nebulizer solution Take 3 mLs (2.5 mg total) by nebulization every 4 (four) hours as needed for wheezing. 01/09/23   Macario Dorothyann HERO, MD  albuterol  (VENTOLIN  HFA) [phone] Base) MCG/ACT inhaler Inhale 1-2 puffs into the lungs every 6 (six) hours as needed for wheezing. 01/09/23   Macario Dorothyann HERO, MD  atorvastatin  (LIPITOR) 10 MG tablet Take 1 tablet (10 mg total) by mouth daily. 10/14/23   Howell Lunger, DO  Calcium  Carbonate-Vitamin D  (CALCIUM -D PO) Take 1 tablet by mouth daily.    [provider]  DULoxetine  (CYMBALTA ) 60 MG capsule Take 1 capsule (60 mg total) by mouth daily. 03/08/24   Howell Lunger, DO  ferrous gluconate  [facility]) 324 MG tablet Take 1 tablet (324 mg total) by mouth daily with breakfast. 10/21/22   Macario Dorothyann HERO, MD  furosemide  (LASIX ) 40 MG tablet Take 1 tablet (40 mg total) by mouth daily. Get your blood work checked 2-3 times per year on this medication. 06/23/23   Howell Lunger, DO  gabapentin  (NEURONTIN ) 800 MG tablet TAKE ONE (1) TABLET BY MOUTH TWO (2) TIMES DAILY 10/14/23   Howell Lunger, DO  JARDIANCE  25 MG TABS tablet TAKE ONE (1) TABLET BY MOUTH EVERY DAY 02/16/24   Howell Lunger, DO   linaclotide  (LINZESS ) 290 MCG CAPS capsule Take 1 capsule (290 mcg total) by mouth daily before breakfast. 11/27/23 03/03/24  [place], PA-C  metFORMIN  (GLUCOPHAGE ) 1000 MG tablet TAKE 1 TABLET BY MOUTH TWICE DAILY WITH MEALS 10/14/23   Howell Lunger, DO  methocarbamol  (ROBAXIN ) 500 MG tablet TAKE 1 TABLET BY MOUTH TWICE DAILY AS NEEDED FOR MUSCLE SPASMS 04/21/24   Howell Lunger, DO  Multiple Vitamin (MULTIVITAMIN WITH MINERALS) TABS tablet Take 1 tablet by mouth daily.    [provider]  naloxone [facility]) nasal spray 4 mg/0.1 mL Place 4 mg into the nose as needed (opioid overdose). 04/04/22   [provider]  oxyCODONE  (OXY IR/ROXICODONE ) 5 MG immediate release tablet Take 1 tablet (5 mg total) by mouth every 4 (four) hours as needed for moderate pain (pain score 4-6). 03/08/24   Howell Lunger, DO  pantoprazole  (PROTONIX ) 40 MG tablet TAKE ONE (1) TABLET BY MOUTH EVERY DAY 05/16/24   Howell Lunger, DO  Rectal Protectant-Emollient (CALMOL-4) 76-10 % SUPP Use as directed once to twice daily 01/29/24   Armbruster, Elspeth SQUIBB, MD  senna (SENOKOT) 8.6 MG TABS tablet Take 1 tablet by mouth daily.    [provider]  spironolactone  (ALDACTONE ) 100 MG tablet TAKE ONE (1) TABLET BY MOUTH EVERY DAY 06/22/23   Howell Lunger, DO    Allergies: Hydrocodone -acetaminophen  and Pregabalin     Review of Systems  HENT:  Positive for  nosebleeds (none currently).   All other systems reviewed and are negative.   Updated Vital Signs BP 130/73 (BP Location: Right Arm)   Pulse 90   Temp 98 F (36.7 C) (Oral)   Resp 20   Ht 5' 4 (1.626 m)   Wt 89.4 kg   SpO2 100%   BMI 33.82 kg/m   Physical Exam Vitals and nursing note reviewed.  Constitutional:      General: She is not in acute distress.    Appearance: Normal appearance. She is normal weight. She is not ill-appearing, toxic-appearing or diaphoretic.  HENT:     Head: Normocephalic and atraumatic.     Nose:     Comments:  Nasal packing present in the left nare. No bleeding present    Mouth/Throat:     Comments: No bleeding in the oropharynx Cardiovascular:     Rate and Rhythm: Normal rate.  Pulmonary:     Effort: Pulmonary effort is normal. No respiratory distress.  Musculoskeletal:        General: Normal range of motion.     Cervical back: Normal range of motion.  Skin:    General: Skin is warm and dry.  Neurological:     General: No focal deficit present.     Mental Status: She is alert.  Psychiatric:        Mood and Affect: Mood normal.        Behavior: Behavior normal.     (all labs ordered are listed, but only abnormal results are displayed) Labs Reviewed - No data to display  EKG: None  Radiology: No results found.   .Foreign Body Removal  Date/Time: 07/25/2024 12:16 PM  Performed by: Nora Lauraine LABOR, PA-C Authorized by: Raenell Mensing A, PA-C  Consent: Verbal consent obtained Risks and benefits: risks, benefits and alternatives were discussed Consent given by: patient Patient identity confirmed: verbally with patient Body area: nose Location details: left nostril Localization method: visualized Complexity: simple 1 objects recovered. Objects recovered: nasal packing Post-procedure assessment: foreign body removed Patient tolerance: patient tolerated the procedure well with no immediate complications     Medications Ordered in the ED - No data to display                                  Medical Decision Making  Patient presents today requesting to have her nasal packing removed.  This was placed on 12/31.  Reports she has not had any residual bleeding since this event.  She is afebrile, nontoxic-appearing, and in no acute distress reassuring vital signs.  Physical exam reveals nasal packing located in the left nare.  No bleeding present.  No bleeding in the oropharynx.  Chart reviewed, nasal packing was placed on 12/31 at this facility.  Patient was unable to follow-up with  ENT.  Given timeframe since packing placement, packing was removed per above procedure which was well-tolerated without any residual bleeding. Evaluation and diagnostic testing in the emergency department does not suggest an emergent condition requiring admission or immediate intervention beyond what has been performed at this time.  Plan for discharge with close PCP follow-up.  Patient is understanding and amenable with plan, educated on red flag symptoms that would prompt immediate return.  Patient discharged in stable condition.  Final diagnoses:  Encounter for removal of nasal packing    ED Discharge Orders     None     An After Visit  Summary was printed and given to the patient.      Bomani Oommen A, PA-C 07/25/24 1219    Suzette Pac, MD 07/26/24 1659  "

## 2024-07-25 NOTE — ED Triage Notes (Signed)
 Pt states she was told to come back today to have her nasal packing removed from her left nare

## 2024-07-25 NOTE — ED Notes (Signed)
 Pt discharged with her sister, Shanda Peers.   Pt provided discharge instructions and prescription information. Pt was given the opportunity to ask questions and questions were answered.

## 2024-07-25 NOTE — Discharge Instructions (Signed)
 Your nasal packing was removed today without complication.  Please be careful with blowing your nose over the next few days to help prevent recurrent bleeding.  Please follow-up with ENT.  Return if development of any new or worsening symptoms.

## 2024-08-02 ENCOUNTER — Other Ambulatory Visit: Payer: Self-pay | Admitting: Student

## 2024-08-02 ENCOUNTER — Other Ambulatory Visit: Payer: Self-pay | Admitting: Family Medicine

## 2024-08-17 ENCOUNTER — Telehealth: Payer: Self-pay

## 2024-08-17 DIAGNOSIS — K7460 Unspecified cirrhosis of liver: Secondary | ICD-10-CM

## 2024-08-17 DIAGNOSIS — I8500 Esophageal varices without bleeding: Secondary | ICD-10-CM

## 2024-08-17 NOTE — Telephone Encounter (Signed)
 Instructions for EGD on 09-05-24 mailed to the patient and sent to MyChart

## 2024-08-17 NOTE — Telephone Encounter (Signed)
-----   Message from [facility] Clarita H sent at 06/29/2024  4:32 PM EST ----- Regarding: Mail instructions for EGD at [facility] on 09-05-24 Mail instructions for EGD at [facility] on 09-05-24 to patient.  She did not want a PV as she has had an EGD before and lives in [place].

## 2024-08-22 ENCOUNTER — Encounter

## 2024-08-23 ENCOUNTER — Encounter (HOSPITAL_COMMUNITY): Payer: Self-pay | Admitting: Gastroenterology

## 2024-08-23 NOTE — Progress Notes (Signed)
 Attempted to obtain medical history for pre op call via telephone, unable to reach at this time. HIPAA compliant voicemail message left requesting return call to pre surgical testing department.

## 2024-09-05 ENCOUNTER — Ambulatory Visit (HOSPITAL_COMMUNITY): Admit: 2024-09-05 | Admitting: Gastroenterology

## 2024-09-05 ENCOUNTER — Encounter (HOSPITAL_COMMUNITY): Payer: Self-pay
